# Patient Record
Sex: Male | Born: 1935 | Race: White | Hispanic: No | Smoking: Former smoker
Health system: Southern US, Community
[De-identification: ages and names within clinical notes are randomized; demographics above are authoritative.]

## PROBLEM LIST (undated history)

## (undated) DIAGNOSIS — I5042 Chronic combined systolic (congestive) and diastolic (congestive) heart failure: Secondary | ICD-10-CM

## (undated) DIAGNOSIS — T8859XA Other complications of anesthesia, initial encounter: Secondary | ICD-10-CM

## (undated) DIAGNOSIS — F419 Anxiety disorder, unspecified: Secondary | ICD-10-CM

## (undated) DIAGNOSIS — I779 Disorder of arteries and arterioles, unspecified: Secondary | ICD-10-CM

## (undated) DIAGNOSIS — J189 Pneumonia, unspecified organism: Secondary | ICD-10-CM

## (undated) DIAGNOSIS — G518 Other disorders of facial nerve: Secondary | ICD-10-CM

## (undated) DIAGNOSIS — M1990 Unspecified osteoarthritis, unspecified site: Secondary | ICD-10-CM

## (undated) DIAGNOSIS — C4490 Unspecified malignant neoplasm of skin, unspecified: Secondary | ICD-10-CM

## (undated) DIAGNOSIS — I739 Peripheral vascular disease, unspecified: Secondary | ICD-10-CM

## (undated) DIAGNOSIS — I255 Ischemic cardiomyopathy: Secondary | ICD-10-CM

## (undated) DIAGNOSIS — I219 Acute myocardial infarction, unspecified: Secondary | ICD-10-CM

## (undated) DIAGNOSIS — C4331 Malignant melanoma of nose: Secondary | ICD-10-CM

## (undated) DIAGNOSIS — T4145XA Adverse effect of unspecified anesthetic, initial encounter: Secondary | ICD-10-CM

## (undated) DIAGNOSIS — A770 Spotted fever due to Rickettsia rickettsii: Secondary | ICD-10-CM

## (undated) DIAGNOSIS — E119 Type 2 diabetes mellitus without complications: Secondary | ICD-10-CM

## (undated) DIAGNOSIS — Z9289 Personal history of other medical treatment: Secondary | ICD-10-CM

## (undated) DIAGNOSIS — I10 Essential (primary) hypertension: Secondary | ICD-10-CM

## (undated) DIAGNOSIS — I2510 Atherosclerotic heart disease of native coronary artery without angina pectoris: Secondary | ICD-10-CM

## (undated) DIAGNOSIS — E785 Hyperlipidemia, unspecified: Secondary | ICD-10-CM

## (undated) HISTORY — PX: BACK SURGERY: SHX140

## (undated) HISTORY — PX: APPENDECTOMY: SHX54

## (undated) HISTORY — PX: CATARACT EXTRACTION, BILATERAL: SHX1313

## (undated) HISTORY — PX: MELANOMA EXCISION: SHX5266

## (undated) HISTORY — DX: Hyperlipidemia, unspecified: E78.5

## (undated) HISTORY — PX: JOINT REPLACEMENT: SHX530

## (undated) HISTORY — PX: TONSILLECTOMY: SUR1361

## (undated) HISTORY — PX: CYSTOSCOPY W/ URETERAL STENT PLACEMENT: SHX1429

---
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
## 1990-12-29 HISTORY — PX: LUMBAR DISC SURGERY: SHX700

## 1998-12-29 HISTORY — PX: CORONARY ANGIOPLASTY WITH STENT PLACEMENT: SHX49

## 1999-05-12 ENCOUNTER — Inpatient Hospital Stay (HOSPITAL_COMMUNITY): Admission: EM | Admit: 1999-05-12 | Discharge: 1999-05-15 | Payer: Self-pay | Admitting: Cardiology

## 2002-01-15 ENCOUNTER — Emergency Department (HOSPITAL_COMMUNITY): Admission: EM | Admit: 2002-01-15 | Discharge: 2002-01-16 | Payer: Self-pay | Admitting: Internal Medicine

## 2002-01-15 ENCOUNTER — Encounter: Payer: Self-pay | Admitting: Internal Medicine

## 2002-01-20 ENCOUNTER — Encounter: Payer: Self-pay | Admitting: Internal Medicine

## 2002-01-20 ENCOUNTER — Ambulatory Visit (HOSPITAL_COMMUNITY): Admission: RE | Admit: 2002-01-20 | Discharge: 2002-01-20 | Payer: Self-pay | Admitting: Internal Medicine

## 2002-11-21 ENCOUNTER — Encounter (HOSPITAL_COMMUNITY): Admission: RE | Admit: 2002-11-21 | Discharge: 2002-12-21 | Payer: Self-pay | Admitting: Cardiology

## 2003-04-22 ENCOUNTER — Encounter: Payer: Self-pay | Admitting: Emergency Medicine

## 2003-04-22 ENCOUNTER — Emergency Department (HOSPITAL_COMMUNITY): Admission: EM | Admit: 2003-04-22 | Discharge: 2003-04-22 | Payer: Self-pay | Admitting: Emergency Medicine

## 2003-05-01 ENCOUNTER — Encounter: Payer: Self-pay | Admitting: Emergency Medicine

## 2003-05-01 ENCOUNTER — Emergency Department (HOSPITAL_COMMUNITY): Admission: EM | Admit: 2003-05-01 | Discharge: 2003-05-01 | Payer: Self-pay | Admitting: Emergency Medicine

## 2003-05-04 ENCOUNTER — Encounter: Payer: Self-pay | Admitting: Urology

## 2003-05-04 ENCOUNTER — Observation Stay (HOSPITAL_COMMUNITY): Admission: RE | Admit: 2003-05-04 | Discharge: 2003-05-05 | Payer: Self-pay | Admitting: Urology

## 2003-07-12 ENCOUNTER — Ambulatory Visit (HOSPITAL_COMMUNITY): Admission: RE | Admit: 2003-07-12 | Discharge: 2003-07-12 | Payer: Self-pay | Admitting: Urology

## 2003-07-12 ENCOUNTER — Encounter: Payer: Self-pay | Admitting: Urology

## 2004-10-01 ENCOUNTER — Ambulatory Visit (HOSPITAL_COMMUNITY): Admission: RE | Admit: 2004-10-01 | Discharge: 2004-10-01 | Payer: Self-pay | Admitting: Urology

## 2005-05-05 ENCOUNTER — Ambulatory Visit: Payer: Self-pay | Admitting: Internal Medicine

## 2005-05-05 ENCOUNTER — Ambulatory Visit (HOSPITAL_COMMUNITY): Admission: RE | Admit: 2005-05-05 | Discharge: 2005-05-05 | Payer: Self-pay | Admitting: Internal Medicine

## 2005-09-25 ENCOUNTER — Ambulatory Visit: Payer: Self-pay | Admitting: Cardiology

## 2005-10-03 ENCOUNTER — Ambulatory Visit: Payer: Self-pay | Admitting: Cardiology

## 2005-10-03 ENCOUNTER — Ambulatory Visit (HOSPITAL_COMMUNITY): Admission: RE | Admit: 2005-10-03 | Discharge: 2005-10-03 | Payer: Self-pay | Admitting: Cardiology

## 2005-10-16 ENCOUNTER — Ambulatory Visit: Payer: Self-pay | Admitting: Cardiology

## 2006-04-02 ENCOUNTER — Ambulatory Visit (HOSPITAL_COMMUNITY): Admission: RE | Admit: 2006-04-02 | Discharge: 2006-04-02 | Payer: Self-pay | Admitting: Internal Medicine

## 2007-03-30 ENCOUNTER — Ambulatory Visit (HOSPITAL_COMMUNITY): Admission: RE | Admit: 2007-03-30 | Discharge: 2007-03-30 | Payer: Self-pay | Admitting: Internal Medicine

## 2009-04-10 ENCOUNTER — Ambulatory Visit (HOSPITAL_COMMUNITY): Admission: RE | Admit: 2009-04-10 | Discharge: 2009-04-10 | Payer: Self-pay | Admitting: Internal Medicine

## 2009-10-11 ENCOUNTER — Emergency Department (HOSPITAL_COMMUNITY): Admission: EM | Admit: 2009-10-11 | Discharge: 2009-10-11 | Payer: Self-pay | Admitting: Emergency Medicine

## 2010-04-15 ENCOUNTER — Ambulatory Visit (HOSPITAL_COMMUNITY): Admission: RE | Admit: 2010-04-15 | Discharge: 2010-04-15 | Payer: Self-pay | Admitting: Internal Medicine

## 2011-05-16 NOTE — Op Note (Signed)
NAME:  Jimmy Knox, Jimmy Knox                       ACCOUNT NO.:  [id number]   MEDICAL RECORD NO.:  [id number]                   PATIENT TYPE:  OBV   LOCATION:  A338                                 FACILITY:  [facility]   PHYSICIAN:  Dennie Maizes, M.D.                DATE OF BIRTH:  04-13-1932   DATE OF PROCEDURE:  DATE OF DISCHARGE:                                 OPERATIVE REPORT   PREOPERATIVE DIAGNOSES:  Right distal ureteral calculus with obstruction,  right hydronephrosis, right renal colic.   POSTOPERATIVE DIAGNOSES:  Right distal ureteral calculus with obstruction,  right hydronephrosis, right renal colic.   PROCEDURE:  1. Cystoscopy.  2. Right retrograde pyelogram.  3. Right ureteroscopic stone extraction.  4. Right ureteral stent placement.   SURGEON:  Dennie Maizes, M.D.   ANESTHESIA:  Spinal   COMPLICATIONS:  None   DRAINS:  6 [place], 26-cm size right ureteral stent with a string; 16 French  Foley catheter in the bladder.   INDICATIONS FOR PROCEDURE:  This 77 year old male has a history of recurrent  urolithiasis.  He had severe right flank pain due to an obstructing 4-mm  size right ureteral calculus.  He was taken to the OR today for cystoscopy,  right retrograde pyelogram, ureteroscopic stone extraction, and ureteral  stent placement.   DESCRIPTION OF PROCEDURE:  Spinal anesthesia was induced and the patient was  placed on the OR table in the dorsal lithotomy position.  The lower abdomen  and genitalia were prepped and draped in a sterile fashion.  Cystoscopy was  done with the 25 [place] scope.  The patient had moderate hypertrophy  involving both lateral lobes of the prostate with partial obstruction of the  bladder neck area.  The bladder was normal.  The trigone, ureteral orifices,  and bladder mucosa were unremarkable.   A 5 French wedge catheter was then placed in the right ureteral orifice.  About 7 cc of Renografin 60 was injected into the collecting  system and a  retrograde pyelogram was done with fluoroscopy.  This revealed a filling  defect about 5 mm in size in the distal ureter.  The ureter as well as  pelvicalyceal system were found to be moderately dilated.   A 5 French open-ended catheter was then placed in the right ureteral  orifice.  A 0.038-inch Benson guidewire with the flexible tip was then  advanced into the renal pelvis.  The distal ureter was then dilated using an  40 French balloon dilating catheter.  The guidewire was left in place and  the catheter was removed.   A rigid 8 French ureteroscope was then inserted into the distal ureter.  The  stone was seen at about 2 cm above the ureteral orifice.  The stone was  extracted with a 4-mm size wire basket. During this process the guidewire  came out of the ureter.  I had difficulty in placing the guidewire  into the  renal pelvis.  The guidewire was placed up to the midureter.  A 4-to-5-cm  size ureteral access sheath was then inserted.  A flexible 7 French side  ureteroscope was then inserted into the midureter.  The guidewire was then  placed into the renal pelvis under direct vision.   The ureteroscope was then removed.  A 5 French open-ended catheter was then  passed over the guidewire into the renal pelvis.  Contrast was injected and  it was confirmed that the tip of the catheter was inside the renal pelvis.  The ureteral access sheath was then removed.  The guidewire was then  repositioned into the right renal pelvis.  A 6 French 26-cm size stent with  a string was then inserted into the right collecting system.  A 16 French  Foley catheter was inserted into the bladder and connected to a bag. The  patient tolerated the procedure well.  He was transferred to the PACU in a  satisfactory condition.                                               Dennie Maizes, M.D.    SK/MEDQ  D:  05/04/2003  T:  05/04/2003  Job:  [id number]   cc:   Kingsley Callander. Ouida Sills, M.D.  [address]  [place]  [place] [address]  Fax: [phone]

## 2011-05-16 NOTE — Procedures (Signed)
   NAME:  Jimmy Knox, Jimmy Knox NO.:  [id number]   MEDICAL RECORD NO.:  [id number]                   PATIENT TYPE:   LOCATION:                                       FACILITY:   PHYSICIAN:  Llano Bing, M.D. [facility]           DATE OF BIRTH:   DATE OF PROCEDURE:  11/21/2002  DATE OF DISCHARGE:                                  ECHOCARDIOGRAM   STRESS ECHOCARDIOGRAM:   CLINICAL DATA:  This 77 year old gentleman with --   Dictation ended at this point.                                               Forsyth Bing, M.D. [facility]    RR/MEDQ  D:  11/22/2002  T:  11/22/2002  Job:  [id number]

## 2011-05-16 NOTE — Procedures (Signed)
NAME:  Jimmy Knox, Jimmy Knox NO.:  [id number]   MEDICAL RECORD NO.:  [id number]          PATIENT TYPE:  OUT   LOCATION:  RAD                           FACILITY:  [facility]   PHYSICIAN:  Carlton Bing, M.D. [facility] OF BIRTH:  Jun 05, 1932   DATE OF PROCEDURE:  DATE OF DISCHARGE:                                  ECHOCARDIOGRAM   REFERRING:  Dr. Ouida Sills   PROCEDURE:  Stress echocardiogram.   CLINICAL DATA:  A 77 year old gentleman with coronary disease, having  undergone percutaneous intervention for a total obstruction of the LAD in  2000.  Now presents with chest discomfort.   1.  Treadmill exercise performed to a workload of seven mets and a heart      rate of 149, 100% of age-predicted maximum.  Exercise discontinued due      to fatigue; no chest pain reported.  2.  Blood pressure increased from a resting value of 150/82 to 180/70 at      peak exercise, a normal response.  3.  No important arrhythmias - few PVCs.  4.  EKG:  Normal sinus rhythm; nondiagnostic inferior Q-waves with slight J-      point elevation; prior anterior infarction.  5.  Stress EKG:  Slowly upsloping ST-segment depression of 0.5-1 mm in the      inferior leads as well as V5-V6.  6.  Baseline echocardiogram:  Normal chamber dimensions; normal right      ventricular function; no significant valvular abnormalities - mild      aortic valvular sclerosis and mitral annular calcification.  Mild left      ventricular dimensions with no substantial hypertrophy but some      thickening of the proximal septum; slight hypokinesis of the distal      septum and apex with a low-normal overall capital LV systolic function.  7.  Post-exercise echocardiogram:  Hyperdynamic function in all regions;      image quality was suboptimal, but utilizing all views allows every      myocardial segment to be assessed.   IMPRESSION:  Negative stress echocardiogram, revealing minor baseline wall  motion abnormalities with  borderline left ventricular systolic function as  described, somewhat impaired exercise capacity, borderline  electrocardiographic changes but no echocardiographic evidence for  myocardial ischemia.  No angina.  Other findings as noted.      Kamiah Bing, M.D. [facility]  Electronically Signed     RR/MEDQ  D:  10/06/2005  T:  10/06/2005  Job:  [id number]

## 2011-05-16 NOTE — Procedures (Signed)
NAME:  Jimmy Knox, DEARMAS NO.:  [id number]   MEDICAL RECORD NO.:  [id number]          PATIENT TYPE:  OUT   LOCATION:  RAD                           FACILITY:  [facility]   PHYSICIAN:  Vida Roller, M.D.   DATE OF BIRTH:  1932-09-22   DATE OF PROCEDURE:  10/03/2005  DATE OF DISCHARGE:                                    STRESS TEST   HISTORY:  Mr. Celmer is a 77 year old gentleman with coronary disease,  status post percutaneous intervention in 2000, for a totally occluded LAD.  Cardiolite in 2003, revealed a questionable area of ischemia that was very  small at the apex and an EF of 51%.   BASELINE DATA:  Electrocardiogram reveals a sinus rhythm at 66 beats per  minute with abnormal R-wave progression, old inferior myocardial infarction  and the blood pressure is 152/80.   The patient exercised for a total of 6 minutes of 22 seconds at Bruce  protocol stage III at 7.0 METS.  Maximum heart rate 149 which is 100%  predicted maximum.  Maximum blood pressure is 180/72 and resolved down to  148/72 in recovery.  Electrocardiogram revealed some ST depression and T-  wave inversion in leads I, II, aVF, V4-V6 and few PACs were noted.   Final images and results are pending M.D. review.  The patient did not have  any chest discomfort or shortness of breath with exercise.      Jae Dire, P.A. [facility]      Vida Roller, M.D.  Electronically Signed    AB/MEDQ  D:  10/03/2005  T:  10/03/2005  Job:  [id number]

## 2011-05-16 NOTE — H&P (Signed)
NAME:  Jimmy Knox, Jimmy Knox                       ACCOUNT NO.:  [id number]   MEDICAL RECORD NO.:  [id number]                   PATIENT TYPE:  AMB   LOCATION:  DAY                                  FACILITY:  [facility]   PHYSICIAN:  Dennie Maizes, M.D.                DATE OF BIRTH:  1932-10-21   DATE OF ADMISSION:  05/04/2003  DATE OF DISCHARGE:                                HISTORY & PHYSICAL   CHIEF COMPLAINT:  Severe right flank pain radiating to the front, right  ureteral calculus with obstruction.   HISTORY OF PRESENT ILLNESS:  This 77 year old male was referred to me by Kingsley Callander. Ouida Sills, M.D.  The patient has a past history of recurrent urolithiasis for  about 20 years.  He has passed about eight stones so far.  He experienced  sudden onset of severe right flank pain radiating to the front on 04/22/03.  This was associated with nausea.  He did not have any fever, chills, voiding  difficulty, or gross hematuria.  He went to the emergency room at [facility] for further evaluation.  A noncontrast spiral CT scan of the  abdomen and pelvis was done.  This revealed bilateral small renal calculi  which were not obstructing.  There was a 4 mm size stone at the right  ureteropelvic junction with obstruction.  The patient did not pass the  stone.  He had relief of pain at that time.   He had recurrent severe pain on 05/01/03.  He went back to the emergency room  and the CT scan was repeated.  This revealed the 4 mm size stone had moved  toward the right distal ureter.  The patient continues to have _______  intermittent pain.  He is brought to the day hospital today for cystoscopy,  right retrograde pyelogram, ureteroscopy, stone extraction, and stent  placement.   PAST MEDICAL HISTORY:  History of recurrent ureterolithiasis, hypertension,  and elevated cholesterol, coronary artery disease status post MI, status  post cardiac stent placement x3 in 2002, status post back surgery in  1990.   MEDICATIONS:  1. Toprol XL 25 mg one p.o. daily.  2. Lipitor 10 mg one p.o. daily.  3. Diovan 160 mg one p.o. daily.  4. Aspirin one p.o. daily, which has been stopped for the surgery.   ALLERGIES:  No known drug allergies.   FAMILY HISTORY:  Positive for Alzheimer's disease and bladder carcinoma.   PHYSICAL EXAMINATION:  HEENT:  Normal.  NECK:  No masses.  CHEST:  Lungs clear to auscultation.  CARDIAC:  Regular rate and rhythm, no murmurs.  ABDOMEN:  Soft, no palpable flank mass.  Mild right costovertebral angle  tenderness was noted.  No suprapubic tenderness or bladder distention.  GENITOURINARY:  Penis and testes are normal.  RECTAL:  Benign prostate.    IMPRESSION:  1. Right distal ureteral calculus with obstruction.  2. Mild  right hydronephrosis.  3. Right renal colic.  4. Bilateral renal calculi.   PLAN:  I have discussed with the patient and his wife regarding the  diagnosis and management options.  He would like to proceed with stone  removal.  He is scheduled to undergo cystoscopy, right retrograde pyelogram,  ureteroscopy, stone extraction, and stent placement under anesthesia on  05/04/03 at [facility].  I have discussed with him regarding the  diagnosis, operative details, outcome, alternative treatments, possible  risks and complications, and he has agreed for the procedure to be done.  Stone migration during attempted removal has been explained to the patient.                                               Dennie Maizes, M.D.    SK/MEDQ  D:  05/04/2003  T:  05/04/2003  Job:  [id number]   cc:   Kingsley Callander. Ouida Sills, M.D.  [address]  [place]  [place] [address]  Fax: [phone]

## 2011-05-16 NOTE — Op Note (Signed)
NAME:  BRETTON, TANDY NO.:  [id number]   MEDICAL RECORD NO.:  [id number]          PATIENT TYPE:  AMB   LOCATION:  DAY                           FACILITY:  [facility]   PHYSICIAN:  Lionel December, M.D.    DATE OF BIRTH:  September 15, 1932   DATE OF PROCEDURE:  05/05/2005  DATE OF DISCHARGE:                                 OPERATIVE REPORT   PROCEDURE:  Colonoscopy.   INDICATION:  Chrystian is a 77 year old Caucasian male who had three polyps  removed from his colon in July 30, 2000, and two of these were adenomatous.  He is therefore returning for surveillance exam.  He does not have any GI  symptoms.  The procedure risks were reviewed the patient, informed consent  was obtained.   PREMEDICATION:  Demerol 25 mg IV, Versed 4 mg IV.   FINDINGS:  Procedure performed in endoscopy suite.  The patient's vital  signs and O2 saturation were monitored during procedure and remained stable.  The patient was placed in the left lateral recumbent position and rectal  examination performed.  No abnormality noted on external or digital exam.  The Olympus video scope was placed in the rectum and advanced under vision  in sigmoid colon and beyond.  Few tiny scattered diverticula were noted  sigmoid and descending colon.  The scope was passed to the cecum, which was  identified by ileocecal valve.  Blunt cecum was also well-seen and was  normal.  Pictures taken for the record.  As the scope was withdrawn, colonic  mucosa was examined for the second time and there were no polyps or other  mucosal abnormalities.  Rectal mucosa similarly was normal.  Scope was  retroflexed to examine anorectal junction, which was unremarkable.  Endoscope was straightened and withdrawn.  The patient tolerated the  procedure well.   FINAL DIAGNOSIS:  A few tiny diverticula in sigmoid and descending colon,  otherwise normal exam.   RECOMMENDATIONS:  1.  He will resume his usual medications.  2.  He should continue  yearly Hemoccults and consider next exam in five      years from now.      NR/MEDQ  D:  05/05/2005  T:  05/05/2005  Job:  [id number]   cc:   Kingsley Callander. Ouida Sills, MD  [address]  [place]  [place] [address]  Fax: [phone]

## 2011-12-30 HISTORY — PX: EYE SURGERY: SHX253

## 2012-01-09 DIAGNOSIS — E119 Type 2 diabetes mellitus without complications: Secondary | ICD-10-CM | POA: Diagnosis not present

## 2012-01-16 DIAGNOSIS — I10 Essential (primary) hypertension: Secondary | ICD-10-CM | POA: Diagnosis not present

## 2012-01-16 DIAGNOSIS — E119 Type 2 diabetes mellitus without complications: Secondary | ICD-10-CM | POA: Diagnosis not present

## 2012-01-23 DIAGNOSIS — E119 Type 2 diabetes mellitus without complications: Secondary | ICD-10-CM | POA: Diagnosis not present

## 2012-01-23 DIAGNOSIS — Z961 Presence of intraocular lens: Secondary | ICD-10-CM | POA: Diagnosis not present

## 2012-01-23 DIAGNOSIS — H47239 Glaucomatous optic atrophy, unspecified eye: Secondary | ICD-10-CM | POA: Diagnosis not present

## 2012-02-24 DIAGNOSIS — L821 Other seborrheic keratosis: Secondary | ICD-10-CM | POA: Diagnosis not present

## 2012-02-24 DIAGNOSIS — D1801 Hemangioma of skin and subcutaneous tissue: Secondary | ICD-10-CM | POA: Diagnosis not present

## 2012-02-24 DIAGNOSIS — L570 Actinic keratosis: Secondary | ICD-10-CM | POA: Diagnosis not present

## 2012-02-24 DIAGNOSIS — L819 Disorder of pigmentation, unspecified: Secondary | ICD-10-CM | POA: Diagnosis not present

## 2012-02-24 DIAGNOSIS — C44621 Squamous cell carcinoma of skin of unspecified upper limb, including shoulder: Secondary | ICD-10-CM | POA: Diagnosis not present

## 2012-02-24 DIAGNOSIS — Z0189 Encounter for other specified special examinations: Secondary | ICD-10-CM | POA: Diagnosis not present

## 2012-03-23 DIAGNOSIS — L570 Actinic keratosis: Secondary | ICD-10-CM | POA: Diagnosis not present

## 2012-03-23 DIAGNOSIS — L905 Scar conditions and fibrosis of skin: Secondary | ICD-10-CM | POA: Diagnosis not present

## 2012-03-23 DIAGNOSIS — C44621 Squamous cell carcinoma of skin of unspecified upper limb, including shoulder: Secondary | ICD-10-CM | POA: Diagnosis not present

## 2012-03-23 DIAGNOSIS — L738 Other specified follicular disorders: Secondary | ICD-10-CM | POA: Diagnosis not present

## 2012-05-03 DIAGNOSIS — I10 Essential (primary) hypertension: Secondary | ICD-10-CM | POA: Diagnosis not present

## 2012-05-03 DIAGNOSIS — E119 Type 2 diabetes mellitus without complications: Secondary | ICD-10-CM | POA: Diagnosis not present

## 2012-05-03 DIAGNOSIS — Z79899 Other long term (current) drug therapy: Secondary | ICD-10-CM | POA: Diagnosis not present

## 2012-05-03 DIAGNOSIS — E785 Hyperlipidemia, unspecified: Secondary | ICD-10-CM | POA: Diagnosis not present

## 2012-05-10 DIAGNOSIS — E785 Hyperlipidemia, unspecified: Secondary | ICD-10-CM | POA: Diagnosis not present

## 2012-05-10 DIAGNOSIS — I10 Essential (primary) hypertension: Secondary | ICD-10-CM | POA: Diagnosis not present

## 2012-05-10 DIAGNOSIS — E119 Type 2 diabetes mellitus without complications: Secondary | ICD-10-CM | POA: Diagnosis not present

## 2012-05-10 DIAGNOSIS — Z1212 Encounter for screening for malignant neoplasm of rectum: Secondary | ICD-10-CM | POA: Diagnosis not present

## 2012-05-10 DIAGNOSIS — I2510 Atherosclerotic heart disease of native coronary artery without angina pectoris: Secondary | ICD-10-CM | POA: Diagnosis not present

## 2012-07-23 DIAGNOSIS — H47239 Glaucomatous optic atrophy, unspecified eye: Secondary | ICD-10-CM | POA: Diagnosis not present

## 2012-07-23 DIAGNOSIS — E119 Type 2 diabetes mellitus without complications: Secondary | ICD-10-CM | POA: Diagnosis not present

## 2012-07-23 DIAGNOSIS — Z961 Presence of intraocular lens: Secondary | ICD-10-CM | POA: Diagnosis not present

## 2012-09-06 DIAGNOSIS — E119 Type 2 diabetes mellitus without complications: Secondary | ICD-10-CM | POA: Diagnosis not present

## 2012-09-14 DIAGNOSIS — E119 Type 2 diabetes mellitus without complications: Secondary | ICD-10-CM | POA: Diagnosis not present

## 2012-09-14 DIAGNOSIS — I2510 Atherosclerotic heart disease of native coronary artery without angina pectoris: Secondary | ICD-10-CM | POA: Diagnosis not present

## 2012-10-11 DIAGNOSIS — Z23 Encounter for immunization: Secondary | ICD-10-CM | POA: Diagnosis not present

## 2013-01-17 DIAGNOSIS — E119 Type 2 diabetes mellitus without complications: Secondary | ICD-10-CM | POA: Diagnosis not present

## 2013-01-24 DIAGNOSIS — I10 Essential (primary) hypertension: Secondary | ICD-10-CM | POA: Diagnosis not present

## 2013-01-24 DIAGNOSIS — E119 Type 2 diabetes mellitus without complications: Secondary | ICD-10-CM | POA: Diagnosis not present

## 2013-02-10 ENCOUNTER — Encounter (HOSPITAL_COMMUNITY): Payer: Self-pay | Admitting: Emergency Medicine

## 2013-02-10 ENCOUNTER — Other Ambulatory Visit: Payer: Self-pay

## 2013-02-10 ENCOUNTER — Emergency Department (HOSPITAL_COMMUNITY): Payer: Medicare Other

## 2013-02-10 ENCOUNTER — Observation Stay (HOSPITAL_COMMUNITY)
Admission: EM | Admit: 2013-02-10 | Discharge: 2013-02-11 | Disposition: A | Discharge status: Home / Self Care | Payer: Medicare Other | Attending: Internal Medicine | Admitting: Internal Medicine

## 2013-02-10 DIAGNOSIS — I2510 Atherosclerotic heart disease of native coronary artery without angina pectoris: Secondary | ICD-10-CM | POA: Diagnosis not present

## 2013-02-10 DIAGNOSIS — E119 Type 2 diabetes mellitus without complications: Secondary | ICD-10-CM | POA: Diagnosis not present

## 2013-02-10 DIAGNOSIS — R079 Chest pain, unspecified: Principal | ICD-10-CM | POA: Insufficient documentation

## 2013-02-10 DIAGNOSIS — R6889 Other general symptoms and signs: Secondary | ICD-10-CM | POA: Diagnosis not present

## 2013-02-10 DIAGNOSIS — E785 Hyperlipidemia, unspecified: Secondary | ICD-10-CM | POA: Insufficient documentation

## 2013-02-10 DIAGNOSIS — R0789 Other chest pain: Secondary | ICD-10-CM | POA: Diagnosis not present

## 2013-02-10 DIAGNOSIS — I252 Old myocardial infarction: Secondary | ICD-10-CM | POA: Diagnosis not present

## 2013-02-10 HISTORY — DX: Essential (primary) hypertension: I10

## 2013-02-10 LAB — COMPREHENSIVE METABOLIC PANEL
AST: 26 U/L (ref 0–37)
BUN: 18 mg/dL (ref 6–23)
CO2: 25 mEq/L (ref 19–32)
Calcium: 9.5 mg/dL (ref 8.4–10.5)
Creatinine, Ser: 1.06 mg/dL (ref 0.50–1.35)
GFR calc Af Amer: 75 mL/min — ABNORMAL LOW (ref >90)
GFR calc non Af Amer: 65 mL/min — ABNORMAL LOW (ref >90)

## 2013-02-10 LAB — TROPONIN I
Troponin I: <0.3 ng/mL (ref <0.30)
Troponin I: <0.3 ng/mL (ref <0.30)

## 2013-02-10 LAB — CBC WITH DIFFERENTIAL/PLATELET
Basophils Absolute: 0 10*3/uL (ref 0.0–0.1)
Eosinophils Relative: 2 % (ref 0–5)
HCT: 41.5 % (ref 39.0–52.0)
Lymphocytes Relative: 42 % (ref 12–46)
MCHC: 34 g/dL (ref 30.0–36.0)
MCV: 91.4 fL (ref 78.0–100.0)
Monocytes Absolute: 0.5 10*3/uL (ref 0.1–1.0)
Monocytes Relative: 6 % (ref 3–12)
RDW: 13.5 % (ref 11.5–15.5)
WBC: 8.2 10*3/uL (ref 4.0–10.5)

## 2013-02-10 MED ORDER — AMITRIPTYLINE HCL 25 MG PO TABS
50.0000 mg | ORAL_TABLET | Freq: Every day | ORAL | Status: DC
  Administered 2013-02-10: 50 mg via ORAL
  Filled 2013-02-10: qty 2

## 2013-02-10 MED ORDER — ENOXAPARIN SODIUM 40 MG/0.4ML ~~LOC~~ SOLN
40.0000 mg | SUBCUTANEOUS | Status: DC
  Administered 2013-02-10: 40 mg via SUBCUTANEOUS
  Filled 2013-02-10: qty 0.4

## 2013-02-10 MED ORDER — INSULIN ASPART 100 UNIT/ML ~~LOC~~ SOLN
0.0000 [IU] | Freq: Three times a day (TID) | SUBCUTANEOUS | Status: DC
Start: 2013-02-10 — End: 2013-02-11

## 2013-02-10 MED ORDER — SIMVASTATIN 20 MG PO TABS
40.0000 mg | ORAL_TABLET | Freq: Every evening | ORAL | Status: DC
  Administered 2013-02-10: 40 mg via ORAL
  Filled 2013-02-10: qty 2

## 2013-02-10 MED ORDER — ASPIRIN 81 MG PO CHEW
243.0000 mg | CHEWABLE_TABLET | Freq: Once | ORAL | Status: AC
  Administered 2013-02-10: 243 mg via ORAL
  Filled 2013-02-10: qty 3

## 2013-02-10 MED ORDER — SODIUM CHLORIDE 0.9 % IJ SOLN
3.0000 mL | Freq: Two times a day (BID) | INTRAMUSCULAR | Status: DC
  Administered 2013-02-10 – 2013-02-11 (×2): 3 mL via INTRAVENOUS

## 2013-02-10 MED ORDER — NITROGLYCERIN 0.4 MG SL SUBL
0.4000 mg | SUBLINGUAL_TABLET | SUBLINGUAL | Status: DC | PRN
  Administered 2013-02-10: 0.4 mg via SUBLINGUAL
  Filled 2013-02-10 (×2): qty 25

## 2013-02-10 MED ORDER — ONDANSETRON HCL 4 MG/2ML IJ SOLN: 4.0000 mg | Freq: Four times a day (QID) | INTRAMUSCULAR | Status: DC | PRN

## 2013-02-10 MED ORDER — INSULIN ASPART 100 UNIT/ML ~~LOC~~ SOLN: 0.0000 [IU] | Freq: Every day | SUBCUTANEOUS | Status: DC

## 2013-02-10 MED ORDER — LOSARTAN POTASSIUM 50 MG PO TABS
50.0000 mg | ORAL_TABLET | Freq: Every day | ORAL | Status: DC
  Administered 2013-02-10 – 2013-02-11 (×2): 50 mg via ORAL
  Filled 2013-02-10 (×2): qty 1

## 2013-02-10 MED ORDER — SODIUM CHLORIDE 0.9 % IV SOLN: 250.0000 mL | INTRAVENOUS | Status: DC | PRN

## 2013-02-10 MED ORDER — ONDANSETRON HCL 4 MG PO TABS: 4.0000 mg | ORAL_TABLET | Freq: Four times a day (QID) | ORAL | Status: DC | PRN

## 2013-02-10 MED ORDER — ACETAMINOPHEN 650 MG RE SUPP: 650.0000 mg | Freq: Four times a day (QID) | RECTAL | Status: DC | PRN

## 2013-02-10 MED ORDER — ASPIRIN 81 MG PO CHEW
81.0000 mg | CHEWABLE_TABLET | Freq: Every day | ORAL | Status: DC
  Administered 2013-02-11: 81 mg via ORAL
  Filled 2013-02-10: qty 1

## 2013-02-10 MED ORDER — ALUM & MAG HYDROXIDE-SIMETH 200-200-20 MG/5ML PO SUSP: 30.0000 mL | Freq: Four times a day (QID) | ORAL | Status: DC | PRN

## 2013-02-10 MED ORDER — SODIUM CHLORIDE 0.9 % IJ SOLN: 3.0000 mL | INTRAMUSCULAR | Status: DC | PRN

## 2013-02-10 MED ORDER — ACETAMINOPHEN 325 MG PO TABS: 650.0000 mg | ORAL_TABLET | Freq: Four times a day (QID) | ORAL | Status: DC | PRN

## 2013-02-10 MED ORDER — METOPROLOL SUCCINATE ER 25 MG PO TB24
25.0000 mg | ORAL_TABLET | Freq: Every day | ORAL | Status: DC
  Administered 2013-02-11: 25 mg via ORAL
  Filled 2013-02-10: qty 1

## 2013-02-10 NOTE — ED Notes (Signed)
Patient given Ginger Ale per RN approval. 

## 2013-02-10 NOTE — ED Notes (Signed)
Report given to charge RN.

## 2013-02-10 NOTE — Progress Notes (Signed)
CRITICAL VALUE ALERT  Critical value received: troponin .32  Date of notification:  02-10-13  Time of notification: 2330  Critical value read back yes  Nurse who received alert: b Milana Obey   MD notified (1st page):  Mcinnis  Time of first page:  2335  MD notified (2nd page):  Time of second page:  Responding MD:  mcinnis  Time MD responded:  2340

## 2013-02-10 NOTE — ED Provider Notes (Signed)
History     CSN: [id number]  Arrival date & time 02/10/13  1101   First MD Initiated Contact with Patient 02/10/13 1101      Chief Complaint  Patient presents with  . Chest Pain    (Consider location/radiation/quality/duration/timing/severity/associated sxs/prior treatment) HPI Comments: Patient with history of CAD/MI in 2001 presents with tightness in the chest since waking this morning.  He reports he has been doing well since his MI in 2001 and has had no further tests or procedures on his heart.  He is quite active, states that he stacked wood yesterday without symptoms, but did have to take a muscle relaxer last night because his arms were sore.    Patient is a 79 y.o. male presenting with chest pain. The history is provided by the patient.  Chest Pain Pain location:  Substernal area Pain quality: tightness   Pain radiates to:  L arm and R arm Pain radiates to the back: no   Pain severity:  Moderate Onset quality:  Sudden Duration:  5 hours Timing:  Constant Progression:  Worsening Chronicity:  New Context comment:  Woke from sleep with this Relieved by:  Nothing Worsened by:  Nothing tried   Past Medical History  Diagnosis Date  . MI (myocardial infarction)   . Hypertension   . High cholesterol     Past Surgical History  Procedure Laterality Date  . Carotid stent    . Back surgery      History reviewed. No pertinent family history.  History  Substance Use Topics  . Smoking status: Former Smoker -- 0.50 packs/day for 20 years    Types: Cigarettes    Quit date: 12/29/1968  . Smokeless tobacco: Never Used  . Alcohol Use: No      Review of Systems  Cardiovascular: Positive for chest pain.  All other systems reviewed and are negative.    Allergies  Review of patient's allergies indicates no known allergies.  Home Medications  No current outpatient prescriptions on file.  BP 134/78  Resp 20  Ht 6' (1.829 m)  Wt 212 lb (96.163 kg)  BMI 28.75  kg/m2  SpO2 98%  Physical Exam  Nursing note and vitals reviewed. Constitutional: He is oriented to person, place, and time. He appears well-developed and well-nourished. No distress.  HENT:  Head: Normocephalic and atraumatic.  Mouth/Throat: Oropharynx is clear and moist.  Neck: Normal range of motion. Neck supple.  Cardiovascular: Normal rate and regular rhythm.   No murmur heard. Pulmonary/Chest: Effort normal and breath sounds normal. No respiratory distress. He has no wheezes.  Abdominal: Soft. Bowel sounds are normal. He exhibits no distension. There is no tenderness.  Musculoskeletal: Normal range of motion. He exhibits no edema.  Lymphadenopathy:    He has no cervical adenopathy.  Neurological: He is alert and oriented to person, place, and time.  Skin: Skin is warm and dry. He is not diaphoretic.    ED Course  Procedures (including critical care time)  Labs Reviewed  CBC WITH DIFFERENTIAL  COMPREHENSIVE METABOLIC PANEL  TROPONIN I   No results found.   No diagnosis found.   Date: 02/10/2013  Rate: 65  Rhythm: normal sinus rhythm  QRS Axis: normal  Intervals: normal  ST/T Wave abnormalities: normal  Conduction Disutrbances:none  Narrative Interpretation:   Old EKG Reviewed: unchanged    MDM  The patient presents with chest discomfort that improved in the ED with nitrogylcerin.  He has a history of CAD with stent in  2001.  Today's workup shows an unchanged ekg and negative troponin.  I have spoken with Dr. Ouida Sills who will come to the ED to admit the patient.        Geoffery Lyons, MD 02/10/13 1323

## 2013-02-10 NOTE — ED Notes (Signed)
Attempted to call report. Nedine, RN to call back.

## 2013-02-10 NOTE — ED Notes (Signed)
Patient brought in via [facility] for chest pain. Alert and oriented. Airway patent. Patient c/o intermittent mid-sternal chest pain that radiate down arms bilaterally to elbows. Denies any shortness of breath, dizziness, n/v, sweating, or weakness. Patient has hx of cardiac stents. Per patient pain woke him this morning. Reports taking one baby aspirin. Per EMS patient lifting wood yesterday in which he had to take a muscle relaxer for.

## 2013-02-10 NOTE — ED Notes (Signed)
Attempted to call report x 2. Put on hold, no answer.

## 2013-02-10 NOTE — ED Notes (Signed)
Dr Ouida Sills at bedside.

## 2013-02-11 DIAGNOSIS — I2510 Atherosclerotic heart disease of native coronary artery without angina pectoris: Secondary | ICD-10-CM | POA: Diagnosis not present

## 2013-02-11 DIAGNOSIS — I252 Old myocardial infarction: Secondary | ICD-10-CM | POA: Diagnosis not present

## 2013-02-11 DIAGNOSIS — R079 Chest pain, unspecified: Secondary | ICD-10-CM | POA: Diagnosis not present

## 2013-02-11 DIAGNOSIS — E119 Type 2 diabetes mellitus without complications: Secondary | ICD-10-CM | POA: Diagnosis not present

## 2013-02-11 LAB — GLUCOSE, CAPILLARY

## 2013-02-11 LAB — TROPONIN I: Troponin I: 0.32 ng/mL (ref <0.30)

## 2013-02-11 MED ORDER — OMEPRAZOLE 20 MG PO CPDR: 20.0000 mg | DELAYED_RELEASE_CAPSULE | Freq: Every day | ORAL | Status: DC

## 2013-02-11 MED ORDER — NITROGLYCERIN 0.4 MG SL SUBL: 0.4000 mg | SUBLINGUAL_TABLET | SUBLINGUAL | Status: DC | PRN

## 2013-02-11 NOTE — Progress Notes (Signed)
Subjective: He says he feels well. He has not had any chest pain. He is not short of breath  Objective: Vital signs in last 24 hours: Temp:  [97.3 F (36.3 C)-97.6 F (36.4 C)] 97.3 F (36.3 C) (02/14 0525) Pulse Rate:  [46-82] 71 (02/14 0525) Resp:  [18-23] 20 (02/14 0525) BP: (103-126)/(59-80) 120/71 mmHg (02/14 0525) SpO2:  [95 %-97 %] 97 % (02/14 0525) Weight:  [94.9 kg (209 lb 3.5 oz)-95.3 kg (210 lb 1.6 oz)] 94.9 kg (209 lb 3.5 oz) (02/14 0525) Weight change:  Last BM Date: 02/10/13  Intake/Output from previous day: 02/13 0701 - 02/14 0700 In: 480 [P.O.:480] Out: -   PHYSICAL EXAM General appearance: alert, cooperative and no distress Resp: clear to auscultation bilaterally Cardio: regular rate and rhythm, S1, S2 normal, no murmur, click, rub or gallop GI: soft, non-tender; bowel sounds normal; no masses,  no organomegaly Extremities: extremities normal, atraumatic, no cyanosis or edema  Lab Results:    Basic Metabolic Panel:  Recent Labs  54/09/81 1112  NA 137  K 4.1  CL 103  CO2 25  GLUCOSE 115*  BUN 18  CREATININE 1.06  CALCIUM 9.5   Liver Function Tests:  Recent Labs  02/10/13 1112  AST 26  ALT 21  ALKPHOS 83  BILITOT 0.5  PROT 7.3  ALBUMIN 3.8   No results found for this basename: LIPASE, AMYLASE,  in the last 72 hours No results found for this basename: AMMONIA,  in the last 72 hours CBC:  Recent Labs  02/10/13 1112  WBC 8.2  NEUTROABS 4.1  HGB 14.1  HCT 41.5  MCV 91.4  PLT 187   Cardiac Enzymes:  Recent Labs  02/10/13 1635 02/10/13 2231 02/11/13 0351  TROPONINI <0.30 0.32* <0.30   BNP: No results found for this basename: PROBNP,  in the last 72 hours D-Dimer: No results found for this basename: DDIMER,  in the last 72 hours CBG:  Recent Labs  02/10/13 1620 02/10/13 2058 02/11/13 0754  GLUCAP 113* 89 106*   Hemoglobin A1C: No results found for this basename: HGBA1C,  in the last 72 hours Fasting Lipid  Panel: No results found for this basename: CHOL, HDL, LDLCALC, TRIG, CHOLHDL, LDLDIRECT,  in the last 72 hours Thyroid Function Tests: No results found for this basename: TSH, T4TOTAL, FREET4, T3FREE, THYROIDAB,  in the last 72 hours Anemia Panel: No results found for this basename: VITAMINB12, FOLATE, FERRITIN, TIBC, IRON, RETICCTPCT,  in the last 72 hours Coagulation: No results found for this basename: LABPROT, INR,  in the last 72 hours Urine Drug Screen: Drugs of Abuse  No results found for this basename: labopia, cocainscrnur, labbenz, amphetmu, thcu, labbarb    Alcohol Level: No results found for this basename: ETH,  in the last 72 hours Urinalysis: No results found for this basename: COLORURINE, APPERANCEUR, LABSPEC, PHURINE, GLUCOSEU, HGBUR, BILIRUBINUR, KETONESUR, PROTEINUR, UROBILINOGEN, NITRITE, LEUKOCYTESUR,  in the last 72 hours Misc. Labs:  ABGS No results found for this basename: PHART, PCO2, PO2ART, TCO2, HCO3,  in the last 72 hours CULTURES No results found for this or any previous visit (from the past 240 hour(s)). Studies/Results: Dg Chest Port 1 View  02/10/2013  *RADIOLOGY REPORT*  Clinical Data: Chest pain.  Coronary artery disease.  Hypertension.  PORTABLE CHEST - 1 VIEW  Comparison: None.  Findings: Heart size is normal.  Mild peripheral interstitial prominence is seen, likely chronic in etiology.  No evidence of acute infiltrate or pulmonary edema.  No  evidence of pleural effusion.  No mass or lymphadenopathy identified.  IMPRESSION: No active cardiopulmonary disease.   Original Report Authenticated By: Myles Rosenthal, M.D.     Medications:  Prior to Admission:  Prescriptions prior to admission  Medication Sig Dispense Refill  . amitriptyline (ELAVIL) 50 MG tablet Take 50 mg by mouth at bedtime.      Marland Kitchen aspirin 81 MG chewable tablet Chew 81 mg by mouth daily.      Marland Kitchen losartan (COZAAR) 50 MG tablet Take 50 mg by mouth daily.      . metoprolol succinate (TOPROL-XL)  25 MG 24 hr tablet Take 25 mg by mouth daily.      . simvastatin (ZOCOR) 40 MG tablet Take 40 mg by mouth every evening.       Scheduled: . amitriptyline  50 mg Oral QHS  . aspirin  81 mg Oral Daily  . enoxaparin (LOVENOX) injection  40 mg Subcutaneous Q24H  . insulin aspart  0-5 Units Subcutaneous QHS  . insulin aspart  0-9 Units Subcutaneous TID WC  . losartan  50 mg Oral Daily  . metoprolol succinate  25 mg Oral Daily  . simvastatin  40 mg Oral QPM  . sodium chloride  3 mL Intravenous Q12H   Continuous:  UJW:JXBJYN chloride, acetaminophen, acetaminophen, alum & mag hydroxide-simeth, nitroGLYCERIN, ondansetron (ZOFRAN) IV, ondansetron, sodium chloride  Assesment:he was admitted with chest pain. He has one troponin level that was 0.32. He is not having chest pain now. I discussed his situation with Dr. Dietrich Pates who is the on-call cardiologist. We do not have cardiology available here today. She feels he can be safely discharged with followup on 02/14/2013 with her cardiology group. I discussed that with Jimmy Knox and he understands and would like to go home. He is to return if he has more chest pain Active Problems:   * No active hospital problems. *    Plan:discharge today    LOS: 1 day   Jimmy Knox L 02/11/2013, 11:28 AM

## 2013-02-11 NOTE — H&P (Signed)
NAME:  KEMARION, ABBEY NO.:  [id number]  MEDICAL RECORD NO.:  [id number]  LOCATION:  A307                          FACILITY:  [facility]  PHYSICIAN:  Kingsley Callander. Ouida Sills, MD       DATE OF BIRTH:  09-May-1932  DATE OF ADMISSION:  02/10/2013 DATE OF DISCHARGE:  LH                             HISTORY & PHYSICAL   CHIEF COMPLAINT:  Chest pain.  HISTORY OF PRESENT ILLNESS:  This patient is a 79 year old white male with a history of coronary heart disease, who presented to the emergency room after awakening this morning with anterior chest pain radiating to both upper arms.  The pain was as if an elephant was sitting on his chest.  He denied diaphoresis, shortness of breath, nausea, or vomiting. He was given nitroglycerin in the emergency room, which relieved his pain.  His pain had been intermittent over approximately 3.5 hours.  He had been very active yesterday getting in 3 loads of wood according to his wife, who had been rather upset with him about doing this.  He has a history of stent placement, approximately 14 years ago.  He has risk factors of type 2 diabetes and hyperlipidemia.  He is a nonsmoker.  PAST MEDICAL HISTORY: 1. Coronary artery disease. 2. Diabetes. 3. Hyperlipidemia.  MEDICATIONS:  Aspirin 81 mg daily, metoprolol XL 25 mg daily, simvastatin 40 mg daily, losartan 50 mg daily, and amitriptyline 50 mg daily.  ALLERGIES:  No known drug allergies.  SOCIAL HISTORY:  Chews tobacco, but does not smoke, drink alcohol, or use recreational drugs.  He is active.  He lives [place] with his wife.  FAMILY HISTORY:  Remarkable for diabetes in 2 brothers.  REVIEW OF SYSTEMS:  He has had no syncope, cough, fever, recent exertional, chest pain, vomiting, or difficulty eating.  PHYSICAL EXAMINATION:  VITAL SIGNS:  Temperature 97.4, pulse 72, respirations 20, blood pressure 126/80, and oxygen saturation 97%. GENERAL:  Alert, oriented, and in no  distress at the time of my exam in the emergency room. HEENT:  Eyes, nose, and oropharynx are unremarkable. NECK:  Supple with no JVD or carotid bruit. LUNGS:  Basilar rales. HEART:  Regular with no murmurs. CHEST:  No palpable tenderness. ABDOMEN:  Nontender with no organomegaly. EXTREMITIES:  Normal pulses with no clubbing, or edema. NEURO:  Diminished hearing, but otherwise grossly intact. LYMPH NODES:  No cervical or supraclavicular enlargement. SKIN:  Warm and dry.  LABORATORY DATA:  Troponin I less than 0.30.  White count 8.2, hemoglobin 14.1, platelets 187,000.  Sodium 137, potassium 4.1, creatinine 1.06, glucose 115.  Chest x-ray reveals normal heart size with mild interstitial prominence, which is felt to likely be chronic in nature.  No acute infiltrate.  EKG reveals normal sinus rhythm with no acute ischemic changes.  Chest pain with a history of coronary heart disease.  He has no pain at the time of my exam.  Troponin is normal.  An EKG shows no ischemic changes.  IMPRESSION AND PLAN: 1. He will be hospitalized for observation overnight and will have     cardiology consultation tomorrow.  We will continue aspirin, beta  blocker, ARB, and statin. 2. Diabetes will check before meal and at bedtime Accu-Cheks and treat     with sliding scale insulin if needed.  He has been well controlled     with lifestyle modifications. 3. Hyperlipidemia.     Kingsley Callander. Ouida Sills, MD     ROF/MEDQ  D:  02/10/2013  T:  02/11/2013  Job:  [id number]

## 2013-02-11 NOTE — Progress Notes (Signed)
UR Chart Review Completed  

## 2013-02-11 NOTE — Progress Notes (Signed)
Patient discharged home with wife.  [place] to call for appt on Monday since not open today.  Instructed on new prescriptions.  Verbalizes understanding of DC instructions.  IV removed - WNL.  Stable to discharge
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
## 2013-02-14 ENCOUNTER — Encounter: Payer: Self-pay | Admitting: Cardiology

## 2013-02-14 ENCOUNTER — Ambulatory Visit (INDEPENDENT_AMBULATORY_CARE_PROVIDER_SITE_OTHER): Payer: Medicare Other | Admitting: Cardiology

## 2013-02-14 ENCOUNTER — Encounter: Payer: Self-pay | Admitting: *Deleted

## 2013-02-14 VITALS — BP 118/74 | HR 74 | Ht 72.0 in | Wt 212.0 lb

## 2013-02-14 DIAGNOSIS — I10 Essential (primary) hypertension: Secondary | ICD-10-CM | POA: Insufficient documentation

## 2013-02-14 DIAGNOSIS — I7090 Unspecified atherosclerosis: Secondary | ICD-10-CM

## 2013-02-14 DIAGNOSIS — I2510 Atherosclerotic heart disease of native coronary artery without angina pectoris: Secondary | ICD-10-CM

## 2013-02-14 DIAGNOSIS — R7301 Impaired fasting glucose: Secondary | ICD-10-CM

## 2013-02-14 DIAGNOSIS — Z961 Presence of intraocular lens: Secondary | ICD-10-CM | POA: Diagnosis not present

## 2013-02-14 DIAGNOSIS — I679 Cerebrovascular disease, unspecified: Secondary | ICD-10-CM

## 2013-02-14 DIAGNOSIS — H47239 Glaucomatous optic atrophy, unspecified eye: Secondary | ICD-10-CM | POA: Diagnosis not present

## 2013-02-14 DIAGNOSIS — E785 Hyperlipidemia, unspecified: Secondary | ICD-10-CM

## 2013-02-14 DIAGNOSIS — R079 Chest pain, unspecified: Secondary | ICD-10-CM

## 2013-02-14 NOTE — Progress Notes (Deleted)
Name: Jimmy Knox    DOB: [birth year]  Age: 79 y.o.  MR#: [id number]       PCP:  Carylon Perches, MD      Insurance: Payor: MEDICARE  Plan: MEDICARE PART A AND B  Product Type: *No Product type*    CC:   No chief complaint on file.   VS Filed Vitals:   02/14/13 1428  BP: 118/74  Pulse: 74  Height: 6' (1.829 m)  Weight: 212 lb (96.163 kg)    Weights Current Weight  02/14/13 212 lb (96.163 kg)  02/11/13 209 lb 3.5 oz (94.9 kg)    Blood Pressure  BP Readings from Last 3 Encounters:  02/14/13 118/74  02/11/13 120/71     Admit date:  (Not on file) Last encounter with RMR:  Visit date not found   Allergy Review of patient's allergies indicates no known allergies.  Current Outpatient Prescriptions  Medication Sig Dispense Refill  . amitriptyline (ELAVIL) 50 MG tablet Take 50 mg by mouth at bedtime.      Marland Kitchen aspirin 81 MG chewable tablet Chew 81 mg by mouth daily.      Marland Kitchen losartan (COZAAR) 50 MG tablet Take 50 mg by mouth daily.      . metoprolol succinate (TOPROL-XL) 25 MG 24 hr tablet Take 25 mg by mouth daily.      . nitroGLYCERIN (NITROSTAT) 0.4 MG SL tablet Place 1 tablet (0.4 mg total) under the tongue every 5 (five) minutes as needed for chest pain.  25 tablet  12  . omeprazole (PRILOSEC) 20 MG capsule Take 1 capsule (20 mg total) by mouth daily.  30 capsule  12  . simvastatin (ZOCOR) 40 MG tablet Take 40 mg by mouth every evening.       No current facility-administered medications for this visit.    Discontinued Meds:   There are no discontinued medications.  Patient Active Problem List  Diagnosis  . Arteriosclerotic cardiovascular disease (ASCVD)  . Hypertension  . Hyperlipidemia  . Fasting hyperglycemia    LABS @BMET3 @  @CMPRESULT3 @ @CBC3 @  Lipid Panel  No results found for this basename: chol, trig, hdl, cholhdl, vldl, ldlcalc    ABG No results found for this basename: phart, pco2, pco2art, po2, po2art, hco3, tco2, acidbasedef, o2sat     BNP (last 3  results) No results found for this basename: PROBNP,  in the last 8760 hours Cardiac Panel (last 3 results) No results found for this basename: CKTOTAL, CKMB, TROPONINI, RELINDX,  in the last 72 hours  Iron/TIBC/Ferritin No results found for this basename: iron, tibc, ferritin     EKG Orders placed during the hospital encounter of 02/10/13  . ED EKG  . ED EKG  . ED EKG  . EKG 12-LEAD  . ED EKG  . EKG 12-LEAD  . EKG     Prior Assessment and Plan Problem List as of 02/14/2013     ICD-9-CM     Cardiology Problems   Arteriosclerotic cardiovascular disease (ASCVD)   Hypertension   Hyperlipidemia     Other   Fasting hyperglycemia       Imaging: Dg Chest Port 1 View  02/10/2013  *RADIOLOGY REPORT*  Clinical Data: Chest pain.  Coronary artery disease.  Hypertension.  PORTABLE CHEST - 1 VIEW  Comparison: None.  Findings: Heart size is normal.  Mild peripheral interstitial prominence is seen, likely chronic in etiology.  No evidence of acute infiltrate or pulmonary edema.  No evidence of pleural effusion.  No  mass or lymphadenopathy identified.  IMPRESSION: No active cardiopulmonary disease.   Original Report Authenticated By: Myles Rosenthal, M.D.

## 2013-02-14 NOTE — Assessment & Plan Note (Addendum)
Very worrisome symptoms in this gentleman with known coronary artery disease. Cardiac catheterization would certainly have been appropriate when he was hospitalized. Now that he has been free of significant symptoms for the past 4 days, proceeding with a stress test within the next 24 hours is reasonable. Patient favors noninvasive approach, but wife favors coronary angiography. He is advised to use sublingual nitroglycerin if symptoms recur and to summon assistance from EMS. Otherwise, we'll proceed with a stress nuclear study tomorrow.

## 2013-02-14 NOTE — Assessment & Plan Note (Signed)
Hyperglycemia has been modest in the past. Hemoglobin A1c level will be obtained with his next otherwise needed laboratory testing.

## 2013-02-14 NOTE — Assessment & Plan Note (Signed)
Patient has not had obstructive disease of the internal carotid arteries on multiple carotid ultrasound studies in the past, but the most recent of these was in 03/2010. A repeat study will be obtained in the near future.

## 2013-02-14 NOTE — Progress Notes (Signed)
Patient ID: Jimmy Knox, male   DOB: [birth year], 79 y.o.   MRN: [id number]  HPI: Schedule return visit for this very nice gentleman who is been lost to followup for the past 8 years but now returns following a recent hospital admission for chest discomfort. He has been virtually free of symptoms since requiring PCI in approximately 2000. The day of admission he developed mid substernal chest pressure and burning with radiation to both upper arms prompting a call to EMS. Myocardial infarction was essentially ruled out in hospital, but one troponin was minimally elevated. Since returning home, has had one episode of dyspnea and one episode of bilateral hand tingling at a time when he had not been out of the house. He has been free of symptoms for the past 36 hours. He has nitroglycerin at home, but has not used it.  Current Outpatient Prescriptions  Medication Sig Dispense Refill  . amitriptyline (ELAVIL) 50 MG tablet Take 50 mg by mouth at bedtime.      Marland Kitchen aspirin 81 MG chewable tablet Chew 81 mg by mouth daily.      Marland Kitchen losartan (COZAAR) 50 MG tablet Take 50 mg by mouth daily.      . metoprolol succinate (TOPROL-XL) 25 MG 24 hr tablet Take 25 mg by mouth daily.      . nitroGLYCERIN (NITROSTAT) 0.4 MG SL tablet Place 1 tablet (0.4 mg total) under the tongue every 5 (five) minutes as needed for chest pain.  25 tablet  12  . omeprazole (PRILOSEC) 20 MG capsule Take 1 capsule (20 mg total) by mouth daily.  30 capsule  12  . simvastatin (ZOCOR) 40 MG tablet Take 40 mg by mouth every evening.       No current facility-administered medications for this visit.   No Known Allergies   Past medical history, social history, and family history reviewed and updated.  ROS: denies exertional dyspnea, orthopnea, PND, pedal edema or neurologic symptoms. All other systems reviewed and are negative.  PHYSICAL EXAM: BP 118/74  Pulse 74  Ht 6' (1.829 m)  Wt 96.163 kg (212 lb)  BMI 28.75 kg/m2;  Body mass index  is 28.75 kg/(m^2). General-Well developed; no acute distress Body habitus-Mildly overweight Neck-No JVD; left carotid bruit Lungs-clear lung fields; resonant to percussion Cardiovascular-normal PMI; normal S1 and S2 Abdomen-normal bowel sounds; soft and non-tender without masses or organomegaly Musculoskeletal-No deformities, no cyanosis or clubbing Neurologic-Normal cranial nerves; symmetric strength and tone Skin-Warm, no significant lesions Extremities-distal pulses intact; no edema  EKG: normal sinus rhythm; prior inferior myocardial infarction; prior anterolateral myocardial infarction.  EKG obtained in hospital reviewed for comparison. Slight ST segment depression was present in lead 1 and aVL on the prior tracing.  Piqua Bing, MD 02/14/2013  2:53 PM  ASSESSMENT AND PLAN

## 2013-02-14 NOTE — Patient Instructions (Addendum)
Your physician recommends that you schedule a follow-up appointment in: 3 weeks  Stress Test tomorrow

## 2013-02-14 NOTE — Assessment & Plan Note (Signed)
Blood pressure control has been excellent on multiple recent assessments.

## 2013-02-14 NOTE — Assessment & Plan Note (Signed)
No lipid profile available; one will be obtained.

## 2013-02-15 ENCOUNTER — Encounter (HOSPITAL_COMMUNITY)
Admission: RE | Admit: 2013-02-15 | Discharge: 2013-02-15 | Disposition: A | Discharge status: Home / Self Care | Payer: Medicare Other | Source: Ambulatory Visit | Attending: Cardiology | Admitting: Cardiology

## 2013-02-15 ENCOUNTER — Ambulatory Visit (HOSPITAL_COMMUNITY)
Admission: RE | Admit: 2013-02-15 | Discharge: 2013-02-15 | Disposition: A | Discharge status: Home / Self Care | Payer: Medicare Other | Source: Ambulatory Visit | Attending: Cardiology | Admitting: Cardiology

## 2013-02-15 ENCOUNTER — Encounter (HOSPITAL_COMMUNITY): Payer: Self-pay

## 2013-02-15 DIAGNOSIS — I2510 Atherosclerotic heart disease of native coronary artery without angina pectoris: Secondary | ICD-10-CM | POA: Insufficient documentation

## 2013-02-15 DIAGNOSIS — R079 Chest pain, unspecified: Secondary | ICD-10-CM | POA: Insufficient documentation

## 2013-02-15 MED ORDER — REGADENOSON 0.4 MG/5ML IV SOLN
INTRAVENOUS | Status: AC
  Administered 2013-02-15: 0.4 mg via INTRAVENOUS
  Filled 2013-02-15: qty 5

## 2013-02-15 MED ORDER — TECHNETIUM TC 99M SESTAMIBI - CARDIOLITE
30.0000 | Freq: Once | INTRAVENOUS | Status: AC | PRN
  Administered 2013-02-15: 30 via INTRAVENOUS

## 2013-02-15 MED ORDER — TECHNETIUM TC 99M SESTAMIBI - CARDIOLITE
10.0000 | Freq: Once | INTRAVENOUS | Status: AC | PRN
  Administered 2013-02-15: 9.5 via INTRAVENOUS

## 2013-02-15 MED ORDER — SODIUM CHLORIDE 0.9 % IJ SOLN
INTRAMUSCULAR | Status: AC
  Administered 2013-02-15: 10 mL via INTRAVENOUS
  Filled 2013-02-15: qty 10

## 2013-02-15 NOTE — Progress Notes (Signed)
Stress Lab Nurses Notes - Jimmy Knox  Jimmy Knox 02/15/2013 Reason for doing test: CAD and Chest Pain Type of test: Test Changed Lexiscan Cardiolite given unable to reach THR. Nurse performing test: Parke Poisson, RN Nuclear Medicine Tech: Lyndel Pleasure Echo Tech: Not Applicable MD performing test: R. Rothbart Family MD: Ouida Sills Test explained and consent signed: yes IV started: 22g jelco, Saline lock flushed, No redness or edema and Saline lock started in radiology Symptoms: SOB & fatigue Treatment/Intervention: None Reason test stopped: protocol completed After recovery IV was: Discontinued via X-ray tech and No redness or edema Patient to return to Nuc. Med at : 10:30 Patient discharged: Home Patient's Condition upon discharge was: stable Comments: During test BP 109/77 & HR 102.  Recovery BP 100/78 & HR 81.   Symptoms resolved in recovery. Erskine Speed T

## 2013-02-18 ENCOUNTER — Telehealth: Payer: Self-pay | Admitting: Cardiology

## 2013-02-18 NOTE — Telephone Encounter (Signed)
Done.  See testing

## 2013-02-18 NOTE — Telephone Encounter (Signed)
Patient wife calling regarding stress test results.  Please call.

## 2013-03-08 ENCOUNTER — Ambulatory Visit (INDEPENDENT_AMBULATORY_CARE_PROVIDER_SITE_OTHER): Payer: Medicare Other | Admitting: Adult Health

## 2013-03-08 ENCOUNTER — Encounter: Payer: Self-pay | Admitting: Adult Health

## 2013-03-08 VITALS — BP 110/70 | HR 82 | Ht 73.0 in | Wt 211.8 lb

## 2013-03-08 NOTE — Patient Instructions (Addendum)
Your physician recommends that you schedule a follow-up appointment in: with Dr.Rothbart in 3 months   Continue same medication

## 2013-03-08 NOTE — Assessment & Plan Note (Signed)
Discussed test results with the patient and his wife. They verbalize understanding. I have warned them that if he has recurrent chest pain we will plan for cardiac cath. He will start a walking program to increase his stamina. If breathing status worsens we recommend see pulmonologist or have PFTs completed.

## 2013-03-08 NOTE — Progress Notes (Signed)
   HPI: Jimmy Knox is a 79 year old patient of Dr. Dietrich Pates we are seeing on followup for ongoing assessment of treatment with history of CAD. The patient was last seen by Dr. Dietrich Pates on 02/17/2013 after being discharged from the hospital with chest discomfort. The patient was scheduled for a stress test. He is here to discuss results and ongoing symptoms.  Stress test was found to be abnormal revealing impaired exercise capacity, with mild left ventricular dilatation and overall normal left ventricular systolic function. Scintigraphic imaging there is a subtle area of inferior infarction extending inferoseptaly. However there was no significant myocardial ischemia identified. His LVEF was 51%.  He is asymptomatic for chest pain. Continue have mild with exertion. Goes away with rest. Not needed NTG. Wants to increase his stamina.  No Known Allergies  Current Outpatient Prescriptions  Medication Sig Dispense Refill  . amitriptyline (ELAVIL) 50 MG tablet Take 50 mg by mouth at bedtime.      Marland Kitchen aspirin 81 MG chewable tablet Chew 81 mg by mouth daily.      Marland Kitchen losartan (COZAAR) 50 MG tablet Take 50 mg by mouth daily.      . metoprolol succinate (TOPROL-XL) 25 MG 24 hr tablet Take 25 mg by mouth daily.      . nitroGLYCERIN (NITROSTAT) 0.4 MG SL tablet Place 1 tablet (0.4 mg total) under the tongue every 5 (five) minutes as needed for chest pain.  25 tablet  12  . omeprazole (PRILOSEC) 20 MG capsule Take 1 capsule (20 mg total) by mouth daily.  30 capsule  12  . simvastatin (ZOCOR) 40 MG tablet Take 40 mg by mouth every evening.       No current facility-administered medications for this visit.    Past Medical History  Diagnosis Date  . Arteriosclerotic cardiovascular disease (ASCVD)     Stent in approximately 2000  . Hypertension   . Hyperlipidemia   . Fasting hyperglycemia     diet-controlled  . Diabetes mellitus without complication     Past Surgical History  Procedure Laterality  Date  . Carotid stent    . Back surgery      RUE:AVWUJW of systems complete and found to be negative unless listed above  PHYSICAL EXAM General: Well developed, well nourished, in no acute distress Head: Eyes PERRLA, No xanthomas.   Normal cephalic and atramatic  Lungs: Clear bilaterally to auscultation and percussion. Heart: HRRR S1 S2, without MRG.  Pulses are 2+ & equal.            No carotid bruit. No JVD.  No abdominal bruits. No femoral bruits. Abdomen: Bowel sounds are positive, abdomen soft and non-tender without masses or                  Hernia's noted. Msk:  Back normal, normal gait. Normal strength and tone for age. Extremities: No clubbing, cyanosis or edema.  DP +1 Neuro: Alert and oriented X 3. Psych:  Good affect, responds appropriately  EKG:  ASSESSMENT AND PLAN

## 2013-03-08 NOTE — Progress Notes (Deleted)
Name: Jimmy Knox    DOB: [birth year]  Age: 79 y.o.  MR#: [id number]       PCP:  Carylon Perches, MD      Insurance: Payor: MEDICARE  Plan: MEDICARE PART A AND B  Product Type: *No Product type*    CC:    Chief Complaint  Patient presents with  . Coronary Artery Disease  . Hypertension  . Hyperlipidemia    VS Filed Vitals:   03/08/13 1251  BP: 110/70  Pulse: 82  Height: 6\' 1"  (1.854 m)  Weight: 211 lb 12.8 oz (96.072 kg)  SpO2: 95%    Weights Current Weight  03/08/13 211 lb 12.8 oz (96.072 kg)  02/14/13 212 lb (96.163 kg)  02/11/13 209 lb 3.5 oz (94.9 kg)    Blood Pressure  BP Readings from Last 3 Encounters:  03/08/13 110/70  02/14/13 118/74  02/11/13 120/71     Admit date:  (Not on file) Last encounter with RMR:  Visit date not found   Allergy Review of patient's allergies indicates no known allergies.  Current Outpatient Prescriptions  Medication Sig Dispense Refill  . amitriptyline (ELAVIL) 50 MG tablet Take 50 mg by mouth at bedtime.      Marland Kitchen aspirin 81 MG chewable tablet Chew 81 mg by mouth daily.      Marland Kitchen losartan (COZAAR) 50 MG tablet Take 50 mg by mouth daily.      . metoprolol succinate (TOPROL-XL) 25 MG 24 hr tablet Take 25 mg by mouth daily.      . nitroGLYCERIN (NITROSTAT) 0.4 MG SL tablet Place 1 tablet (0.4 mg total) under the tongue every 5 (five) minutes as needed for chest pain.  25 tablet  12  . omeprazole (PRILOSEC) 20 MG capsule Take 1 capsule (20 mg total) by mouth daily.  30 capsule  12  . simvastatin (ZOCOR) 40 MG tablet Take 40 mg by mouth every evening.       No current facility-administered medications for this visit.    Discontinued Meds:   There are no discontinued medications.  Patient Active Problem List  Diagnosis  . Arteriosclerotic cardiovascular disease (ASCVD)  . Hypertension  . Hyperlipidemia  . Fasting hyperglycemia  . Cerebrovascular disease    LABS    Component Value Date/Time   NA 137 02/10/2013 1112   K 4.1  02/10/2013 1112   CL 103 02/10/2013 1112   CO2 25 02/10/2013 1112   GLUCOSE 115* 02/10/2013 1112   BUN 18 02/10/2013 1112   CREATININE 1.06 02/10/2013 1112   CALCIUM 9.5 02/10/2013 1112   GFRNONAA 65* 02/10/2013 1112   GFRAA 75* 02/10/2013 1112   CMP     Component Value Date/Time   NA 137 02/10/2013 1112   K 4.1 02/10/2013 1112   CL 103 02/10/2013 1112   CO2 25 02/10/2013 1112   GLUCOSE 115* 02/10/2013 1112   BUN 18 02/10/2013 1112   CREATININE 1.06 02/10/2013 1112   CALCIUM 9.5 02/10/2013 1112   PROT 7.3 02/10/2013 1112   ALBUMIN 3.8 02/10/2013 1112   AST 26 02/10/2013 1112   ALT 21 02/10/2013 1112   ALKPHOS 83 02/10/2013 1112   BILITOT 0.5 02/10/2013 1112   GFRNONAA 65* 02/10/2013 1112   GFRAA 75* 02/10/2013 1112       Component Value Date/Time   WBC 8.2 02/10/2013 1112   HGB 14.1 02/10/2013 1112   HCT 41.5 02/10/2013 1112   MCV 91.4 02/10/2013 1112    Lipid Panel  No results  found for this basename: chol, trig, hdl, cholhdl, vldl, ldlcalc    ABG No results found for this basename: phart, pco2, pco2art, po2, po2art, hco3, tco2, acidbasedef, o2sat     No results found for this basename: TSH   BNP (last 3 results) No results found for this basename: PROBNP,  in the last 8760 hours Cardiac Panel (last 3 results) No results found for this basename: CKTOTAL, CKMB, TROPONINI, RELINDX,  in the last 72 hours  Iron/TIBC/Ferritin No results found for this basename: iron, tibc, ferritin     EKG Orders placed in visit on 02/14/13  . EKG 12-LEAD     Prior Assessment and Plan Problem List as of 03/08/2013     ICD-9-CM   Arteriosclerotic cardiovascular disease (ASCVD)   Last Assessment & Plan   02/14/2013 Office Visit Edited 02/17/2013  6:06 PM by Kathlen Brunswick, MD     Very worrisome symptoms in this gentleman with known coronary artery disease. Cardiac catheterization would certainly have been appropriate when he was hospitalized. Now that he has been free of significant symptoms for the  past 4 days, proceeding with a stress test within the next 24 hours is reasonable. Patient favors noninvasive approach, but wife favors coronary angiography. He is advised to use sublingual nitroglycerin if symptoms recur and to summon assistance from EMS. Otherwise, we'll proceed with a stress nuclear study tomorrow.    Hypertension   Last Assessment & Plan   02/14/2013 Office Visit Written 02/14/2013  5:37 PM by Kathlen Brunswick, MD     Blood pressure control has been excellent on multiple recent assessments.    Hyperlipidemia   Last Assessment & Plan   02/14/2013 Office Visit Written 02/14/2013  5:36 PM by Kathlen Brunswick, MD     No lipid profile available; one will be obtained.    Fasting hyperglycemia   Last Assessment & Plan   02/14/2013 Office Visit Written 02/14/2013  5:35 PM by Kathlen Brunswick, MD     Hyperglycemia has been modest in the past. Hemoglobin A1c level will be obtained with his next otherwise needed laboratory testing.    Cerebrovascular disease   Last Assessment & Plan   02/14/2013 Office Visit Written 02/14/2013  5:35 PM by Kathlen Brunswick, MD     Patient has not had obstructive disease of the internal carotid arteries on multiple carotid ultrasound studies in the past, but the most recent of these was in 03/2010. A repeat study will be obtained in the near future.        Imaging: Nm Myocar Single W/spect W/wall Motion And Ef  02/16/2013  nm myoview pharmacologic stress  Ordering Physician: [place] Bing  Reading Physician: [place] Bing  Clinical Data: 79 year old gentleman with known coronary artery disease and recent evaluation in the emergency department for chest pain consistent with myocardial ischemia.  NUCLEAR MEDICINE ADENOSINE STRESS MYOVIEW STUDY WITH SPECT AND LEFT VENTRIUCLAR EJECTION FRACTION  Radionuclide Data: One-day rest/stress protocol performed with 10/30 mCi of Tc-1m Myoview.  Stress Data: Initially treadmill exercise was attempted.  The  patient achieved a workload of 4.6 mets and a heart rate of 104 bpm, 73% of age predicted maximum, but was unable to continue due to leg and generalized fatigue and moderate dyspnea. Regadenoson was then administered without much in the way of symptoms. PVCs were noted during exercise as well as one asymptomatic three beat run of ventricular tachycardia.  EKG: Normal sinus rhythm; delayed R-wave progression; cannot exclude previous anterolateral  MI; prior inferior myocardial infarction. Stress EKG: 1-2 mm of ST-segment elevation noted in the inferior leads during exercise as well as 1-2 mm of upsloping ST-segment depression in leads one and aVL. No apparent ECG changes associated with Regadenoson administration.  Scintigraphic Data: Acquisition notable for mild diaphragmatic attenuation.  The left ventricle was mildly dilated.  On tomographic images reconstructed in standard planes, there was a moderate sized defect with moderately to severely depressed tracer uptake involving the inferior, inferoseptal and inferolateral segments.  By comparison to the resting portion of the study, only a trace of inferoapical reversibility was appreciated.  A second small defect involved the distal anterior wall and apex and showed no reversibility.  The gated reconstruction demonstrated moderate to severe hypokinesis of the basilar inferolateral segment as well as mild distal septal hypokinesis.  Overall left ventricular systolic function was preserved with an estimated ejection fraction of 51%.  Systolic accentuation of activity was only minimally decreased in the anteroapical defect but was absent at the base of the inferior wall.  IMPRESSION: Abnormal combined exercise and pharmacologic stress nuclear myocardial study revealing impaired exercise capacity, abnormalities of the stress EKG as described, which are not diagnostic for myocardial ischemia, mild left ventricular dilatation and low normal overall left ventricular  systolic function with segmental wall motion abnormalities as described.  By scintigraphic imaging, there is a sizable area of inferior infarction extending inferoseptally, inferolaterally and inferoapically as well as a small anterior wall scar.  No significant myocardial ischemia identified.  Other findings as noted.   Original Report Authenticated By: [place] Bing    Dg Chest Port 1 View  02/10/2013  *RADIOLOGY REPORT*  Clinical Data: Chest pain.  Coronary artery disease.  Hypertension.  PORTABLE CHEST - 1 VIEW  Comparison: None.  Findings: Heart size is normal.  Mild peripheral interstitial prominence is seen, likely chronic in etiology.  No evidence of acute infiltrate or pulmonary edema.  No evidence of pleural effusion.  No mass or lymphadenopathy identified.  IMPRESSION: No active cardiopulmonary disease.   Original Report Authenticated By: Myles Rosenthal, M.D.

## 2013-03-08 NOTE — Assessment & Plan Note (Signed)
Good control of BP no changes. Rx authoroized for refill.

## 2013-05-13 DIAGNOSIS — Z79899 Other long term (current) drug therapy: Secondary | ICD-10-CM | POA: Diagnosis not present

## 2013-05-13 DIAGNOSIS — E119 Type 2 diabetes mellitus without complications: Secondary | ICD-10-CM | POA: Diagnosis not present

## 2013-05-13 DIAGNOSIS — I10 Essential (primary) hypertension: Secondary | ICD-10-CM | POA: Diagnosis not present

## 2013-05-13 DIAGNOSIS — I2510 Atherosclerotic heart disease of native coronary artery without angina pectoris: Secondary | ICD-10-CM | POA: Diagnosis not present

## 2013-05-18 DIAGNOSIS — Z79899 Other long term (current) drug therapy: Secondary | ICD-10-CM | POA: Diagnosis not present

## 2013-05-18 DIAGNOSIS — E119 Type 2 diabetes mellitus without complications: Secondary | ICD-10-CM | POA: Diagnosis not present

## 2013-05-18 DIAGNOSIS — I2510 Atherosclerotic heart disease of native coronary artery without angina pectoris: Secondary | ICD-10-CM | POA: Diagnosis not present

## 2013-05-18 DIAGNOSIS — I10 Essential (primary) hypertension: Secondary | ICD-10-CM | POA: Diagnosis not present

## 2013-05-26 DIAGNOSIS — E119 Type 2 diabetes mellitus without complications: Secondary | ICD-10-CM | POA: Diagnosis not present

## 2013-05-26 DIAGNOSIS — I2510 Atherosclerotic heart disease of native coronary artery without angina pectoris: Secondary | ICD-10-CM | POA: Diagnosis not present

## 2013-05-26 DIAGNOSIS — I10 Essential (primary) hypertension: Secondary | ICD-10-CM | POA: Diagnosis not present

## 2013-05-26 DIAGNOSIS — Z1212 Encounter for screening for malignant neoplasm of rectum: Secondary | ICD-10-CM | POA: Diagnosis not present

## 2013-06-08 ENCOUNTER — Ambulatory Visit (INDEPENDENT_AMBULATORY_CARE_PROVIDER_SITE_OTHER): Payer: Medicare Other | Admitting: Cardiology

## 2013-06-08 ENCOUNTER — Encounter: Payer: Self-pay | Admitting: Cardiology

## 2013-06-08 VITALS — BP 119/71 | HR 84 | Ht 72.0 in | Wt 211.5 lb

## 2013-06-08 DIAGNOSIS — R0609 Other forms of dyspnea: Secondary | ICD-10-CM

## 2013-06-08 DIAGNOSIS — I10 Essential (primary) hypertension: Secondary | ICD-10-CM | POA: Diagnosis not present

## 2013-06-08 DIAGNOSIS — I7090 Unspecified atherosclerosis: Secondary | ICD-10-CM

## 2013-06-08 DIAGNOSIS — I679 Cerebrovascular disease, unspecified: Secondary | ICD-10-CM | POA: Diagnosis not present

## 2013-06-08 DIAGNOSIS — R0989 Other specified symptoms and signs involving the circulatory and respiratory systems: Secondary | ICD-10-CM | POA: Diagnosis not present

## 2013-06-08 DIAGNOSIS — I2510 Atherosclerotic heart disease of native coronary artery without angina pectoris: Secondary | ICD-10-CM

## 2013-06-08 NOTE — Assessment & Plan Note (Signed)
Carotid ultrasound obtained 3 years ago. A repeat study has been requested. If disease remains mild without evidence for stenosis, future routine screening studies will probably not be necessary.

## 2013-06-08 NOTE — Assessment & Plan Note (Signed)
No elevated blood pressures have been recorded for at least the past 4 months. Current therapy appears adequate.

## 2013-06-08 NOTE — Progress Notes (Signed)
Patient ID: Jimmy Knox, male   DOB: 1932/12/08, 79 y.o.   MRN: [id number]  HPI: Schedule return visit for continued assessment and treatment of coronary artery disease. Jimmy Knox has done fairly well over the past few months. 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 Occasionally, he experiences dizziness, dyspnea and severe fatigue when he attempts to do too much.  Current Outpatient Prescriptions  Medication Sig Dispense Refill  . amitriptyline (ELAVIL) 50 MG tablet Take 50 mg by mouth at bedtime.      Marland Kitchen aspirin 81 MG chewable tablet Chew 81 mg by mouth daily.      Marland Kitchen losartan (COZAAR) 50 MG tablet Take 50 mg by mouth daily.      . metoprolol succinate (TOPROL-XL) 25 MG 24 hr tablet Take 25 mg by mouth daily.      . nitroGLYCERIN (NITROSTAT) 0.4 MG SL tablet Place 1 tablet (0.4 mg total) under the tongue every 5 (five) minutes as needed for chest pain.  25 tablet  12  . simvastatin (ZOCOR) 40 MG tablet Take 20 mg by mouth every evening.        No current facility-administered medications for this visit.   No Known Allergies   Past medical history, social history, and family history reviewed and updated.  ROS: Denies orthopnea, PND, pedal edema, weight gain. He has never used sublingual nitroglycerin. He has a modest remote history of cigarette smoking was discontinued 40 years ago and no history of lung problems.   All other systems reviewed and are negative.  PHYSICAL EXAM: BP 119/71  Pulse 84  Ht 6' (1.829 m)  Wt 95.936 kg (211 lb 8 oz)  BMI 28.68 kg/m2;  Body mass index is 28.68 kg/(m^2). General-Well developed; no acute distress Body habitus-mildly overweight Neck-No JVD; no carotid bruits Lungs-clear lung fields; resonant to percussion Cardiovascular-normal PMI; normal S1 and S2; modest systolic murmur Abdomen-normal bowel sounds; soft and non-tender without masses or organomegaly Musculoskeletal-No  deformities, no cyanosis or clubbing Neurologic-Normal cranial nerves; symmetric strength and tone Skin-Warm, no significant lesions Extremities-distal pulses intact; no edema  Jimmy Bing, MD 06/08/2013  1:10 PM  ASSESSMENT AND PLAN

## 2013-06-08 NOTE — Patient Instructions (Addendum)
Your physician recommends that you schedule a follow-up appointment in: 4 MONTHS  Your physician has requested that you have an echocardiogram. Echocardiography is a painless test that uses sound waves to create images of your heart. It provides your doctor with information about the size and shape of your heart and how well your heart's chambers and valves are working. This procedure takes approximately one hour. There are no restrictions for this procedure.  Your physician has recommended that you have a pulmonary function test. Pulmonary Function Tests are a group of tests that measure how well air moves in and out of your lungs.  Your physician has requested that you have a carotid duplex. This test is an ultrasound of the carotid arteries in your neck. It looks at blood flow through these arteries that supply the brain with blood. Allow one hour for this exam. There are no restrictions or special instructions.  Your physician recommends THAT YOU WALK THREE TIMES A WEEK, GRADUALLY INCREASE DISTANCE AND EFFORT

## 2013-06-08 NOTE — Progress Notes (Deleted)
Name: Jimmy Knox    DOB: [birth year]  Age: 79 y.o.  MR#: [id number]       PCP:  Carylon Perches, MD      Insurance: Payor: MEDICARE / Plan: MEDICARE PART A AND B / Product Type: *No Product type* /   CC:   No chief complaint on file. BOTTLES-NOTED PCP CUT HIS ZOCOR TO 20MG  LAST WEEK FROM 40MG   VS Filed Vitals:   06/08/13 1103  BP: 119/71  Pulse: 84  Height: 6' (1.829 m)  Weight: 211 lb 8 oz (95.936 kg)    Weights Current Weight  06/08/13 211 lb 8 oz (95.936 kg)  03/08/13 211 lb 12.8 oz (96.072 kg)  02/14/13 212 lb (96.163 kg)    Blood Pressure  BP Readings from Last 3 Encounters:  06/08/13 119/71  03/08/13 110/70  02/14/13 118/74     Admit date:  (Not on file) Last encounter with RMR:  02/18/2013   Allergy Review of patient's allergies indicates no known allergies.  Current Outpatient Prescriptions  Medication Sig Dispense Refill  . amitriptyline (ELAVIL) 50 MG tablet Take 50 mg by mouth at bedtime.      Marland Kitchen aspirin 81 MG chewable tablet Chew 81 mg by mouth daily.      Marland Kitchen losartan (COZAAR) 50 MG tablet Take 50 mg by mouth daily.      . metoprolol succinate (TOPROL-XL) 25 MG 24 hr tablet Take 25 mg by mouth daily.      . nitroGLYCERIN (NITROSTAT) 0.4 MG SL tablet Place 1 tablet (0.4 mg total) under the tongue every 5 (five) minutes as needed for chest pain.  25 tablet  12  . simvastatin (ZOCOR) 40 MG tablet Take 20 mg by mouth every evening.        No current facility-administered medications for this visit.    Discontinued Meds:    Medications Discontinued During This Encounter  Medication Reason  . omeprazole (PRILOSEC) 20 MG capsule Error    Patient Active Problem List   Diagnosis Date Noted  . Cerebrovascular disease 02/14/2013  . Arteriosclerotic cardiovascular disease (ASCVD)   . Hypertension   . Hyperlipidemia   . Fasting hyperglycemia     LABS    Component Value Date/Time   NA 137 02/10/2013 1112   K 4.1 02/10/2013 1112   CL 103 02/10/2013 1112   CO2  25 02/10/2013 1112   GLUCOSE 115* 02/10/2013 1112   BUN 18 02/10/2013 1112   CREATININE 1.06 02/10/2013 1112   CALCIUM 9.5 02/10/2013 1112   GFRNONAA 65* 02/10/2013 1112   GFRAA 75* 02/10/2013 1112   CMP     Component Value Date/Time   NA 137 02/10/2013 1112   K 4.1 02/10/2013 1112   CL 103 02/10/2013 1112   CO2 25 02/10/2013 1112   GLUCOSE 115* 02/10/2013 1112   BUN 18 02/10/2013 1112   CREATININE 1.06 02/10/2013 1112   CALCIUM 9.5 02/10/2013 1112   PROT 7.3 02/10/2013 1112   ALBUMIN 3.8 02/10/2013 1112   AST 26 02/10/2013 1112   ALT 21 02/10/2013 1112   ALKPHOS 83 02/10/2013 1112   BILITOT 0.5 02/10/2013 1112   GFRNONAA 65* 02/10/2013 1112   GFRAA 75* 02/10/2013 1112       Component Value Date/Time   WBC 8.2 02/10/2013 1112   HGB 14.1 02/10/2013 1112   HCT 41.5 02/10/2013 1112   MCV 91.4 02/10/2013 1112    Lipid Panel  No results found for this basename: chol, trig, hdl, cholhdl, vldl,  ldlcalc    ABG No results found for this basename: phart, pco2, pco2art, po2, po2art, hco3, tco2, acidbasedef, o2sat     No results found for this basename: TSH   BNP (last 3 results) No results found for this basename: PROBNP,  in the last 8760 hours Cardiac Panel (last 3 results) No results found for this basename: CKTOTAL, CKMB, TROPONINI, RELINDX,  in the last 72 hours  Iron/TIBC/Ferritin No results found for this basename: iron, tibc, ferritin     EKG Orders placed in visit on 02/14/13  . EKG 12-LEAD     Prior Assessment and Plan Problem List as of 06/08/2013   Arteriosclerotic cardiovascular disease (ASCVD)   Last Assessment & Plan   03/08/2013 Office Visit Written 03/08/2013  1:19 PM by Jodelle Gross, NP     Discussed test results with the patient and his wife. They verbalize understanding. I have warned them that if he has recurrent chest pain we will plan for cardiac cath. He will start a walking program to increase his stamina. If breathing status worsens we recommend see  pulmonologist or have PFTs completed.    Hypertension   Last Assessment & Plan   03/08/2013 Office Visit Written 03/08/2013  1:20 PM by Jodelle Gross, NP     Good control of BP no changes. Rx authoroized for refill.    Hyperlipidemia   Last Assessment & Plan   02/14/2013 Office Visit Written 02/14/2013  5:36 PM by Kathlen Brunswick, MD     No lipid profile available; one will be obtained.    Fasting hyperglycemia   Last Assessment & Plan   02/14/2013 Office Visit Written 02/14/2013  5:35 PM by Kathlen Brunswick, MD     Hyperglycemia has been modest in the past. Hemoglobin A1c level will be obtained with his next otherwise needed laboratory testing.    Cerebrovascular disease   Last Assessment & Plan   02/14/2013 Office Visit Written 02/14/2013  5:35 PM by Kathlen Brunswick, MD     Patient has not had obstructive disease of the internal carotid arteries on multiple carotid ultrasound studies in the past, but the most recent of these was in 03/2010. A repeat study will be obtained in the near future.        Imaging: No results found.

## 2013-06-08 NOTE — Assessment & Plan Note (Addendum)
Recent stress test revealed extremely limited exercise tolerance, myocardial scarring in 2 different vascular territories, but preserved LV systolic function and minimal to no ischemia. I explained to patient and wife that this suggest a good prognosis and that cardiac catheterization would not necessarily prove beneficial for him. He appears to be relatively satisfied with his current level of function. I did suggest daily walks with a gradual increase in the level of exercise. CBC was normal 3 months ago. A chest x-ray, BNP level, and echocardiogram will also be obtained to exclude any evidence for congestive heart failure.

## 2013-06-09 ENCOUNTER — Encounter (HOSPITAL_COMMUNITY): Payer: Self-pay | Admitting: Cardiology

## 2013-06-10 ENCOUNTER — Telehealth: Payer: Self-pay | Admitting: *Deleted

## 2013-06-10 DIAGNOSIS — I679 Cerebrovascular disease, unspecified: Secondary | ICD-10-CM

## 2013-06-10 DIAGNOSIS — R0602 Shortness of breath: Secondary | ICD-10-CM

## 2013-06-10 DIAGNOSIS — E785 Hyperlipidemia, unspecified: Secondary | ICD-10-CM

## 2013-06-10 DIAGNOSIS — I10 Essential (primary) hypertension: Secondary | ICD-10-CM

## 2013-06-10 NOTE — Telephone Encounter (Signed)
Left message for patient to return my call.

## 2013-06-13 ENCOUNTER — Ambulatory Visit (HOSPITAL_COMMUNITY)
Admission: RE | Admit: 2013-06-13 | Discharge: 2013-06-13 | Disposition: A | Discharge status: Home / Self Care | Payer: Medicare Other | Source: Ambulatory Visit | Attending: Cardiology | Admitting: Cardiology

## 2013-06-13 DIAGNOSIS — R0609 Other forms of dyspnea: Secondary | ICD-10-CM | POA: Diagnosis not present

## 2013-06-13 DIAGNOSIS — I658 Occlusion and stenosis of other precerebral arteries: Secondary | ICD-10-CM | POA: Diagnosis not present

## 2013-06-13 DIAGNOSIS — I2510 Atherosclerotic heart disease of native coronary artery without angina pectoris: Secondary | ICD-10-CM | POA: Insufficient documentation

## 2013-06-13 DIAGNOSIS — I517 Cardiomegaly: Secondary | ICD-10-CM | POA: Diagnosis not present

## 2013-06-13 DIAGNOSIS — I10 Essential (primary) hypertension: Secondary | ICD-10-CM | POA: Insufficient documentation

## 2013-06-13 DIAGNOSIS — I6529 Occlusion and stenosis of unspecified carotid artery: Secondary | ICD-10-CM | POA: Insufficient documentation

## 2013-06-13 DIAGNOSIS — R0989 Other specified symptoms and signs involving the circulatory and respiratory systems: Secondary | ICD-10-CM | POA: Insufficient documentation

## 2013-06-13 NOTE — Progress Notes (Signed)
*  PRELIMINARY RESULTS* Echocardiogram 2D Echocardiogram has been performed.  Conrad  06/13/2013, 1:15 PM

## 2013-06-14 ENCOUNTER — Encounter: Payer: Self-pay | Admitting: *Deleted

## 2013-06-14 ENCOUNTER — Encounter: Payer: Self-pay | Admitting: Cardiology

## 2013-06-14 NOTE — Telephone Encounter (Signed)
Open in error

## 2013-06-15 ENCOUNTER — Telehealth: Payer: Self-pay | Admitting: *Deleted

## 2013-06-15 NOTE — Telephone Encounter (Signed)
Message copied by Ovidio Kin on Wed Jun 15, 2013  5:19 PM ------      Message from: Kathlen Brunswick      Created: Tue Jun 14, 2013 11:00 AM      Regarding: Cancel test       Please cancel stress nuclear on this gentleman if it has not already been done. Test performed 4 months ago. ------

## 2013-06-15 NOTE — Telephone Encounter (Signed)
Noted pt order placed on 02-14-13 for myoview, pt had performed on 02-15-13, no instructions/apt advised for pt to have repeat test noted, cancelled order in chart for myoview from the 02-14-13 test to ensure test not rescheduled as advised

## 2013-06-23 ENCOUNTER — Ambulatory Visit (HOSPITAL_COMMUNITY)
Admission: RE | Admit: 2013-06-23 | Discharge: 2013-06-23 | Disposition: A | Discharge status: Home / Self Care | Payer: Medicare Other | Source: Ambulatory Visit | Attending: Cardiology | Admitting: Cardiology

## 2013-06-23 DIAGNOSIS — R0602 Shortness of breath: Secondary | ICD-10-CM | POA: Insufficient documentation

## 2013-06-23 DIAGNOSIS — I2510 Atherosclerotic heart disease of native coronary artery without angina pectoris: Secondary | ICD-10-CM

## 2013-06-23 LAB — BLOOD GAS, ARTERIAL
O2 Saturation: 97 %
Patient temperature: 37
pO2, Arterial: 97.6 mmHg (ref 80.0–100.0)

## 2013-06-24 NOTE — Procedures (Signed)
NAME:  Jimmy Knox, SAWYERS NO.:  [id number]  MEDICAL RECORD NO.:  [id number]  LOCATION:  RESP                          FACILITY:  [facility]  PHYSICIAN:  Adrain Nesbit L. Juanetta Gosling, M.D.DATE OF BIRTH:  27-Oct-1932  DATE OF PROCEDURE: DATE OF DISCHARGE:  06/23/2013                           PULMONARY FUNCTION TEST   REASON FOR PULMONARY FUNCTION TESTING:  Shortness of breath.  RESULTS: 1. Spirometry shows a mild ventilatory defect without evidence of     airflow obstruction. 2. Lung volumes are normal. 3. DLCO is severely reduced but corrects somewhat when volume is taken     into account. 4. Airway resistance is slightly high. 5. Arterial blood gas is normal. 6. DLCO is abnormal, but please note the technician's comment.     Otherwise, this pulmonary function test does not show a cause of     shortness of breath.  Clinical correlation is suggested.     Renessa Wellnitz L. Juanetta Gosling, M.D.     [facility]/MEDQ  D:  06/23/2013  T:  06/24/2013  Job:  [id number]

## 2013-07-04 LAB — PULMONARY FUNCTION TEST

## 2013-07-05 ENCOUNTER — Encounter: Payer: Self-pay | Admitting: Cardiology

## 2013-08-15 DIAGNOSIS — H47239 Glaucomatous optic atrophy, unspecified eye: Secondary | ICD-10-CM | POA: Diagnosis not present

## 2013-08-15 DIAGNOSIS — Z961 Presence of intraocular lens: Secondary | ICD-10-CM | POA: Diagnosis not present

## 2013-09-19 DIAGNOSIS — E119 Type 2 diabetes mellitus without complications: Secondary | ICD-10-CM | POA: Diagnosis not present

## 2013-09-20 ENCOUNTER — Encounter: Payer: Self-pay | Admitting: Cardiovascular Disease

## 2013-09-27 DIAGNOSIS — E119 Type 2 diabetes mellitus without complications: Secondary | ICD-10-CM | POA: Diagnosis not present

## 2013-09-27 DIAGNOSIS — I10 Essential (primary) hypertension: Secondary | ICD-10-CM | POA: Diagnosis not present

## 2013-09-27 DIAGNOSIS — Z23 Encounter for immunization: Secondary | ICD-10-CM | POA: Diagnosis not present

## 2013-10-11 ENCOUNTER — Ambulatory Visit: Payer: Medicare Other | Admitting: Cardiovascular Disease

## 2013-10-12 ENCOUNTER — Encounter: Payer: Self-pay | Admitting: Cardiovascular Disease

## 2013-10-12 ENCOUNTER — Ambulatory Visit (INDEPENDENT_AMBULATORY_CARE_PROVIDER_SITE_OTHER): Payer: Medicare Other | Admitting: Cardiovascular Disease

## 2013-10-12 VITALS — BP 124/79 | HR 85 | Ht 73.0 in | Wt 218.0 lb

## 2013-10-12 DIAGNOSIS — I2510 Atherosclerotic heart disease of native coronary artery without angina pectoris: Secondary | ICD-10-CM

## 2013-10-12 DIAGNOSIS — E785 Hyperlipidemia, unspecified: Secondary | ICD-10-CM

## 2013-10-12 DIAGNOSIS — I255 Ischemic cardiomyopathy: Secondary | ICD-10-CM | POA: Insufficient documentation

## 2013-10-12 DIAGNOSIS — I6529 Occlusion and stenosis of unspecified carotid artery: Secondary | ICD-10-CM | POA: Diagnosis not present

## 2013-10-12 DIAGNOSIS — I658 Occlusion and stenosis of other precerebral arteries: Secondary | ICD-10-CM

## 2013-10-12 DIAGNOSIS — I6523 Occlusion and stenosis of bilateral carotid arteries: Secondary | ICD-10-CM

## 2013-10-12 DIAGNOSIS — I2589 Other forms of chronic ischemic heart disease: Secondary | ICD-10-CM

## 2013-10-12 DIAGNOSIS — I10 Essential (primary) hypertension: Secondary | ICD-10-CM

## 2013-10-12 NOTE — Progress Notes (Signed)
Patient ID: Jimmy Knox, male   DOB: Jan 01, 1932, 79 y.o.   MRN: [id number]      SUBJECTIVE: Jimmy Knox has a h/o CAD with a prior MI and stent around 2000, carotid artery disease (less than 50% stenosis bilaterally), ischemic cardiomyopathy, HTN, and hyperlipidemia. His most recent echocardiogram revealed an EF of 35%. His most recent stress test revealed myocardial scar without ischemia.  He remains active doing farming and yardwork. The patient denies any symptoms of chest pain, palpitations, shortness of breath, lightheadedness, dizziness, leg swelling, orthopnea, PND, and syncope.  SocHx: Married for 54 years. Has 2 children and 3 granddaughters.    No Known Allergies  Current Outpatient Prescriptions  Medication Sig Dispense Refill  . amitriptyline (ELAVIL) 50 MG tablet Take 50 mg by mouth at bedtime.      Marland Kitchen aspirin 81 MG chewable tablet Chew 81 mg by mouth daily.      Marland Kitchen losartan (COZAAR) 50 MG tablet Take 50 mg by mouth daily.      . metoprolol succinate (TOPROL-XL) 25 MG 24 hr tablet Take 25 mg by mouth daily.      . nitroGLYCERIN (NITROSTAT) 0.4 MG SL tablet Place 1 tablet (0.4 mg total) under the tongue every 5 (five) minutes as needed for chest pain.  25 tablet  12  . simvastatin (ZOCOR) 40 MG tablet Take 20 mg by mouth every evening.        No current facility-administered medications for this visit.    Past Medical History  Diagnosis Date  . Arteriosclerotic cardiovascular disease (ASCVD)     Stent in approximately 2000  . Hypertension   . Hyperlipidemia   . Fasting hyperglycemia     diet-controlled  . Diabetes mellitus without complication     Past Surgical History  Procedure Laterality Date  . Carotid stent    . Back surgery      History   Social History  . Marital Status: Married    Spouse Name: N/A    Number of Children: N/A  . Years of Education: N/A   Occupational History  . Retired    Social History Main Topics  . Smoking status:  Former Smoker -- 0.50 packs/day for 20 years    Types: Cigarettes    Quit date: 12/29/1968  . Smokeless tobacco: Current User    Types: Chew  . Alcohol Use: No  . Drug Use: Not on file  . Sexual Activity: Not on file   Other Topics Concern  . Not on file   Social History Narrative  . No narrative on file     Filed Vitals:   10/12/13 1118  BP: 124/79  Pulse: 85  Height: 6\' 1"  (1.854 m)  Weight: 218 lb (98.884 kg)    PHYSICAL EXAM General: NAD Neck: No JVD, no thyromegaly or thyroid nodule.  Lungs: Clear to auscultation bilaterally with normal respiratory effort. CV: Nondisplaced PMI.  Heart regular S1/S2, no S3/S4, no murmur.  No peripheral edema.  No carotid bruit.  Normal pedal pulses.  Abdomen: Soft, nontender, no hepatosplenomegaly, no distention.  Neurologic: Alert and oriented x 3.  Psych: Normal affect. Extremities: No clubbing or cyanosis.   ECG: reviewed and available in electronic records.  ECHO: - Left ventricle: The cavity size was normal. There was mild concentric hypertrophy. Systolic function was moderately reduced. The estimated ejection fraction was 35%. Contractility of the apex and distal inferior segment was preserved. Akinesis of the basalinferolateral and inferior myocardium. Moderate hypokinesis of the  basal-midanteroseptal and anterior myocardium. - Aortic valve: Mildly calcified annulus. Trileaflet.  Nuclear MPI IMPRESSION: Abnormal combined exercise and pharmacologic stress nuclear myocardial study revealing impaired exercise capacity, abnormalities of the stress EKG as described, which are not diagnostic for myocardial ischemia, mild left ventricular dilatation and low normal overall left ventricular systolic function with segmental wall motion abnormalities as described. By scintigraphic imaging, there is a sizable area of inferior infarction extending inferoseptally, inferolaterally and inferoapically as well as a small anterior  wall scar. No significant myocardial ischemia identified.     ASSESSMENT AND PLAN: 1. CAD with ischemic cardiomyopathy, EF 35%: symptomatically stable. Will continue current therapy. Encouraged continued exercise and medication compliance. No symptoms of heart failure. 2. HTN: controlled on current dose of losartan. 3. Hyperlipidemia: on simvastatin.   Prentice Docker, M.D., F.A.C.C.

## 2013-10-12 NOTE — Patient Instructions (Addendum)
Your physician recommends that you schedule a follow-up appointment in: 6 MONTHS 

## 2013-10-31 DIAGNOSIS — D0460 Carcinoma in situ of skin of unspecified upper limb, including shoulder: Secondary | ICD-10-CM | POA: Diagnosis not present

## 2013-10-31 DIAGNOSIS — D485 Neoplasm of uncertain behavior of skin: Secondary | ICD-10-CM | POA: Diagnosis not present

## 2013-10-31 DIAGNOSIS — Z85828 Personal history of other malignant neoplasm of skin: Secondary | ICD-10-CM | POA: Diagnosis not present

## 2013-10-31 DIAGNOSIS — L570 Actinic keratosis: Secondary | ICD-10-CM | POA: Diagnosis not present

## 2013-10-31 DIAGNOSIS — Z0189 Encounter for other specified special examinations: Secondary | ICD-10-CM | POA: Diagnosis not present

## 2013-12-07 DIAGNOSIS — D045 Carcinoma in situ of skin of trunk: Secondary | ICD-10-CM | POA: Diagnosis not present

## 2014-01-30 DIAGNOSIS — E119 Type 2 diabetes mellitus without complications: Secondary | ICD-10-CM | POA: Diagnosis not present

## 2014-01-30 DIAGNOSIS — S0180XA Unspecified open wound of other part of head, initial encounter: Secondary | ICD-10-CM | POA: Diagnosis not present

## 2014-02-02 DIAGNOSIS — I10 Essential (primary) hypertension: Secondary | ICD-10-CM | POA: Diagnosis not present

## 2014-02-02 DIAGNOSIS — E119 Type 2 diabetes mellitus without complications: Secondary | ICD-10-CM | POA: Diagnosis not present

## 2014-04-12 ENCOUNTER — Encounter: Payer: Self-pay | Admitting: Cardiovascular Disease

## 2014-04-12 ENCOUNTER — Ambulatory Visit (INDEPENDENT_AMBULATORY_CARE_PROVIDER_SITE_OTHER): Payer: Medicare Other | Admitting: Cardiovascular Disease

## 2014-04-12 VITALS — BP 107/67 | HR 70 | Ht 73.0 in | Wt 214.0 lb

## 2014-04-12 DIAGNOSIS — R0989 Other specified symptoms and signs involving the circulatory and respiratory systems: Secondary | ICD-10-CM

## 2014-04-12 DIAGNOSIS — I2510 Atherosclerotic heart disease of native coronary artery without angina pectoris: Secondary | ICD-10-CM

## 2014-04-12 DIAGNOSIS — I2589 Other forms of chronic ischemic heart disease: Secondary | ICD-10-CM | POA: Diagnosis not present

## 2014-04-12 DIAGNOSIS — R0609 Other forms of dyspnea: Secondary | ICD-10-CM

## 2014-04-12 DIAGNOSIS — I255 Ischemic cardiomyopathy: Secondary | ICD-10-CM

## 2014-04-12 DIAGNOSIS — E785 Hyperlipidemia, unspecified: Secondary | ICD-10-CM

## 2014-04-12 DIAGNOSIS — I10 Essential (primary) hypertension: Secondary | ICD-10-CM | POA: Diagnosis not present

## 2014-04-12 NOTE — Patient Instructions (Addendum)
Your physician wants you to follow-up in: 6 months You will receive a reminder letter in the mail two months in advance. If you don't receive a letter, please call our office to schedule the follow-up appointment.     Your physician recommends that you continue on your current medications as directed. Please refer to the Current Medication list given to you today.      Thank you for choosing [facility] !        

## 2014-04-12 NOTE — Progress Notes (Signed)
Patient ID: Jimmy Knox, male   DOB: December 07, 1932, 80 y.o.   MRN: [id number]      SUBJECTIVE: Mr. Jimmy Knox has a h/o CAD with a prior MI and stent around 2000, carotid artery disease (less than 50% stenosis bilaterally), ischemic cardiomyopathy, HTN, and hyperlipidemia. His most recent echocardiogram in 05/2013 revealed an EF of 35%. His most recent stress test in 01/2013 revealed myocardial scar without ischemia.   He remains active doing farming and yardwork, so long as he does it at a slower pace. The patient denies any symptoms of chest pain, palpitations, dizziness, leg swelling, PND, and syncope. He gets dyspneic if he overexerts himself. He sleeps in a recliner and has done so for the past 6 years.  He enjoys attending his granddaughters' events.   SocHx: Married for 54 years, 60 in June. Has 2 children and 3 granddaughters who live fairly close to them.        No Known Allergies  Current Outpatient Prescriptions  Medication Sig Dispense Refill  . amitriptyline (ELAVIL) 50 MG tablet Take 50 mg by mouth at bedtime.      Marland Kitchen aspirin 81 MG chewable tablet Chew 81 mg by mouth daily.      Marland Kitchen losartan (COZAAR) 50 MG tablet Take 50 mg by mouth daily.      . metoprolol succinate (TOPROL-XL) 25 MG 24 hr tablet Take 25 mg by mouth daily.      . nitroGLYCERIN (NITROSTAT) 0.4 MG SL tablet Place 1 tablet (0.4 mg total) under the tongue every 5 (five) minutes as needed for chest pain.  25 tablet  12  . simvastatin (ZOCOR) 40 MG tablet Take 20 mg by mouth every evening.        No current facility-administered medications for this visit.    Past Medical History  Diagnosis Date  . Arteriosclerotic cardiovascular disease (ASCVD)     Stent in approximately 2000  . Hypertension   . Hyperlipidemia   . Fasting hyperglycemia     diet-controlled  . Diabetes mellitus without complication     Past Surgical History  Procedure Laterality Date  . Carotid stent    . Back surgery       History   Social History  . Marital Status: Married    Spouse Name: N/A    Number of Children: N/A  . Years of Education: N/A   Occupational History  . Retired    Social History Main Topics  . Smoking status: Former Smoker -- 0.50 packs/day for 20 years    Types: Cigarettes    Quit date: 12/29/1968  . Smokeless tobacco: Current User    Types: Chew  . Alcohol Use: No  . Drug Use: Not on file  . Sexual Activity: Not on file   Other Topics Concern  . Not on file   Social History Narrative  . No narrative on file     Filed Vitals:   04/12/14 0856  BP: 107/67  Pulse: 70  Height: 6\' 1"  (1.854 m)  Weight: 214 lb (97.07 kg)    PHYSICAL EXAM General: NAD Neck: No JVD, no thyromegaly. Lungs: Clear to auscultation bilaterally with normal respiratory effort. CV: Nondisplaced PMI.  Regular rate and rhythm, normal S1/S2, no S3/S4, no murmur. No pretibial or periankle edema.  No carotid bruit.  Normal pedal pulses.  Abdomen: Soft, nontender, no hepatosplenomegaly, no distention.  Neurologic: Alert and oriented x 3.  Psych: Normal affect. Extremities: No clubbing or cyanosis.   ECG: reviewed  and available in electronic records.      ASSESSMENT AND PLAN: 1. CAD with ischemic cardiomyopathy, EF 35%: Symptomatically stable. Will continue current therapy. No symptoms of heart failure. We talked about the possibility of adding low-dose nitrates but given his soft blood pressure, I am not inclined to do so. 2. HTN: Controlled on current dose of losartan.  3. Hyperlipidemia: On simvastatin and lipids have been checked by PCP (Dr. Willey Blade).  Dispo: f/u 6 months.  Kate Sable, M.D., F.A.C.C.

## 2014-05-18 DIAGNOSIS — H47239 Glaucomatous optic atrophy, unspecified eye: Secondary | ICD-10-CM | POA: Diagnosis not present

## 2014-05-18 DIAGNOSIS — Z961 Presence of intraocular lens: Secondary | ICD-10-CM | POA: Diagnosis not present

## 2014-05-19 DIAGNOSIS — I2510 Atherosclerotic heart disease of native coronary artery without angina pectoris: Secondary | ICD-10-CM | POA: Diagnosis not present

## 2014-05-19 DIAGNOSIS — E119 Type 2 diabetes mellitus without complications: Secondary | ICD-10-CM | POA: Diagnosis not present

## 2014-05-19 DIAGNOSIS — I10 Essential (primary) hypertension: Secondary | ICD-10-CM | POA: Diagnosis not present

## 2014-05-19 DIAGNOSIS — Z79899 Other long term (current) drug therapy: Secondary | ICD-10-CM | POA: Diagnosis not present

## 2014-05-30 DIAGNOSIS — Z23 Encounter for immunization: Secondary | ICD-10-CM | POA: Diagnosis not present

## 2014-05-30 DIAGNOSIS — Z0000 Encounter for general adult medical examination without abnormal findings: Secondary | ICD-10-CM | POA: Diagnosis not present

## 2014-09-06 DIAGNOSIS — L905 Scar conditions and fibrosis of skin: Secondary | ICD-10-CM | POA: Diagnosis not present

## 2014-09-06 DIAGNOSIS — D1779 Benign lipomatous neoplasm of other sites: Secondary | ICD-10-CM | POA: Diagnosis not present

## 2014-09-06 DIAGNOSIS — D045 Carcinoma in situ of skin of trunk: Secondary | ICD-10-CM | POA: Diagnosis not present

## 2014-09-06 DIAGNOSIS — L723 Sebaceous cyst: Secondary | ICD-10-CM | POA: Diagnosis not present

## 2014-09-27 DIAGNOSIS — Z23 Encounter for immunization: Secondary | ICD-10-CM | POA: Diagnosis not present

## 2014-10-04 DIAGNOSIS — E119 Type 2 diabetes mellitus without complications: Secondary | ICD-10-CM | POA: Diagnosis not present

## 2014-10-11 ENCOUNTER — Encounter: Payer: Self-pay | Admitting: Cardiovascular Disease

## 2014-10-11 ENCOUNTER — Ambulatory Visit (INDEPENDENT_AMBULATORY_CARE_PROVIDER_SITE_OTHER): Payer: Medicare Other | Admitting: Cardiovascular Disease

## 2014-10-11 VITALS — BP 112/64 | HR 62 | Ht 72.0 in | Wt 220.0 lb

## 2014-10-11 DIAGNOSIS — I10 Essential (primary) hypertension: Secondary | ICD-10-CM | POA: Diagnosis not present

## 2014-10-11 DIAGNOSIS — E785 Hyperlipidemia, unspecified: Secondary | ICD-10-CM | POA: Diagnosis not present

## 2014-10-11 DIAGNOSIS — I6523 Occlusion and stenosis of bilateral carotid arteries: Secondary | ICD-10-CM | POA: Diagnosis not present

## 2014-10-11 DIAGNOSIS — I255 Ischemic cardiomyopathy: Secondary | ICD-10-CM

## 2014-10-11 DIAGNOSIS — I2510 Atherosclerotic heart disease of native coronary artery without angina pectoris: Secondary | ICD-10-CM | POA: Diagnosis not present

## 2014-10-11 NOTE — Progress Notes (Signed)
Patient ID: Jimmy Knox, male   DOB: May 04, 1932, 80 y.o.   MRN: [id number]      SUBJECTIVE: Jimmy Knox has a h/o CAD with a prior MI and stent around 2000, carotid artery disease (less than 50% stenosis bilaterally), ischemic cardiomyopathy, HTN, and hyperlipidemia. His most recent echocardiogram in 05/2013 revealed an EF of 35%. His most recent stress test in 01/2013 revealed myocardial scar without ischemia.   He enjoyed a trip to the beach this summer with college friends (teachers, principals). He denies any symptoms of chest pain, palpitations, dizziness, leg swelling, PND, and syncope. He gets dyspneic if he overexerts himself, including activities such as pulling the cord vigorously on a weed eater.  He sleeps with his head and legs propped up due to restless leg syndrome.   SocHx: Married for 55 years in June. Has 2 children and 3 granddaughters who live fairly close to them.     Review of Systems: As per "subjective", otherwise negative.  No Known Allergies  Current Outpatient Prescriptions  Medication Sig Dispense Refill  . amitriptyline (ELAVIL) 50 MG tablet Take 50 mg by mouth at bedtime.      Marland Kitchen aspirin 81 MG chewable tablet Chew 81 mg by mouth daily.      Marland Kitchen losartan (COZAAR) 50 MG tablet Take 50 mg by mouth daily.      . metoprolol succinate (TOPROL-XL) 25 MG 24 hr tablet Take 25 mg by mouth daily.      . nitroGLYCERIN (NITROSTAT) 0.4 MG SL tablet Place 1 tablet (0.4 mg total) under the tongue every 5 (five) minutes as needed for chest pain.  25 tablet  12  . simvastatin (ZOCOR) 40 MG tablet Take 20 mg by mouth every evening.        No current facility-administered medications for this visit.    Past Medical History  Diagnosis Date  . Arteriosclerotic cardiovascular disease (ASCVD)     Stent in approximately 2000  . Hypertension   . Hyperlipidemia   . Fasting hyperglycemia     diet-controlled  . Diabetes mellitus without complication     Past Surgical  History  Procedure Laterality Date  . Carotid stent    . Back surgery      History   Social History  . Marital Status: Married    Spouse Name: N/A    Number of Children: N/A  . Years of Education: N/A   Occupational History  . Retired    Social History Main Topics  . Smoking status: Former Smoker -- 0.50 packs/day for 20 years    Types: Cigarettes    Quit date: 12/29/1968  . Smokeless tobacco: Never Used  . Alcohol Use: No  . Drug Use: Not on file  . Sexual Activity: Not on file   Other Topics Concern  . Not on file   Social History Narrative  . No narrative on file     Filed Vitals:   10/11/14 0843  BP: 112/64  Pulse: 62  Height: 6' (1.829 m)  Weight: 220 lb (99.791 kg)    PHYSICAL EXAM General: NAD HEENT: Normal. Neck: No JVD, no thyromegaly. Lungs: Clear to auscultation bilaterally with normal respiratory effort. CV: Nondisplaced PMI.  Regular rate and rhythm, normal S1/S2, no S3/S4, no murmur. No pretibial or periankle edema.  No carotid bruit.  Normal pedal pulses.  Abdomen: Soft, nontender, no hepatosplenomegaly, no distention.  Neurologic: Alert and oriented x 3.  Psych: Normal affect. Skin: Normal. Musculoskeletal: Normal range of  motion, no gross deformities. Extremities: No clubbing or cyanosis.   ECG: Most recent ECG reviewed.      ASSESSMENT AND PLAN: 1. CAD with ischemic cardiomyopathy, EF 35%: Stable ischemic heart disease. Will continue current therapy. No symptoms of heart failure. Would consider referral for ICD at some point but would need repeat echocardiogram prior to this. No palpitations. 2. Essential HTN: Controlled on current dose of losartan.  3. Hyperlipidemia: On simvastatin. Lipids monitored by PCP (Dr. Willey Blade).   Dispo: f/u 6 months.   Kate Sable, M.D., F.A.C.C.

## 2014-10-11 NOTE — Patient Instructions (Signed)
Your physician wants you to follow-up in: 6 months with Dr. Koneswaran. You will receive a reminder letter in the mail two months in advance. If you don't receive a letter, please call our office to schedule the follow-up appointment.  Your physician recommends that you continue on your current medications as directed. Please refer to the Current Medication list given to you today.  Thank you for choosing [facility]!   

## 2014-10-12 DIAGNOSIS — I25110 Atherosclerotic heart disease of native coronary artery with unstable angina pectoris: Secondary | ICD-10-CM | POA: Diagnosis not present

## 2014-10-12 DIAGNOSIS — E119 Type 2 diabetes mellitus without complications: Secondary | ICD-10-CM | POA: Diagnosis not present

## 2015-02-19 DIAGNOSIS — Z961 Presence of intraocular lens: Secondary | ICD-10-CM | POA: Diagnosis not present

## 2015-02-19 DIAGNOSIS — H47231 Glaucomatous optic atrophy, right eye: Secondary | ICD-10-CM | POA: Diagnosis not present

## 2015-02-27 DIAGNOSIS — E119 Type 2 diabetes mellitus without complications: Secondary | ICD-10-CM | POA: Diagnosis not present

## 2015-03-08 DIAGNOSIS — I5022 Chronic systolic (congestive) heart failure: Secondary | ICD-10-CM | POA: Diagnosis not present

## 2015-03-08 DIAGNOSIS — E119 Type 2 diabetes mellitus without complications: Secondary | ICD-10-CM | POA: Diagnosis not present

## 2015-04-11 ENCOUNTER — Encounter: Payer: Self-pay | Admitting: Cardiovascular Disease

## 2015-04-11 ENCOUNTER — Ambulatory Visit (INDEPENDENT_AMBULATORY_CARE_PROVIDER_SITE_OTHER): Payer: Medicare Other | Admitting: Cardiovascular Disease

## 2015-04-11 VITALS — BP 114/74 | HR 70 | Ht 73.0 in | Wt 223.0 lb

## 2015-04-11 DIAGNOSIS — I10 Essential (primary) hypertension: Secondary | ICD-10-CM | POA: Diagnosis not present

## 2015-04-11 DIAGNOSIS — I25810 Atherosclerosis of coronary artery bypass graft(s) without angina pectoris: Secondary | ICD-10-CM

## 2015-04-11 DIAGNOSIS — E785 Hyperlipidemia, unspecified: Secondary | ICD-10-CM

## 2015-04-11 DIAGNOSIS — I255 Ischemic cardiomyopathy: Secondary | ICD-10-CM

## 2015-04-11 NOTE — Progress Notes (Signed)
Patient ID: Jimmy Knox, male   DOB: [birth year], 81 y.o.   MRN: [id number]      SUBJECTIVE: Jimmy Knox has a h/o CAD with a prior MI and stent around 2000, carotid artery disease (less than 50% stenosis bilaterally), ischemic cardiomyopathy, HTN, and hyperlipidemia. His most recent echocardiogram in 05/2013 revealed an EF of 35%. His most recent stress test in 01/2013 revealed myocardial scar without ischemia.  He denies any symptoms of chest pain, palpitations, dizziness, leg swelling, PND, and syncope. He sleeps with his head and legs propped up due to restless leg syndrome.  They are looking forward to one of their granddaughter's high school graduation this year.  ECG performed in the office today demonstrates normal sinus rhythm with old inferior and anterolateral infarct.   SocHx: Will be married for 63 years in June. Has 2 children and 3 granddaughters who live fairly close to them.    Review of Systems: As per "subjective", otherwise negative.  No Known Allergies  Current Outpatient Prescriptions  Medication Sig Dispense Refill  . amitriptyline (ELAVIL) 50 MG tablet Take 50 mg by mouth at bedtime.    Marland Kitchen aspirin 81 MG chewable tablet Chew 81 mg by mouth daily.    Marland Kitchen losartan (COZAAR) 50 MG tablet Take 50 mg by mouth daily.    . metoprolol succinate (TOPROL-XL) 25 MG 24 hr tablet Take 25 mg by mouth daily.    . simvastatin (ZOCOR) 40 MG tablet Take 20 mg by mouth every evening.      No current facility-administered medications for this visit.    Past Medical History  Diagnosis Date  . Arteriosclerotic cardiovascular disease (ASCVD)     Stent in approximately 2000  . Hypertension   . Hyperlipidemia   . Fasting hyperglycemia     diet-controlled  . Diabetes mellitus without complication     Past Surgical History  Procedure Laterality Date  . Carotid stent    . Back surgery      History   Social History  . Marital Status: Married    Spouse Name: N/A  .  Number of Children: N/A  . Years of Education: N/A   Occupational History  . Retired    Social History Main Topics  . Smoking status: Former Smoker -- 0.50 packs/day for 20 years    Types: Cigarettes    Start date: 12/30/1943    Quit date: 12/29/1968  . Smokeless tobacco: Never Used  . Alcohol Use: No  . Drug Use: Not on file  . Sexual Activity: Not on file   Other Topics Concern  . Not on file   Social History Narrative     Filed Vitals:   04/11/15 0851  BP: 114/74  Pulse: 70  Height: 6\' 1"  (1.854 m)  Weight: 223 lb (101.152 kg)  SpO2: 96%    PHYSICAL EXAM General: NAD HEENT: Normal. Neck: No JVD, no thyromegaly. Lungs: Clear to auscultation bilaterally with normal respiratory effort. CV: Nondisplaced PMI.  Regular rate and rhythm, normal S1/S2, no S3/S4, no murmur. No pretibial or periankle edema.  No carotid bruit.  Normal pedal pulses.  Abdomen: Soft, nontender, no hepatosplenomegaly, no distention.  Neurologic: Alert and oriented x 3.  Psych: Normal affect. Skin: Normal. Musculoskeletal: Normal range of motion, no gross deformities. Extremities: No clubbing or cyanosis.   ECG: Most recent ECG reviewed.      ASSESSMENT AND PLAN: 1. CAD with ischemic cardiomyopathy, EF 35%: Stable ischemic heart disease. Will continue current therapy. No  symptoms of heart failure. We had a lengthy discussion about secondary prevention of sudden cardiac death with an ICD. He and his wife are agreeable. I will reassess his EF with an echocardiogram. Denies palpitations and dizziness.  2. Essential HTN: Controlled on current dose of losartan. No changes to therapy required.  3. Hyperlipidemia: On simvastatin. Lipids monitored by PCP (Dr. Willey Blade).   Dispo: f/u 6 months.   Kate Sable, M.D., F.A.C.C.

## 2015-04-11 NOTE — Patient Instructions (Signed)
Your physician wants you to follow-up in: 6 months with Dr.Koneswaran You will receive a reminder letter in the mail two months in advance. If you don't receive a letter, please call our office to schedule the follow-up appointment.  Your physician recommends that you continue on your current medications as directed. Please refer to the Current Medication list given to you today.     Your physician has requested that you have an echocardiogram. Echocardiography is a painless test that uses sound waves to create images of your heart. It provides your doctor with information about the size and shape of your heart and how well your heart's chambers and valves are working. This procedure takes approximately one hour. There are no restrictions for this procedure.      Thank you for choosing [facility] !

## 2015-04-12 ENCOUNTER — Ambulatory Visit (HOSPITAL_COMMUNITY)
Admission: RE | Admit: 2015-04-12 | Discharge: 2015-04-12 | Disposition: A | Discharge status: Home / Self Care | Payer: Medicare Other | Source: Ambulatory Visit | Attending: Cardiovascular Disease | Admitting: Cardiovascular Disease

## 2015-04-12 DIAGNOSIS — I255 Ischemic cardiomyopathy: Secondary | ICD-10-CM

## 2015-04-12 DIAGNOSIS — I10 Essential (primary) hypertension: Secondary | ICD-10-CM | POA: Diagnosis not present

## 2015-04-12 DIAGNOSIS — I2589 Other forms of chronic ischemic heart disease: Secondary | ICD-10-CM | POA: Diagnosis not present

## 2015-04-12 DIAGNOSIS — E785 Hyperlipidemia, unspecified: Secondary | ICD-10-CM | POA: Diagnosis not present

## 2015-04-12 DIAGNOSIS — Z87891 Personal history of nicotine dependence: Secondary | ICD-10-CM | POA: Diagnosis not present

## 2015-04-12 DIAGNOSIS — I259 Chronic ischemic heart disease, unspecified: Secondary | ICD-10-CM | POA: Diagnosis not present

## 2015-04-12 NOTE — Progress Notes (Signed)
*  PRELIMINARY RESULTS* Echocardiogram 2D Echocardiogram has been performed.  Leavy Cella 04/12/2015, 10:23 AM

## 2015-05-25 DIAGNOSIS — Z79899 Other long term (current) drug therapy: Secondary | ICD-10-CM | POA: Diagnosis not present

## 2015-05-25 DIAGNOSIS — I5022 Chronic systolic (congestive) heart failure: Secondary | ICD-10-CM | POA: Diagnosis not present

## 2015-05-25 DIAGNOSIS — E119 Type 2 diabetes mellitus without complications: Secondary | ICD-10-CM | POA: Diagnosis not present

## 2015-05-25 DIAGNOSIS — I2510 Atherosclerotic heart disease of native coronary artery without angina pectoris: Secondary | ICD-10-CM | POA: Diagnosis not present

## 2015-05-25 DIAGNOSIS — I10 Essential (primary) hypertension: Secondary | ICD-10-CM | POA: Diagnosis not present

## 2015-06-01 ENCOUNTER — Other Ambulatory Visit (HOSPITAL_COMMUNITY): Payer: Self-pay | Admitting: Internal Medicine

## 2015-06-01 DIAGNOSIS — I2510 Atherosclerotic heart disease of native coronary artery without angina pectoris: Secondary | ICD-10-CM | POA: Diagnosis not present

## 2015-06-01 DIAGNOSIS — F3340 Major depressive disorder, recurrent, in remission, unspecified: Secondary | ICD-10-CM | POA: Diagnosis not present

## 2015-06-01 DIAGNOSIS — R0989 Other specified symptoms and signs involving the circulatory and respiratory systems: Secondary | ICD-10-CM

## 2015-06-01 DIAGNOSIS — I255 Ischemic cardiomyopathy: Secondary | ICD-10-CM | POA: Diagnosis not present

## 2015-06-07 ENCOUNTER — Ambulatory Visit (HOSPITAL_COMMUNITY): Payer: Medicare Other

## 2015-06-14 ENCOUNTER — Ambulatory Visit (HOSPITAL_COMMUNITY)
Admission: RE | Admit: 2015-06-14 | Discharge: 2015-06-14 | Disposition: A | Discharge status: Home / Self Care | Payer: Medicare Other | Source: Ambulatory Visit | Attending: Internal Medicine | Admitting: Internal Medicine

## 2015-06-14 DIAGNOSIS — R0989 Other specified symptoms and signs involving the circulatory and respiratory systems: Secondary | ICD-10-CM | POA: Diagnosis not present

## 2015-06-14 DIAGNOSIS — I6523 Occlusion and stenosis of bilateral carotid arteries: Secondary | ICD-10-CM | POA: Diagnosis not present

## 2015-08-15 ENCOUNTER — Encounter (INDEPENDENT_AMBULATORY_CARE_PROVIDER_SITE_OTHER): Payer: Self-pay | Admitting: *Deleted

## 2015-08-15 ENCOUNTER — Encounter (INDEPENDENT_AMBULATORY_CARE_PROVIDER_SITE_OTHER): Payer: Self-pay

## 2015-08-22 ENCOUNTER — Other Ambulatory Visit (INDEPENDENT_AMBULATORY_CARE_PROVIDER_SITE_OTHER): Payer: Self-pay | Admitting: *Deleted

## 2015-08-22 ENCOUNTER — Telehealth (INDEPENDENT_AMBULATORY_CARE_PROVIDER_SITE_OTHER): Payer: Self-pay | Admitting: *Deleted

## 2015-08-22 DIAGNOSIS — Z1211 Encounter for screening for malignant neoplasm of colon: Secondary | ICD-10-CM

## 2015-08-22 NOTE — Telephone Encounter (Signed)
Patient needs trilyte 

## 2015-08-28 ENCOUNTER — Telehealth (INDEPENDENT_AMBULATORY_CARE_PROVIDER_SITE_OTHER): Payer: Self-pay | Admitting: *Deleted

## 2015-08-28 NOTE — Telephone Encounter (Signed)
Referring MD/PCP: fagan   Procedure: tcs  Reason/Indication:  Hx polyps  Has patient had this procedure before?  Yes, 2011 -- scanned  If so, when, by whom and where?    Is there a family history of colon cancer?  no  Who?  What age when diagnosed?    Is patient diabetic?   no      Does patient have prosthetic heart valve?  no  Do you have a pacemaker?  no  Has patient ever had endocarditis? no  Has patient had joint replacement within last 12 months?  no  Does patient tend to be constipated or take laxatives? no  Does patient have a history of alcohol/drug use?   Is patient on Coumadin, Plavix and/or Aspirin? yes  Medications: asa 81 mg daily, metoprolol 25 mg daily, simvastatin 40 mg daily, losartan 50 mg daily, amitriptyline 50 mg daily  Allergies: nkda  Medication Adjustment: asa 2 days  Procedure date & time: 09/28/15 at 955

## 2015-08-30 MED ORDER — PEG 3350-KCL-NA BICARB-NACL 420 G PO SOLR: 4000.0000 mL | Freq: Once | ORAL | Status: DC

## 2015-08-30 NOTE — Telephone Encounter (Signed)
agree

## 2015-09-26 DIAGNOSIS — E119 Type 2 diabetes mellitus without complications: Secondary | ICD-10-CM | POA: Diagnosis not present

## 2015-09-28 ENCOUNTER — Ambulatory Visit (HOSPITAL_COMMUNITY)
Admission: RE | Admit: 2015-09-28 | Discharge: 2015-09-28 | Disposition: A | Discharge status: Home / Self Care | Payer: Medicare Other | Source: Ambulatory Visit | Attending: Internal Medicine | Admitting: Internal Medicine

## 2015-09-28 ENCOUNTER — Encounter (HOSPITAL_COMMUNITY)
Admission: RE | Disposition: A | Discharge status: Home / Self Care | Payer: Self-pay | Source: Ambulatory Visit | Attending: Internal Medicine

## 2015-09-28 ENCOUNTER — Encounter (HOSPITAL_COMMUNITY): Payer: Self-pay | Admitting: *Deleted

## 2015-09-28 DIAGNOSIS — Z87891 Personal history of nicotine dependence: Secondary | ICD-10-CM | POA: Diagnosis not present

## 2015-09-28 DIAGNOSIS — E785 Hyperlipidemia, unspecified: Secondary | ICD-10-CM | POA: Diagnosis not present

## 2015-09-28 DIAGNOSIS — K5730 Diverticulosis of large intestine without perforation or abscess without bleeding: Secondary | ICD-10-CM | POA: Insufficient documentation

## 2015-09-28 DIAGNOSIS — E119 Type 2 diabetes mellitus without complications: Secondary | ICD-10-CM | POA: Insufficient documentation

## 2015-09-28 DIAGNOSIS — Z1231 Encounter for screening mammogram for malignant neoplasm of breast: Secondary | ICD-10-CM | POA: Diagnosis not present

## 2015-09-28 DIAGNOSIS — Z79899 Other long term (current) drug therapy: Secondary | ICD-10-CM | POA: Diagnosis not present

## 2015-09-28 DIAGNOSIS — Z1211 Encounter for screening for malignant neoplasm of colon: Secondary | ICD-10-CM

## 2015-09-28 DIAGNOSIS — Z7982 Long term (current) use of aspirin: Secondary | ICD-10-CM | POA: Insufficient documentation

## 2015-09-28 DIAGNOSIS — I10 Essential (primary) hypertension: Secondary | ICD-10-CM | POA: Diagnosis not present

## 2015-09-28 DIAGNOSIS — D122 Benign neoplasm of ascending colon: Secondary | ICD-10-CM | POA: Diagnosis not present

## 2015-09-28 DIAGNOSIS — K644 Residual hemorrhoidal skin tags: Secondary | ICD-10-CM | POA: Diagnosis not present

## 2015-09-28 DIAGNOSIS — D123 Benign neoplasm of transverse colon: Secondary | ICD-10-CM | POA: Diagnosis not present

## 2015-09-28 DIAGNOSIS — I2510 Atherosclerotic heart disease of native coronary artery without angina pectoris: Secondary | ICD-10-CM | POA: Diagnosis not present

## 2015-09-28 HISTORY — PX: COLONOSCOPY: SHX5424

## 2015-09-28 HISTORY — DX: Atherosclerotic heart disease of native coronary artery without angina pectoris: I25.10

## 2015-09-28 HISTORY — DX: Acute myocardial infarction, unspecified: I21.9

## 2015-09-28 LAB — GLUCOSE, CAPILLARY: GLUCOSE-CAPILLARY: 108 mg/dL — AB (ref 65–99)

## 2015-09-28 SURGERY — COLONOSCOPY
Anesthesia: Moderate Sedation

## 2015-09-28 MED ORDER — SODIUM CHLORIDE 0.9 % IV SOLN
INTRAVENOUS | Status: DC
  Administered 2015-09-28: 1000 mL via INTRAVENOUS

## 2015-09-28 MED ORDER — STERILE WATER FOR IRRIGATION IR SOLN
Status: DC | PRN
  Administered 2015-09-28: 10:00:00

## 2015-09-28 MED ORDER — MIDAZOLAM HCL 5 MG/5ML IJ SOLN
INTRAMUSCULAR | Status: AC
  Filled 2015-09-28: qty 10

## 2015-09-28 MED ORDER — MEPERIDINE HCL 50 MG/ML IJ SOLN
INTRAMUSCULAR | Status: DC
Start: 2015-09-28 — End: 2015-09-28
  Filled 2015-09-28: qty 1

## 2015-09-28 MED ORDER — MIDAZOLAM HCL 5 MG/5ML IJ SOLN
INTRAMUSCULAR | Status: DC | PRN
  Administered 2015-09-28: 2 mg via INTRAVENOUS
  Administered 2015-09-28: 1 mg via INTRAVENOUS

## 2015-09-28 MED ORDER — MEPERIDINE HCL 50 MG/ML IJ SOLN
INTRAMUSCULAR | Status: DC | PRN
  Administered 2015-09-28: 25 mg

## 2015-09-28 NOTE — H&P (Signed)
Jimmy Knox is an 81 y.o. male.   Chief Complaint:  Patient is here for colonoscopy. HPI:  Patient is 81 year old Caucasian male who has history of colonic polyps and is here for colonoscopy. His last exam was over 5 years ago. He denies abdominal pain change in bowel habits or rectal bleeding.  Family history is negative for CRC.  Past Medical History  Diagnosis Date  . Arteriosclerotic cardiovascular disease (ASCVD)     Stent in approximately 2000  . Hypertension   . Hyperlipidemia   . Fasting hyperglycemia     diet-controlled  . Diabetes mellitus without complication   . Coronary artery disease   . Myocardial infarction     Past Surgical History  Procedure Laterality Date  . Carotid stent    . Back surgery      Family History  Problem Relation Age of Onset  . Diabetes Mellitus II Brother     x2 brothers  . Alzheimer's disease Mother    Social History:  reports that he quit smoking about 46 years ago. His smoking use included Cigarettes. He started smoking about 71 years ago. He has a 10 pack-year smoking history. He has never used smokeless tobacco. He reports that he does not drink alcohol or use illicit drugs.  Allergies: No Known Allergies  Medications Prior to Admission  Medication Sig Dispense Refill  . amitriptyline (ELAVIL) 50 MG tablet Take 50 mg by mouth at bedtime.    Marland Kitchen aspirin 81 MG chewable tablet Chew 81 mg by mouth daily.    Marland Kitchen losartan (COZAAR) 50 MG tablet Take 50 mg by mouth daily.    . metoprolol succinate (TOPROL-XL) 25 MG 24 hr tablet Take 25 mg by mouth daily.    . polyethylene glycol-electrolytes (NULYTELY/GOLYTELY) 420 G solution Take 4,000 mLs by mouth once. 4000 mL 0  . simvastatin (ZOCOR) 40 MG tablet Take 20 mg by mouth every evening.       Results for orders placed or performed during the hospital encounter of 09/28/15 (from the past 48 hour(s))  Glucose, capillary     Status: Abnormal   Collection Time: 09/28/15  8:44 AM  Result  Value Ref Range   Glucose-Capillary 108 (H) 65 - 99 mg/dL   No results found.  ROS  Blood pressure 120/74, pulse 75, temperature 97.7 F (36.5 C), temperature source Oral, resp. rate 20, height 6\' 1"  (1.854 m), weight 210 lb (95.255 kg), SpO2 98 %. Physical Exam  Constitutional: He appears well-developed and well-nourished.  HENT:  Mouth/Throat: Oropharynx is clear and moist.  Eyes: Conjunctivae are normal. No scleral icterus.  Neck: No thyromegaly present.  Cardiovascular: Normal rate, regular rhythm and normal heart sounds.   No murmur heard. Respiratory: Effort normal and breath sounds normal.  GI: Soft. He exhibits no distension and no mass. There is no tenderness.  Musculoskeletal: He exhibits no edema.  Lymphadenopathy:    He has no cervical adenopathy.  Neurological: He is alert.  Skin: Skin is warm and dry.     Assessment/Plan  History of colonic adenomas.   Surveillance colonoscopy.  REHMAN,NAJEEB U 09/28/2015, 9:40 AM

## 2015-09-28 NOTE — Discharge Instructions (Signed)
Resume usual medications and high fiber diet. °No driving for 24 hours. °Physician will call with biopsy results. ° °Colonoscopy, Care After °Refer to this sheet in the next few weeks. These instructions provide you with information on caring for yourself after your procedure. Your health care provider may also give you more specific instructions. Your treatment has been planned according to current medical practices, but problems sometimes occur. Call your health care provider if you have any problems or questions after your procedure. °WHAT TO EXPECT AFTER THE PROCEDURE  °After your procedure, it is typical to have the following: °· A small amount of blood in your stool. °· Moderate amounts of gas and mild abdominal cramping or bloating. °HOME CARE INSTRUCTIONS °· Do not drive, operate machinery, or sign important documents for 24 hours. °· You may shower and resume your regular physical activities, but move at a slower pace for the first 24 hours. °· Take frequent rest periods for the first 24 hours. °· Walk around or put a warm pack on your abdomen to help reduce abdominal cramping and bloating. °· Drink enough fluids to keep your urine clear or pale yellow. °· You may resume your normal diet as instructed by your health care provider. Avoid heavy or fried foods that are hard to digest. °· Avoid drinking alcohol for 24 hours or as instructed by your health care provider. °· Only take over-the-counter or prescription medicines as directed by your health care provider. °· If a tissue sample (biopsy) was taken during your procedure: °· Do not take aspirin or blood thinners for 7 days, or as instructed by your health care provider. °· Do not drink alcohol for 7 days, or as instructed by your health care provider. °· Eat soft foods for the first 24 hours. °SEEK MEDICAL CARE IF: °You have persistent spotting of blood in your stool 2-3 days after the procedure. °SEEK IMMEDIATE MEDICAL CARE IF: °· You have more than a  small spotting of blood in your stool. °· You pass large blood clots in your stool. °· Your abdomen is swollen (distended). °· You have nausea or vomiting. °· You have a fever. °· You have increasing abdominal pain that is not relieved with medicine. °Document Released: 07/29/2004 Document Revised: 10/05/2013 Document Reviewed: 08/22/2013 °ExitCare® Patient Information ©2015 [employer]. This information is not intended to replace advice given to you by your health care provider. Make sure you discuss any questions you have with your health care provider. ° °High-Fiber Diet °Fiber is found in fruits, vegetables, and grains. A high-fiber diet encourages the addition of more whole grains, legumes, fruits, and vegetables in your diet. The recommended amount of fiber for adult males is 38 g per day. For adult females, it is 25 g per day. Pregnant and lactating women should get 28 g of fiber per day. If you have a digestive or bowel problem, ask your caregiver for advice before adding high-fiber foods to your diet. Eat a variety of high-fiber foods instead of only a select few type of foods.  °PURPOSE °· To increase stool bulk. °· To make bowel movements more regular to prevent constipation. °· To lower cholesterol. °· To prevent overeating. °WHEN IS THIS DIET USED? °· It may be used if you have constipation and hemorrhoids. °· It may be used if you have uncomplicated diverticulosis (intestine condition) and irritable bowel syndrome. °· It may be used if you need help with weight management. °· It may be used if you want to   add it to your diet as a protective measure against atherosclerosis, diabetes, and cancer. °SOURCES OF FIBER °· Whole-grain breads and cereals. °· Fruits, such as apples, oranges, bananas, berries, prunes, and pears. °· Vegetables, such as green peas, carrots, sweet potatoes, beets, broccoli, cabbage, spinach, and artichokes. °· Legumes, such split peas, soy, lentils. °· Almonds. °FIBER CONTENT IN  FOODS °Starches and Grains / Dietary Fiber (g) °· Cheerios, 1 cup / 3 g °· Corn Flakes cereal, 1 cup / 0.7 g °· Rice crispy treat cereal, 1¼ cup / 0.3 g °· Instant oatmeal (cooked), ½ cup / 2 g °· Frosted wheat cereal, 1 cup / 5.1 g °· Brown, long-grain rice (cooked), 1 cup / 3.5 g °· White, long-grain rice (cooked), 1 cup / 0.6 g °· Enriched macaroni (cooked), 1 cup / 2.5 g °Legumes / Dietary Fiber (g) °· Baked beans (canned, plain, or vegetarian), ½ cup / 5.2 g °· Kidney beans (canned), ½ cup / 6.8 g °· Pinto beans (cooked), ½ cup / 5.5 g °Breads and Crackers / Dietary Fiber (g) °· Plain or honey graham crackers, 2 squares / 0.7 g °· Saltine crackers, 3 squares / 0.3 g °· Plain, salted pretzels, 10 pieces / 1.8 g °· Whole-wheat bread, 1 slice / 1.9 g °· White bread, 1 slice / 0.7 g °· Raisin bread, 1 slice / 1.2 g °· Plain bagel, 3 oz / 2 g °· Flour tortilla, 1 oz / 0.9 g °· Corn tortilla, 1 small / 1.5 g °· Hamburger or hotdog bun, 1 small / 0.9 g °Fruits / Dietary Fiber (g) °· Apple with skin, 1 medium / 4.4 g °· Sweetened applesauce, ½ cup / 1.5 g °· Banana, ½ medium / 1.5 g °· Grapes, 10 grapes / 0.4 g °· Orange, 1 small / 2.3 g °· Raisin, 1.5 oz / 1.6 g °· Melon, 1 cup / 1.4 g °Vegetables / Dietary Fiber (g) °· Green beans (canned), ½ cup / 1.3 g °· Carrots (cooked), ½ cup / 2.3 g °· Broccoli (cooked), ½ cup / 2.8 g °· Peas (cooked), ½ cup / 4.4 g °· Mashed potatoes, ½ cup / 1.6 g °· Lettuce, 1 cup / 0.5 g °· Corn (canned), ½ cup / 1.6 g °· Tomato, ½ cup / 1.1 g °Document Released: 12/15/2005 Document Revised: 06/15/2012 Document Reviewed: 03/18/2012 °ExitCare® Patient Information ©2015 [employer]. This information is not intended to replace advice given to you by your health care provider. Make sure you discuss any questions you have with your health care provider. ° °

## 2015-09-28 NOTE — Op Note (Signed)
COLONOSCOPY PROCEDURE REPORT  PATIENT:  Jimmy Knox  MR#:  [id number] Birthdate:  Nov 06, 1932, 81 y.o., male Endoscopist:  Dr. Rogene Houston, MD Referred By:  Dr.  Asencion Noble, MD Procedure Date: 09/28/2015  Procedure:   Colonoscopy  Indications:   Patient is 81 year old Caucasian male was at colonic adenomas on prior colonoscopies. Last exam was in July 2011 with removal of 2 tubular adenomas.  Informed Consent:  The procedure and risks were reviewed with the patient and informed consent was obtained.  Medications:  Demerol 25 mg IV Versed 3 mg IV  Description of procedure:  After a digital rectal exam was performed, that colonoscope was advanced from the anus through the rectum and colon to the area of the cecum, ileocecal valve and appendiceal orifice. The cecum was deeply intubated. These structures were well-seen and photographed for the record. From the level of the cecum and ileocecal valve, the scope was slowly and cautiously withdrawn. The mucosal surfaces were carefully surveyed utilizing scope tip to flexion to facilitate fold flattening as needed. The scope was pulled down into the rectum where a thorough exam including retroflexion was performed.  Findings:   Prep fair to satisfactory.  5 mm polyp cold snare from ascending colon.  This polyp was lost.  Small polyp at transverse colon was ablated via cold biopsy.   Therapeutic/Diagnostic Maneuvers Performed:  See above  Complications:   none  EBL: None  Cecal Withdrawal Time:  11 minutes  Impression:   Examination performed to cecum.  Small polyp cold snare from ascending colon. This polyp was lost.  Small polyp removed with cold biopsy forceps from distal transverse colon.  mild sigmoid colon diverticulosis.  Small external hemorrhoids.  Recommendations:  Standard instructions given. I will contact patient with biopsy results and further recommendations.  REHMAN,NAJEEB U  09/28/2015 10:20 AM  CC: Dr.  Asencion Noble, MD & Dr. Rayne Du ref. provider found

## 2015-10-03 ENCOUNTER — Encounter (HOSPITAL_COMMUNITY): Payer: Self-pay | Admitting: Internal Medicine

## 2015-10-04 DIAGNOSIS — I2510 Atherosclerotic heart disease of native coronary artery without angina pectoris: Secondary | ICD-10-CM | POA: Diagnosis not present

## 2015-10-04 DIAGNOSIS — Z6830 Body mass index (BMI) 30.0-30.9, adult: Secondary | ICD-10-CM | POA: Diagnosis not present

## 2015-10-04 DIAGNOSIS — Z23 Encounter for immunization: Secondary | ICD-10-CM | POA: Diagnosis not present

## 2015-10-04 DIAGNOSIS — I10 Essential (primary) hypertension: Secondary | ICD-10-CM | POA: Diagnosis not present

## 2015-10-04 DIAGNOSIS — E119 Type 2 diabetes mellitus without complications: Secondary | ICD-10-CM | POA: Diagnosis not present

## 2015-10-15 ENCOUNTER — Encounter: Payer: Self-pay | Admitting: Cardiovascular Disease

## 2015-10-15 ENCOUNTER — Ambulatory Visit (INDEPENDENT_AMBULATORY_CARE_PROVIDER_SITE_OTHER): Payer: Medicare Other | Admitting: Cardiovascular Disease

## 2015-10-15 VITALS — BP 104/58 | HR 82 | Ht 73.0 in | Wt 215.6 lb

## 2015-10-15 DIAGNOSIS — I255 Ischemic cardiomyopathy: Secondary | ICD-10-CM | POA: Diagnosis not present

## 2015-10-15 DIAGNOSIS — R5383 Other fatigue: Secondary | ICD-10-CM | POA: Diagnosis not present

## 2015-10-15 DIAGNOSIS — I10 Essential (primary) hypertension: Secondary | ICD-10-CM | POA: Diagnosis not present

## 2015-10-15 DIAGNOSIS — E785 Hyperlipidemia, unspecified: Secondary | ICD-10-CM

## 2015-10-15 DIAGNOSIS — I252 Old myocardial infarction: Secondary | ICD-10-CM

## 2015-10-15 DIAGNOSIS — I25118 Atherosclerotic heart disease of native coronary artery with other forms of angina pectoris: Secondary | ICD-10-CM

## 2015-10-15 DIAGNOSIS — R0609 Other forms of dyspnea: Secondary | ICD-10-CM

## 2015-10-15 NOTE — Progress Notes (Signed)
Patient ID: Jimmy Knox, male   DOB: Oct 22, 1932, 81 y.o.   MRN: [id number]      SUBJECTIVE: Mr. Throgmorton has a h/o CAD with a prior MI and stent around 2000, carotid artery disease (less than 50% stenosis bilaterally on 06/14/15), ischemic cardiomyopathy, essential HTN, and hyperlipidemia.  His most recent echocardiogram on 04/12/15 revealed an EF of 40% with grade 1 diastolic dysfunction.Marland Kitchen   His most recent stress test on 02/16/13 revealed myocardial scar without ischemia.   He denies any symptoms of chest pain, palpitations, dizziness, leg swelling, PND, and syncope.  However, his wife mentions that she has noticed a subtle decline over the past 2 years in his exertional ability. He became short of breath when walking from the parking lot to our office. Symptoms are somewhat similar prior to MI.  They will be attending their granddaughters' tennis tournament this weekend.   SocHx: Married for 56 years this past June 2016. Has 2 children and 3 granddaughters who live fairly close to them.    Review of Systems: As per "subjective", otherwise negative.  No Known Allergies  Current Outpatient Prescriptions  Medication Sig Dispense Refill  . amitriptyline (ELAVIL) 50 MG tablet Take 50 mg by mouth at bedtime.    Marland Kitchen aspirin 81 MG chewable tablet Chew 81 mg by mouth daily.    Marland Kitchen losartan (COZAAR) 50 MG tablet Take 50 mg by mouth daily.    . metoprolol succinate (TOPROL-XL) 25 MG 24 hr tablet Take 25 mg by mouth daily.    . simvastatin (ZOCOR) 40 MG tablet Take 20 mg by mouth every evening.      No current facility-administered medications for this visit.    Past Medical History  Diagnosis Date  . Arteriosclerotic cardiovascular disease (ASCVD)     Stent in approximately 2000  . Hypertension   . Hyperlipidemia   . Fasting hyperglycemia     diet-controlled  . Diabetes mellitus without complication ([place])   . Coronary artery disease   . Myocardial infarction [facility])     Past  Surgical History  Procedure Laterality Date  . Carotid stent    . Back surgery    . Colonoscopy N/A 09/28/2015    Procedure: COLONOSCOPY;  Surgeon: Rogene Houston, MD;  Location[facility];  Service: Endoscopy;  Laterality: N/A;  955    Social History   Social History  . Marital Status: Married    Spouse Name: N/A  . Number of Children: N/A  . Years of Education: N/A   Occupational History  . Retired    Social History Main Topics  . Smoking status: Former Smoker -- 0.50 packs/day for 20 years    Types: Cigarettes    Start date: 12/30/1943    Quit date: 12/29/1968  . Smokeless tobacco: Never Used  . Alcohol Use: No  . Drug Use: No  . Sexual Activity: Not on file   Other Topics Concern  . Not on file   Social History Narrative     Filed Vitals:   10/15/15 0847  BP: 104/58  Pulse: 82  Height: 6\' 1"  (1.854 m)  Weight: 215 lb 9.6 oz (97.796 kg)  SpO2: 93%    PHYSICAL EXAM General: NAD HEENT: Normal. Neck: No JVD, no thyromegaly. Lungs: Clear to auscultation bilaterally with normal respiratory effort. CV: Nondisplaced PMI.  Regular rate and rhythm, normal S1/S2, no S3/S4, no murmur. No pretibial or periankle edema.  No carotid bruit.    Abdomen: Soft, nontender, no  distention.  Neurologic: Alert and oriented x 3.  Psych: Normal affect. Skin: Normal. Musculoskeletal: Normal range of motion, no gross deformities. Extremities: No clubbing or cyanosis.   ECG: Most recent ECG reviewed.      ASSESSMENT AND PLAN: 1. Progressive exertional dyspnea and fatigue in context of CAD, prior MI with ischemic cardiomyopathy, EF 40%: Given progressive exertional dyspnea and fatigue, will obtain a Lexiscan Cardiolite stress test to evaluate for an ischemic etiology. Will continue current therapy. No symptoms of heart failure.   2. Essential HTN: Controlled on current dose of losartan. No changes to therapy required.  3. Hyperlipidemia: On simvastatin. Will obtain copy  of lipids from PCP.  Dispo: f/u 3-4 weeks.  Kate Sable, M.D., F.A.C.C.

## 2015-10-15 NOTE — Patient Instructions (Signed)
Your physician recommends that you schedule a follow-up appointment in: 3-4 weeks with Dr [name]      Your physician recommends that you continue on your current medications as directed. Please refer to the Current Medication list given to you today.      Your physician has requested that you have a lexiscan myoview. For further information please visit [email or web address]. Please follow instruction sheet, as given.      Thank you for choosing [facility] !

## 2015-10-17 ENCOUNTER — Encounter (HOSPITAL_COMMUNITY)
Admission: RE | Admit: 2015-10-17 | Discharge: 2015-10-17 | Disposition: A | Discharge status: Home / Self Care | Payer: Medicare Other | Source: Ambulatory Visit | Attending: Cardiovascular Disease | Admitting: Cardiovascular Disease

## 2015-10-17 ENCOUNTER — Encounter (HOSPITAL_COMMUNITY): Payer: Self-pay

## 2015-10-17 ENCOUNTER — Inpatient Hospital Stay (HOSPITAL_COMMUNITY): Admission: RE | Admit: 2015-10-17 | Payer: Medicare Other | Source: Ambulatory Visit

## 2015-10-17 DIAGNOSIS — R0609 Other forms of dyspnea: Secondary | ICD-10-CM | POA: Insufficient documentation

## 2015-10-17 DIAGNOSIS — I252 Old myocardial infarction: Secondary | ICD-10-CM | POA: Diagnosis not present

## 2015-10-17 DIAGNOSIS — R9439 Abnormal result of other cardiovascular function study: Secondary | ICD-10-CM | POA: Insufficient documentation

## 2015-10-17 DIAGNOSIS — R5383 Other fatigue: Secondary | ICD-10-CM | POA: Insufficient documentation

## 2015-10-17 LAB — NM MYOCAR MULTI W/SPECT W/WALL MOTION / EF
CHL CUP NUCLEAR SRS: 5
CHL CUP NUCLEAR SSS: 9
LHR: 0.4
LV dias vol: 117 mL
LV sys vol: 68 mL
Peak HR: 86 {beats}/min
Rest HR: 68 {beats}/min
SDS: 4
TID: 1.07

## 2015-10-17 MED ORDER — REGADENOSON 0.4 MG/5ML IV SOLN
INTRAVENOUS | Status: AC
  Administered 2015-10-17: 0.4 mg via INTRAVENOUS
  Filled 2015-10-17: qty 5

## 2015-10-17 MED ORDER — TECHNETIUM TC 99M SESTAMIBI GENERIC - CARDIOLITE
30.0000 | Freq: Once | INTRAVENOUS | Status: AC | PRN
  Administered 2015-10-17: 33 via INTRAVENOUS

## 2015-10-17 MED ORDER — SODIUM CHLORIDE 0.9 % IJ SOLN
INTRAMUSCULAR | Status: AC
  Administered 2015-10-17: 10 mL via INTRAVENOUS
  Filled 2015-10-17: qty 3

## 2015-10-17 MED ORDER — TECHNETIUM TC 99M SESTAMIBI - CARDIOLITE
10.0000 | Freq: Once | INTRAVENOUS | Status: AC | PRN
  Administered 2015-10-17: 08:00:00 9 via INTRAVENOUS

## 2015-10-25 DIAGNOSIS — Z85828 Personal history of other malignant neoplasm of skin: Secondary | ICD-10-CM | POA: Diagnosis not present

## 2015-10-25 DIAGNOSIS — C44310 Basal cell carcinoma of skin of unspecified parts of face: Secondary | ICD-10-CM | POA: Diagnosis not present

## 2015-10-25 DIAGNOSIS — Z08 Encounter for follow-up examination after completed treatment for malignant neoplasm: Secondary | ICD-10-CM | POA: Diagnosis not present

## 2015-10-25 DIAGNOSIS — L570 Actinic keratosis: Secondary | ICD-10-CM | POA: Diagnosis not present

## 2015-10-25 DIAGNOSIS — L304 Erythema intertrigo: Secondary | ICD-10-CM | POA: Diagnosis not present

## 2015-10-25 DIAGNOSIS — X32XXXA Exposure to sunlight, initial encounter: Secondary | ICD-10-CM | POA: Diagnosis not present

## 2015-10-25 DIAGNOSIS — C44519 Basal cell carcinoma of skin of other part of trunk: Secondary | ICD-10-CM | POA: Diagnosis not present

## 2015-10-25 DIAGNOSIS — C44311 Basal cell carcinoma of skin of nose: Secondary | ICD-10-CM | POA: Diagnosis not present

## 2015-11-26 DIAGNOSIS — H47231 Glaucomatous optic atrophy, right eye: Secondary | ICD-10-CM | POA: Diagnosis not present

## 2015-11-26 DIAGNOSIS — E119 Type 2 diabetes mellitus without complications: Secondary | ICD-10-CM | POA: Diagnosis not present

## 2015-11-27 ENCOUNTER — Ambulatory Visit: Payer: Medicare Other | Admitting: Cardiovascular Disease

## 2015-11-27 ENCOUNTER — Ambulatory Visit (INDEPENDENT_AMBULATORY_CARE_PROVIDER_SITE_OTHER): Payer: Medicare Other | Admitting: Cardiovascular Disease

## 2015-11-27 VITALS — BP 98/62 | HR 78 | Ht 72.0 in | Wt 211.0 lb

## 2015-11-27 DIAGNOSIS — I10 Essential (primary) hypertension: Secondary | ICD-10-CM | POA: Diagnosis not present

## 2015-11-27 DIAGNOSIS — I25118 Atherosclerotic heart disease of native coronary artery with other forms of angina pectoris: Secondary | ICD-10-CM

## 2015-11-27 DIAGNOSIS — E785 Hyperlipidemia, unspecified: Secondary | ICD-10-CM

## 2015-11-27 DIAGNOSIS — I6523 Occlusion and stenosis of bilateral carotid arteries: Secondary | ICD-10-CM

## 2015-11-27 DIAGNOSIS — R42 Dizziness and giddiness: Secondary | ICD-10-CM

## 2015-11-27 DIAGNOSIS — I255 Ischemic cardiomyopathy: Secondary | ICD-10-CM

## 2015-11-27 DIAGNOSIS — I252 Old myocardial infarction: Secondary | ICD-10-CM | POA: Diagnosis not present

## 2015-11-27 DIAGNOSIS — R0609 Other forms of dyspnea: Secondary | ICD-10-CM

## 2015-11-27 MED ORDER — LOSARTAN POTASSIUM 25 MG PO TABS: 25.0000 mg | ORAL_TABLET | Freq: Every day | ORAL | Status: DC

## 2015-11-27 NOTE — Progress Notes (Signed)
Patient ID: Jimmy Knox, male   DOB: 05-24-1932, 81 y.o.   MRN: [id number]      SUBJECTIVE: The patient returns for follow-up after undergoing cardiovascular testing performed for the evaluation of progressive exertional dyspnea and fatigue in the context of CAD and prior MI.  Nuclear stress test on 10/17/15 showed a large area of myocardial scar primarily in the inferior, inferoseptal, and inferolateral walls extending from the apex to the base. There was a mild degree of peri-infarct ischemia, calculated LVEF 42%. It was an intermediate risk study.  In summary, he has a h/o CAD with a prior MI and stent around 2000, carotid artery disease (less than 50% stenosis bilaterally on 06/14/15), ischemic cardiomyopathy, essential HTN, and hyperlipidemia.  His most recent echocardiogram on 04/12/15 revealed an EF of 40% with grade 1 diastolic dysfunction.  Denies chest pain. Has stable NYHA class II symptoms and can walk 50 yards before having to stop. No leg swelling. Mild dizziness at times, no syncope.   SocHx: Married for 56 years this past June 2016. Has 2 children and 3 granddaughters who live fairly close to them.    Review of Systems: As per "subjective", otherwise negative.  No Known Allergies  Current Outpatient Prescriptions  Medication Sig Dispense Refill  . amitriptyline (ELAVIL) 50 MG tablet Take 50 mg by mouth at bedtime.    Marland Kitchen aspirin 81 MG chewable tablet Chew 81 mg by mouth daily.    Marland Kitchen losartan (COZAAR) 50 MG tablet Take 50 mg by mouth daily.    . metoprolol succinate (TOPROL-XL) 25 MG 24 hr tablet Take 25 mg by mouth daily.    . simvastatin (ZOCOR) 40 MG tablet Take 20 mg by mouth every evening.      No current facility-administered medications for this visit.    Past Medical History  Diagnosis Date  . Arteriosclerotic cardiovascular disease (ASCVD)     Stent in approximately 2000  . Hypertension   . Hyperlipidemia   . Fasting hyperglycemia     diet-controlled    . Diabetes mellitus without complication ([place])   . Coronary artery disease   . Myocardial infarction [facility])     Past Surgical History  Procedure Laterality Date  . Carotid stent    . Back surgery    . Colonoscopy N/A 09/28/2015    Procedure: COLONOSCOPY;  Surgeon: Rogene Houston, MD;  Location: [facility];  Service: Endoscopy;  Laterality: N/A;  955    Social History   Social History  . Marital Status: Married    Spouse Name: N/A  . Number of Children: N/A  . Years of Education: N/A   Occupational History  . Retired    Social History Main Topics  . Smoking status: Former Smoker -- 0.50 packs/day for 20 years    Types: Cigarettes    Start date: 12/30/1943    Quit date: 12/29/1968  . Smokeless tobacco: Never Used  . Alcohol Use: No  . Drug Use: No  . Sexual Activity: Not on file   Other Topics Concern  . Not on file   Social History Narrative     Filed Vitals:   11/27/15 1104  BP: 98/62  Pulse: 78  Height: 6' (1.829 m)  Weight: 211 lb (95.709 kg)  SpO2: 95%    PHYSICAL EXAM General: NAD HEENT: Normal. Neck: No JVD, no thyromegaly. Lungs: Clear to auscultation bilaterally with normal respiratory effort. CV: Nondisplaced PMI.  Regular rate and rhythm, normal S1/S2, no S3/S4, no  murmur. No pretibial or periankle edema.   Abdomen: Soft, nontender, no distention.  Neurologic: Alert and oriented x 3.  Psych: Normal affect. Skin: Normal. Musculoskeletal: No gross deformities. Extremities: No clubbing or cyanosis.   ECG: Most recent ECG reviewed.      ASSESSMENT AND PLAN: 1. Exertional dyspnea and fatigue in context of CAD, prior MI with ischemic cardiomyopathy, EF 40%: Stable NYHA class II symptoms. Lexiscan Cardiolite stress test results noted above with primarily myocardial scar seen, mild peri-infarct ischemia. Will continue current therapy. No symptoms of heart failure.   2. Essential HTN: Low normal on current dose of losartan with mild  dizziness. Will cut back to 25 mg daily.  3. Hyperlipidemia: On simvastatin. LDL 67 on 05/25/15. No changes.  Dispo: f/u 6 months.   Kate Sable, M.D., F.A.C.C.

## 2015-11-27 NOTE — Patient Instructions (Signed)
Your physician wants you to follow-up in: 6 Months with Dr. [name]. You will receive a reminder letter in the mail two months in advance. If you don't receive a letter, please call our office to schedule the follow-up appointment.  Your physician has recommended you make the following change in your medication:   Decrease Losartan to 25 mg Daily.   If you need a refill on your cardiac medications before your next appointment, please call your pharmacy.  Thank you for choosing Home!

## 2015-11-29 DIAGNOSIS — X32XXXD Exposure to sunlight, subsequent encounter: Secondary | ICD-10-CM | POA: Diagnosis not present

## 2015-11-29 DIAGNOSIS — L820 Inflamed seborrheic keratosis: Secondary | ICD-10-CM | POA: Diagnosis not present

## 2015-11-29 DIAGNOSIS — L570 Actinic keratosis: Secondary | ICD-10-CM | POA: Diagnosis not present

## 2015-11-29 DIAGNOSIS — Z85828 Personal history of other malignant neoplasm of skin: Secondary | ICD-10-CM | POA: Diagnosis not present

## 2015-11-29 DIAGNOSIS — Z08 Encounter for follow-up examination after completed treatment for malignant neoplasm: Secondary | ICD-10-CM | POA: Diagnosis not present

## 2016-01-31 DIAGNOSIS — E119 Type 2 diabetes mellitus without complications: Secondary | ICD-10-CM | POA: Diagnosis not present

## 2016-02-07 DIAGNOSIS — I10 Essential (primary) hypertension: Secondary | ICD-10-CM | POA: Diagnosis not present

## 2016-02-07 DIAGNOSIS — Z6829 Body mass index (BMI) 29.0-29.9, adult: Secondary | ICD-10-CM | POA: Diagnosis not present

## 2016-02-07 DIAGNOSIS — E119 Type 2 diabetes mellitus without complications: Secondary | ICD-10-CM | POA: Diagnosis not present

## 2016-03-26 ENCOUNTER — Other Ambulatory Visit (HOSPITAL_COMMUNITY): Payer: Self-pay | Admitting: Orthopaedic Surgery

## 2016-03-26 DIAGNOSIS — M545 Low back pain: Secondary | ICD-10-CM | POA: Diagnosis not present

## 2016-03-26 DIAGNOSIS — M5441 Lumbago with sciatica, right side: Secondary | ICD-10-CM

## 2016-03-26 DIAGNOSIS — M25551 Pain in right hip: Secondary | ICD-10-CM | POA: Diagnosis not present

## 2016-03-26 DIAGNOSIS — M47817 Spondylosis without myelopathy or radiculopathy, lumbosacral region: Secondary | ICD-10-CM | POA: Diagnosis not present

## 2016-04-03 ENCOUNTER — Ambulatory Visit (HOSPITAL_COMMUNITY)
Admission: RE | Admit: 2016-04-03 | Discharge: 2016-04-03 | Disposition: A | Discharge status: Home / Self Care | Payer: Medicare Other | Source: Ambulatory Visit | Attending: Orthopaedic Surgery | Admitting: Orthopaedic Surgery

## 2016-04-03 DIAGNOSIS — M545 Low back pain: Secondary | ICD-10-CM | POA: Diagnosis not present

## 2016-04-03 DIAGNOSIS — M1288 Other specific arthropathies, not elsewhere classified, other specified site: Secondary | ICD-10-CM | POA: Diagnosis not present

## 2016-04-03 DIAGNOSIS — M4806 Spinal stenosis, lumbar region: Secondary | ICD-10-CM | POA: Diagnosis not present

## 2016-04-03 DIAGNOSIS — M5441 Lumbago with sciatica, right side: Secondary | ICD-10-CM

## 2016-04-03 DIAGNOSIS — M5126 Other intervertebral disc displacement, lumbar region: Secondary | ICD-10-CM | POA: Insufficient documentation

## 2016-04-15 DIAGNOSIS — M5416 Radiculopathy, lumbar region: Secondary | ICD-10-CM | POA: Diagnosis not present

## 2016-04-15 DIAGNOSIS — M545 Low back pain: Secondary | ICD-10-CM | POA: Diagnosis not present

## 2016-04-15 DIAGNOSIS — R2689 Other abnormalities of gait and mobility: Secondary | ICD-10-CM | POA: Diagnosis not present

## 2016-04-17 DIAGNOSIS — R2689 Other abnormalities of gait and mobility: Secondary | ICD-10-CM | POA: Diagnosis not present

## 2016-04-17 DIAGNOSIS — M545 Low back pain: Secondary | ICD-10-CM | POA: Diagnosis not present

## 2016-04-17 DIAGNOSIS — M5416 Radiculopathy, lumbar region: Secondary | ICD-10-CM | POA: Diagnosis not present

## 2016-04-21 DIAGNOSIS — M5416 Radiculopathy, lumbar region: Secondary | ICD-10-CM | POA: Diagnosis not present

## 2016-04-21 DIAGNOSIS — R2689 Other abnormalities of gait and mobility: Secondary | ICD-10-CM | POA: Diagnosis not present

## 2016-04-21 DIAGNOSIS — M545 Low back pain: Secondary | ICD-10-CM | POA: Diagnosis not present

## 2016-04-23 DIAGNOSIS — M5416 Radiculopathy, lumbar region: Secondary | ICD-10-CM | POA: Diagnosis not present

## 2016-04-23 DIAGNOSIS — M4806 Spinal stenosis, lumbar region: Secondary | ICD-10-CM | POA: Diagnosis not present

## 2016-04-29 DIAGNOSIS — M545 Low back pain: Secondary | ICD-10-CM | POA: Diagnosis not present

## 2016-04-29 DIAGNOSIS — M5416 Radiculopathy, lumbar region: Secondary | ICD-10-CM | POA: Diagnosis not present

## 2016-04-29 DIAGNOSIS — R2689 Other abnormalities of gait and mobility: Secondary | ICD-10-CM | POA: Diagnosis not present

## 2016-05-01 DIAGNOSIS — M545 Low back pain: Secondary | ICD-10-CM | POA: Diagnosis not present

## 2016-05-01 DIAGNOSIS — M5416 Radiculopathy, lumbar region: Secondary | ICD-10-CM | POA: Diagnosis not present

## 2016-05-01 DIAGNOSIS — R2689 Other abnormalities of gait and mobility: Secondary | ICD-10-CM | POA: Diagnosis not present

## 2016-05-07 DIAGNOSIS — M545 Low back pain: Secondary | ICD-10-CM | POA: Diagnosis not present

## 2016-05-07 DIAGNOSIS — M47817 Spondylosis without myelopathy or radiculopathy, lumbosacral region: Secondary | ICD-10-CM | POA: Diagnosis not present

## 2016-05-20 DIAGNOSIS — M25551 Pain in right hip: Secondary | ICD-10-CM | POA: Diagnosis not present

## 2016-05-28 DIAGNOSIS — M545 Low back pain: Secondary | ICD-10-CM | POA: Diagnosis not present

## 2016-05-28 DIAGNOSIS — M1711 Unilateral primary osteoarthritis, right knee: Secondary | ICD-10-CM | POA: Diagnosis not present

## 2016-05-28 DIAGNOSIS — H47231 Glaucomatous optic atrophy, right eye: Secondary | ICD-10-CM | POA: Diagnosis not present

## 2016-05-28 DIAGNOSIS — M47817 Spondylosis without myelopathy or radiculopathy, lumbosacral region: Secondary | ICD-10-CM | POA: Diagnosis not present

## 2016-05-28 DIAGNOSIS — M25561 Pain in right knee: Secondary | ICD-10-CM | POA: Diagnosis not present

## 2016-06-05 DIAGNOSIS — X32XXXD Exposure to sunlight, subsequent encounter: Secondary | ICD-10-CM | POA: Diagnosis not present

## 2016-06-05 DIAGNOSIS — Z08 Encounter for follow-up examination after completed treatment for malignant neoplasm: Secondary | ICD-10-CM | POA: Diagnosis not present

## 2016-06-05 DIAGNOSIS — Z85828 Personal history of other malignant neoplasm of skin: Secondary | ICD-10-CM | POA: Diagnosis not present

## 2016-06-05 DIAGNOSIS — L570 Actinic keratosis: Secondary | ICD-10-CM | POA: Diagnosis not present

## 2016-06-06 DIAGNOSIS — I2510 Atherosclerotic heart disease of native coronary artery without angina pectoris: Secondary | ICD-10-CM | POA: Diagnosis not present

## 2016-06-06 DIAGNOSIS — I10 Essential (primary) hypertension: Secondary | ICD-10-CM | POA: Diagnosis not present

## 2016-06-06 DIAGNOSIS — E119 Type 2 diabetes mellitus without complications: Secondary | ICD-10-CM | POA: Diagnosis not present

## 2016-06-06 DIAGNOSIS — I779 Disorder of arteries and arterioles, unspecified: Secondary | ICD-10-CM | POA: Diagnosis not present

## 2016-06-06 DIAGNOSIS — Z79899 Other long term (current) drug therapy: Secondary | ICD-10-CM | POA: Diagnosis not present

## 2016-06-06 DIAGNOSIS — E785 Hyperlipidemia, unspecified: Secondary | ICD-10-CM | POA: Diagnosis not present

## 2016-06-13 ENCOUNTER — Other Ambulatory Visit (HOSPITAL_COMMUNITY): Payer: Self-pay | Admitting: Internal Medicine

## 2016-06-13 DIAGNOSIS — E119 Type 2 diabetes mellitus without complications: Secondary | ICD-10-CM | POA: Diagnosis not present

## 2016-06-13 DIAGNOSIS — Z6829 Body mass index (BMI) 29.0-29.9, adult: Secondary | ICD-10-CM | POA: Diagnosis not present

## 2016-06-13 DIAGNOSIS — I6523 Occlusion and stenosis of bilateral carotid arteries: Secondary | ICD-10-CM

## 2016-06-13 DIAGNOSIS — I2510 Atherosclerotic heart disease of native coronary artery without angina pectoris: Secondary | ICD-10-CM | POA: Diagnosis not present

## 2016-06-13 DIAGNOSIS — I10 Essential (primary) hypertension: Secondary | ICD-10-CM | POA: Diagnosis not present

## 2016-06-20 ENCOUNTER — Ambulatory Visit (HOSPITAL_COMMUNITY)
Admission: RE | Admit: 2016-06-20 | Discharge: 2016-06-20 | Disposition: A | Discharge status: Home / Self Care | Payer: Medicare Other | Source: Ambulatory Visit | Attending: Internal Medicine | Admitting: Internal Medicine

## 2016-06-20 DIAGNOSIS — I6523 Occlusion and stenosis of bilateral carotid arteries: Secondary | ICD-10-CM | POA: Insufficient documentation

## 2016-07-09 DIAGNOSIS — M25551 Pain in right hip: Secondary | ICD-10-CM | POA: Diagnosis not present

## 2016-07-14 ENCOUNTER — Ambulatory Visit (INDEPENDENT_AMBULATORY_CARE_PROVIDER_SITE_OTHER): Payer: Medicare Other | Admitting: Cardiovascular Disease

## 2016-07-14 ENCOUNTER — Encounter: Payer: Self-pay | Admitting: Cardiovascular Disease

## 2016-07-14 VITALS — BP 118/82 | HR 70 | Ht 72.0 in | Wt 214.0 lb

## 2016-07-14 DIAGNOSIS — R0609 Other forms of dyspnea: Secondary | ICD-10-CM

## 2016-07-14 DIAGNOSIS — I252 Old myocardial infarction: Secondary | ICD-10-CM | POA: Diagnosis not present

## 2016-07-14 DIAGNOSIS — I6523 Occlusion and stenosis of bilateral carotid arteries: Secondary | ICD-10-CM

## 2016-07-14 DIAGNOSIS — M25473 Effusion, unspecified ankle: Secondary | ICD-10-CM

## 2016-07-14 DIAGNOSIS — I255 Ischemic cardiomyopathy: Secondary | ICD-10-CM | POA: Diagnosis not present

## 2016-07-14 DIAGNOSIS — I10 Essential (primary) hypertension: Secondary | ICD-10-CM

## 2016-07-14 DIAGNOSIS — Z01818 Encounter for other preprocedural examination: Secondary | ICD-10-CM

## 2016-07-14 DIAGNOSIS — E785 Hyperlipidemia, unspecified: Secondary | ICD-10-CM

## 2016-07-14 MED ORDER — FUROSEMIDE 20 MG PO TABS: 20.0000 mg | ORAL_TABLET | Freq: Every day | ORAL | Status: DC

## 2016-07-14 NOTE — Patient Instructions (Signed)
Medication Instructions:   Begin Lasix 20mg  daily - new sent to [facility] today.  Continue all other medications.    Labwork: NONE  Testing/Procedures: NONE  Follow-Up: Your physician wants you to follow up in: 6 months.  You will receive a reminder letter in the mail one-two months in advance.  If you don't receive a letter, please call our office to schedule the follow up appointment   Any Other Special Instructions Will Be Listed Below (If Applicable).  If you need a refill on your cardiac medications before your next appointment, please call your pharmacy.

## 2016-07-14 NOTE — Progress Notes (Signed)
Patient ID: Jimmy Knox, male   DOB: [birth year], 82 y.o.   MRN: [id number]      SUBJECTIVE: The patient presents for follow-up of coronary artery disease. Nuclear stress test on 10/17/15 showed a large area of myocardial scar primarily in the inferior, inferoseptal, and inferolateral walls extending from the apex to the base. There was a mild degree of peri-infarct ischemia, calculated LVEF 42%. It was an intermediate risk study. he has a h/o CAD with a prior MI and stent around 2000, carotid artery disease (less than 50% stenosis bilaterally on 06/14/15), ischemic cardiomyopathy, essential HTN, and hyperlipidemia.  His most recent echocardiogram on 04/12/15 revealed an EF of 40% with grade 1 diastolic dysfunction.  He denies chest pain and shortness of breath. He has had left sided ankle swelling in the past month. He plans to undergo right hip surgery in the near future.  Carotid Dopplers 06/20/16 less than 50% internal carotid artery stenosis bilaterally.  SocHx: Married for 57 years this past June 2017. Has 2 children and 3 granddaughters who live fairly close to them.  Review of Systems: As per "subjective", otherwise negative.  No Known Allergies  Current Outpatient Prescriptions  Medication Sig Dispense Refill  . amitriptyline (ELAVIL) 50 MG tablet Take 50 mg by mouth at bedtime.    Marland Kitchen aspirin 81 MG chewable tablet Chew 81 mg by mouth daily.    Marland Kitchen losartan (COZAAR) 25 MG tablet Take 1 tablet (25 mg total) by mouth daily. 90 tablet 3  . metoprolol succinate (TOPROL-XL) 25 MG 24 hr tablet Take 25 mg by mouth daily.    . simvastatin (ZOCOR) 40 MG tablet Take 20 mg by mouth every evening.      No current facility-administered medications for this visit.    Past Medical History  Diagnosis Date  . Arteriosclerotic cardiovascular disease (ASCVD)     Stent in approximately 2000  . Hypertension   . Hyperlipidemia   . Fasting hyperglycemia     diet-controlled  . Diabetes mellitus  without complication ([place])   . Coronary artery disease   . Myocardial infarction [facility])     Past Surgical History  Procedure Laterality Date  . Carotid stent    . Back surgery    . Colonoscopy N/A 09/28/2015    Procedure: COLONOSCOPY;  Surgeon: Rogene Houston, MD;  Location[facility];  Service: Endoscopy;  Laterality: N/A;  955    Social History   Social History  . Marital Status: Married    Spouse Name: N/A  . Number of Children: N/A  . Years of Education: N/A   Occupational History  . Retired    Social History Main Topics  . Smoking status: Former Smoker -- 0.50 packs/day for 20 years    Types: Cigarettes    Start date: 12/30/1943    Quit date: 12/29/1968  . Smokeless tobacco: Never Used  . Alcohol Use: No  . Drug Use: No  . Sexual Activity: Not on file   Other Topics Concern  . Not on file   Social History Narrative     Filed Vitals:   07/14/16 1131  BP: 118/82  Pulse: 70  Height: 6' (1.829 m)  Weight: 214 lb (97.07 kg)  SpO2: 97%    PHYSICAL EXAM General: NAD HEENT: Normal. Neck: No JVD, no thyromegaly. Lungs: Clear to auscultation bilaterally with normal respiratory effort. CV: Nondisplaced PMI.  Regular rate and rhythm, normal S1/S2, no S3/S4, no murmur. No pretibial, trace to 1+ periankle  edema.     Abdomen: Soft, nontender, no distention.  Neurologic: Alert and oriented.  Psych: Normal affect. Skin: Normal. Musculoskeletal: No gross deformities.    ECG: Most recent ECG reviewed.      ASSESSMENT AND PLAN: 1. Exertional dyspnea and fatigue in context of CAD, prior MI with ischemic cardiomyopathy, EF 40%: Stable NYHA class II symptoms. Lexiscan Cardiolite stress test results noted above with primarily myocardial scar seen, mild peri-infarct ischemia. Will continue current therapy.   2. Essential HTN: Controlled. No changes.  3. Hyperlipidemia: On simvastatin. No changes.  4. Carotid artery disease: Carotid Dopplers 06/20/16 less  than 50% internal carotid artery stenosis bilaterally. Would repeat in 3 years.  5. Ankle swelling/edema: Will start Lasix 20 mg daily.  6. Preoperative risk stratification: He can proceed with right total hip replacement surgery with an acceptable level of risk. Continue present medical therapy.  Dispo: f/u 6 months.   Kate Sable, M.D., F.A.C.C.

## 2016-07-21 ENCOUNTER — Ambulatory Visit (HOSPITAL_COMMUNITY)
Admission: RE | Admit: 2016-07-21 | Discharge: 2016-07-21 | Disposition: A | Discharge status: Home / Self Care | Payer: Medicare Other | Source: Ambulatory Visit | Attending: Orthopedic Surgery | Admitting: Orthopedic Surgery

## 2016-07-21 ENCOUNTER — Encounter (HOSPITAL_COMMUNITY): Payer: Self-pay

## 2016-07-21 ENCOUNTER — Encounter (HOSPITAL_COMMUNITY)
Admission: RE | Admit: 2016-07-21 | Discharge: 2016-07-21 | Disposition: A | Discharge status: Home / Self Care | Payer: Medicare Other | Source: Ambulatory Visit | Attending: Orthopaedic Surgery | Admitting: Orthopaedic Surgery

## 2016-07-21 DIAGNOSIS — Z01818 Encounter for other preprocedural examination: Secondary | ICD-10-CM | POA: Insufficient documentation

## 2016-07-21 DIAGNOSIS — J984 Other disorders of lung: Secondary | ICD-10-CM | POA: Insufficient documentation

## 2016-07-21 HISTORY — DX: Unspecified osteoarthritis, unspecified site: M19.90

## 2016-07-21 HISTORY — DX: Pneumonia, unspecified organism: J18.9

## 2016-07-21 LAB — COMPREHENSIVE METABOLIC PANEL
ALT: 23 U/L (ref 17–63)
ANION GAP: 8 (ref 5–15)
AST: 27 U/L (ref 15–41)
Albumin: 4.2 g/dL (ref 3.5–5.0)
Alkaline Phosphatase: 104 U/L (ref 38–126)
BUN: 26 mg/dL — ABNORMAL HIGH (ref 6–20)
CHLORIDE: 104 mmol/L (ref 101–111)
CO2: 26 mmol/L (ref 22–32)
CREATININE: 1.23 mg/dL (ref 0.61–1.24)
Calcium: 9.4 mg/dL (ref 8.9–10.3)
GFR, EST NON AFRICAN AMERICAN: 53 mL/min — AB (ref >60)
Glucose, Bld: 117 mg/dL — ABNORMAL HIGH (ref 65–99)
POTASSIUM: 4.2 mmol/L (ref 3.5–5.1)
SODIUM: 138 mmol/L (ref 135–145)
Total Bilirubin: 1 mg/dL (ref 0.3–1.2)
Total Protein: 7.4 g/dL (ref 6.5–8.1)

## 2016-07-21 LAB — URINALYSIS, ROUTINE W REFLEX MICROSCOPIC
Bilirubin Urine: NEGATIVE
GLUCOSE, UA: NEGATIVE mg/dL
HGB URINE DIPSTICK: NEGATIVE
KETONES UR: NEGATIVE mg/dL
LEUKOCYTES UA: NEGATIVE
Nitrite: NEGATIVE
PH: 6 (ref 5.0–8.0)
PROTEIN: NEGATIVE mg/dL
Specific Gravity, Urine: 1.011 (ref 1.005–1.030)

## 2016-07-21 LAB — CBC WITH DIFFERENTIAL/PLATELET
BASOS ABS: 0.1 10*3/uL (ref 0.0–0.1)
Basophils Relative: 1 %
EOS PCT: 2 %
Eosinophils Absolute: 0.2 10*3/uL (ref 0.0–0.7)
HCT: 42.3 % (ref 39.0–52.0)
HEMOGLOBIN: 14.6 g/dL (ref 13.0–17.0)
Lymphocytes Relative: 39 %
Lymphs Abs: 4.8 10*3/uL — ABNORMAL HIGH (ref 0.7–4.0)
MCH: 31.7 pg (ref 26.0–34.0)
MCHC: 34.5 g/dL (ref 30.0–36.0)
MCV: 92 fL (ref 78.0–100.0)
MONO ABS: 1.1 10*3/uL — AB (ref 0.1–1.0)
Monocytes Relative: 9 %
NEUTROS PCT: 49 %
Neutro Abs: 6.1 10*3/uL (ref 1.7–7.7)
PLATELETS: 211 10*3/uL (ref 150–400)
RBC: 4.6 MIL/uL (ref 4.22–5.81)
RDW: 13.3 % (ref 11.5–15.5)
WBC: 12.3 10*3/uL — AB (ref 4.0–10.5)

## 2016-07-21 LAB — APTT: aPTT: 27 seconds (ref 24–37)

## 2016-07-21 LAB — PROTIME-INR
INR: 1.03 (ref 0.00–1.49)
PROTHROMBIN TIME: 13.7 s (ref 11.6–15.2)

## 2016-07-21 LAB — ABO/RH: ABO/RH(D): O POS

## 2016-07-21 LAB — SURGICAL PCR SCREEN
MRSA, PCR: NEGATIVE
Staphylococcus aureus: NEGATIVE

## 2016-07-21 LAB — GLUCOSE, CAPILLARY: Glucose-Capillary: 114 mg/dL — ABNORMAL HIGH (ref 65–99)

## 2016-07-21 NOTE — Progress Notes (Signed)
   07/21/16 1321  OBSTRUCTIVE SLEEP APNEA  Have you ever been diagnosed with sleep apnea through a sleep study? No  Do you snore loudly (loud enough to be heard through closed doors)?  1  Do you often feel tired, fatigued, or sleepy during the daytime (such as falling asleep during driving or talking to someone)? 0  Has anyone observed you stop breathing during your sleep? 1  Do you have, or are you being treated for high blood pressure? 1  BMI more than 35 kg/m2? 0  Age > 50 (1-yes) 1  Neck circumference greater than:Male 16 inches or larger, Male 17inches or larger? 1 (55.5)  Male Gender (Yes=1) 1  Obstructive Sleep Apnea Score 6  Score 5 or greater  Results sent to PCP

## 2016-07-21 NOTE — Pre-Procedure Instructions (Addendum)
[facility]  07/21/2016      [facility] [address], [place] - [id number] [place] [address] Phone: [phone] Fax: [phone]  [place] [address], [place] [address] Phone: [phone] Fax: [phone]  [facility] [place], [place] [address] [place] [address] Phone: [phone] Fax: [phone]  [facility] [place], [place] - [address] AT [place] [place] [address] Phone: [phone] Fax: [phone]    Your procedure is scheduled on 07/29/16  Report to [facility] Admitting at 1000 A.M.  Call this number if you have problems the morning of surgery:  [phone]   Remember:  Do not eat food or drink liquids after midnight.  Take these medicines the morning of surgery with A SIP OF WATER metoprolol  STOP all herbel meds, nsaids (aleve,naproxen,advil,ibuprofen) 5 days prior to surgery including all vitamins,aspirin   Do not wear jewelry, make-up or nail polish.  Do not wear lotions, powders, or perfumes.  You may wear deoderant.  Do not shave 48 hours prior to surgery.  Men may shave face and neck.  Do not bring valuables to the hospital.  [facility] is not responsible for any belongings or valuables.  Contacts, dentures or bridgework may not be worn into surgery.  Leave your suitcase in the car.  After surgery it may be brought to your room.  For patients admitted to the hospital, discharge time will be determined by your treatment team.  Patients discharged the day of surgery will not be allowed to drive home.   Name and phone number of your driver:   Special instructions:  Special Instructions: [name] - Preparing for Surgery  Before surgery, you can play an important role.  Because skin is not  sterile, your skin needs to be as free of germs as possible.  You can reduce the number of germs on you skin by washing with CHG (chlorahexidine gluconate) soap before surgery.  CHG is an antiseptic cleaner which kills germs and bonds with the skin to continue killing germs even after washing.  Please DO NOT use if you have an allergy to CHG or antibacterial soaps.  If your skin becomes reddened/irritated stop using the CHG and inform your nurse when you arrive at Short Stay.  Do not shave (including legs and underarms) for at least 48 hours prior to the first CHG shower.  You may shave your face.  Please follow these instructions carefully:   1.  Shower with CHG Soap the night before surgery and the morning of Surgery.  2.  If you choose to wash your hair, wash your hair first as usual with your normal shampoo.  3.  After you shampoo, rinse your hair and body thoroughly to remove the Shampoo.  4.  Use CHG as you would any other liquid soap.  You can apply chg directly  to the skin and wash gently with scrungie or a clean washcloth.  5.  Apply the CHG Soap to your body ONLY FROM THE NECK DOWN.  Do not use on open wounds or open sores.  Avoid contact with your eyes ears, mouth and genitals (private parts).  Wash genitals (private parts)  with your normal soap.  6.  Wash thoroughly, paying special attention to the area where your surgery will be performed.  7.  Thoroughly rinse your body with warm water from the neck down.  8.  DO NOT shower/wash with your normal soap after using and rinsing off the CHG Soap.  9.  Pat yourself dry with a clean towel.            10.  Wear clean pajamas.            11.  Place clean sheets on your bed the night of your first shower and do not sleep with pets.  Day of Surgery  Do not apply any lotions/deodorants the morning of surgery.  Please wear clean clothes to the hospital/surgery center.  Please read over the following fact sheets that you were  given. Pain Booklet and MRSA Information

## 2016-07-22 LAB — URINE CULTURE
Culture: <10000 — AB
SPECIAL REQUESTS: NORMAL

## 2016-07-22 LAB — HEMOGLOBIN A1C
HEMOGLOBIN A1C: 6.1 % — AB (ref 4.8–5.6)
Mean Plasma Glucose: 128 mg/dL

## 2016-07-23 DIAGNOSIS — M1611 Unilateral primary osteoarthritis, right hip: Secondary | ICD-10-CM | POA: Diagnosis not present

## 2016-07-23 NOTE — Progress Notes (Signed)
Anesthesia chart review: Patient is a 82 year old male scheduled for right THR on 07/29/2016 by Dr. Durward Fortes.  History includes former smoker, hypertension, hyperlipidemia, CAD/MI s/p stent '00, ischemic CM, DM2 (diet controlled), arthritis, skin cancer, carotid artery disease.   PCP is listed as Dr. Asencion Noble. Cardiologist is Dr. [name], last visit 07/14/16. He wrote, "Preoperative risk stratification: He can proceed with right total hip replacement surgery with an acceptable level of risk. Continue present medical therapy."   Meds include Elavil, aspirin 81 mg, Lasix, losartan, Toprol-XL, Zocor.  06/13/16 EKG: SR, inferior infarct (probably old).  10/17/15 Nuclear stress test:  There was no ST segment deviation noted during stress.  Defect 1: There is a large defect of severe severity present in the basal inferoseptal, basal inferior, basal inferolateral, mid inferoseptal, mid inferior, mid inferolateral, apical anterior and apical inferior location.  Findings consistent with prior myocardial infarction with mild peri-infarct ischemia in the basal inferior wall. Primarily, there is myocardial scar.  This is an intermediate risk study.  Nuclear stress EF: 42%. Dr. [name] reviewed and recommended continue current therapy.  04/12/15 Echo: Impressions: - Mild LVH with LVEF approximately 40% (biplane volume) and wall motion abnormalties consistent with ischemic cardiomyopathy. Grade 1 diastolic dysfunction. Mild biatrial enlargement. Sclerotic aortic valve with trivial aortic regurgitation. Unable to assess PASP.  06/20/16 Carotid U/S: IMPRESSION: 1. Mild right carotid bifurcation atherosclerotic vascular disease again noted. Similar findings noted on prior exam. No flow limiting stenosis. Degree of stenosis less 50%. 2. Left carotid bifurcation moderate atherosclerotic vascular disease again noted. Again noted is a component of plaque that extends across the lumen  suggesting possible chronic dissection and/or ulceration. This is unchanged. No flow limiting stenosis. Degree of stenosis less than 50%. 3. Vertebral arteries are patent antegrade flow. Dr. [name] recommended repeat in 3 years.  07/04/13 PFTs: FVC 3.56 (77%), FEV1 2.94 (89%), DLCOunc 16.27 (44%).   07/21/16 CXR: IMPRESSION: Chronic pulmonary scarring changes without definite acute overlying pulmonary process.  Preoperative labs noted. A1c 6.1. Urine culture showed insignificant growth.  Recent cardiology evaluation with clearance. If no acute changes then I anticipate that he can proceed as planned.  George [facility]/Anesthesiology Phone [phone] 07/23/2016 7:38 PM

## 2016-07-28 MED ORDER — TRANEXAMIC ACID 1000 MG/10ML IV SOLN
2000.0000 mg | INTRAVENOUS | Status: DC
  Filled 2016-07-28: qty 20

## 2016-07-28 NOTE — H&P (Signed)
CHIEF COMPLAINT:  Painful right hip.   HISTORY OF PRESENT ILLNESS:  Jimmy Knox is a very pleasant, 82 year old, white male who is seen today for evaluation of his right hip.  He has had problems in the past with his right hip, causing him pain and discomfort.  He has had some pain in the groin area, but has also noted decreased motion of the right hip secondary to pain.  He does not remember any history of injury or trauma. He does have some symptoms of pain along the groin, anterior thigh, and into the lateral aspect of the trochanteric region.  He has gotten to the point now where as he is walking, he has an antalgic limp.  He is now having pain with every step.  He has had a corticosteroid injection, which had some benefit, at least temporarily.  He has problems with activities of daily living secondary to pain in the groin.  He has had a myocardial infarction in the past and had stenting in 2000.  Continues to the point where he is seen today for evaluation.   PAST SURGICAL HISTORY:  In general, his health is fair.   HOSPITALIZATIONS:  In 1992 for lumbar surgery; 2000 for cardiac stenting; 1998 for cataracts and globe repair on the right eye; 1990s for kidney stones on 3 separate occasions; 1972 for pneumonia; 1943 when he was 82 years old he had [facility] spotted fever.   MEDICATIONS:  Metoprolol 25 mg daily, simvastatin 40 mg daily, losartan 25 mg daily, amitriptyline 50 mg at bedtime, aspirin 81 mg daily.   ALLERGIES:  NONE KNOWN.   REVIEW OF SYSTEMS:  A 14-point review of systems is unremarkable except for history of cataracts and decreased hearing.  He does have dentures, upper and lower.  He does occasionally get dyspnea on exertion, especially when he is walking a lot of stairs.  He did have a myocardial infarction in 2000 and had stenting.  He has had hypertension for 10 years now.  Diabetes for 10 years also, but it is diet controlled.  Does have a history of melanoma of the skin  with excision.  Kidney stones as previously noted.   FAMILY HISTORY:  Reveals a mother who died at age 56 from Alzheimer's.  She had hypertension and arthritis.  Father died at 40 from cancer of the bladder.  He has 1 brother who is age 9 and in good health, and 1 who died in the 31s from a diabetic coma.  No sisters.   SOCIAL HISTORY:  Jimmy Knox is an 82 year old, white, married male.  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  He quit smoking cigarettes in 1972.  Prior to that he had a 20-year history of a 1/2-pack per day.   PHYSICAL EXAMINATION:   General, an 82 year old, white male.  Well developed and well nourished.  Alert and cooperative, in moderate distress secondary to right hip pain.  Height 71.5 inches.  Weight 214 pounds.  BMI 29.4.  Vital signs reveal temperature of 98.6.  Pulse 72.  Respirations 12.  Blood pressure 124/70.  Head is normocephalic.  Eyes: Pupils equal, round and reactive to light and accommodation, except for the right eye, which appears to have an enlarged and not reactive pupil. Neck:  Supple.  No carotid bruits. Chest had good expansion.  Was essentially clear with occasional wheeze. Cardiac had a regular rhythm and rate with markedly distant heart sounds.  I could not discern a murmur at this  time.  Pulses were 1+ bilaterally and symmetric in the lower extremity. CNS:  Oriented x3 and cranial nerves II-XII grossly intact. Abdomen was scaphoid soft, nontender.  No masses palpable and normal bowel sounds present. Musculoskeletal: He has decreased internal and external rotation of the right hip.  This is also very painful for him when I do this.  He has referred anterior thigh pain with that motion.  Negative straight leg raising.    RADIOGRAPHS:  Radiographic studies show marked joint space narrowing of the right hip.  He does have some cysts at the subcapital area, more at the lateral aspect of the femoral head.  Joint space narrowing more in the  superior dome.   CLINICAL IMPRESSION:   1.  OA, right hip. 2.  History of myocardial infarction with stenting. 3.  History of hypertension. 4.  Diet-controlled diabetes. 5.  Skin melanoma. 6.  Renal calculi. 7.  Depression.   RECOMMENDATIONS:   1.  At this time I have reviewed a note from Dr. Willey Blade who felt that Jimmy Knox would be a candidate for a right total hip arthroplasty from a medical standpoint.  Cardiac from Dr. Jacinta Shoe revealed that he felt that from a cardiac standpoint that it was safe for him to proceed with a total hip replacement.  Therefore, the procedure, risks and benefits were fully explained to him in detail.  All questions were answered.  I showed him the prosthesis that would be used, the construct and the procedure itself.  All questions were answered.  Mike Craze [place], [place] [phone]  07/28/2016 4:50 PM

## 2016-07-29 ENCOUNTER — Inpatient Hospital Stay (HOSPITAL_COMMUNITY): Payer: Medicare Other | Admitting: Vascular Surgery

## 2016-07-29 ENCOUNTER — Inpatient Hospital Stay (HOSPITAL_COMMUNITY): Payer: Medicare Other

## 2016-07-29 ENCOUNTER — Encounter (HOSPITAL_COMMUNITY): Payer: Self-pay | Admitting: Anesthesiology

## 2016-07-29 ENCOUNTER — Encounter (HOSPITAL_COMMUNITY)
Admission: RE | Disposition: A | Discharge status: Home / Self Care | Payer: Self-pay | Source: Ambulatory Visit | Attending: Orthopaedic Surgery

## 2016-07-29 ENCOUNTER — Inpatient Hospital Stay (HOSPITAL_COMMUNITY): Payer: Medicare Other | Admitting: Certified Registered"

## 2016-07-29 ENCOUNTER — Inpatient Hospital Stay (HOSPITAL_COMMUNITY)
Admission: RE | Admit: 2016-07-29 | Discharge: 2016-08-01 | DRG: 470 | Disposition: A | Discharge status: Home / Self Care | Payer: Medicare Other | Source: Ambulatory Visit | Attending: Orthopaedic Surgery | Admitting: Orthopaedic Surgery

## 2016-07-29 DIAGNOSIS — Z96641 Presence of right artificial hip joint: Secondary | ICD-10-CM | POA: Diagnosis not present

## 2016-07-29 DIAGNOSIS — I2510 Atherosclerotic heart disease of native coronary artery without angina pectoris: Secondary | ICD-10-CM | POA: Diagnosis present

## 2016-07-29 DIAGNOSIS — I9589 Other hypotension: Secondary | ICD-10-CM | POA: Diagnosis not present

## 2016-07-29 DIAGNOSIS — Z9889 Other specified postprocedural states: Secondary | ICD-10-CM

## 2016-07-29 DIAGNOSIS — M1611 Unilateral primary osteoarthritis, right hip: Principal | ICD-10-CM

## 2016-07-29 DIAGNOSIS — D62 Acute posthemorrhagic anemia: Secondary | ICD-10-CM | POA: Diagnosis not present

## 2016-07-29 DIAGNOSIS — I9581 Postprocedural hypotension: Secondary | ICD-10-CM | POA: Diagnosis not present

## 2016-07-29 DIAGNOSIS — E871 Hypo-osmolality and hyponatremia: Secondary | ICD-10-CM | POA: Diagnosis present

## 2016-07-29 DIAGNOSIS — I5042 Chronic combined systolic (congestive) and diastolic (congestive) heart failure: Secondary | ICD-10-CM | POA: Diagnosis present

## 2016-07-29 DIAGNOSIS — I5043 Acute on chronic combined systolic (congestive) and diastolic (congestive) heart failure: Secondary | ICD-10-CM

## 2016-07-29 DIAGNOSIS — E785 Hyperlipidemia, unspecified: Secondary | ICD-10-CM | POA: Diagnosis present

## 2016-07-29 DIAGNOSIS — E1151 Type 2 diabetes mellitus with diabetic peripheral angiopathy without gangrene: Secondary | ICD-10-CM | POA: Diagnosis present

## 2016-07-29 DIAGNOSIS — C439 Malignant melanoma of skin, unspecified: Secondary | ICD-10-CM | POA: Diagnosis present

## 2016-07-29 DIAGNOSIS — Z8249 Family history of ischemic heart disease and other diseases of the circulatory system: Secondary | ICD-10-CM | POA: Diagnosis not present

## 2016-07-29 DIAGNOSIS — F329 Major depressive disorder, single episode, unspecified: Secondary | ICD-10-CM | POA: Diagnosis present

## 2016-07-29 DIAGNOSIS — Z96649 Presence of unspecified artificial hip joint: Secondary | ICD-10-CM

## 2016-07-29 DIAGNOSIS — M169 Osteoarthritis of hip, unspecified: Secondary | ICD-10-CM | POA: Diagnosis not present

## 2016-07-29 DIAGNOSIS — M25551 Pain in right hip: Secondary | ICD-10-CM | POA: Diagnosis not present

## 2016-07-29 DIAGNOSIS — Z7982 Long term (current) use of aspirin: Secondary | ICD-10-CM | POA: Diagnosis not present

## 2016-07-29 DIAGNOSIS — I110 Hypertensive heart disease with heart failure: Secondary | ICD-10-CM | POA: Diagnosis present

## 2016-07-29 DIAGNOSIS — Z87891 Personal history of nicotine dependence: Secondary | ICD-10-CM

## 2016-07-29 DIAGNOSIS — I959 Hypotension, unspecified: Secondary | ICD-10-CM

## 2016-07-29 DIAGNOSIS — I252 Old myocardial infarction: Secondary | ICD-10-CM | POA: Diagnosis not present

## 2016-07-29 DIAGNOSIS — I472 Ventricular tachycardia: Secondary | ICD-10-CM | POA: Diagnosis not present

## 2016-07-29 DIAGNOSIS — I10 Essential (primary) hypertension: Secondary | ICD-10-CM | POA: Diagnosis not present

## 2016-07-29 DIAGNOSIS — Z79899 Other long term (current) drug therapy: Secondary | ICD-10-CM

## 2016-07-29 DIAGNOSIS — Z955 Presence of coronary angioplasty implant and graft: Secondary | ICD-10-CM | POA: Diagnosis not present

## 2016-07-29 DIAGNOSIS — I255 Ischemic cardiomyopathy: Secondary | ICD-10-CM | POA: Diagnosis present

## 2016-07-29 DIAGNOSIS — Z471 Aftercare following joint replacement surgery: Secondary | ICD-10-CM | POA: Diagnosis not present

## 2016-07-29 HISTORY — DX: Spotted fever due to Rickettsia rickettsii: A77.0

## 2016-07-29 HISTORY — DX: Chronic combined systolic (congestive) and diastolic (congestive) heart failure: I50.42

## 2016-07-29 HISTORY — DX: Ischemic cardiomyopathy: I25.5

## 2016-07-29 HISTORY — DX: Type 2 diabetes mellitus without complications: E11.9

## 2016-07-29 HISTORY — DX: Adverse effect of unspecified anesthetic, initial encounter: T41.45XA

## 2016-07-29 HISTORY — DX: Personal history of other medical treatment: Z92.89

## 2016-07-29 HISTORY — DX: Unspecified malignant neoplasm of skin, unspecified: C44.90

## 2016-07-29 HISTORY — PX: TOTAL HIP ARTHROPLASTY: SHX124

## 2016-07-29 HISTORY — DX: Malignant melanoma of nose: C43.31

## 2016-07-29 HISTORY — DX: Disorder of arteries and arterioles, unspecified: I77.9

## 2016-07-29 HISTORY — DX: Peripheral vascular disease, unspecified: I73.9

## 2016-07-29 HISTORY — DX: Other complications of anesthesia, initial encounter: T88.59XA

## 2016-07-29 LAB — CBC
HCT: 34.3 % — ABNORMAL LOW (ref 39.0–52.0)
Hemoglobin: 11.3 g/dL — ABNORMAL LOW (ref 13.0–17.0)
MCH: 30.6 pg (ref 26.0–34.0)
MCHC: 32.9 g/dL (ref 30.0–36.0)
MCV: 93 fL (ref 78.0–100.0)
PLATELETS: 198 10*3/uL (ref 150–400)
RBC: 3.69 MIL/uL — AB (ref 4.22–5.81)
RDW: 12.9 % (ref 11.5–15.5)
WBC: 18.2 10*3/uL — ABNORMAL HIGH (ref 4.0–10.5)

## 2016-07-29 LAB — BASIC METABOLIC PANEL
ANION GAP: 6 (ref 5–15)
BUN: 19 mg/dL (ref 6–20)
CHLORIDE: 105 mmol/L (ref 101–111)
CO2: 25 mmol/L (ref 22–32)
Calcium: 8.6 mg/dL — ABNORMAL LOW (ref 8.9–10.3)
Creatinine, Ser: 1.13 mg/dL (ref 0.61–1.24)
GFR calc Af Amer: >60 mL/min (ref >60)
GFR, EST NON AFRICAN AMERICAN: 59 mL/min — AB (ref >60)
Glucose, Bld: 150 mg/dL — ABNORMAL HIGH (ref 65–99)
POTASSIUM: 4.5 mmol/L (ref 3.5–5.1)
SODIUM: 136 mmol/L (ref 135–145)

## 2016-07-29 LAB — TROPONIN I

## 2016-07-29 LAB — PREPARE RBC (CROSSMATCH)

## 2016-07-29 LAB — GLUCOSE, CAPILLARY: GLUCOSE-CAPILLARY: 129 mg/dL — AB (ref 65–99)

## 2016-07-29 SURGERY — ARTHROPLASTY, HIP, TOTAL,POSTERIOR APPROACH
Anesthesia: General | Site: Hip | Laterality: Right

## 2016-07-29 MED ORDER — ONDANSETRON HCL 4 MG/2ML IJ SOLN
INTRAMUSCULAR | Status: DC | PRN
  Administered 2016-07-29: 4 mg via INTRAVENOUS

## 2016-07-29 MED ORDER — PHENOL 1.4 % MT LIQD: 1.0000 | OROMUCOSAL | Status: DC | PRN

## 2016-07-29 MED ORDER — BISACODYL 10 MG RE SUPP: 10.0000 mg | Freq: Every day | RECTAL | Status: DC | PRN

## 2016-07-29 MED ORDER — PHENYLEPHRINE HCL 10 MG/ML IJ SOLN
INTRAMUSCULAR | Status: AC
  Filled 2016-07-29: qty 1

## 2016-07-29 MED ORDER — METOCLOPRAMIDE HCL 5 MG/ML IJ SOLN: 5.0000 mg | Freq: Three times a day (TID) | INTRAMUSCULAR | Status: DC | PRN

## 2016-07-29 MED ORDER — FENTANYL CITRATE (PF) 100 MCG/2ML IJ SOLN
25.0000 ug | INTRAMUSCULAR | Status: DC | PRN
  Administered 2016-07-29: 25 ug via INTRAVENOUS

## 2016-07-29 MED ORDER — PHENYLEPHRINE HCL 10 MG/ML IJ SOLN
INTRAMUSCULAR | Status: DC | PRN
  Administered 2016-07-29 (×2): 80 ug via INTRAVENOUS
  Administered 2016-07-29: 160 ug via INTRAVENOUS
  Administered 2016-07-29 (×3): 80 ug via INTRAVENOUS
  Administered 2016-07-29: 200 ug via INTRAVENOUS
  Administered 2016-07-29: 80 ug via INTRAVENOUS

## 2016-07-29 MED ORDER — BUPIVACAINE-EPINEPHRINE (PF) 0.25% -1:200000 IJ SOLN
INTRAMUSCULAR | Status: DC | PRN
  Administered 2016-07-29: 30 mL via PERINEURAL

## 2016-07-29 MED ORDER — DOCUSATE SODIUM 100 MG PO CAPS
100.0000 mg | ORAL_CAPSULE | Freq: Two times a day (BID) | ORAL | Status: DC
  Administered 2016-07-29 – 2016-08-01 (×6): 100 mg via ORAL
  Filled 2016-07-29 (×6): qty 1

## 2016-07-29 MED ORDER — METHOCARBAMOL 500 MG PO TABS
500.0000 mg | ORAL_TABLET | Freq: Four times a day (QID) | ORAL | Status: DC | PRN
  Administered 2016-07-29 – 2016-08-01 (×6): 500 mg via ORAL
  Filled 2016-07-29 (×7): qty 1

## 2016-07-29 MED ORDER — CEFAZOLIN SODIUM-DEXTROSE 2-4 GM/100ML-% IV SOLN
INTRAVENOUS | Status: AC
  Filled 2016-07-29: qty 100

## 2016-07-29 MED ORDER — METHOCARBAMOL 500 MG PO TABS
ORAL_TABLET | ORAL | Status: AC
  Filled 2016-07-29: qty 1

## 2016-07-29 MED ORDER — RIVAROXABAN 10 MG PO TABS
10.0000 mg | ORAL_TABLET | Freq: Every day | ORAL | Status: DC
Start: 2016-07-30 — End: 2016-08-01
  Administered 2016-07-30 – 2016-08-01 (×3): 10 mg via ORAL
  Filled 2016-07-29 (×3): qty 1

## 2016-07-29 MED ORDER — ROCURONIUM BROMIDE 100 MG/10ML IV SOLN
INTRAVENOUS | Status: DC | PRN
  Administered 2016-07-29: 50 mg via INTRAVENOUS
  Administered 2016-07-29: 20 mg via INTRAVENOUS
  Administered 2016-07-29: 10 mg via INTRAVENOUS

## 2016-07-29 MED ORDER — DEXTROSE 5 % IV SOLN
500.0000 mg | Freq: Four times a day (QID) | INTRAVENOUS | Status: DC | PRN
  Filled 2016-07-29: qty 5

## 2016-07-29 MED ORDER — SODIUM CHLORIDE 0.9 % IV SOLN: 10.0000 mL/h | Freq: Once | INTRAVENOUS | Status: DC

## 2016-07-29 MED ORDER — SIMVASTATIN 20 MG PO TABS
20.0000 mg | ORAL_TABLET | Freq: Every evening | ORAL | Status: DC
  Administered 2016-07-29 – 2016-07-31 (×3): 20 mg via ORAL
  Filled 2016-07-29 (×3): qty 1

## 2016-07-29 MED ORDER — ALUM & MAG HYDROXIDE-SIMETH 200-200-20 MG/5ML PO SUSP: 30.0000 mL | ORAL | Status: DC | PRN

## 2016-07-29 MED ORDER — PROPOFOL 10 MG/ML IV BOLUS
INTRAVENOUS | Status: AC
  Filled 2016-07-29: qty 20

## 2016-07-29 MED ORDER — ACETAMINOPHEN 10 MG/ML IV SOLN
1000.0000 mg | Freq: Four times a day (QID) | INTRAVENOUS | Status: DC
  Administered 2016-07-29 – 2016-07-30 (×2): 1000 mg via INTRAVENOUS
  Filled 2016-07-29 (×3): qty 100

## 2016-07-29 MED ORDER — ONDANSETRON HCL 4 MG PO TABS: 4.0000 mg | ORAL_TABLET | Freq: Four times a day (QID) | ORAL | Status: DC | PRN

## 2016-07-29 MED ORDER — FENTANYL CITRATE (PF) 100 MCG/2ML IJ SOLN
INTRAMUSCULAR | Status: AC
  Filled 2016-07-29: qty 2

## 2016-07-29 MED ORDER — LIDOCAINE HCL (CARDIAC) 20 MG/ML IV SOLN
INTRAVENOUS | Status: DC | PRN
  Administered 2016-07-29: 60 mg via INTRAVENOUS

## 2016-07-29 MED ORDER — CEFAZOLIN SODIUM-DEXTROSE 2-4 GM/100ML-% IV SOLN
2.0000 g | INTRAVENOUS | Status: AC
  Administered 2016-07-29: 2 g via INTRAVENOUS

## 2016-07-29 MED ORDER — PHENYLEPHRINE HCL 10 MG/ML IJ SOLN
INTRAVENOUS | Status: DC | PRN
  Administered 2016-07-29: 100 ug/min via INTRAVENOUS

## 2016-07-29 MED ORDER — METOCLOPRAMIDE HCL 5 MG PO TABS: 5.0000 mg | ORAL_TABLET | Freq: Three times a day (TID) | ORAL | Status: DC | PRN

## 2016-07-29 MED ORDER — FENTANYL CITRATE (PF) 250 MCG/5ML IJ SOLN
INTRAMUSCULAR | Status: AC
  Filled 2016-07-29: qty 5

## 2016-07-29 MED ORDER — SUGAMMADEX SODIUM 200 MG/2ML IV SOLN
INTRAVENOUS | Status: AC
  Filled 2016-07-29: qty 2

## 2016-07-29 MED ORDER — CHLORHEXIDINE GLUCONATE 4 % EX LIQD: 60.0000 mL | Freq: Once | CUTANEOUS | Status: DC

## 2016-07-29 MED ORDER — HYDROCODONE-ACETAMINOPHEN 5-325 MG PO TABS
1.0000 | ORAL_TABLET | ORAL | Status: DC | PRN
  Administered 2016-07-30: 2 via ORAL
  Administered 2016-07-30 (×2): 1 via ORAL
  Administered 2016-07-31 – 2016-08-01 (×4): 2 via ORAL
  Administered 2016-08-01: 1 via ORAL
  Filled 2016-07-29: qty 1
  Filled 2016-07-29: qty 2
  Filled 2016-07-29 (×2): qty 1
  Filled 2016-07-29 (×5): qty 2

## 2016-07-29 MED ORDER — AMITRIPTYLINE HCL 50 MG PO TABS
50.0000 mg | ORAL_TABLET | Freq: Every day | ORAL | Status: DC
  Administered 2016-07-29 – 2016-07-31 (×3): 50 mg via ORAL
  Filled 2016-07-29 (×3): qty 1

## 2016-07-29 MED ORDER — 0.9 % SODIUM CHLORIDE (POUR BTL) OPTIME
TOPICAL | Status: DC | PRN
  Administered 2016-07-29: 1000 mL

## 2016-07-29 MED ORDER — ONDANSETRON HCL 4 MG/2ML IJ SOLN: 4.0000 mg | Freq: Four times a day (QID) | INTRAMUSCULAR | Status: DC | PRN

## 2016-07-29 MED ORDER — LIDOCAINE 2% (20 MG/ML) 5 ML SYRINGE
INTRAMUSCULAR | Status: AC
  Filled 2016-07-29: qty 5

## 2016-07-29 MED ORDER — SUGAMMADEX SODIUM 200 MG/2ML IV SOLN
INTRAVENOUS | Status: DC | PRN
  Administered 2016-07-29: 200 mg via INTRAVENOUS

## 2016-07-29 MED ORDER — KETOROLAC TROMETHAMINE 15 MG/ML IJ SOLN
INTRAMUSCULAR | Status: AC
  Filled 2016-07-29: qty 1

## 2016-07-29 MED ORDER — KETOROLAC TROMETHAMINE 15 MG/ML IJ SOLN
7.5000 mg | Freq: Four times a day (QID) | INTRAMUSCULAR | Status: AC
  Administered 2016-07-29 – 2016-07-30 (×4): 7.5 mg via INTRAVENOUS
  Filled 2016-07-29 (×4): qty 1

## 2016-07-29 MED ORDER — CEFAZOLIN SODIUM-DEXTROSE 2-4 GM/100ML-% IV SOLN
2.0000 g | Freq: Four times a day (QID) | INTRAVENOUS | Status: AC
  Administered 2016-07-29 – 2016-07-30 (×2): 2 g via INTRAVENOUS
  Filled 2016-07-29 (×2): qty 100

## 2016-07-29 MED ORDER — DIPHENHYDRAMINE HCL 12.5 MG/5ML PO ELIX: 12.5000 mg | ORAL_SOLUTION | ORAL | Status: DC | PRN

## 2016-07-29 MED ORDER — LACTATED RINGERS IV SOLN
INTRAVENOUS | Status: DC
  Administered 2016-07-29 (×2): via INTRAVENOUS

## 2016-07-29 MED ORDER — ALBUMIN HUMAN 5 % IV SOLN
INTRAVENOUS | Status: DC | PRN
  Administered 2016-07-29: 13:00:00 via INTRAVENOUS

## 2016-07-29 MED ORDER — PROPOFOL 10 MG/ML IV BOLUS
INTRAVENOUS | Status: DC | PRN
  Administered 2016-07-29: 100 mg via INTRAVENOUS

## 2016-07-29 MED ORDER — HYDROMORPHONE HCL 1 MG/ML IJ SOLN: 0.5000 mg | INTRAMUSCULAR | Status: DC | PRN

## 2016-07-29 MED ORDER — POLYETHYLENE GLYCOL 3350 17 G PO PACK
17.0000 g | PACK | Freq: Every day | ORAL | Status: DC | PRN
  Administered 2016-07-31: 17 g via ORAL
  Filled 2016-07-29: qty 1

## 2016-07-29 MED ORDER — BUPIVACAINE-EPINEPHRINE (PF) 0.25% -1:200000 IJ SOLN
INTRAMUSCULAR | Status: AC
  Filled 2016-07-29: qty 30

## 2016-07-29 MED ORDER — DEXAMETHASONE SODIUM PHOSPHATE 10 MG/ML IJ SOLN
INTRAMUSCULAR | Status: DC | PRN
  Administered 2016-07-29: 5 mg via INTRAVENOUS

## 2016-07-29 MED ORDER — ACETAMINOPHEN 10 MG/ML IV SOLN: 1000.0000 mg | Freq: Once | INTRAVENOUS | Status: DC

## 2016-07-29 MED ORDER — SODIUM CHLORIDE 0.9 % IV SOLN
INTRAVENOUS | Status: DC | PRN
  Administered 2016-07-29: 2000 mg via TOPICAL

## 2016-07-29 MED ORDER — MAGNESIUM CITRATE PO SOLN: 1.0000 | Freq: Once | ORAL | Status: DC | PRN

## 2016-07-29 MED ORDER — FENTANYL CITRATE (PF) 100 MCG/2ML IJ SOLN
INTRAMUSCULAR | Status: DC | PRN
  Administered 2016-07-29: 100 ug via INTRAVENOUS
  Administered 2016-07-29: 25 ug via INTRAVENOUS

## 2016-07-29 MED ORDER — SODIUM CHLORIDE 0.9 % IV SOLN
75.0000 mL/h | INTRAVENOUS | Status: DC
  Administered 2016-07-29: 75 mL/h via INTRAVENOUS

## 2016-07-29 MED ORDER — SODIUM CHLORIDE 0.9 % IV SOLN: 75.0000 mL/h | INTRAVENOUS | Status: DC

## 2016-07-29 MED ORDER — MENTHOL 3 MG MT LOZG: 1.0000 | LOZENGE | OROMUCOSAL | Status: DC | PRN

## 2016-07-29 SURGICAL SUPPLY — 57 items
BLADE SAW SAG 73X25 THK (BLADE) ×2
BLADE SAW SGTL 73X25 THK (BLADE) ×1 IMPLANT
BRUSH FEMORAL CANAL (MISCELLANEOUS) IMPLANT
CAPT HIP TOTAL 2 ×2 IMPLANT
COVER SURGICAL LIGHT HANDLE (MISCELLANEOUS) ×3 IMPLANT
DRAPE INCISE IOBAN 66X45 STRL (DRAPES) IMPLANT
DRAPE ORTHO SPLIT 77X108 STRL (DRAPES) ×6
DRAPE SURG ORHT 6 SPLT 77X108 (DRAPES) ×2 IMPLANT
DRSG AQUACEL AG ADV 3.5X10 (GAUZE/BANDAGES/DRESSINGS) ×2 IMPLANT
DRSG MEPILEX BORDER 4X12 (GAUZE/BANDAGES/DRESSINGS) ×3 IMPLANT
DURAPREP 26ML APPLICATOR (WOUND CARE) ×6 IMPLANT
ELECT BLADE 6.5 EXT (BLADE) IMPLANT
ELECT REM PT RETURN 9FT ADLT (ELECTROSURGICAL) ×3
ELECTRODE REM PT RTRN 9FT ADLT (ELECTROSURGICAL) ×1 IMPLANT
EVACUATOR 1/8 PVC DRAIN (DRAIN) IMPLANT
FACESHIELD WRAPAROUND (MASK) ×9 IMPLANT
FACESHIELD WRAPAROUND OR TEAM (MASK) ×3 IMPLANT
GLOVE BIOGEL PI IND STRL 8 (GLOVE) ×2 IMPLANT
GLOVE BIOGEL PI IND STRL 8.5 (GLOVE) ×1 IMPLANT
GLOVE BIOGEL PI INDICATOR 8 (GLOVE) ×4
GLOVE BIOGEL PI INDICATOR 8.5 (GLOVE) ×2
GLOVE ECLIPSE 8.0 STRL XLNG CF (GLOVE) ×6 IMPLANT
GLOVE SURG ORTHO 8.5 STRL (GLOVE) ×6 IMPLANT
GOWN STRL REUS W/ TWL LRG LVL3 (GOWN DISPOSABLE) ×2 IMPLANT
GOWN STRL REUS W/TWL 2XL LVL3 (GOWN DISPOSABLE) ×6 IMPLANT
GOWN STRL REUS W/TWL LRG LVL3 (GOWN DISPOSABLE) ×6
HANDPIECE INTERPULSE COAX TIP (DISPOSABLE)
IMMOBILIZER KNEE 20 (SOFTGOODS) IMPLANT
IMMOBILIZER KNEE 22 UNIV (SOFTGOODS) ×3 IMPLANT
KIT BASIN OR (CUSTOM PROCEDURE TRAY) ×3 IMPLANT
KIT ROOM TURNOVER OR (KITS) ×3 IMPLANT
MANIFOLD NEPTUNE II (INSTRUMENTS) ×3 IMPLANT
NEEDLE 22X1 1/2 (OR ONLY) (NEEDLE) ×3 IMPLANT
NS IRRIG 1000ML POUR BTL (IV SOLUTION) ×3 IMPLANT
PACK TOTAL JOINT (CUSTOM PROCEDURE TRAY) ×3 IMPLANT
PACK UNIVERSAL I (CUSTOM PROCEDURE TRAY) ×3 IMPLANT
PAD ARMBOARD 7.5X6 YLW CONV (MISCELLANEOUS) ×3 IMPLANT
PRESSURIZER FEMORAL UNIV (MISCELLANEOUS) IMPLANT
SET HNDPC FAN SPRY TIP SCT (DISPOSABLE) IMPLANT
STAPLER VISISTAT 35W (STAPLE) IMPLANT
SUCTION FRAZIER HANDLE 10FR (MISCELLANEOUS) ×2
SUCTION TUBE FRAZIER 10FR DISP (MISCELLANEOUS) ×1 IMPLANT
SUT BONE WAX W31G (SUTURE) IMPLANT
SUT ETHIBOND NAB CT1 #1 30IN (SUTURE) ×13 IMPLANT
SUT MNCRL AB 3-0 PS2 18 (SUTURE) ×3 IMPLANT
SUT VIC AB 0 CT1 27 (SUTURE) ×6
SUT VIC AB 0 CT1 27XBRD ANBCTR (SUTURE) ×2 IMPLANT
SUT VIC AB 1 CT1 27 (SUTURE) ×6
SUT VIC AB 1 CT1 27XBRD ANBCTR (SUTURE) ×2 IMPLANT
SUT VIC AB 2-0 CT1 27 (SUTURE) ×3
SUT VIC AB 2-0 CT1 TAPERPNT 27 (SUTURE) ×1 IMPLANT
SYR CONTROL 10ML LL (SYRINGE) ×3 IMPLANT
TOWEL OR 17X24 6PK STRL BLUE (TOWEL DISPOSABLE) ×3 IMPLANT
TOWEL OR 17X26 10 PK STRL BLUE (TOWEL DISPOSABLE) ×3 IMPLANT
TOWER CARTRIDGE SMART MIX (DISPOSABLE) IMPLANT
WATER STERILE IRR 1000ML POUR (IV SOLUTION) ×12 IMPLANT
WRAP KNEE MAXI GEL POST OP (GAUZE/BANDAGES/DRESSINGS) ×2 IMPLANT

## 2016-07-29 NOTE — Anesthesia Procedure Notes (Signed)
Procedure Name: Intubation Date/Time: 07/29/2016 12:17 PM Performed by: Freddie Breech Pre-anesthesia Checklist: Patient identified, Emergency Drugs available, Suction available, Patient being monitored and Timeout performed Patient Re-evaluated:Patient Re-evaluated prior to inductionOxygen Delivery Method: Circle system utilized Preoxygenation: Pre-oxygenation with 100% oxygen Intubation Type: IV induction Ventilation: Mask ventilation without difficulty Laryngoscope Size: Mac and 4 Grade View: Grade I Tube type: Oral Tube size: 7.0 mm Number of attempts: 1 Airway Equipment and Method: Patient positioned with wedge pillow and Stylet Placement Confirmation: ETT inserted through vocal cords under direct vision,  positive ETCO2 and breath sounds checked- equal and bilateral Secured at: 23 cm Tube secured with: Tape Dental Injury: Teeth and Oropharynx as per pre-operative assessment

## 2016-07-29 NOTE — Progress Notes (Signed)
Dr Jefm Petty here to see pt-fully updated

## 2016-07-29 NOTE — Op Note (Signed)
NAME:  BONIFACE, GOFFE NO.:  [id number]  MEDICAL RECORD NO.:  [id number]  LOCATION:  5N09C                        FACILITY:  [facility]  PHYSICIAN:  Vonna Kotyk. Dakin Madani, M.D.DATE OF BIRTH:  04/13/1932  DATE OF PROCEDURE:  07/29/2016 DATE OF DISCHARGE:                              OPERATIVE REPORT   PREOPERATIVE DIAGNOSIS:  End-stage osteoarthritis, right hip.  POSTOPERATIVE DIAGNOSIS:  End-stage osteoarthritis, right hip.  PROCEDURE:  Right total hip replacement.  SURGEON:  Vonna Kotyk. Durward Fortes, M.D.  ASSISTANT:  Aaron Edelman D. Petrarca, P.A.-C., who was present throughout the operative procedure to ensure its timely completion.  ANESTHESIA:  General.  COMPLICATIONS:  None.  COMPONENTS:  DePuy AML 13.5 small stature femoral component, a 36 mm outer diameter hip ball with a +5 mm neck length, 52 mm outer diameter Gription 3 acetabular component with a single acetabular screw.  An apex- hole eliminator and a Marathon polyethylene liner +4 with a 10-degree posterior lip.  Components were press-fit.  DESCRIPTION OF PROCEDURE:  Mr. Malone was met with his family in the holding area, identified the right hip as the appropriate operative site, and marked it accordingly.  The patient was then transported to room #7 and placed under general anesthesia without difficulty.  The patient was then placed in the lateral decubitus position with the right side up and secured to the operating table with the Innomed hip system. Time-out was called.  Right lower extremity was then prepped from the iliac crest to the ankle with chlorhexidine scrub and DuraPrep x2.  Sterile draping was performed.  Time-out was called again.  A routine Southern incision was utilized and via sharp dissection, carried down to subcutaneous tissue.  Small bleeders were Bovie coagulated.  Adipose tissue was incised to the iliotibial band.  The fascia was incised.  Self-retaining retractors were  inserted.  With retraction, short external rotators were identified.  Tendinous structures were tagged with 0 Ethibond suture and then releasing the capsule.  Capsule was incised along the femoral neck and head, and the joint was entered.  There was a minimal clear yellow joint effusion. The head was then dislocated posteriorly.  A calcar guide was used to obtain the appropriate calcar angle using the oscillating saw.  The head was osteotomized.  Starter hole was then made in the piriformis fossa, and the canal finer was inserted.  Reaming was performed sequentially to 13 mm to accept a 13.5 component.  The rasp was inserted sequentially to 13.5 mm, we osteotomized the neck as it was too long and still contained portions of the head.  The final cut was about a fingerbreadth proximal to the lesser trochanter.  The calcar was intact in excellent angle, and the 13.5 mm small stature femoral component fit nicely on the calcar.  Retractors were then placed about the acetabulum.  The labrum was sharply excised.  Reaming was performed sequentially to 51 mm to accept a 52 component.  We trialed 50 and 52 components. Acetabular component of 50 would completely seat, the 52 would not.  Therefore, the final Gription 52 mm outer diameter acetabular component was impacted, followed by the trial Marathon polyethylene liner, and then the 13.5 mm rasp.  We applied the 36 mm outer diameter hip ball with a 5 mm neck length and then reduced it with flexion and abduction with subluxation of the hip.  Therefore, we removed all of the trial components and repositioned the acetabular component in more flexion.  We applied the trial components and then reduced the hip and then through a full range of motion, it was perfectly stable.  The wounds were irrigated with saline solution.  The trial components removed.  I inserted a single 6.5 mm acetabular screw, 25 mm in length, and then applied the  apex-hole eliminator, followed by the Marathon polyethylene liner +4 with a 10- degree lip.  Wound was again irrigated with saline solution.  The final femoral component was then impacted in place.  The [facility] taper neck was cleaned, and a 36 mm metallic hip ball with a +5 neck length was then impacted.  Acetabulum was clear of any material.  The hip was reduced, and again through full range of motion, it remained perfectly stable.  The wound was irrigated with saline solution.  We infiltrated the capsule with 0.25% Marcaine with epinephrine, applied tranexamic acid topically.  The capsule was closed anatomically with #1 Ethibond. The short external rotators closed with the similar material.  The iliotibial band was closed with running #1 Vicryl.  The wound was closed in several layers with Vicryl and Monocryl.  Skin closed with skin clips.  Sterile bulky dressing was applied.  The patient did have a slight decrease in blood pressure which felt possibly related to blood loss.  He did ooze throughout the case, he had been on aspirin.  His hemoglobin was 14 preoperatively, checked it was 12 intraoperatively.  Anesthesia felt it would be worthwhile to place him on the Neo-Synephrine drip and albumin, given a unit of blood in recovery.  His blood pressure was stable at 110/68, and he will be monitored in recovery room.  There were no otherwise untoward events.     Vonna Kotyk. Durward Fortes, M.D.     PWW/MEDQ  D:  07/29/2016  T:  07/29/2016  Job:  [id number]

## 2016-07-29 NOTE — Progress Notes (Signed)
SBP 60's/40's, NSR 82-85, pt calmer, answering questions approp., O2 sat 100% on 2L . Dr Jefm Petty fully updated. Order to restart NEO drip and begin transfusion 1 unit PRBC.  Biagio Borg PA at bedside.

## 2016-07-29 NOTE — Progress Notes (Signed)
Cardiology PA here. EKG done.

## 2016-07-29 NOTE — H&P (Signed)
  The recent History & Physical has been reviewed. I have personally examined the patient today. There is no interval change to the documented History & Physical. The patient would like to proceed with the procedure.  [name] W 07/29/2016,  11:40 AM

## 2016-07-29 NOTE — Progress Notes (Signed)
NEO drip off.

## 2016-07-29 NOTE — Progress Notes (Signed)
Pt very agitated, combative upon adm to PACU, pulled IV out, shouting, "Turn me loose". Drs Ola Spurr & Durward Fortes at bedside.  New left AC PIV started by C. Maneness CRNA.. CBC drawn & sent. Will cont to mointor closely.

## 2016-07-29 NOTE — Progress Notes (Signed)
Dr Ola Spurr & Durward Fortes here. Pt calm, oriented, comfortable. SBP > 100, SR 80's, weaning NEO drip. MD's aware of H&H results-pt to tx with just 1 unit PRBC. Will cont to monitor.

## 2016-07-29 NOTE — Anesthesia Postprocedure Evaluation (Signed)
Anesthesia Post Note  Patient: Jimmy Knox  Procedure(s) Performed: Procedure(s) (LRB): TOTAL HIP ARTHROPLASTY (Right)  Patient location during evaluation: PACU Anesthesia Type: General Level of consciousness: awake and alert Pain management: pain level controlled Vital Signs Assessment: post-procedure vital signs reviewed and stable Respiratory status: spontaneous breathing, nonlabored ventilation, respiratory function stable and patient connected to nasal cannula oxygen Cardiovascular status: blood pressure returned to baseline and stable Postop Assessment: no signs of nausea or vomiting Anesthetic complications: no    Last Vitals:  Vitals:   07/29/16 1804 07/29/16 1810  BP:  109/67  Pulse:    Resp:    Temp: 36.8 C 36.8 C    Last Pain:  Vitals:   07/29/16 1804  TempSrc:   PainSc: 1                  [name],W. EDMOND

## 2016-07-29 NOTE — Transfer of Care (Signed)
Immediate Anesthesia Transfer of Care Note  Patient: Jimmy Knox  Procedure(s) Performed: Procedure(s): TOTAL HIP ARTHROPLASTY (Right)  Patient Location: PACU  Anesthesia Type:General  Level of Consciousness: confused  Airway & Oxygen Therapy: Patient Spontanous Breathing  Post-op Assessment: Report given to RN and Post -op Vital signs reviewed and stable  Post vital signs: Reviewed and stable  Last Vitals:  Vitals:   07/29/16 1012  BP: 121/65  Pulse: 72  Resp: 20  Temp: 36.9 C    Last Pain:  Vitals:   07/29/16 1012  TempSrc: Oral         Complications: No apparent anesthesia complications

## 2016-07-29 NOTE — Anesthesia Preprocedure Evaluation (Addendum)
Anesthesia Evaluation   Patient awake    Reviewed: reviewed documented beta blocker date and time   Airway Mallampati: II  TM Distance: >3 FB Neck ROM: Full    Dental  (+) Upper Dentures, Lower Dentures, Dental Advisory Given   Pulmonary former smoker,    breath sounds clear to auscultation       Cardiovascular hypertension, Pt. on medications and Pt. on home beta blockers + CAD, + Past MI and + Peripheral Vascular Disease   Rhythm:Regular Rate:Normal     Neuro/Psych    GI/Hepatic   Endo/Other  diabetes  Renal/GU      Musculoskeletal  (+) Arthritis , Osteoarthritis,    Abdominal   Peds  Hematology   Anesthesia Other Findings   Reproductive/Obstetrics                            Anesthesia Physical Anesthesia Plan  ASA: III  Anesthesia Plan: General   Post-op Pain Management:    Induction: Intravenous  Airway Management Planned: Oral ETT  Additional Equipment:   Intra-op Plan:   Post-operative Plan: Extubation in OR  Informed Consent: I have reviewed the patients History and Physical, chart, labs and discussed the procedure including the risks, benefits and alternatives for the proposed anesthesia with the patient or authorized representative who has indicated his/her understanding and acceptance.   Dental advisory given  Plan Discussed with: CRNA  Anesthesia Plan Comments:        Anesthesia Quick Evaluation

## 2016-07-29 NOTE — Op Note (Signed)
PATIENT ID:      Jimmy Knox  MRN:     [id number] DOB/AGE:    1932/02/04 / 82 y.o.       OPERATIVE REPORT    DATE OF PROCEDURE:  07/29/2016       PREOPERATIVE DIAGNOSIS:END STAGE   RIGHT HIP OSTEOARTHRITIS                                                       Estimated body mass index is 28.64 kg/m as calculated from the following:   Height as of this encounter: 6' (1.829 m).   Weight as of this encounter: 95.8 kg (211 lb 3 oz).     POSTOPERATIVE DIAGNOSIS:   RIGHT HIP OSTEOARTHRITIS -SAME                                                                    Estimated body mass index is 28.64 kg/m as calculated from the following:   Height as of this encounter: 6' (1.829 m).   Weight as of this encounter: 95.8 kg (211 lb 3 oz).     PROCEDURE:  Procedure(s):RIGHT TOTAL HIP ARTHROPLASTY     SURGEON:  [name], MD    ASSISTANT:   Biagio Borg, PA-C   (Present and scrubbed throughout the case, critical for assistance with exposure, retraction, instrumentation, and closure.)          ANESTHESIA: general     DRAINS: none :      TOURNIQUET TIME: * No tourniquets in log *    COMPLICATIONS:  None   CONDITION:  stable  PROCEDURE IN DETAILFZ:[id number]   WHITFIELD, PETER W 07/29/2016, 2:30 PM

## 2016-07-29 NOTE — Progress Notes (Signed)
Dr Jefm Petty fully updated, aware Dr Claiborne Billings at bedside. Orders to tx pt to 5N telemetry bed.

## 2016-07-29 NOTE — Progress Notes (Signed)
Orthopedic Tech Progress Note Patient Details:  Jimmy Knox 1932-03-11 [id number] Patient already has knee immobilizer. Patient ID: Jimmy Knox, male   DOB: [birth year], 82 y.o.   MRN: [id number]   Jimmy Knox 07/29/2016, 9:36 PM

## 2016-07-29 NOTE — Consult Note (Signed)
Cardiology Consultation Note    Patient ID: Jimmy Knox, MRN: [id number], DOB/AGE: December 26, 1932 82 y.o. Admit date: 07/29/2016   Date of Consult: 07/29/2016 Primary Physician: Asencion Noble, MD Primary Cardiologist: Dr. [name]  Chief Complaint: hypotension Reason for Consultation: hypotension Requesting MD: Dr. Durward Fortes  HPI: Jimmy Knox is a 82 y.o. male with history of CAD (prior MI/stent around 2000), carotid disease (<50% bilaterally 05/2016), HTN, HLD, ischemic cardiomyopathy, LV dysfunction (technically chronic combined CHF but no prior hospitalizations for such), DM, former tobacco abuse whom we are asked to see for post-op hypotension. Prior nuclear stress test 09/2015 showed a large area of myocardial scar primarily in the inferior, inferoseptal, and inferolateral walls extending from the apex to the base. There was a mild degree of peri-infarct ischemia, calculated LVEF 42%. It was an intermediate risk study. 2D Echo 03/2015: mild LVH, EF 40%, akinesis of mid-apicalanteroseptaland inferior myocardium, hyopkinesis of the basal inferolateral myocardium, grade 1 DD, mild LAE/RAE. He has been managed medically since that time in the absence of any anginal symptoms. He was cleared for surgery by his cardiologist as he was felt to be doing well.  He underwent right total hip arthroplasty today for end-stage arthritis. Per d/w nursing staff, the standard intraop neosynephrine was stopped prior to patient coming to PACU and BP was 1-teens. As he began to wake up, he was confused, highly agitated, and hollering "turn me loose." He pulled out his IVs and kept pulling at all monitors. He calmed down without pharmacologic intervention and tolerated having his BP checked. He was then noted to have a BP of 60/40s. This was rechecked on multiple extremities per nurse, who reports the whole time he remained NSR with HR generally 80s, O2 sat 100% on 2L. He was subsequently restarted on neosynephrine  and had 1 U PRBC ordered. Estimated blood loss about a liter per prelim report. Labs show Hgb 11.3 (prev 14.6), WBC 18k, CBG 129. Preop labs notable for Cr of 1.23 which seemed generally stable from prior. His BP has since come up to 107/73 and neosynephrine is beginning to be weaned per primary team instructions. The patient is minimally groggy and A+Ox3 at present time. Denies any CP, SOB, palpitations. No syncope or LOC noted. No fever.  Past Medical History:  Diagnosis Date  . Arthritis   . Cancer ([place])    skin  . Carotid artery disease ([place])    a. <50% bilaterally 05/2016.  Marland Kitchen Chronic combined systolic and diastolic CHF (congestive heart failure) ([place])   . Coronary artery disease    a. prior MI/stent around 2000. b. Nuc 09/2015 -> abnl, mgmd medically.  . Diabetes mellitus without complication ([facility])    diet controled  . Hyperlipidemia   . Hypertension   . Ischemic cardiomyopathy   . Myocardial infarction ([place])   . Pneumonia    hx      Surgical History:  Past Surgical History:  Procedure Laterality Date  . BACK SURGERY  1992  . CAROTID STENT    . COLONOSCOPY N/A 09/28/2015   Procedure: COLONOSCOPY;  Surgeon: Rogene Houston, MD;  Location: [facility];  Service: Endoscopy;  Laterality: N/A;  955  . CORONARY ANGIOPLASTY WITH STENT PLACEMENT    . CYSTOSCOPY W/ URETERAL STENT PLACEMENT       Home Meds: Prior to Admission medications   Medication Sig Start Date End Date Taking? Authorizing Provider  amitriptyline (ELAVIL) 50 MG tablet Take 50 mg by mouth at bedtime.  Yes Historical Provider, MD  aspirin 81 MG chewable tablet Chew 81 mg by mouth at bedtime.    Yes Historical Provider, MD  Cholecalciferol (VITAMIN D3) 2000 units capsule Take 2,000 Units by mouth daily.   Yes Historical Provider, MD  furosemide (LASIX) 20 MG tablet Take 1 tablet (20 mg total) by mouth daily. 07/14/16  Yes Herminio Commons, MD  losartan (COZAAR) 25 MG tablet Take 1 tablet (25 mg total) by mouth  daily. 11/27/15  Yes Herminio Commons, MD  metoprolol succinate (TOPROL-XL) 25 MG 24 hr tablet Take 25 mg by mouth daily.   Yes Historical Provider, MD  simvastatin (ZOCOR) 40 MG tablet Take 20 mg by mouth every evening.    Yes Historical Provider, MD  vitamin E 400 UNIT capsule Take 400 Units by mouth daily.   Yes Historical Provider, MD    Inpatient Medications:  . sodium chloride  10 mL/hr Intravenous Once  . acetaminophen  1,000 mg Intravenous Once  . ceFAZolin      . chlorhexidine  60 mL Topical Once  . chlorhexidine  60 mL Topical Once  . fentaNYL      . ketorolac  7.5 mg Intravenous Q6H  . tranexamic acid (CYKLOKAPRON) topical -INTRAOP  2,000 mg Topical To OR   . sodium chloride    . lactated ringers 50 mL/hr at 07/29/16 1020    Allergies: No Known Allergies  Social History   Social History  . Marital status: Married    Spouse name: N/A  . Number of children: N/A  . Years of education: N/A   Occupational History  . Retired    Social History Main Topics  . Smoking status: Former Smoker    Packs/day: 0.50    Years: 20.00    Types: Cigarettes    Start date: 12/30/1943    Quit date: 12/29/1968  . Smokeless tobacco: Never Used  . Alcohol use No  . Drug use: No  . Sexual activity: Not on file   Other Topics Concern  . Not on file   Social History Narrative  . No narrative on file     Family History  Problem Relation Age of Onset  . Diabetes Mellitus II Brother     x2 brothers  . Alzheimer's disease Mother      Review of Systems: All other systems reviewed and are otherwise negative except as noted above.  Labs: No results for input(s): CKTOTAL, CKMB, TROPONINI in the last 72 hours. Lab Results  Component Value Date   WBC 18.2 (H) 07/29/2016   HGB 11.3 (L) 07/29/2016   HCT 34.3 (L) 07/29/2016   MCV 93.0 07/29/2016   PLT 198 07/29/2016   Radiology/Studies:  Dg Chest 2 View  Result Date: 07/21/2016 CLINICAL DATA:  Preop for total hip  arthroplasty. EXAM: CHEST  2 VIEW COMPARISON:  Chest x-ray 02/10/2013 FINDINGS: Aging cardiac silhouette, mediastinal and hilar contours are stable. Mild tortuosity of the thoracic aorta and slightly prominent mediastinal contours are stable. There are fairly extensive chronic scarring changes likely a combination of emphysema and chronic bronchitic change. No definite acute pulmonary findings or worrisome pulmonary lesions. The bony thorax is intact. IMPRESSION: Chronic pulmonary scarring changes without definite acute overlying pulmonary process. Electronically Signed   By: Marijo Sanes M.D.   On: 07/21/2016 14:27   Wt Readings from Last 3 Encounters:  07/29/16 211 lb 3 oz (95.8 kg)  07/21/16 211 lb 3.2 oz (95.8 kg)  07/14/16 214 lb (97.1 kg)  EKG: NSR 87bpm, nonspecific T wave flattening. Flatter than before diffusely but no specific ischemic changes.  Physical Exam: Blood pressure 107/73, pulse 84, temperature 97.8 F (36.6 C), temperature source Oral, resp. rate 20, height 6' (1.829 m), weight 211 lb 3 oz (95.8 kg), SpO2 98 %. Body mass index is 28.64 kg/m. General: Well developed, well nourished WM  in no acute distress. Laying almost near flat in bed without any orthopnea Head: Normocephalic, atraumatic, sclera non-icteric, no xanthomas, nares are without discharge.  Neck:. JVD not elevated. Lungs: Clear bilaterally to auscultation without wheezes, rales, or rhonchi. Breathing is unlabored. Heart: RRR with S1 S2. No murmurs, rubs, or gallops appreciated. Abdomen: Soft, non-tender, non-distended with normoactive bowel sounds. No hepatomegaly. No rebound/guarding. No obvious abdominal masses. Msk:  Strength and tone appear normal for age. Extremities: No clubbing or cyanosis. No edema.  Distal pedal pulses are 2+ and equal bilaterally. Neuro: Alert and oriented X 3. No facial asymmetry. No focal deficit. Moves all extremities spontaneously. Psych:  Responds to questions appropriately  with a normal affect.     Assessment and Plan   82 y.o. male with history of Jimmy Knox is an 82 y/o M with history of CAD (prior MI/stent around 2000), carotid disease (<50% bilaterally 05/2016), HTN, HLD, ischemic cardiomyopathy, LV dysfunction (technically chronic combined CHF but no prior hospitalizations for such), DM, former tobacco abuse whom we are asked to see for post-op hypotension.  1. Post-op hypotension - ? Anesthesia reaction, also possible component of contribution from ABL anemia. Will hold patient's home antihypertensives for now with an eye towards early resumption of at least beta blocker therapy upon stabilization of BP.  2. CAD - denies CP. EKG nonacute. Will review plan with MD. Resume ASA when clinically able.  3. Chronic combined CHF - holding meds as above. Follow volume status daily. Does not appear in acute HF. Will order f/u echo.  4. ABL anemia - may be contributed. Receiving 1 U PRBC per primary team. F/u CBC in AM.  Signed, Charlie Pitter PA-C 07/29/2016, 4:48 PM Pager: [phone]  Patient seen and examined. Agree with assessment and plan.  Jimmy Knox is a very pleasant 82 year old Caucasian male who suffered a myocardial infarction 17 years ago at which time he underwent stenting (question vessel), has known mild carotid disease, hypertension, hyperlipidemia, and is felt to have ischemic cardiomyopathy with an ejection fraction in the 40% range.  His last nuclear perfusion study in 2016 showed a large area of inferior, inferoseptal and inferolateral scar without associated ischemia with a calculated ejection fraction at 42%.  His last echo Doppler study in April 2016 showed an EF of 40% with akinesis of his mid apical, anteroseptal and inferior wall with hypokinesis of the basal inferolateral wall.  The patient has been without any anginal symptomatology.     he underwent right hip replacement surgery today and after getting out of anesthesia became markedly  agitated.  He became hypotensive.  He was estimated to have a blood loss during surgery of 1100-1300 cc  .  And his hypotension responded to 1 unit packed red blood cells and Neo-Synephrine.  He subsequent he has been successfully weaned off Neo-Synephrine and presently his heart rate is stable in the 80s with a blood pressure 118/75.  Presently, he is alert and oriented.  He is pain-free.  There is no JVD.  His lungs are clear.  Rhythm is regular without ectopy.  There is a faint 1/6 systolic murmur.  Abdomen was soft and nontender with positive bowel sounds.  There was no significant edema.  His ECG reveals normal sinus rhythm at 86 bpm.  There is evidence for his old prior inferior MI with inferior Q waves.  There are no significant ST T abnormalities.  Clinically, he appears hemodynamically stable following his recent postanesthesia episode with associated hypotension.  We will cycle enzymes to make certain he did not have any potential demand ischemia.  A 2-D echo Doppler study will be obtained.  The patient is to be moved to 5 N postoperatively, but would recommend postoperative telemetry.  At present we will hold his home dose of losartan and metoprolol in addition to furosemide.  We will follow-up with patient tomorrow.                                                                                                                                          Troy Sine, MD, [facility] 07/29/2016 5:47 PM

## 2016-07-30 ENCOUNTER — Inpatient Hospital Stay (HOSPITAL_COMMUNITY): Payer: Medicare Other

## 2016-07-30 ENCOUNTER — Other Ambulatory Visit (HOSPITAL_COMMUNITY): Payer: Medicare Other

## 2016-07-30 DIAGNOSIS — Z9289 Personal history of other medical treatment: Secondary | ICD-10-CM

## 2016-07-30 DIAGNOSIS — I9589 Other hypotension: Secondary | ICD-10-CM

## 2016-07-30 HISTORY — DX: Personal history of other medical treatment: Z92.89

## 2016-07-30 LAB — ECHOCARDIOGRAM COMPLETE
HEIGHTINCHES: 72 in
Weight: 4206.4 oz

## 2016-07-30 LAB — POCT I-STAT 4, (NA,K, GLUC, HGB,HCT)
Glucose, Bld: 146 mg/dL — ABNORMAL HIGH (ref 65–99)
HCT: 37 % — ABNORMAL LOW (ref 39.0–52.0)
Hemoglobin: 12.6 g/dL — ABNORMAL LOW (ref 13.0–17.0)
Potassium: 4.5 mmol/L (ref 3.5–5.1)
Sodium: 141 mmol/L (ref 135–145)

## 2016-07-30 LAB — CBC
HEMATOCRIT: 31.8 % — AB (ref 39.0–52.0)
Hemoglobin: 10.9 g/dL — ABNORMAL LOW (ref 13.0–17.0)
MCH: 31.1 pg (ref 26.0–34.0)
MCHC: 34.3 g/dL (ref 30.0–36.0)
MCV: 90.9 fL (ref 78.0–100.0)
Platelets: 155 10*3/uL (ref 150–400)
RBC: 3.5 MIL/uL — ABNORMAL LOW (ref 4.22–5.81)
RDW: 13 % (ref 11.5–15.5)
WBC: 15.5 10*3/uL — ABNORMAL HIGH (ref 4.0–10.5)

## 2016-07-30 LAB — BASIC METABOLIC PANEL
ANION GAP: 8 (ref 5–15)
BUN: 22 mg/dL — ABNORMAL HIGH (ref 6–20)
CALCIUM: 8.1 mg/dL — AB (ref 8.9–10.3)
CO2: 23 mmol/L (ref 22–32)
Chloride: 103 mmol/L (ref 101–111)
Creatinine, Ser: 1.18 mg/dL (ref 0.61–1.24)
GFR, EST NON AFRICAN AMERICAN: 56 mL/min — AB (ref >60)
Glucose, Bld: 145 mg/dL — ABNORMAL HIGH (ref 65–99)
Potassium: 4.1 mmol/L (ref 3.5–5.1)
Sodium: 134 mmol/L — ABNORMAL LOW (ref 135–145)

## 2016-07-30 LAB — TROPONIN I: TROPONIN I: 0.03 ng/mL — AB (ref <0.03)

## 2016-07-30 MED ORDER — PERFLUTREN LIPID MICROSPHERE
INTRAVENOUS | Status: AC
  Filled 2016-07-30: qty 10

## 2016-07-30 MED ORDER — PERFLUTREN LIPID MICROSPHERE
1.0000 mL | INTRAVENOUS | Status: AC | PRN
  Administered 2016-07-30: 2 mL via INTRAVENOUS
  Filled 2016-07-30: qty 10

## 2016-07-30 NOTE — Progress Notes (Signed)
Physical Therapy Treatment Patient Details Name: Jimmy Knox MRN: [id number] DOB: 1932-11-04 Today's Date: 07/30/2016    History of Present Illness Pt is an 82 y/o male s/p R THA, posterior precautions. PMH including but not limited to CHF, CAD, MI with stenting (2000), DM, and HTN.                                                                                                                                       PT Comments    Pt presented sitting OOB in recliner, awake and willing to participate in therapy session. Pt's daughter and granddaughter were present throughout session. Pt's granddaughter reported that she would be at home with him 24/7 upon d/c and is able to assist him physically. Pt making good progress towards achieving his goals. He was able to ambulate this session with min guard for safety. Pt and pt's family discussed their concerns regarding d/c disposition with therapist; however, PT continues to recommend that the pt d/c home with [facility] PT services as he has already shown good progress with his functional mobility. Pt would continue to benefit from skilled physical therapy services at this time while admitted and after d/c to address his limitations in order to improve his overall safety and independence with functional mobility.    Follow Up Recommendations  Home health PT;Supervision/Assistance - 24 hour     Equipment Recommendations  Rolling walker with 5" wheels;3in1 (PT)    Recommendations for Other Services       Precautions / Restrictions Precautions Precautions: Posterior Hip;Fall Precaution Booklet Issued: Yes (comment) Precaution Comments: Educated pt, pt's daughter and pt's granddaughter on precautions Required Braces or Orthoses: Knee Immobilizer - Right Knee Immobilizer - Right: Other (comment) (in bed per MD's order) Restrictions Weight Bearing Restrictions: Yes RLE Weight Bearing: Weight bearing as tolerated    Mobility  Bed Mobility Overal  bed mobility: Needs Assistance Bed Mobility: Supine to Sit     Supine to sit: Min guard     General bed mobility comments: Pt was sitting OOB in recliner when PT entered room  Transfers Overall transfer level: Needs assistance Equipment used: Rolling walker (2 wheeled) Transfers: Sit to/from Stand Sit to Stand: Min guard         General transfer comment: pt required increased time to complete task  Ambulation/Gait Ambulation/Gait assistance: Min guard Ambulation Distance (Feet): 50 Feet Assistive device: Rolling walker (2 wheeled) Gait Pattern/deviations: Step-to pattern;Decreased step length - left;Decreased stance time - right;Decreased weight shift to right Gait velocity: decreased       Stairs            Wheelchair Mobility    Modified Rankin (Stroke Patients Only)       Balance Overall balance assessment: Needs assistance Sitting-balance support: Feet supported;No upper extremity supported Sitting balance-Leahy Scale: Fair     Standing balance support: Bilateral upper extremity supported;During functional activity  Standing balance-Leahy Scale: Poor Standing balance comment: RW for support                    Cognition Arousal/Alertness: Awake/alert Behavior During Therapy: WFL for tasks assessed/performed Overall Cognitive Status: Within Functional Limits for tasks assessed       Memory: Decreased recall of precautions              Exercises Total Joint Exercises Ankle Circles/Pumps: AROM;Strengthening;Right;10 reps;Seated Hip ABduction/ADduction: AROM;Strengthening;Right;10 reps;Seated Straight Leg Raises: AAROM;Strengthening;Right;10 reps;Seated    General Comments        Pertinent Vitals/Pain Pain Assessment: No/denies pain Pain Score: 7  Pain Location: R hip Pain Descriptors / Indicators: Aching;Spasm;Operative site guarding Pain Intervention(s): Monitored during session    Home Living Family/patient expects to be  discharged to:: Private residence Living Arrangements: Spouse/significant other Available Help at Discharge: Family;Available 24 hours/day Type of Home: House Home Access: Level entry   Home Layout: One level Home Equipment: Shower seat - built in      Prior Function Level of Independence: Independent          PT Goals (current goals can now be found in the care plan section) Acute Rehab PT Goals Patient Stated Goal: return home PT Goal Formulation: With patient/family Time For Goal Achievement: 08/06/16 Potential to Achieve Goals: Good Progress towards PT goals: Progressing toward goals    Frequency  7X/week    PT Plan Current plan remains appropriate    Co-evaluation             End of Session Equipment Utilized During Treatment: Gait belt Activity Tolerance: Patient limited by fatigue Patient left: in chair;with call bell/phone within reach;with family/visitor present     Time: 1422-1446 PT Time Calculation (min) (ACUTE ONLY): 24 min  Charges:  $Gait Training: 8-22 mins $Therapeutic Exercise: 8-22 mins                    G CodesClearnce Sorrel Lamonda Noxon August 12, 2016, 3:28 PM Sherie Don, [place], DPT [phone]

## 2016-07-30 NOTE — Discharge Instructions (Signed)
Information on my medicine - XARELTO (Rivaroxaban)  This medication education was reviewed with me or my healthcare representative as part of my discharge preparation.  The pharmacist that spoke with me during my hospital stay was:  Georgina Peer, [facility]  Why was Xarelto prescribed for you? Xarelto was prescribed for you to reduce the risk of blood clots forming after orthopedic surgery. The medical term for these abnormal blood clots is venous thromboembolism (VTE).  What do you need to know about xarelto ? Take your Xarelto ONCE DAILY at the same time every day. You may take it either with or without food.  If you have difficulty swallowing the tablet whole, you may crush it and mix in applesauce just prior to taking your dose.  Take Xarelto exactly as prescribed by your doctor and DO NOT stop taking Xarelto without talking to the doctor who prescribed the medication.  Stopping without other VTE prevention medication to take the place of Xarelto may increase your risk of developing a clot.  After discharge, you should have regular check-up appointments with your healthcare provider that is prescribing your Xarelto.    What do you do if you miss a dose? If you miss a dose, take it as soon as you remember on the same day then continue your regularly scheduled once daily regimen the next day. Do not take two doses of Xarelto on the same day.   Important Safety Information A possible side effect of Xarelto is bleeding. You should call your healthcare provider right away if you experience any of the following: ? Bleeding from an injury or your nose that does not stop. ? Unusual colored urine (red or dark brown) or unusual colored stools (red or black). ? Unusual bruising for unknown reasons. ? A serious fall or if you hit your head (even if there is no bleeding).  Some medicines may interact with Xarelto and might increase your risk of bleeding while on Xarelto. To help avoid  this, consult your healthcare provider or pharmacist prior to using any new prescription or non-prescription medications, including herbals, vitamins, non-steroidal anti-inflammatory drugs (NSAIDs) and supplements.  This website has more information on Xarelto: [email or web address]

## 2016-07-30 NOTE — Progress Notes (Signed)
Paged on-call cardiologist. Awaiting call.

## 2016-07-30 NOTE — Progress Notes (Signed)
CRITICAL VALUE ALERT  Critical value received:  Troponin 0.03  Date of notification:  07/30/16  Time of notification:  0018  Critical value read back: yes  Nurse who received alert:  Kristopher Glee, RN  MD notified (1st page):  Cardiology on call  Time of first page:  0028  MD notified (2nd page): Cardiology on call  Time of second page: 0106  MD notified (3rd page): Dr. Sharol Given (on-call for Dr. Durward Fortes)  Responding MD:  Dr. Sharol Given  Time MD responded:  [phone]

## 2016-07-30 NOTE — Progress Notes (Signed)
Received critical lab result: troponin = 0.03

## 2016-07-30 NOTE — Progress Notes (Signed)
No return page. Re-paged on call cardiologist. Awaiting call.

## 2016-07-30 NOTE — Evaluation (Signed)
Occupational Therapy Evaluation Patient Details Name: Jimmy Knox MRN: [id number] DOB: 02-10-1932 Today's Date: 07/30/2016    History of Present Illness Pt is an 82 y/o male s/p R THA, posterior precautions. PMH including but not limited to CHF, CAD, MI with stenting (2000), DM, and HTN.                                                                                                                                      Clinical Impression   Pt reports he was independent with ADL PTA. Currently pt is overall min assist with ADL and functional mobility with the exception of max assist for LB ADL. Educated pt and wife on posterior hip precautions and maintaining during ADL. Pt and wife expressing concerns about returning home directly upon d/c; were asking about post acute rehab (SNF). At this time, feel pt can progress to return home with [facility] for follow up in order to maximize independence and safety with ADL and functional mobility. Pt would benefit from continued skilled OT to address established goals.    Follow Up Recommendations  Home health OT;Supervision/Assistance - 24 hour    Equipment Recommendations  3 in 1 bedside comode;Other (comment) (AE)    Recommendations for Other Services       Precautions / Restrictions Precautions Precautions: Posterior Hip;Fall Precaution Comments: Educated pt and wife on precautions Required Braces or Orthoses: Knee Immobilizer - Right Knee Immobilizer - Right: On at all times Restrictions Weight Bearing Restrictions: Yes RLE Weight Bearing: Weight bearing as tolerated      Mobility Bed Mobility Overal bed mobility: Needs Assistance Bed Mobility: Supine to Sit     Supine to sit: Min guard    General bed mobility comments: Min guard for RLE. Pt able to perform bed mobility with use of bed rails.  Transfers Overall transfer level: Needs assistance Equipment used: Rolling walker (2 wheeled) Transfers: Sit to/from Stand Sit to  Stand: Min assist         General transfer comment: Min assist to boost up from EOB. Min assist provided in standing and for mobility due to balance.    Balance Overall balance assessment: Needs assistance Sitting-balance support: Single extremity supported;Feet supported Sitting balance-Leahy Scale: Fair     Standing balance support: Bilateral upper extremity supported Standing balance-Leahy Scale: Poor Standing balance comment: RW for support                            ADL Overall ADL's : Needs assistance/impaired Eating/Feeding: Set up;Sitting   Grooming: Min guard;Sitting   Upper Body Bathing: Min guard;Sitting   Lower Body Bathing: Maximal assistance;Sit to/from stand   Upper Body Dressing : Min guard;Sitting Upper Body Dressing Details (indicate cue type and reason): to don hospital gown Lower Body Dressing: Maximal assistance;Sit to/from stand Lower Body Dressing Details (indicate cue type and reason): to  don [place] Toilet Transfer: Minimal assistance;Ambulation;BSC;RW Toilet Transfer Details (indicate cue type and reason): Simulated by sit to stand from EOB and short distance functional mobility in room.         Functional mobility during ADLs: Minimal assistance;Rolling walker General ADL Comments: Educated pt and wife on posterior hip precautions and maintaining during ADL. Pt and wife concerned about goign directly home from the hospital; think pt may need further rehab. Encouraged pt to consider home with [facility] therapies. Supplemental O2 removed prior to mobility, SpO2=94 on RA, 97 on RA following activity. Left supplemental O2 off; RN notified.     Vision Vision Assessment?: No apparent visual deficits   Perception     Praxis      Pertinent Vitals/Pain Pain Assessment: 0-10 Pain Score: 7  Pain Location: R hip Pain Descriptors / Indicators: Aching;Spasm;Operative site guarding Pain Intervention(s): Monitored during session;Repositioned;Ice applied      Hand Dominance     Extremity/Trunk Assessment Upper Extremity Assessment Upper Extremity Assessment: Overall WFL for tasks assessed   Lower Extremity Assessment Lower Extremity Assessment: Defer to PT evaluation RLE Deficits / Details: Pt with decreased strength and ROM limitations secondary to post-op.   Cervical / Trunk Assessment Cervical / Trunk Assessment: Normal   Communication Communication Communication: HOH   Cognition Arousal/Alertness: Awake/alert Behavior During Therapy: WFL for tasks assessed/performed Overall Cognitive Status: Within Functional Limits for tasks assessed       Memory: Decreased recall of precautions             General Comments       Exercises Exercises: Total Joint     Shoulder Instructions      Home Living Family/patient expects to be discharged to:: Private residence Living Arrangements: Spouse/significant other Available Help at Discharge: Family;Available 24 hours/day Type of Home: House Home Access: Level entry     Home Layout: One level     Bathroom Shower/Tub: Occupational psychologist: Handicapped height Bathroom Accessibility: Yes How Accessible: Accessible via walker Home Equipment: Shower seat - built in          Prior Functioning/Environment Level of Independence: Independent             OT Diagnosis: Generalized weakness;Acute pain   OT Problem List: Decreased strength;Decreased range of motion;Impaired balance (sitting and/or standing);Decreased knowledge of use of DME or AE;Decreased knowledge of precautions;Decreased safety awareness;Pain   OT Treatment/Interventions: Self-care/ADL training;Energy conservation;DME and/or AE instruction;Therapeutic activities;Patient/family education;Balance training    OT Goals(Current goals can be found in the care plan section) Acute Rehab OT Goals Patient Stated Goal: rehab before home OT Goal Formulation: With patient/family Time For Goal  Achievement: 08/13/16 Potential to Achieve Goals: Good ADL Goals Pt Will Perform Lower Body Bathing: with supervision;sit to/from stand;with adaptive equipment Pt Will Perform Lower Body Dressing: with supervision;with adaptive equipment;sit to/from stand Pt Will Transfer to Toilet: with supervision;ambulating;bedside commode Pt Will Perform Toileting - Clothing Manipulation and hygiene: with supervision;sit to/from stand Pt Will Perform Tub/Shower Transfer: Shower transfer;with supervision;ambulating;shower seat;rolling walker Additional ADL Goal #1: Pt will independently verbally recall 3/3 posterior hip precautions and maintain throughout ADL.  OT Frequency: Min 2X/week   Barriers to D/C:            Co-evaluation              End of Session Equipment Utilized During Treatment: Gait belt;Rolling walker;Right knee immobilizer Nurse Communication: Mobility status;Other (comment);Precautions (left supplemental O2 off)  Activity Tolerance: Patient tolerated treatment  well Patient left: in chair;with call bell/phone within reach;with family/visitor present   Time: [id number] OT Time Calculation (min): 30 min Charges:  OT General Charges $OT Visit: 1 Procedure OT Evaluation $OT Eval Moderate Complexity: 1 Procedure OT Treatments $Self Care/Home Management : 8-22 mins G-Codes:     Binnie Kand M.S., OTR/L Pager: [phone]  07/30/2016, 11:46 AM

## 2016-07-30 NOTE — Progress Notes (Signed)
Patient Profile: 82 year old Caucasian male who suffered a myocardial infarction 17 years ago at which time he underwent stenting (question vessel), has known mild carotid disease, hypertension, hyperlipidemia, and is felt to have ischemic cardiomyopathy with an ejection fraction in the 40% range. Admitted for right hip replacement. Post op course complicated by agitation and hypotension. Approx 1100-1300 cc blood loss during surgery. BP responded well to transfusion of PRBC x 1 unit and Neo-Synephrine.   Subjective: Feels much better. Just walked with physical therapy. Doing well. No dizziness, syncope/ near syncope, CP nor dyspnea.   Objective: Vital signs in last 24 hours: Temp:  [97 F (36.1 C)-98.5 F (36.9 C)] 97.9 F (36.6 C) (08/02 0400) Pulse Rate:  [69-91] 81 (08/02 0400) Resp:  [8-26] 16 (08/02 0400) BP: (63-133)/(43-81) 117/66 (08/02 0400) SpO2:  [81 %-100 %] 99 % (08/02 0400) Weight:  [262 lb 14.4 oz (119.3 kg)] 262 lb 14.4 oz (119.3 kg) (08/02 0400) Last BM Date: 07/28/16  Intake/Output from previous day: 08/01 0701 - 08/02 0700 In: 3922.5 [P.O.:520; I.V.:2587.5; Blood:365; IV Piggyback:450] Out: [id number] [Urine:250; Blood:1300] Intake/Output this shift: No intake/output data recorded.  Medications Current Facility-Administered Medications  Medication Dose Route Frequency Provider Last Rate Last Dose  . alum & mag hydroxide-simeth (MAALOX/MYLANTA) 200-200-20 MG/5ML suspension 30 mL  30 mL Oral Q4H PRN Cherylann Ratel, PA-C      . amitriptyline (ELAVIL) tablet 50 mg  50 mg Oral QHS Mike Craze Petrarca, PA-C   50 mg at 07/29/16 2213  . bisacodyl (DULCOLAX) suppository 10 mg  10 mg Rectal Daily PRN Cherylann Ratel, PA-C      . diphenhydrAMINE (BENADRYL) 12.5 MG/5ML elixir 12.5-25 mg  12.5-25 mg Oral Q4H PRN Mike Craze Petrarca, PA-C      . docusate sodium (COLACE) capsule 100 mg  100 mg Oral BID Mike Craze Petrarca, PA-C   100 mg at 07/30/16 0941  . HYDROcodone-acetaminophen  (NORCO/VICODIN) 5-325 MG per tablet 1-2 tablet  1-2 tablet Oral Q4H PRN Cherylann Ratel, PA-C   1 tablet at 07/30/16 1243  . HYDROmorphone (DILAUDID) injection 0.5 mg  0.5 mg Intravenous Q2H PRN Mike Craze Petrarca, PA-C      . magnesium citrate solution 1 Bottle  1 Bottle Oral Once PRN Cherylann Ratel, PA-C      . menthol-cetylpyridinium (CEPACOL) lozenge 3 mg  1 lozenge Oral PRN Cherylann Ratel, PA-C       Or  . phenol (CHLORASEPTIC) mouth spray 1 spray  1 spray Mouth/Throat PRN Cherylann Ratel, PA-C      . methocarbamol (ROBAXIN) tablet 500 mg  500 mg Oral Q6H PRN Cherylann Ratel, PA-C   500 mg at 07/30/16 0315   Or  . methocarbamol (ROBAXIN) 500 mg in dextrose 5 % 50 mL IVPB  500 mg Intravenous Q6H PRN Cherylann Ratel, PA-C      . metoCLOPramide (REGLAN) tablet 5-10 mg  5-10 mg Oral Q8H PRN Cherylann Ratel, PA-C       Or  . metoCLOPramide (REGLAN) injection 5-10 mg  5-10 mg Intravenous Q8H PRN Mike Craze Petrarca, PA-C      . ondansetron (ZOFRAN) tablet 4 mg  4 mg Oral Q6H PRN Cherylann Ratel, PA-C       Or  . ondansetron (ZOFRAN) injection 4 mg  4 mg Intravenous Q6H PRN Mike Craze Petrarca, PA-C      . polyethylene glycol (MIRALAX / GLYCOLAX) packet 17 g  17 g  Oral Daily PRN Cherylann Ratel, PA-C      . rivaroxaban (XARELTO) tablet 10 mg  10 mg Oral Q breakfast Cherylann Ratel, PA-C   10 mg at 07/30/16 0802  . simvastatin (ZOCOR) tablet 20 mg  20 mg Oral QPM Mike Craze Petrarca, PA-C   20 mg at 07/29/16 2055    PE: General appearance: alert, cooperative and no distress Neck: no carotid bruit and no JVD Lungs: clear to auscultation bilaterally Heart: regular rate and rhythm, S1, S2 normal, 1/6 SM Extremities: no LEE Pulses: 2+ and symmetric Skin: warm and drym and dry Neurologic: grossly normal.  Lab Results:   Recent Labs  07/29/16 1353 07/29/16 1600 07/30/16 0537  WBC  --  18.2* 15.5*  HGB 12.6* 11.3* 10.9*  HCT 37.0* 34.3* 31.8*  PLT  --  198 155   BMET  Recent Labs   07/29/16 1353 07/29/16 1848 07/30/16 0537  NA 141 136 134*  K 4.5 4.5 4.1  CL  --  105 103  CO2  --  25 23  GLUCOSE 146* 150* 145*  BUN  --  19 22*  CREATININE  --  1.13 1.18  CALCIUM  --  8.6* 8.1*   Cardiac Panel (last 3 results)  Recent Labs  07/29/16 1840 07/29/16 2343 07/30/16 0537  TROPONINI <0.03 0.03* <0.03    Studies/Results: 2D Echo 07/30/16  Study Conclusions  - Left ventricle: Endocardial definition poor even with definity. ?   septal and apical hypokinesis   overall EF probably low normal Consider MRI to further assess EF   and RWMA;s   if clinically indicated. Wall thickness was increased in a   pattern of mild LVH. Left ventricular diastolic function   parameters were normal. - Aortic valve: There was trivial regurgitation. - Atrial septum: No defect or patent foramen ovale was identified.  Assessment/Plan  Active Problems:   Arteriosclerotic cardiovascular disease (ASCVD)   Essential hypertension   Cardiomyopathy, ischemic   Primary osteoarthritis of right hip   Hypotension   Chronic combined systolic and diastolic CHF (congestive heart failure) (HCC)   S/P total hip arthroplasty   Post-operative state   Postprocedural hypotension   1. Post Operative Hypotension: BP improved with transfusion of blood + Neo- synephrine, which has since been continued. Now normotensive, but soft BP in the upper 0000000, low [id number] systolic. He is asymptomatic and ambulating w/o difficulty, other than leg pain. Home losartan, lasix and metoprolol on hold. Troponin's cycled x 3 and unremarkable (<0.03, 0.03, <0.03). 2D echo of poor quality. Endocardial definition poor even with definity. ? septal and apical hypokinesis overall EF probably low normal. MD to review. His Hgb is stable at 10.9. Continue to monitor BP. If still soft, would continue to hold home antihypertensives at time of discharge. We can gradually resume meds in the outpatient setting as BP allows.   2. CAD:  stable w/o CP.   3. Ischemic Cardiomyopathy: volume stable. No dyspnea. BB, ARB and diuretic on hold given hypotension/ soft BP.   4. Right Hip OA: Day 1s/p THR. Management per ortho. On Xarelto for DVT prophylaxis.    LOS: 1 day    Brittainy M. Rosita Fire, PA-C 07/30/2016 2:25 PM ------------------------------------------------------------------- ECHO Study Conclusions  - Left ventricle: Endocardial definition poor even with definity. ?   septal and apical hypokinesis   overall EF probably low normal Consider MRI to further assess EF   and RWMA;s   if clinically indicated. Wall thickness was increased in  a   pattern of mild LVH. Left ventricular diastolic function   parameters were normal. - Aortic valve: There was trivial regurgitation. - Atrial septum: No defect or patent foramen ovale was identified.   Patient seen and examined. Agree with assessment and plan. No chest pain or dyspnea. Sinus rhythm on telemetry without ectopy. BP stable. Will re-assess tomorrow but stable cardiac status with mild WMA from prior MI and low nl LV Fxn. Possibly resume cardiac meds tomorrow at lower dose.   Troy Sine, MD, [facility] 07/30/2016 3:43 PM

## 2016-07-30 NOTE — Progress Notes (Signed)
Patient ID: Jimmy Knox, male   DOB: April 02, 1932, 82 y.o.   MRN: [id number] PATIENT ID: Jimmy Knox        MRN:  [id number]          DOB/AGE: Nov 09, 1932 / 82 y.o.    Jimmy Fears, MD   Biagio Borg, PA-C [address] [place], [place]  [address]                             [phone]   PROGRESS NOTE  Subjective:  negative for Chest Pain  negative for Shortness of Breath  negative for Nausea/Vomiting   negative for Calf Pain    Tolerating Diet: yes         Patient reports pain as mild.     Some right hip cramping this am otherwise comfortable  Objective: Vital signs in last 24 hours:   Patient Vitals for the past 24 hrs:  BP Temp Temp src Pulse Resp SpO2 Height Weight  07/30/16 0400 117/66 97.9 F (36.6 C) Oral 81 16 99 % - 119.3 kg (262 lb 14.4 oz)  07/30/16 0000 116/62 98.3 F (36.8 C) Oral 80 16 97 % - -  07/29/16 1901 133/74 98.5 F (36.9 C) - 73 16 100 % - -  07/29/16 1819 116/67 - - 69 16 100 % - -  07/29/16 1810 109/67 98.3 F (36.8 C) - - - - - -  07/29/16 1804 - 98.3 F (36.8 C) - - - - - -  07/29/16 1742 - - - 86 (!) 8 100 % - -  07/29/16 1737 - - - 83 20 100 % - -  07/29/16 1730 - - - 90 17 99 % - -  07/29/16 1719 119/68 - - - - - - -  07/29/16 1715 - - - 82 18 100 % - -  07/29/16 1706 116/81 - - 86 15 100 % - -  07/29/16 1642 - - - 84 20 98 % - -  07/29/16 1620 107/73 97.8 F (36.6 C) Oral 82 14 99 % - -  07/29/16 1615 - - - 86 (!) 21 98 % - -  07/29/16 1600 - - - 87 (!) 26 98 % - -  07/29/16 1559 (!) 95/57 97.6 F (36.4 C) Oral 88 20 100 % - -  07/29/16 1545 (!) 77/57 - - 81 17 98 % - -  07/29/16 1530 (!) 63/43 - - 89 20 (!) 81 % - -  07/29/16 1515 - 97 F (36.1 C) - 91 20 98 % - -  07/29/16 1012 121/65 98.5 F (36.9 C) Oral 72 20 100 % 6' (1.829 m) 95.8 kg (211 lb 3 oz)      Intake/Output from previous day:   08/01 0701 - 08/02 0700 In: 3922.5 [P.O.:520; I.V.:2587.5] Out: 1550 [Urine:250]   Intake/Output this shift:   No  intake/output data recorded.   Intake/Output      08/01 0701 - 08/02 0700 08/02 0701 - 08/03 0700   P.O. 520    I.V. (mL/kg) 2587.5 (21.7)    Blood 365    IV Piggyback 450    Total Intake(mL/kg) 3922.5 (32.9)    Urine (mL/kg/hr) 250    Blood 1300    Total Output 1550     Net +2372.5          Urine Occurrence 3 x       LABORATORY DATA:  Recent Labs  07/29/16 1353 07/29/16 1600 07/30/16 0537  WBC  --  18.2* 15.5*  HGB 12.6* 11.3* 10.9*  HCT 37.0* 34.3* 31.8*  PLT  --  198 155    Recent Labs  07/29/16 1353 07/29/16 1848 07/30/16 0537  NA 141 136 134*  K 4.5 4.5 4.1  CL  --  105 103  CO2  --  25 23  BUN  --  19 22*  CREATININE  --  1.13 1.18  GLUCOSE 146* 150* 145*  CALCIUM  --  8.6* 8.1*   Lab Results  Component Value Date   INR 1.03 07/21/2016    Recent Radiographic Studies :  Dg Chest 2 View  Result Date: 07/21/2016 CLINICAL DATA:  Preop for total hip arthroplasty. EXAM: CHEST  2 VIEW COMPARISON:  Chest x-ray 02/10/2013 FINDINGS: Aging cardiac silhouette, mediastinal and hilar contours are stable. Mild tortuosity of the thoracic aorta and slightly prominent mediastinal contours are stable. There are fairly extensive chronic scarring changes likely a combination of emphysema and chronic bronchitic change. No definite acute pulmonary findings or worrisome pulmonary lesions. The bony thorax is intact. IMPRESSION: Chronic pulmonary scarring changes without definite acute overlying pulmonary process. Electronically Signed   By: Marijo Sanes M.D.   On: 07/21/2016 14:27  Dg Hip Port Unilat With Pelvis 1v Right  Result Date: 07/29/2016 CLINICAL DATA:  82 year old male status post right hip replacement. Initial encounter. EXAM: DG HIP (WITH OR WITHOUT PELVIS) 1V PORT RIGHT COMPARISON:  None. FINDINGS: Portable AP and cross-table lateral views of the right hip and pelvis at 1614 hours. Right total hip arthroplasty sequelae noted. Hardware appears intact and normally  aligned. Overlying postoperative changes to the soft tissues. No unexpected osseous changes identified. Right femoral artery calcified atherosclerosis. IMPRESSION: Right total hip arthroplasty with no adverse features. Electronically Signed   By: Genevie Ann M.D.   On: 07/29/2016 17:09     Examination:  General appearance: alert, cooperative and no distress  Wound Exam: clean, dry, intact   Drainage:  None: wound tissue dry  Motor Exam: EHL, FHL, Anterior Tibial and Posterior Tibial Intact  Sensory Exam: Superficial Peroneal, Deep Peroneal and Tibial normal  Vascular Exam: Normal  Assessment:    1 Day Post-Op  Procedure(s) (LRB): TOTAL HIP ARTHROPLASTY (Right)  ADDITIONAL DIAGNOSIS:  Active Problems:   Arteriosclerotic cardiovascular disease (ASCVD)   Essential hypertension   Cardiomyopathy, ischemic   Primary osteoarthritis of right hip   Hypotension   Chronic combined systolic and diastolic CHF (congestive heart failure) (HCC)   S/P total hip arthroplasty   Post-operative state   Postprocedural hypotension  Acute Blood Loss Anemia and Hyponatremia-asymptomatic   Plan: Physical Therapy as ordered Weight Bearing as Tolerated (WBAT)  DVT Prophylaxis:  Xarelto, Foot Pumps and TED hose  DISCHARGE PLAN: Home  DISCHARGE NEEDS: HHPT, Walker and 3-in-1 comode seat   hypotensive episodes in OR and PACU resolved-cardiology eval without acute event-stable this am without SOB or chest pain, tolerating diet and voiding without difficulty. OOB with PT, lab OK this am-stable     Garald Balding  07/30/2016 7:57 AM

## 2016-07-30 NOTE — Progress Notes (Signed)
  Echocardiogram 2D Echocardiogram with Definity has been performed.  Tresa Res 07/30/2016, 10:49 AM

## 2016-07-30 NOTE — Evaluation (Signed)
Physical Therapy Evaluation Patient Details Name: Jimmy Knox MRN: [id number] DOB: 1932-02-18 Today's Date: 07/30/2016   History of Present Illness  Pt is an 82 y/o male s/p R THA, posterior precautions. PMH including but not limited to CHF, CAD, MI with stenting (2000), DM, and HTN.                                                                                                                                     Clinical Impression  Pt presented supine in bed with HOB elevated, awake and willing to participate in therapy session. Pt's nurse stated that transport was on the way to take the pt for an echocardiogram. She requested that he return to his bed at the end of the session. Pt was able to perform STS transfer x1 with min A to assist with maintaining his balance upon standing. Pt then performed therapeutic exercises while sitting EOB. At that time, transport had arrived to take the pt for his echo. Pt would continue to benefit from skilled physical therapy services at this time while admitted and after d/c to address his below listed limitations in order to improve his overall safety and independence with functional mobility. PT plan to perform gait training at next visit.      Follow Up Recommendations Home health PT;Supervision/Assistance - 24 hour    Equipment Recommendations  Rolling walker with 5" wheels;3in1 (PT)    Recommendations for Other Services       Precautions / Restrictions Precautions Precautions: Posterior Hip;Fall Restrictions Weight Bearing Restrictions: Yes RLE Weight Bearing: Weight bearing as tolerated      Mobility  Bed Mobility Overal bed mobility: Needs Assistance Bed Mobility: Supine to Sit;Sit to Supine     Supine to sit: Min assist;HOB elevated Sit to supine: Min assist   General bed mobility comments: Pt required increased time and min A with R LE  Transfers Overall transfer level: Needs assistance Equipment used: Rolling walker (2  wheeled) Transfers: Sit to/from Stand Sit to Stand: Min assist;From elevated surface         General transfer comment: Pt required increased time and min A for mild unsteadiness upon standing  Ambulation/Gait             General Gait Details: did not occur as transport arrived and needed to take pt for an echocardiogram  Stairs            Wheelchair Mobility    Modified Rankin (Stroke Patients Only)       Balance Overall balance assessment: Needs assistance Sitting-balance support: Bilateral upper extremity supported;Feet supported Sitting balance-Leahy Scale: Poor     Standing balance support: Bilateral upper extremity supported Standing balance-Leahy Scale: Poor                               Pertinent Vitals/Pain Pain Assessment: 0-10  Pain Score: 7  Pain Location: R hip Pain Descriptors / Indicators: Discomfort;Sore;Operative site guarding Pain Intervention(s): Monitored during session;Repositioned;Ice applied    Home Living Family/patient expects to be discharged to:: Private residence Living Arrangements: Spouse/significant other Available Help at Discharge: Family;Available 24 hours/day Type of Home: House Home Access: Level entry     Home Layout: One level Home Equipment: None      Prior Function Level of Independence: Independent               Hand Dominance        Extremity/Trunk Assessment   Upper Extremity Assessment: Defer to OT evaluation           Lower Extremity Assessment: RLE deficits/detail RLE Deficits / Details: Pt with decreased strength and ROM limitations secondary to post-op.       Communication   Communication: HOH  Cognition Arousal/Alertness: Awake/alert Behavior During Therapy: WFL for tasks assessed/performed Overall Cognitive Status: Within Functional Limits for tasks assessed                      General Comments      Exercises Total Joint Exercises Ankle  Circles/Pumps: AROM;Strengthening;Right;10 reps;Seated Long Arc Quad: AROM;Strengthening;Right;10 reps;Seated Marching in Standing: AROM;Strengthening;10 reps;Seated;Both      Assessment/Plan    PT Assessment Patient needs continued PT services  PT Diagnosis Difficulty walking   PT Problem List Decreased strength;Decreased range of motion;Decreased activity tolerance;Decreased balance;Decreased mobility;Decreased coordination;Decreased knowledge of use of DME;Pain  PT Treatment Interventions DME instruction;Gait training;Stair training;Functional mobility training;Therapeutic activities;Therapeutic exercise;Balance training;Patient/family education   PT Goals (Current goals can be found in the Care Plan section) Acute Rehab PT Goals Patient Stated Goal: return home PT Goal Formulation: With patient/family Time For Goal Achievement: 08/06/16 Potential to Achieve Goals: Good    Frequency 7X/week   Barriers to discharge Other (comment) Pt's wife expressed anxiety regarding pt returning home (planned d/c tomorrow per pt's wife report). She stated that she would be at home with pt 24/7, however, she did not feel comfortable giving him any physical assistance.    Co-evaluation               End of Session Equipment Utilized During Treatment: Gait belt;Oxygen Activity Tolerance: Patient limited by fatigue;Patient limited by pain Patient left: in bed;with call bell/phone within reach;with family/visitor present;Other (comment) (transport arrived at end of session) Nurse Communication: Mobility status         Time: [phone] PT Time Calculation (min) (ACUTE ONLY): 20 min   Charges:   PT Evaluation $PT Eval Moderate Complexity: 1 Procedure     PT G CodesClearnce Sorrel Yao [place] 07/30/2016, [place], PT, DPT [phone]

## 2016-07-30 NOTE — Progress Notes (Signed)
No return call back from cardiology. Paged covering physician for Dr. Durward Fortes.

## 2016-07-31 ENCOUNTER — Encounter (HOSPITAL_COMMUNITY): Payer: Self-pay | Admitting: General Practice

## 2016-07-31 DIAGNOSIS — I472 Ventricular tachycardia: Secondary | ICD-10-CM

## 2016-07-31 LAB — BASIC METABOLIC PANEL
ANION GAP: 6 (ref 5–15)
BUN: 20 mg/dL (ref 6–20)
CHLORIDE: 105 mmol/L (ref 101–111)
CO2: 25 mmol/L (ref 22–32)
Calcium: 8.3 mg/dL — ABNORMAL LOW (ref 8.9–10.3)
Creatinine, Ser: 1 mg/dL (ref 0.61–1.24)
GFR calc non Af Amer: >60 mL/min (ref >60)
Glucose, Bld: 125 mg/dL — ABNORMAL HIGH (ref 65–99)
Potassium: 4 mmol/L (ref 3.5–5.1)
Sodium: 136 mmol/L (ref 135–145)

## 2016-07-31 LAB — CBC
HEMATOCRIT: 30.5 % — AB (ref 39.0–52.0)
HEMOGLOBIN: 10.2 g/dL — AB (ref 13.0–17.0)
MCH: 30.7 pg (ref 26.0–34.0)
MCHC: 33.4 g/dL (ref 30.0–36.0)
MCV: 91.9 fL (ref 78.0–100.0)
Platelets: 143 10*3/uL — ABNORMAL LOW (ref 150–400)
RBC: 3.32 MIL/uL — AB (ref 4.22–5.81)
RDW: 13.3 % (ref 11.5–15.5)
WBC: 11.5 10*3/uL — AB (ref 4.0–10.5)

## 2016-07-31 MED ORDER — HYDROCODONE-ACETAMINOPHEN 5-325 MG PO TABS
1.0000 | ORAL_TABLET | ORAL | 0 refills | Status: DC | PRN
Start: 2016-07-31 — End: 2017-02-25

## 2016-07-31 MED ORDER — METOPROLOL TARTRATE 12.5 MG HALF TABLET
12.5000 mg | ORAL_TABLET | Freq: Two times a day (BID) | ORAL | Status: DC
  Administered 2016-07-31 – 2016-08-01 (×3): 12.5 mg via ORAL
  Filled 2016-07-31 (×3): qty 1

## 2016-07-31 MED ORDER — RIVAROXABAN 10 MG PO TABS: 10.0000 mg | ORAL_TABLET | Freq: Every day | ORAL | 0 refills | Status: DC

## 2016-07-31 MED ORDER — METHOCARBAMOL 500 MG PO TABS: 500.0000 mg | ORAL_TABLET | Freq: Three times a day (TID) | ORAL | 0 refills | Status: DC | PRN

## 2016-07-31 NOTE — Progress Notes (Signed)
Pt had 9-beat run of v-tach at 2300. Denies palpitations, chest pain, dizzinessor shortness of breath. Cardiology fellow paged and awaiting response.

## 2016-07-31 NOTE — Progress Notes (Signed)
Physical Therapy Treatment Patient Details Name: Jimmy Knox MRN: [id number] DOB: 1932-07-27 Today's Date: 07/31/2016    History of Present Illness Pt is an 82 y/o male s/p R THA, posterior precautions. PMH including but not limited to CHF, CAD, MI with stenting (2000), DM, and HTN.                                                                                                                                       PT Comments    Pt presented supine in bed with HOB elevated, awake and willing to participate in therapy session. Pt's granddaughter was present throughout session. Pt's beginning HR was 114 bpm, and HR at end of session was 107 bpm sitting upright in recliner. Pt reported no dizziness or SOB during session. Following ambulation, pt stated that he did not want to "overdo it" so therapeutic exercises were deferred until next session. Pt is progressing well towards achieving his goals. Pt would continue to benefit from skilled physical therapy services at this time while admitted and after d/c to address his limitations in order to improve his overall safety and independence with functional mobility.    Follow Up Recommendations  Home health PT;Supervision/Assistance - 24 hour     Equipment Recommendations  Rolling walker with 5" wheels;3in1 (PT)    Recommendations for Other Services       Precautions / Restrictions Precautions Precautions: Posterior Hip;Fall Precaution Booklet Issued: Yes (comment) Precaution Comments: Reviewed precautions throughout session with pt Required Braces or Orthoses: Knee Immobilizer - Right Knee Immobilizer - Right: Other (comment) (when in bed per MD's order) Restrictions Weight Bearing Restrictions: Yes RLE Weight Bearing: Weight bearing as tolerated    Mobility  Bed Mobility Overal bed mobility: Needs Assistance Bed Mobility: Supine to Sit     Supine to sit: Min assist;HOB elevated (with R LE)     General bed mobility comments:  pt required increased time and min A with R LE movement  Transfers Overall transfer level: Needs assistance Equipment used: Rolling walker (2 wheeled) Transfers: Sit to/from Stand Sit to Stand: Min guard         General transfer comment: pt required increased time to complete task and VC'ing for bilateral hand positioning  Ambulation/Gait Ambulation/Gait assistance: Min guard Ambulation Distance (Feet): 200 Feet Assistive device: Rolling walker (2 wheeled) Gait Pattern/deviations: Step-to pattern;Decreased step length - left;Decreased stance time - right;Decreased weight shift to right Gait velocity: decreased       Stairs            Wheelchair Mobility    Modified Rankin (Stroke Patients Only)       Balance Overall balance assessment: Needs assistance Sitting-balance support: Feet supported;No upper extremity supported Sitting balance-Leahy Scale: Fair     Standing balance support: Single extremity supported;During functional activity Standing balance-Leahy Scale: Poor  Cognition Arousal/Alertness: Awake/alert Behavior During Therapy: WFL for tasks assessed/performed Overall Cognitive Status: Within Functional Limits for tasks assessed                      Exercises      General Comments        Pertinent Vitals/Pain Pain Assessment: Faces Faces Pain Scale: No hurt Pain Intervention(s): Monitored during session    Home Living                      Prior Function            PT Goals (current goals can now be found in the care plan section) Acute Rehab PT Goals Patient Stated Goal: return home PT Goal Formulation: With patient/family Time For Goal Achievement: 08/06/16 Potential to Achieve Goals: Good Progress towards PT goals: Progressing toward goals    Frequency  7X/week    PT Plan Current plan remains appropriate    Co-evaluation             End of Session Equipment Utilized  During Treatment: Gait belt Activity Tolerance: Patient limited by fatigue Patient left: in chair;with call bell/phone within reach;with family/visitor present     Time: [id number] PT Time Calculation (min) (ACUTE ONLY): 24 min  Charges:  $Gait Training: 23-37 mins                    G CodesClearnce Sorrel Kelyn Koskela 08-26-2016, 10:50 AM Sherie Don, [place], DPT [phone]

## 2016-07-31 NOTE — Progress Notes (Signed)
Physical Therapy Treatment Patient Details Name: Jimmy Knox MRN: [id number] DOB: 06-07-1932 Today's Date: 07/31/2016    History of Present Illness Pt is an 82 y/o male s/p R THA, posterior precautions. PMH including but not limited to CHF, CAD, MI with stenting (2000), DM, and HTN.                                                                                                                                       PT Comments    Pt presented sitting EOB with OT finishing their session together. Pt willing to participate in PT session at this time. Pt successfully completed stair training during this session. After stair training and ambulation, pt stated that his R LE was becoming fatigued. Pt was positioned comfortably in recliner with call bell in reach and family present in room. Pt would continue to benefit from skilled physical therapy services at this time while admitted and after d/c to address his limitations in order to improve his overall safety and independence with functional mobility.    Follow Up Recommendations  Home health PT;Supervision/Assistance - 24 hour     Equipment Recommendations  Rolling walker with 5" wheels;3in1 (PT)    Recommendations for Other Services       Precautions / Restrictions Precautions Precautions: Posterior Hip;Fall Precaution Comments: Pt able to recall 3/3 precautions at beginning of session, and maintain during AE education and practice Required Braces or Orthoses: Knee Immobilizer - Right Knee Immobilizer - Right: Other (comment) (when in bed per MD's order) Restrictions Weight Bearing Restrictions: Yes RLE Weight Bearing: Weight bearing as tolerated    Mobility  Bed Mobility               General bed mobility comments: Pt sitting EOB when PT entered room  Transfers Overall transfer level: Needs assistance Equipment used: Rolling walker (2 wheeled) Transfers: Sit to/from Stand Sit to Stand: Min guard          General transfer comment: pt required increased time to complete task and VC'ing for bilateral hand positioning  Ambulation/Gait Ambulation/Gait assistance: Min guard Ambulation Distance (Feet): 50 Feet Assistive device: Rolling walker (2 wheeled) Gait Pattern/deviations: Step-through pattern;Decreased step length - left;Decreased stance time - right;Decreased weight shift to right Gait velocity: decreased Gait velocity interpretation: Below normal speed for age/gender     Stairs Stairs: Yes Stairs assistance: Min guard Stair Management: No rails;Backwards;With walker Number of Stairs: 1 General stair comments: PT demonstrated ascending and descending stairs with use of RW and pt able to successfully perform.  Wheelchair Mobility    Modified Rankin (Stroke Patients Only)       Balance Overall balance assessment: Needs assistance Sitting-balance support: Feet supported;No upper extremity supported Sitting balance-Leahy Scale: Fair     Standing balance support: Bilateral upper extremity supported;During functional activity Standing balance-Leahy Scale: Poor  Cognition Arousal/Alertness: Awake/alert Behavior During Therapy: WFL for tasks assessed/performed Overall Cognitive Status: Within Functional Limits for tasks assessed                      Exercises      General Comments        Pertinent Vitals/Pain Pain Assessment: No/denies pain Faces Pain Scale: No hurt Pain Intervention(s): Monitored during session    Home Living                      Prior Function            PT Goals (current goals can now be found in the care plan section) Acute Rehab PT Goals Patient Stated Goal: return home PT Goal Formulation: With patient/family Time For Goal Achievement: 08/06/16 Potential to Achieve Goals: Good Progress towards PT goals: Progressing toward goals    Frequency  7X/week    PT Plan Current plan remains  appropriate    Co-evaluation             End of Session Equipment Utilized During Treatment: Gait belt Activity Tolerance: Patient limited by fatigue Patient left: in chair;with call bell/phone within reach;with family/visitor present     Time: [id number] PT Time Calculation (min) (ACUTE ONLY): 28 min  Charges:  $Gait Training: 23-37 mins                    G CodesClearnce Sorrel [name] 08-14-16, 3:52 PM Jimmy Knox, [place], DPT [phone]

## 2016-07-31 NOTE — Progress Notes (Signed)
Occupational Therapy Treatment Patient Details Name: Jimmy Knox MRN: [id number] DOB: 07-07-1932 Today's Date: 07/31/2016    History of present illness Pt is an 82 y/o male s/p R THA, posterior precautions. PMH including but not limited to CHF, CAD, MI with stenting (2000), DM, and HTN.                                                                                                                                      OT comments  Pt, wife, and granddaughter educated and practiced with AE for ADL. Pt and family open and receptive to education of AE and sequence for safe toilet and shower transfer. Pt able to recall 3/3 precautions and maintain during ADL with AE. OT to continue to follow in the acute care setting.    Follow Up Recommendations  Home health OT;Supervision/Assistance - 24 hour    Equipment Recommendations  3 in 1 bedside comode;Other (comment) (AE)    Recommendations for Other Services      Precautions / Restrictions Precautions Precautions: Posterior Hip;Fall Precaution Comments: Pt able to recall 3/3 precautions at beginning of session, and maintain during AE education and practice Required Braces or Orthoses: Knee Immobilizer - Right Knee Immobilizer - Right: Other (comment) (when in bed per MD) Restrictions Weight Bearing Restrictions: Yes RLE Weight Bearing: Weight bearing as tolerated       Mobility Bed Mobility               General bed mobility comments: Pt sitting EOB when OT entered room  Transfers                      Balance                                   ADL Overall ADL's : Needs assistance/impaired     Grooming: Min guard;Sitting       Lower Body Bathing: Minimal assistance;Sit to/from stand;With adaptive equipment;With caregiver independent assisting Lower Body Bathing Details (indicate cue type and reason): Pt educated and practiced with long handle sponge and maintained precautions     Lower Body  Dressing: Minimal assistance;With adaptive equipment;Adhering to hip precautions;Sit to/from stand Lower Body Dressing Details (indicate cue type and reason): used AE grabber and sock donner to practice LB dressing     Toileting- Clothing Manipulation and Hygiene: With adaptive equipment;Min guard;Cueing for sequencing;Cueing for compensatory techniques;Adhering to hip precautions;Sit to/from stand;With caregiver independent assisting Toileting - Clothing Manipulation Details (indicate cue type and reason): Pt practiced use of tongs, and Pt advised to purchase flushable wipes to assist with hygeine       General ADL Comments: Reviewed precautions with Pt, wife, and granddaughter prior to starting education with AE kit. Pt practiced using all of the AE demonstrating ability to maintain precautions during ADL and  avoid breaking precautions      Vision                     Perception     Praxis      Cognition   Behavior During Therapy: WFL for tasks assessed/performed Overall Cognitive Status: Within Functional Limits for tasks assessed                       Extremity/Trunk Assessment               Exercises     Shoulder Instructions       General Comments      Pertinent Vitals/ Pain       Pain Assessment: Faces Faces Pain Scale: No hurt Pain Intervention(s): Monitored during session  Home Living                                          Prior Functioning/Environment              Frequency Min 2X/week     Progress Toward Goals  OT Goals(current goals can now be found in the care plan section)  Progress towards OT goals: Progressing toward goals  Acute Rehab OT Goals Patient Stated Goal: return home and watch his girls play tennis OT Goal Formulation: With patient/family Time For Goal Achievement: 08/13/16 Potential to Achieve Goals: Good ADL Goals Pt Will Perform Lower Body Bathing: with supervision;sit to/from  stand;with adaptive equipment Pt Will Perform Lower Body Dressing: with supervision;with adaptive equipment;sit to/from stand Pt Will Perform Toileting - Clothing Manipulation and hygiene: with supervision;sit to/from stand Additional ADL Goal #1: Pt will independently verbally recall 3/3 posterior hip precautions and maintain throughout ADL.  Plan Discharge plan remains appropriate    Co-evaluation                 End of Session     Activity Tolerance Patient tolerated treatment well   Patient Left in bed;with family/visitor present;Other (comment) (starting PT session)   Nurse Communication Mobility status;Other (comment);Precautions        Time: [id number] OT Time Calculation (min): 45 min  Charges: OT General Charges $OT Visit: 1 Procedure OT Treatments $Self Care/Home Management : 23-37 mins $Therapeutic Activity: 8-22 mins  Jimmy Knox OTR/L 07/31/2016, 3:50 PM [phone]

## 2016-07-31 NOTE — Progress Notes (Addendum)
Patient ID: Jimmy Knox, male   DOB: 1932/03/12, 82 y.o.   MRN: [id number] PATIENT ID: Jimmy Knox        MRN:  [id number]          DOB/AGE: 05-Dec-1932 / 82 y.o.    [name], MD   Biagio Borg, PA-C [address] [place], [place]  [address]                             [phone]   PROGRESS NOTE  Subjective:  negative for Chest Pain  negative for Shortness of Breath  negative for Nausea/Vomiting   negative for Calf Pain    Tolerating Diet: yes         Patient reports pain as mild and moderate.       Objective: Vital signs in last 24 hours:   Patient Vitals for the past 24 hrs:  BP Temp Temp src Pulse Resp SpO2 Weight  07/31/16 1206 127/63 97.8 F (36.6 C) Oral 96 18 100 % -  07/31/16 0555 (!) 99/55 97.6 F (36.4 C) Oral 80 18 94 % -  07/31/16 0550 - - - - - - 125.5 kg (276 lb 11.2 oz)  07/30/16 1943 (!) 107/51 98.1 F (36.7 C) Oral 85 18 98 % -  07/30/16 1400 (!) 99/56 97.4 F (36.3 C) - 83 18 100 % -      Intake/Output from previous day:   08/02 0701 - 08/03 0700 In: 720 [P.O.:720] Out: 200 [Urine:200]   Intake/Output this shift:   08/03 0701 - 08/03 1900 In: 240 [P.O.:240] Out: -    Intake/Output      08/02 0701 - 08/03 0700 08/03 0701 - 08/04 0700   P.O. 720 240   I.V. (mL/kg)     Blood     IV Piggyback     Total Intake(mL/kg) 720 (5.7) 240 (1.9)   Urine (mL/kg/hr) 200 (0.1)    Blood     Total Output 200     Net +520 +240        Urine Occurrence 3 x       LABORATORY DATA:  Recent Labs  07/29/16 1353 07/29/16 1600 07/30/16 0537 07/31/16 0500  WBC  --  18.2* 15.5* 11.5*  HGB 12.6* 11.3* 10.9* 10.2*  HCT 37.0* 34.3* 31.8* 30.5*  PLT  --  198 155 143*    Recent Labs  07/29/16 1353 07/29/16 1848 07/30/16 0537 07/31/16 0500  NA 141 136 134* 136  K 4.5 4.5 4.1 4.0  CL  --  105 103 105  CO2  --  25 23 25   BUN  --  19 22* 20  CREATININE  --  1.13 1.18 1.00  GLUCOSE 146* 150* 145* 125*  CALCIUM  --  8.6* 8.1* 8.3*     Lab Results  Component Value Date   INR 1.03 07/21/2016    Recent Radiographic Studies :  Dg Chest 2 View  Result Date: 07/21/2016 CLINICAL DATA:  Preop for total hip arthroplasty. EXAM: CHEST  2 VIEW COMPARISON:  Chest x-ray 02/10/2013 FINDINGS: Aging cardiac silhouette, mediastinal and hilar contours are stable. Mild tortuosity of the thoracic aorta and slightly prominent mediastinal contours are stable. There are fairly extensive chronic scarring changes likely a combination of emphysema and chronic bronchitic change. No definite acute pulmonary findings or worrisome pulmonary lesions. The bony thorax is intact. IMPRESSION: Chronic pulmonary scarring changes without definite acute overlying  pulmonary process. Electronically Signed   By: Marijo Sanes M.D.   On: 07/21/2016 14:27  Dg Hip Port Unilat With Pelvis 1v Right  Result Date: 07/29/2016 CLINICAL DATA:  82 year old male status post right hip replacement. Initial encounter. EXAM: DG HIP (WITH OR WITHOUT PELVIS) 1V PORT RIGHT COMPARISON:  None. FINDINGS: Portable AP and cross-table lateral views of the right hip and pelvis at 1614 hours. Right total hip arthroplasty sequelae noted. Hardware appears intact and normally aligned. Overlying postoperative changes to the soft tissues. No unexpected osseous changes identified. Right femoral artery calcified atherosclerosis. IMPRESSION: Right total hip arthroplasty with no adverse features. Electronically Signed   By: Genevie Ann M.D.   On: 07/29/2016 17:09     Examination:  General appearance: alert, cooperative, mild distress and moderate distress  Wound Exam: clean, bloody drainage  Drainage:  Scant/small amount Serosanguinous exudate  Motor Exam: EHL, FHL, Anterior Tibial and Posterior Tibial Intact  Sensory Exam: Superficial Peroneal, Deep Peroneal and Tibial normal  Vascular Exam: Right posterior tibial artery has trace pulse  Assessment:    2 Days Post-Op  Procedure(s)  (LRB): TOTAL HIP ARTHROPLASTY (Right)  ADDITIONAL DIAGNOSIS:  Active Problems:   Arteriosclerotic cardiovascular disease (ASCVD)   Essential hypertension   Cardiomyopathy, ischemic   Primary osteoarthritis of right hip   Hypotension   Chronic combined systolic and diastolic CHF (congestive heart failure) (HCC)   S/P total hip arthroplasty   Post-operative state   Postprocedural hypotension  Acute Blood Loss Anemia   Plan: Physical Therapy as ordered Weight Bearing as Tolerated (WBAT)  DVT Prophylaxis:  Xarelto, Foot Pumps and TED hose  DISCHARGE PLAN: Home  DISCHARGE NEEDS: HHPT, Walker and 3-in-1 comode seat   Spoke with Dr Claiborne Billings.  He would like to keep him one more day and monitor him        [facility]  07/31/2016 1:27 PM

## 2016-07-31 NOTE — Progress Notes (Signed)
Patient Profile: 82 year old Caucasian male who suffered a myocardial infarction 17 years ago at which time he underwent stenting (question vessel), has known mild carotid disease, hypertension, hyperlipidemia, and is felt to have ischemic cardiomyopathy with an ejection fraction in the 40% range. Admitted for right hip replacement. Post op course complicated by agitation and hypotension. Approx 1100-1300 cc blood loss during surgery. BP responded well to transfusion of PRBC x 1 unit and Neo-Synephrine.   Subjective: Feels much better. Just walked with physical therapy. Doing well. No dizziness, syncope/ near syncope, CP nor dyspnea.   Objective: Vital signs in last 24 hours: Temp:  [97.4 F (36.3 C)-98.1 F (36.7 C)] 97.6 F (36.4 C) (08/03 0555) Pulse Rate:  [80-85] 80 (08/03 0555) Resp:  [18] 18 (08/03 0555) BP: (99-107)/(51-56) 99/55 (08/03 0555) SpO2:  [94 %-100 %] 94 % (08/03 0555) Weight:  [276 lb 11.2 oz (125.5 kg)] 276 lb 11.2 oz (125.5 kg) (08/03 0550) Last BM Date: 07/28/16  Intake/Output from previous day: 08/02 0701 - 08/03 0700 In: 720 [P.O.:720] Out: 200 [Urine:200] Intake/Output this shift: Total I/O In: 240 [P.O.:240] Out: -   Medications Current Facility-Administered Medications  Medication Dose Route Frequency Provider Last Rate Last Dose  . alum & mag hydroxide-simeth (MAALOX/MYLANTA) 200-200-20 MG/5ML suspension 30 mL  30 mL Oral Q4H PRN Cherylann Ratel, PA-C      . amitriptyline (ELAVIL) tablet 50 mg  50 mg Oral QHS Mike Craze Petrarca, PA-C   50 mg at 07/30/16 2150  . bisacodyl (DULCOLAX) suppository 10 mg  10 mg Rectal Daily PRN Cherylann Ratel, PA-C      . diphenhydrAMINE (BENADRYL) 12.5 MG/5ML elixir 12.5-25 mg  12.5-25 mg Oral Q4H PRN Mike Craze Petrarca, PA-C      . docusate sodium (COLACE) capsule 100 mg  100 mg Oral BID Mike Craze Petrarca, PA-C   100 mg at 07/31/16 1035  . HYDROcodone-acetaminophen (NORCO/VICODIN) 5-325 MG per tablet 1-2 tablet  1-2  tablet Oral Q4H PRN Cherylann Ratel, PA-C   2 tablet at 07/31/16 0330  . HYDROmorphone (DILAUDID) injection 0.5 mg  0.5 mg Intravenous Q2H PRN Mike Craze Petrarca, PA-C      . magnesium citrate solution 1 Bottle  1 Bottle Oral Once PRN Cherylann Ratel, PA-C      . menthol-cetylpyridinium (CEPACOL) lozenge 3 mg  1 lozenge Oral PRN Cherylann Ratel, PA-C       Or  . phenol (CHLORASEPTIC) mouth spray 1 spray  1 spray Mouth/Throat PRN Cherylann Ratel, PA-C      . methocarbamol (ROBAXIN) tablet 500 mg  500 mg Oral Q6H PRN Cherylann Ratel, PA-C   500 mg at 07/31/16 [id number]   Or  . methocarbamol (ROBAXIN) 500 mg in dextrose 5 % 50 mL IVPB  500 mg Intravenous Q6H PRN Cherylann Ratel, PA-C      . metoCLOPramide (REGLAN) tablet 5-10 mg  5-10 mg Oral Q8H PRN Cherylann Ratel, PA-C       Or  . metoCLOPramide (REGLAN) injection 5-10 mg  5-10 mg Intravenous Q8H PRN Mike Craze Petrarca, PA-C      . ondansetron (ZOFRAN) tablet 4 mg  4 mg Oral Q6H PRN Cherylann Ratel, PA-C       Or  . ondansetron (ZOFRAN) injection 4 mg  4 mg Intravenous Q6H PRN Mike Craze Petrarca, PA-C      . polyethylene glycol (MIRALAX / GLYCOLAX) packet 17 g  17 g Oral  Daily PRN Cherylann Ratel, PA-C      . rivaroxaban Alveda Reasons) tablet 10 mg  10 mg Oral Q breakfast Cherylann Ratel, PA-C   10 mg at 07/31/16 1035  . simvastatin (ZOCOR) tablet 20 mg  20 mg Oral QPM Mike Craze Petrarca, PA-C   20 mg at 07/30/16 1709    PE: General appearance: alert, cooperative and no distress Neck: no carotid bruit and no JVD Lungs: clear to auscultation bilaterally Heart: regular rate and rhythm, S1, S2 normal, 1/6 SM Extremities: no LEE Pulses: 2+ and symmetric Skin: warm and drym and dry Neurologic: grossly normal.  Lab Results:   Recent Labs  07/29/16 1600 07/30/16 0537 07/31/16 0500  WBC 18.2* 15.5* 11.5*  HGB 11.3* 10.9* 10.2*  HCT 34.3* 31.8* 30.5*  PLT 198 155 143*   BMET  Recent Labs  07/29/16 1848 07/30/16 0537 07/31/16 0500  NA  136 134* 136  K 4.5 4.1 4.0  CL 105 103 105  CO2 25 23 25   GLUCOSE 150* 145* 125*  BUN 19 22* 20  CREATININE 1.13 1.18 1.00  CALCIUM 8.6* 8.1* 8.3*   Cardiac Panel (last 3 results)  Recent Labs  07/29/16 1840 07/29/16 2343 07/30/16 0537  TROPONINI <0.03 0.03* <0.03    Studies/Results: 2D Echo 07/30/16  Study Conclusions  - Left ventricle: Endocardial definition poor even with definity. ?   septal and apical hypokinesis   overall EF probably low normal Consider MRI to further assess EF   and RWMA;s   if clinically indicated. Wall thickness was increased in a   pattern of mild LVH. Left ventricular diastolic function   parameters were normal. - Aortic valve: There was trivial regurgitation. - Atrial septum: No defect or patent foramen ovale was identified.  Assessment/Plan  Active Problems:   Arteriosclerotic cardiovascular disease (ASCVD)   Essential hypertension   Cardiomyopathy, ischemic   Primary osteoarthritis of right hip   Hypotension   Chronic combined systolic and diastolic CHF (congestive heart failure) (HCC)   S/P total hip arthroplasty   Post-operative state   Postprocedural hypotension   1. Post Operative Hypotension: BP improved with transfusion of blood + Neo- synephrine, which has since been continued. Now normotensive, but soft BP in the upper 0000000, low [id number] systolic. He is asymptomatic and ambulating w/o difficulty, other than leg pain. Home losartan, lasix and metoprolol on hold. Troponin's cycled x 3 and unremarkable (<0.03, 0.03, <0.03). 2D echo of poor quality. Endocardial definition poor even with definity. ? septal and apical hypokinesis overall EF probably low normal. MD to review. His Hgb is stable at 10.9. Continue to monitor BP  2. CAD: stable w/o CP.   3. Ischemic Cardiomyopathy: volume stable. No dyspnea.  4. Right Hip OA: Day 1s/p THR. Management per ortho. On Xarelto for DVT prophylaxis.    No chest pain or dyspnea. Telemetry strip  reviewed. Pt had a brief 9 beat episode of WCT at ~ 5 am today.  BP 99 -107 range earlier today.  With short burst of probable NSVT will resume low dose BB with metoprolol tartrate 12.5 mg bid today (hold if BP< 100) and if tolarates resume metoprolol succinate home dose in place of shorter acting version tomorrow. Pt was also on losartan but may need to resume as an outpatient.   Troy Sine, MD, [facility] 07/31/2016 10:51 AM

## 2016-07-31 NOTE — Progress Notes (Signed)
   07/31/16 1620  Clinical Encounter Type  Visited With Patient  Visit Type Initial  Spiritual Encounters  Spiritual Needs Prayer  On afternoon rounds Chaplain visited with patient.  Patient up and alert and telling a joke.  Chaplain and patient had a lovely conversation.  Chaplain prayed for patient.  Chaplain made further support available if needed.

## 2016-07-31 NOTE — Care Management Important Message (Signed)
Important Message  Patient Details  Name: Jimmy Knox MRN: [id number] Date of Birth: 02/18/1932   Medicare Important Message Given:  Yes    [name] 07/31/2016, 10:55 AM

## 2016-08-01 LAB — TYPE AND SCREEN
ABO/RH(D): O POS
Antibody Screen: NEGATIVE
UNIT DIVISION: 0
Unit division: 0

## 2016-08-01 LAB — CBC
HCT: 31.7 % — ABNORMAL LOW (ref 39.0–52.0)
Hemoglobin: 10.5 g/dL — ABNORMAL LOW (ref 13.0–17.0)
MCH: 31.2 pg (ref 26.0–34.0)
MCHC: 33.1 g/dL (ref 30.0–36.0)
MCV: 94.1 fL (ref 78.0–100.0)
PLATELETS: 179 10*3/uL (ref 150–400)
RBC: 3.37 MIL/uL — AB (ref 4.22–5.81)
RDW: 13.3 % (ref 11.5–15.5)
WBC: 12.6 10*3/uL — ABNORMAL HIGH (ref 4.0–10.5)

## 2016-08-01 LAB — BASIC METABOLIC PANEL
Anion gap: 8 (ref 5–15)
BUN: 16 mg/dL (ref 6–20)
CALCIUM: 8.6 mg/dL — AB (ref 8.9–10.3)
CO2: 25 mmol/L (ref 22–32)
CREATININE: 1.04 mg/dL (ref 0.61–1.24)
Chloride: 104 mmol/L (ref 101–111)
GFR calc Af Amer: >60 mL/min (ref >60)
GLUCOSE: 125 mg/dL — AB (ref 65–99)
Potassium: 3.7 mmol/L (ref 3.5–5.1)
SODIUM: 137 mmol/L (ref 135–145)

## 2016-08-01 NOTE — Progress Notes (Signed)
Patient ID: Jimmy Knox, male   DOB: 08-04-1932, 82 y.o.   MRN: [id number] PATIENT ID: Jimmy Knox        MRN:  [id number]          DOB/AGE: 11/06/1932 / 82 y.o.    [name], MD   Biagio Borg, PA-C [address] [place], [place]  [address]                             [phone]   PROGRESS NOTE  Subjective:  negative for Chest Pain  negative for Shortness of Breath  negative for Nausea/Vomiting   negative for Calf Pain    Tolerating Diet: yes         Patient reports pain as mild.     Sitting up in chair eating breakfast without complaints  Objective: Vital signs in last 24 hours:   Patient Vitals for the past 24 hrs:  BP Temp Temp src Pulse Resp SpO2 Weight  08/01/16 0625 114/72 98 F (36.7 C) Oral 85 16 98 % -  08/01/16 0500 - - - - - - 125.5 kg (276 lb 10.8 oz)  07/31/16 1943 103/63 98.4 F (36.9 C) Oral 88 17 99 % -  07/31/16 1206 127/63 97.8 F (36.6 C) Oral 96 18 100 % -      Intake/Output from previous day:   08/03 0701 - 08/04 0700 In: 240 [P.O.:240] Out: 240 [Urine:240]   Intake/Output this shift:   No intake/output data recorded.   Intake/Output      08/03 0701 - 08/04 0700 08/04 0701 - 08/05 0700   P.O. 240    Total Intake(mL/kg) 240 (1.9)    Urine (mL/kg/hr) 240 (0.1)    Total Output 240     Net 0             LABORATORY DATA:  Recent Labs  07/29/16 1353 07/29/16 1600 07/30/16 0537 07/31/16 0500 08/01/16 0329  WBC  --  18.2* 15.5* 11.5* 12.6*  HGB 12.6* 11.3* 10.9* 10.2* 10.5*  HCT 37.0* 34.3* 31.8* 30.5* 31.7*  PLT  --  198 155 143* 179    Recent Labs  07/29/16 1353 07/29/16 1848 07/30/16 0537 07/31/16 0500 08/01/16 0329  NA 141 136 134* 136 137  K 4.5 4.5 4.1 4.0 3.7  CL  --  105 103 105 104  CO2  --  25 23 25 25   BUN  --  19 22* 20 16  CREATININE  --  1.13 1.18 1.00 1.04  GLUCOSE 146* 150* 145* 125* 125*  CALCIUM  --  8.6* 8.1* 8.3* 8.6*   Lab Results  Component Value Date   INR 1.03 07/21/2016     Recent Radiographic Studies :  Dg Chest 2 View  Result Date: 07/21/2016 CLINICAL DATA:  Preop for total hip arthroplasty. EXAM: CHEST  2 VIEW COMPARISON:  Chest x-ray 02/10/2013 FINDINGS: Aging cardiac silhouette, mediastinal and hilar contours are stable. Mild tortuosity of the thoracic aorta and slightly prominent mediastinal contours are stable. There are fairly extensive chronic scarring changes likely a combination of emphysema and chronic bronchitic change. No definite acute pulmonary findings or worrisome pulmonary lesions. The bony thorax is intact. IMPRESSION: Chronic pulmonary scarring changes without definite acute overlying pulmonary process. Electronically Signed   By: Marijo Sanes M.D.   On: 07/21/2016 14:27  Dg Hip Port Unilat With Pelvis 1v Right  Result Date: 07/29/2016 CLINICAL DATA:  82 year old  male status post right hip replacement. Initial encounter. EXAM: DG HIP (WITH OR WITHOUT PELVIS) 1V PORT RIGHT COMPARISON:  None. FINDINGS: Portable AP and cross-table lateral views of the right hip and pelvis at 1614 hours. Right total hip arthroplasty sequelae noted. Hardware appears intact and normally aligned. Overlying postoperative changes to the soft tissues. No unexpected osseous changes identified. Right femoral artery calcified atherosclerosis. IMPRESSION: Right total hip arthroplasty with no adverse features. Electronically Signed   By: Genevie Ann M.D.   On: 07/29/2016 17:09     Examination:  General appearance: alert, cooperative and no distress  Wound Exam: clean, dry, intact   Drainage:  None: wound tissue dry  Motor Exam: EHL, FHL and Anterior Tibial Intact  Sensory Exam: Superficial Peroneal, Deep Peroneal and Tibial normal  Vascular Exam: Normal  Assessment:    3 Days Post-Op  Procedure(s) (LRB): TOTAL HIP ARTHROPLASTY (Right)  ADDITIONAL DIAGNOSIS:  Active Problems:   Arteriosclerotic cardiovascular disease (ASCVD)   Essential hypertension    Cardiomyopathy, ischemic   Primary osteoarthritis of right hip   Hypotension   Chronic combined systolic and diastolic CHF (congestive heart failure) (HCC)   S/P total hip arthroplasty   Post-operative state   Postprocedural hypotension  Acute Blood Loss Anemia-stable and asymptomatic   Plan: Physical Therapy as ordered Weight Bearing as Tolerated (WBAT)  DVT Prophylaxis:  Xarelto and TED hose  DISCHARGE PLAN: Home  DISCHARGE NEEDS: HHPT, Walker and 3-in-1 comode seat    Anxious for discharge today. Voiding without difficulty, good effort in PT-will discharge    Garald Balding  08/01/2016 7:43 AM

## 2016-08-01 NOTE — Progress Notes (Signed)
Physical Therapy Treatment Patient Details Name: Jimmy Knox MRN: [id number] DOB: 1932-12-17 Today's Date: 08/01/2016    History of Present Illness Pt is an 82 y/o male s/p R THA, posterior precautions. PMH including but not limited to CHF, CAD, MI with stenting (2000), DM, and HTN.                                                                                                                                       PT Comments    Pt performed increased gait and reviewed HEP in prep for d/c home. Pt able to recall 2/3 precautions and required re-education to recall 3/3.  Wil f/u in pm if patient remains hospitalized.   Follow Up Recommendations  Home health PT;Supervision/Assistance - 24 hour     Equipment Recommendations  Rolling walker with 5" wheels;3in1 (PT)    Recommendations for Other Services       Precautions / Restrictions Precautions Precautions: Posterior Hip;Fall Precaution Booklet Issued: Yes (comment) Precaution Comments: Pt able to recall 3/3 precautions at beginning of session, and maintain during AE education and practice Required Braces or Orthoses: Knee Immobilizer - Right Knee Immobilizer - Right: Other (comment) Restrictions Weight Bearing Restrictions: Yes RLE Weight Bearing: Weight bearing as tolerated    Mobility  Bed Mobility               General bed mobility comments: Pt sitting in recliner on arrival.    Transfers Overall transfer level: Needs assistance Equipment used: Rolling walker (2 wheeled) Transfers: Sit to/from Stand Sit to Stand: Supervision         General transfer comment: Pt required increased time and max cues for hand placement to push from seated surface.  Pt required cues to advance RLE to maintain posterior hip precautions.    Ambulation/Gait Ambulation/Gait assistance: Min guard Ambulation Distance (Feet): 250 Feet Assistive device: Rolling walker (2 wheeled) Gait Pattern/deviations: Step-through pattern;Trunk  flexed Gait velocity: decreased Gait velocity interpretation: Below normal speed for age/gender General Gait Details: Cues for upper trunk control and forward gaze.     Stairs Stairs:  (Pt reports he is comfortable in performing stair training at home based on previous session/ training.  )          Wheelchair Mobility    Modified Rankin (Stroke Patients Only)       Balance Overall balance assessment: Needs assistance   Sitting balance-Leahy Scale: Fair       Standing balance-Leahy Scale: Poor                      Cognition Arousal/Alertness: Awake/alert Behavior During Therapy: WFL for tasks assessed/performed Overall Cognitive Status: Within Functional Limits for tasks assessed       Memory: Decreased recall of precautions              Exercises Total Joint Exercises Ankle Circles/Pumps: AROM;Both;10 reps;Supine Quad  Sets: AROM;Right;10 reps;Supine Short Arc Quad: AROM;Right;10 reps;Supine Heel Slides: AAROM;Right;10 reps;Supine Hip ABduction/ADduction: AAROM;Right;Supine;AROM;20 reps (1x10 in supine with AAROM and 1x10 in standing AROM.  ) Long Arc Quad: AROM;Right;10 reps;Supine Knee Flexion: AROM;Right;10 reps;Standing Marching in Standing: AROM;Right;10 reps;Standing Standing Hip Extension: AROM;Right;10 reps;Standing (cues to slow pacing.  )    General Comments        Pertinent Vitals/Pain Pain Assessment: 0-10 Pain Score: 7  Faces Pain Scale: No hurt Pain Location: R hip Pain Descriptors / Indicators: Spasm;Operative site guarding Pain Intervention(s): Monitored during session    Home Living                      Prior Function            PT Goals (current goals can now be found in the care plan section) Acute Rehab PT Goals Patient Stated Goal: return home Potential to Achieve Goals: Good Progress towards PT goals: Progressing toward goals    Frequency  7X/week    PT Plan Current plan remains appropriate     Co-evaluation             End of Session Equipment Utilized During Treatment: Gait belt Activity Tolerance: Patient limited by fatigue Patient left: in chair;with call bell/phone within reach;with family/visitor present     Time: [id number] PT Time Calculation (min) (ACUTE ONLY): 21 min  Charges:  $Gait Training: 8-22 mins                    G Codes:      Cristela Blue August 03, 2016, 9:49 AM  Governor Rooks, PTA pager [phone]

## 2016-08-01 NOTE — Progress Notes (Signed)
Patient discharged to home, discharge instructions given, patient stated he understood

## 2016-08-02 DIAGNOSIS — Z87891 Personal history of nicotine dependence: Secondary | ICD-10-CM | POA: Diagnosis not present

## 2016-08-02 DIAGNOSIS — F329 Major depressive disorder, single episode, unspecified: Secondary | ICD-10-CM | POA: Diagnosis not present

## 2016-08-02 DIAGNOSIS — N200 Calculus of kidney: Secondary | ICD-10-CM | POA: Diagnosis not present

## 2016-08-02 DIAGNOSIS — I110 Hypertensive heart disease with heart failure: Secondary | ICD-10-CM | POA: Diagnosis not present

## 2016-08-02 DIAGNOSIS — I252 Old myocardial infarction: Secondary | ICD-10-CM | POA: Diagnosis not present

## 2016-08-02 DIAGNOSIS — I2510 Atherosclerotic heart disease of native coronary artery without angina pectoris: Secondary | ICD-10-CM | POA: Diagnosis not present

## 2016-08-02 DIAGNOSIS — E119 Type 2 diabetes mellitus without complications: Secondary | ICD-10-CM | POA: Diagnosis not present

## 2016-08-02 DIAGNOSIS — Z7901 Long term (current) use of anticoagulants: Secondary | ICD-10-CM | POA: Diagnosis not present

## 2016-08-02 DIAGNOSIS — I5042 Chronic combined systolic (congestive) and diastolic (congestive) heart failure: Secondary | ICD-10-CM | POA: Diagnosis not present

## 2016-08-02 DIAGNOSIS — Z85828 Personal history of other malignant neoplasm of skin: Secondary | ICD-10-CM | POA: Diagnosis not present

## 2016-08-02 DIAGNOSIS — I429 Cardiomyopathy, unspecified: Secondary | ICD-10-CM | POA: Diagnosis not present

## 2016-08-02 DIAGNOSIS — Z471 Aftercare following joint replacement surgery: Secondary | ICD-10-CM | POA: Diagnosis not present

## 2016-08-02 DIAGNOSIS — Z96641 Presence of right artificial hip joint: Secondary | ICD-10-CM | POA: Diagnosis not present

## 2016-08-04 ENCOUNTER — Encounter (HOSPITAL_COMMUNITY): Payer: Self-pay | Admitting: Orthopaedic Surgery

## 2016-08-04 DIAGNOSIS — F329 Major depressive disorder, single episode, unspecified: Secondary | ICD-10-CM | POA: Diagnosis not present

## 2016-08-04 DIAGNOSIS — I2510 Atherosclerotic heart disease of native coronary artery without angina pectoris: Secondary | ICD-10-CM | POA: Diagnosis not present

## 2016-08-04 DIAGNOSIS — I5042 Chronic combined systolic (congestive) and diastolic (congestive) heart failure: Secondary | ICD-10-CM | POA: Diagnosis not present

## 2016-08-04 DIAGNOSIS — Z471 Aftercare following joint replacement surgery: Secondary | ICD-10-CM | POA: Diagnosis not present

## 2016-08-04 DIAGNOSIS — I110 Hypertensive heart disease with heart failure: Secondary | ICD-10-CM | POA: Diagnosis not present

## 2016-08-04 DIAGNOSIS — E119 Type 2 diabetes mellitus without complications: Secondary | ICD-10-CM | POA: Diagnosis not present

## 2016-08-06 DIAGNOSIS — I2510 Atherosclerotic heart disease of native coronary artery without angina pectoris: Secondary | ICD-10-CM | POA: Diagnosis not present

## 2016-08-06 DIAGNOSIS — F329 Major depressive disorder, single episode, unspecified: Secondary | ICD-10-CM | POA: Diagnosis not present

## 2016-08-06 DIAGNOSIS — I5042 Chronic combined systolic (congestive) and diastolic (congestive) heart failure: Secondary | ICD-10-CM | POA: Diagnosis not present

## 2016-08-06 DIAGNOSIS — E119 Type 2 diabetes mellitus without complications: Secondary | ICD-10-CM | POA: Diagnosis not present

## 2016-08-06 DIAGNOSIS — I110 Hypertensive heart disease with heart failure: Secondary | ICD-10-CM | POA: Diagnosis not present

## 2016-08-06 DIAGNOSIS — Z471 Aftercare following joint replacement surgery: Secondary | ICD-10-CM | POA: Diagnosis not present

## 2016-08-07 NOTE — Discharge Summary (Signed)
Jimmy Fears, MD   Jimmy Borg, PA-C [address], [place], [place]  [address]                             [phone]  PATIENT ID: Jimmy Knox        MRN:  [id number]          DOB/AGE: 03/11/1932 / 82 y.o.    DISCHARGE SUMMARY  ADMISSION DATE:    07/29/2016 DISCHARGE DATE:   08/01/2016   ADMISSION DIAGNOSIS: RIGHT HIP OSTEOARTHRITIS    DISCHARGE DIAGNOSIS:  RIGHT HIP OSTEOARTHRITIS    ADDITIONAL DIAGNOSIS: Active Problems:   Arteriosclerotic cardiovascular disease (ASCVD)   Essential hypertension   Cardiomyopathy, ischemic   Primary osteoarthritis of right hip   Hypotension   Chronic combined systolic and diastolic CHF (congestive heart failure) (HCC)   S/P total hip arthroplasty   Post-operative state   Postprocedural hypotension  Past Medical History:  Diagnosis Date  . Arthritis    "right hip" (07/31/2016)  . Carotid artery disease ([place])    a. <50% bilaterally 05/2016.  Marland Kitchen Chronic combined systolic and diastolic CHF (congestive heart failure) ([place])   . Complication of anesthesia    "didn't wake up well after OR on 07/30/2016; took 2-3 to hold me down"  . Coronary artery disease    a. prior MI/stent around 2000. b. Nuc 09/2015 -> abnl, mgmd medically.  Marland Kitchen History of blood transfusion 07/30/2016   "after the operation"  . Hyperlipidemia   . Hypertension   . Ischemic cardiomyopathy   . Melanoma of nose ([place])    "inside on the right side"  . Myocardial infarction ([place]) ~ 2000  . Pneumonia 1935- 1970s ,"several times"  . Rocky Mountain spotted fever 1942  . Skin cancer    "on my back; arms; froze them off"  . Type 2 diabetes, diet controlled ([place])     PROCEDURE: Procedure(s): TOTAL HIP ARTHROPLASTY Right on 07/29/2016  CONSULTS: none Treatment Team:  [facility], MD   HISTORY: Jimmy Knox is a very pleasant, 82 year old, white male who is seen today for evaluation of his right hip. He has had problems in the past with his right hip,  causing him pain and discomfort. He has had some pain in the groin area, but has also noted decreased motion of the right hip secondary to pain. He does not remember any history of injury or trauma. He does have some symptoms of pain along the groin, anterior thigh, and into the lateral aspect of the trochanteric region. He has gotten to the point now where as he is walking, he has an antalgic limp. He is now having pain with every step. He has had a corticosteroid injection, which had some benefit, at least temporarily. He has problems with activities of daily living secondary to pain in the groin. He has had a myocardial infarction in the past and had stenting in 2000.  HOSPITAL COURSE:  Jimmy Knox is a 82 y.o. admitted on 07/29/2016 and found to have a diagnosis of RIGHT HIP OSTEOARTHRITIS.  After appropriate laboratory studies were obtained  they were taken to the operating room on 07/29/2016 and underwent  Procedure(s): TOTAL HIP ARTHROPLASTY  Right.   They were given perioperative antibiotics:  Anti-infectives    Start     Dose/Rate Route Frequency Ordered Stop   07/29/16 2100  ceFAZolin (ANCEF) IVPB 2g/100 mL premix     2 g 200  mL/hr over 30 Minutes Intravenous Every 6 hours 07/29/16 1951 07/30/16 0409   07/29/16 1000  ceFAZolin (ANCEF) IVPB 2g/100 mL premix     2 g 200 mL/hr over 30 Minutes Intravenous To Surgery 07/29/16 0950 07/29/16 1221   07/29/16 0947  ceFAZolin (ANCEF) 2-4 GM/100ML-% IVPB    Comments:  Jimmy Knox   : cabinet override      07/29/16 0947 07/29/16 2159    .  Tolerated the procedure well.  Toradol was given post op.  POD #1, allowed out of bed to a chair.  PT for ambulation and exercise program.  IV saline locked.  O2 discontionued.  POD #2, continued PT and ambulation.    POD #3, became ambulatory and able to be more independent. The remainder of the hospital course was dedicated to ambulation and strengthening.   The patient was discharged on 9  Days Post-Op in  Stable condition.  Blood products given:none  DIAGNOSTIC STUDIES: Recent vital signs: No data found.      Recent laboratory studies:  Recent Labs  08/01/16 0329  WBC 12.6*  HGB 10.5*  HCT 31.7*  PLT 179    Recent Labs  08/01/16 0329  NA 137  K 3.7  CL 104  CO2 25  BUN 16  CREATININE 1.04  GLUCOSE 125*  CALCIUM 8.6*   Lab Results  Component Value Date   INR 1.03 07/21/2016     Recent Radiographic Studies :  Dg Chest 2 View  Result Date: 07/21/2016 CLINICAL DATA:  Preop for total hip arthroplasty. EXAM: CHEST  2 VIEW COMPARISON:  Chest x-ray 02/10/2013 FINDINGS: Aging cardiac silhouette, mediastinal and hilar contours are stable. Mild tortuosity of the thoracic aorta and slightly prominent mediastinal contours are stable. There are fairly extensive chronic scarring changes likely a combination of emphysema and chronic bronchitic change. No definite acute pulmonary findings or worrisome pulmonary lesions. The bony thorax is intact. IMPRESSION: Chronic pulmonary scarring changes without definite acute overlying pulmonary process. Electronically Signed   By: Marijo Sanes M.D.   On: 07/21/2016 14:27  Dg Hip Port Unilat With Pelvis 1v Right  Result Date: 07/29/2016 CLINICAL DATA:  82 year old male status post right hip replacement. Initial encounter. EXAM: DG HIP (WITH OR WITHOUT PELVIS) 1V PORT RIGHT COMPARISON:  None. FINDINGS: Portable AP and cross-table lateral views of the right hip and pelvis at 1614 hours. Right total hip arthroplasty sequelae noted. Hardware appears intact and normally aligned. Overlying postoperative changes to the soft tissues. No unexpected osseous changes identified. Right femoral artery calcified atherosclerosis. IMPRESSION: Right total hip arthroplasty with no adverse features. Electronically Signed   By: Genevie Ann M.D.   On: 07/29/2016 17:09    DISCHARGE INSTRUCTIONS: Discharge Instructions    Call MD / Call 911    Complete by:   As directed   If you experience chest pain or shortness of breath, CALL 911 and be transported to the hospital emergency room.  If you develope a fever above 101 F, pus (white drainage) or increased drainage or redness at the wound, or calf pain, call your surgeon's office.   Change dressing    Complete by:  As directed   DO NOT CHANGE DRESSING. KEEP ON TILL SEEN IN THE OFFICE   Constipation Prevention    Complete by:  As directed   Drink plenty of fluids.  Prune juice may be helpful.  You may use a stool softener, such as Colace (over the counter) 100 mg twice a day.  Use MiraLax (over the counter) for constipation as needed.   Diet general    Complete by:  As directed   Discharge instructions    Complete by:  As directed   [place] items at home which could result in a fall. This includes throw rugs or furniture in walking pathways ICE to the affected joint every three hours while awake for 30 minutes at a time, for at least the first 3-5 days, and then as needed for pain and swelling.  Continue to use ice for pain and swelling. You may notice swelling that will progress down to the foot and ankle.  This is normal after surgery.  Elevate your leg when you are not up walking on it.   Continue to use the breathing machine you got in the hospital (incentive spirometer) which will help keep your temperature down.  It is common for your temperature to cycle up and down following surgery, especially at night when you are not up moving around and exerting yourself.  The breathing machine keeps your lungs expanded and your temperature down.   DIET:  As you were doing prior to hospitalization, we recommend a well-balanced diet.  DRESSING / WOUND CARE / SHOWERING  Keep the surgical dressing until follow up.  The dressing is water proof, so you can shower without any extra covering.  IF THE DRESSING FALLS OFF or the wound gets wet inside, change the dressing with  sterile gauze.  Please use good hand washing techniques before changing the dressing.  Do not use any lotions or creams on the incision until instructed by your surgeon.    ACTIVITY  Increase activity slowly as tolerated, but follow the weight bearing instructions below.   No driving for 6 weeks or until further direction given by your physician.  You cannot drive while taking narcotics.  No lifting or carrying greater than 10 lbs. until further directed by your surgeon. Avoid periods of inactivity such as sitting longer than an hour when not asleep. This helps prevent blood clots.  You may return to work once you are authorized by your doctor.     WEIGHT BEARING   Weight bearing as tolerated with assist device (walker, cane, etc) as directed, use it as long as suggested by your surgeon or therapist, typically at least 4-6 weeks.   EXERCISES  Results after joint replacement surgery are often greatly improved when you follow the exercise, range of motion and muscle strengthening exercises prescribed by your doctor. Safety measures are also important to protect the joint from further injury. Any time any of these exercises cause you to have increased pain or swelling, decrease what you are doing until you are comfortable again and then slowly increase them. If you have problems or questions, call your caregiver or physical therapist for advice.   Rehabilitation is important following a joint replacement. After just a few days of immobilization, the muscles of the leg can become weakened and shrink (atrophy).  These exercises are designed to build up the tone and strength of the thigh and leg muscles and to improve motion. Often times heat used for twenty to thirty minutes before working out will loosen up your tissues and help with improving the range of motion but do not use heat for the first two weeks following surgery (sometimes heat can increase post-operative swelling).   These exercises  can be done on a training (exercise) mat, on a table or on a  bed. Use whatever works the best and is most comfortable for you.    Use music or television while you are exercising so that the exercises are a pleasant break in your day. This will make your life better with the exercises acting as a break in your routine that you can look forward to.   Perform all exercises about fifteen times, three times per day or as directed.  You should exercise both the operative leg and the other leg as well.   Exercises include:  Quad Sets - Tighten up the muscle on the front of the thigh (Quad) and hold for 5-10 seconds.   Straight Leg Raises - With your knee straight (if you were given a brace, keep it on), lift the leg to 60 degrees, hold for 3 seconds, and slowly lower the leg.  Perform this exercise against resistance later as your leg gets stronger.  Leg Slides: Lying on your back, slowly slide your foot toward your buttocks, bending your knee up off the floor (only go as far as is comfortable). Then slowly slide your foot back down until your leg is flat on the floor again.  Angel Wings: Lying on your back spread your legs to the side as far apart as you can without causing discomfort.  Hamstring Strength:  Lying on your back, push your heel against the floor with your leg straight by tightening up the muscles of your buttocks.  Repeat, but this time bend your knee to a comfortable angle, and push your heel against the floor.  You may put a pillow under the heel to make it more comfortable if necessary.   A rehabilitation program following joint replacement surgery can speed recovery and prevent re-injury in the future due to weakened muscles. Contact your doctor or a physical therapist for more information on knee rehabilitation.    CONSTIPATION  Constipation is defined medically as fewer than three stools per week and severe constipation as less than one stool per week.  Even if you have a regular bowel  pattern at home, your normal regimen is likely to be disrupted due to multiple reasons following surgery.  Combination of anesthesia, postoperative narcotics, change in appetite and fluid intake all can affect your bowels.   YOU MUST use at least one of the following options; they are listed in order of increasing strength to get the job done.  They are all available over the counter, and you may need to use some, POSSIBLY even all of these options:    Drink plenty of fluids (prune juice may be helpful) and high fiber foods Colace 100 mg by mouth twice a day  Senokot for constipation as directed and as needed Dulcolax (bisacodyl), take with full glass of water  Miralax (polyethylene glycol) once or twice a day as needed.  If you have tried all these things and are unable to have a bowel movement in the first 3-4 days after surgery call either your surgeon or your primary doctor.    If you experience loose stools or diarrhea, hold the medications until you stool forms back up.  If your symptoms do not get better within 1 week or if they get worse, check with your doctor.  If you experience "the worst abdominal pain ever" or develop nausea or vomiting, please contact the office immediately for further recommendations for treatment.   ITCHING:  If you experience itching with your medications, try taking only a single pain pill, or even half a  pain pill at a time.  You can also use Benadryl over the counter for itching or also to help with sleep.   TED HOSE STOCKINGS:  Use stockings on both legs until for at least 2 weeks or as directed by physician office. They may be removed at night for sleeping.  MEDICATIONS:  See your medication summary on the "After Visit Summary" that nursing will review with you.  You may have some home medications which will be placed on hold until you complete the course of blood thinner medication.  It is important for you to complete the blood thinner medication as  prescribed.  PRECAUTIONS:  If you experience chest pain or shortness of breath - call 911 immediately for transfer to the hospital emergency department.   If you develop a fever greater that 101 F, purulent drainage from wound, increased redness or drainage from wound, foul odor from the wound/dressing, or calf pain - CONTACT YOUR SURGEON.                                                   FOLLOW-UP APPOINTMENTS:  If you do not already have a post-op appointment, please call the office for an appointment to be seen by your surgeon.  Guidelines for how soon to be seen are listed in your "After Visit Summary", but are typically between 1-4 weeks after surgery.  OTHER INSTRUCTIONS:   Knee Replacement:  Do not place pillow under knee, focus on keeping the knee straight while resting. CPM instructions: 0-90 degrees, 2 hours in the morning, 2 hours in the afternoon, and 2 hours in the evening. Place foam block, curve side up under heel at all times except when in CPM or when walking.  DO NOT modify, tear, cut, or change the foam block in any way.  MAKE SURE YOU:  Understand these instructions.  Get help right away if you are not doing well or get worse.    Thank you for letting us be a part of your medical care team.  It is a privilege we respect greatly.  We hope these instructions will help you stay on track for a fast and full recovery!   Driving restrictions    Complete by:  As directed   No driving for 6 weeks   Follow the hip precautions as taught in Physical Therapy    Complete by:  As directed   Increase activity slowly as tolerated    Complete by:  As directed   Lifting restrictions    Complete by:  As directed   No lifting for 6 weeks   Patient may shower    Complete by:  As directed   [place].  DO NOT REMOVE DRESSING   TED hose    Complete by:  As directed   Use stockings (TED hose) for 2 weeks on RIGHT leg(s).  You may remove them at night for sleeping.    Weight bearing as tolerated    Complete by:  As directed   Laterality:  right   Extremity:  Lower      DISCHARGE MEDICATIONS:     Medication List    STOP taking these medications   aspirin 81 MG chewable tablet   vitamin E 400 UNIT capsule     TAKE these medications   amitriptyline 50  MG tablet Commonly known as:  ELAVIL Take 50 mg by mouth at bedtime.   furosemide 20 MG tablet Commonly known as:  LASIX Take 1 tablet (20 mg total) by mouth daily.   HYDROcodone-acetaminophen 5-325 MG tablet Commonly known as:  NORCO/VICODIN Take 1-2 tablets by mouth every 4 (four) hours as needed (breakthrough pain).   losartan 25 MG tablet Commonly known as:  COZAAR Take 1 tablet (25 mg total) by mouth daily.   methocarbamol 500 MG tablet Commonly known as:  ROBAXIN Take 1 tablet (500 mg total) by mouth every 8 (eight) hours as needed for muscle spasms.   metoprolol succinate 25 MG 24 hr tablet Commonly known as:  TOPROL-XL Take 25 mg by mouth daily.   rivaroxaban 10 MG Tabs tablet Commonly known as:  XARELTO Take 1 tablet (10 mg total) by mouth daily with breakfast.   simvastatin 40 MG tablet Commonly known as:  ZOCOR Take 20 mg by mouth every evening.   Vitamin D3 2000 units capsule Take 2,000 Units by mouth daily.       FOLLOW UP VISIT:    DISPOSITION:   Home  CONDITION:  Stable   Aaron Edelman D. [place], [place] [phone]  08/07/2016 3:29 PM

## 2016-08-08 DIAGNOSIS — F329 Major depressive disorder, single episode, unspecified: Secondary | ICD-10-CM | POA: Diagnosis not present

## 2016-08-08 DIAGNOSIS — E119 Type 2 diabetes mellitus without complications: Secondary | ICD-10-CM | POA: Diagnosis not present

## 2016-08-08 DIAGNOSIS — I110 Hypertensive heart disease with heart failure: Secondary | ICD-10-CM | POA: Diagnosis not present

## 2016-08-08 DIAGNOSIS — I5042 Chronic combined systolic (congestive) and diastolic (congestive) heart failure: Secondary | ICD-10-CM | POA: Diagnosis not present

## 2016-08-08 DIAGNOSIS — I2510 Atherosclerotic heart disease of native coronary artery without angina pectoris: Secondary | ICD-10-CM | POA: Diagnosis not present

## 2016-08-08 DIAGNOSIS — Z471 Aftercare following joint replacement surgery: Secondary | ICD-10-CM | POA: Diagnosis not present

## 2016-08-12 DIAGNOSIS — Z471 Aftercare following joint replacement surgery: Secondary | ICD-10-CM | POA: Diagnosis not present

## 2016-08-12 DIAGNOSIS — I110 Hypertensive heart disease with heart failure: Secondary | ICD-10-CM | POA: Diagnosis not present

## 2016-08-12 DIAGNOSIS — I2510 Atherosclerotic heart disease of native coronary artery without angina pectoris: Secondary | ICD-10-CM | POA: Diagnosis not present

## 2016-08-12 DIAGNOSIS — E119 Type 2 diabetes mellitus without complications: Secondary | ICD-10-CM | POA: Diagnosis not present

## 2016-08-12 DIAGNOSIS — I5042 Chronic combined systolic (congestive) and diastolic (congestive) heart failure: Secondary | ICD-10-CM | POA: Diagnosis not present

## 2016-08-12 DIAGNOSIS — F329 Major depressive disorder, single episode, unspecified: Secondary | ICD-10-CM | POA: Diagnosis not present

## 2016-08-13 DIAGNOSIS — M1611 Unilateral primary osteoarthritis, right hip: Secondary | ICD-10-CM | POA: Diagnosis not present

## 2016-08-14 DIAGNOSIS — E119 Type 2 diabetes mellitus without complications: Secondary | ICD-10-CM | POA: Diagnosis not present

## 2016-08-14 DIAGNOSIS — I110 Hypertensive heart disease with heart failure: Secondary | ICD-10-CM | POA: Diagnosis not present

## 2016-08-14 DIAGNOSIS — I2510 Atherosclerotic heart disease of native coronary artery without angina pectoris: Secondary | ICD-10-CM | POA: Diagnosis not present

## 2016-08-14 DIAGNOSIS — F329 Major depressive disorder, single episode, unspecified: Secondary | ICD-10-CM | POA: Diagnosis not present

## 2016-08-14 DIAGNOSIS — Z471 Aftercare following joint replacement surgery: Secondary | ICD-10-CM | POA: Diagnosis not present

## 2016-08-14 DIAGNOSIS — I5042 Chronic combined systolic (congestive) and diastolic (congestive) heart failure: Secondary | ICD-10-CM | POA: Diagnosis not present

## 2016-11-24 ENCOUNTER — Other Ambulatory Visit: Payer: Self-pay | Admitting: Cardiovascular Disease

## 2016-11-26 ENCOUNTER — Ambulatory Visit (INDEPENDENT_AMBULATORY_CARE_PROVIDER_SITE_OTHER): Payer: Self-pay | Admitting: Orthopaedic Surgery

## 2016-11-27 DIAGNOSIS — D481 Neoplasm of uncertain behavior of connective and other soft tissue: Secondary | ICD-10-CM | POA: Diagnosis not present

## 2016-11-27 DIAGNOSIS — E119 Type 2 diabetes mellitus without complications: Secondary | ICD-10-CM | POA: Diagnosis not present

## 2016-11-27 DIAGNOSIS — H47231 Glaucomatous optic atrophy, right eye: Secondary | ICD-10-CM | POA: Diagnosis not present

## 2016-12-12 DIAGNOSIS — C4492 Squamous cell carcinoma of skin, unspecified: Secondary | ICD-10-CM | POA: Diagnosis not present

## 2016-12-12 DIAGNOSIS — D481 Neoplasm of uncertain behavior of connective and other soft tissue: Secondary | ICD-10-CM | POA: Diagnosis not present

## 2016-12-12 DIAGNOSIS — D485 Neoplasm of uncertain behavior of skin: Secondary | ICD-10-CM | POA: Diagnosis not present

## 2016-12-17 ENCOUNTER — Ambulatory Visit (INDEPENDENT_AMBULATORY_CARE_PROVIDER_SITE_OTHER): Payer: Self-pay | Admitting: Orthopaedic Surgery

## 2016-12-17 VITALS — Resp 14 | Ht 72.0 in | Wt 215.0 lb

## 2016-12-17 DIAGNOSIS — Z96641 Presence of right artificial hip joint: Secondary | ICD-10-CM

## 2016-12-17 NOTE — Progress Notes (Signed)
Office Visit Note   Patient: Jimmy Knox           Date of Birth: November 27, 1932           MRN: [id number] Visit Date: 12/17/2016              Requested by: Asencion Noble, MD [address] [place], [place] [address] PCP: Asencion Noble, MD   Assessment & Plan: Visit Diagnoses: 4 months status post right total hip replacement with no pain. Mr. Argomaniz is doing exceptionally well and very happy   Plan: Follow-up in 8 months.  Follow-Up Instructions: No Follow-up on file.   Orders:  No orders of the defined types were placed in this encounter.  No orders of the defined types were placed in this encounter.     Procedures: No procedures performed   Clinical Data: No additional findings.   Subjective: Chief Complaint  Patient presents with  . Right Hip - Routine Post Op    Pt has been doing well, no pain.   No related pain. No difficulty sleeping. No neurovascular issues. Patient feels that both legs are the same length Review of Systems   Objective: Vital Signs: There were no vitals taken for this visit.  Physical Exam  Ortho Exam painless range of motion right hip. No swelling distally. Neurovascular exam is intact. Leg lengths appeared to be equal.  Specialty Comments:  No specialty comments available.  Imaging: No results found.   PMFS History: Patient Active Problem List   Diagnosis Date Noted  . Primary osteoarthritis of right hip 07/29/2016  . Hypotension 07/29/2016  . Chronic combined systolic and diastolic CHF (congestive heart failure) ([facility]) 07/29/2016  . S/P total hip arthroplasty 07/29/2016  . Post-operative state   . Postprocedural hypotension   . Cardiomyopathy, ischemic 10/12/2013  . Carotid artery stenosis 10/12/2013  . Cerebrovascular disease 02/14/2013  . Arteriosclerotic cardiovascular disease (ASCVD)   . Essential hypertension   . Hyperlipidemia   . Fasting hyperglycemia    Past Medical History:  Diagnosis Date  .  Arthritis    "right hip" (07/31/2016)  . Carotid artery disease ([place])    a. <50% bilaterally 05/2016.  Marland Kitchen Chronic combined systolic and diastolic CHF (congestive heart failure) ([facility])   . Complication of anesthesia    "didn't wake up well after OR on 07/30/2016; took 2-3 to hold me down"  . Coronary artery disease    a. prior MI/stent around 2000. b. Nuc 09/2015 -> abnl, mgmd medically.  Marland Kitchen History of blood transfusion 07/30/2016   "after the operation"  . Hyperlipidemia   . Hypertension   . Ischemic cardiomyopathy   . Melanoma of nose ([place])    "inside on the right side"  . Myocardial infarction ~ 2000  . Pneumonia 1935- 1970s ,"several times"  . Rocky Mountain spotted fever 1942  . Skin cancer    "on my back; arms; froze them off"  . Type 2 diabetes, diet controlled ([place])     Family History  Problem Relation Age of Onset  . Diabetes Mellitus II Brother     x2 brothers  . Alzheimer's disease Mother     Past Surgical History:  Procedure Laterality Date  . APPENDECTOMY    . BACK SURGERY    . CATARACT EXTRACTION, BILATERAL Bilateral   . COLONOSCOPY N/A 09/28/2015   Procedure: COLONOSCOPY;  Surgeon: Rogene Houston, MD;  Location: [facility];  Service: Endoscopy;  Laterality: N/A;  955  . CORONARY ANGIOPLASTY  WITH STENT PLACEMENT  2000  . CYSTOSCOPY W/ URETERAL STENT PLACEMENT    . EYE SURGERY Right 2013   "got hit by a limb & knocked my eye out"  . JOINT REPLACEMENT    . [place]  . MELANOMA EXCISION Right    "inside my nose"  . TONSILLECTOMY    . TOTAL HIP ARTHROPLASTY Right 07/29/2016  . TOTAL HIP ARTHROPLASTY Right 07/29/2016   Procedure: TOTAL HIP ARTHROPLASTY;  Surgeon: Garald Balding, MD;  Location: [facility];  Service: Orthopedics;  Laterality: Right;   Social History   Occupational History  . Retired    Social History Main Topics  . Smoking status: Former Smoker    Packs/day: 1.00    Years: 20.00    Types: Cigarettes    Start date: 12/30/1943  .  Smokeless tobacco: Never Used     Comment: "stopped smoking in 1970 when I got pneumonia"  . Alcohol use No  . Drug use: No  . Sexual activity: No

## 2016-12-30 DIAGNOSIS — H6123 Impacted cerumen, bilateral: Secondary | ICD-10-CM | POA: Diagnosis not present

## 2016-12-30 DIAGNOSIS — C44320 Squamous cell carcinoma of skin of unspecified parts of face: Secondary | ICD-10-CM | POA: Diagnosis not present

## 2017-01-09 DIAGNOSIS — C44320 Squamous cell carcinoma of skin of unspecified parts of face: Secondary | ICD-10-CM | POA: Diagnosis not present

## 2017-01-09 DIAGNOSIS — L989 Disorder of the skin and subcutaneous tissue, unspecified: Secondary | ICD-10-CM | POA: Diagnosis not present

## 2017-01-09 DIAGNOSIS — C44129 Squamous cell carcinoma of skin of left eyelid, including canthus: Secondary | ICD-10-CM | POA: Diagnosis not present

## 2017-01-15 ENCOUNTER — Ambulatory Visit: Payer: Medicare Other | Admitting: Cardiovascular Disease

## 2017-01-21 DIAGNOSIS — R05 Cough: Secondary | ICD-10-CM | POA: Diagnosis not present

## 2017-01-21 DIAGNOSIS — Z6828 Body mass index (BMI) 28.0-28.9, adult: Secondary | ICD-10-CM | POA: Diagnosis not present

## 2017-02-20 DIAGNOSIS — C44320 Squamous cell carcinoma of skin of unspecified parts of face: Secondary | ICD-10-CM | POA: Diagnosis not present

## 2017-02-25 ENCOUNTER — Encounter: Payer: Self-pay | Admitting: Cardiovascular Disease

## 2017-02-25 ENCOUNTER — Ambulatory Visit (INDEPENDENT_AMBULATORY_CARE_PROVIDER_SITE_OTHER): Payer: Medicare Other | Admitting: Cardiovascular Disease

## 2017-02-25 VITALS — BP 112/70 | HR 85 | Ht 72.0 in | Wt 209.0 lb

## 2017-02-25 DIAGNOSIS — I255 Ischemic cardiomyopathy: Secondary | ICD-10-CM

## 2017-02-25 DIAGNOSIS — I10 Essential (primary) hypertension: Secondary | ICD-10-CM | POA: Diagnosis not present

## 2017-02-25 DIAGNOSIS — I739 Peripheral vascular disease, unspecified: Secondary | ICD-10-CM

## 2017-02-25 DIAGNOSIS — E7800 Pure hypercholesterolemia, unspecified: Secondary | ICD-10-CM | POA: Diagnosis not present

## 2017-02-25 DIAGNOSIS — I779 Disorder of arteries and arterioles, unspecified: Secondary | ICD-10-CM | POA: Diagnosis not present

## 2017-02-25 DIAGNOSIS — I252 Old myocardial infarction: Secondary | ICD-10-CM | POA: Diagnosis not present

## 2017-02-25 DIAGNOSIS — I25810 Atherosclerosis of coronary artery bypass graft(s) without angina pectoris: Secondary | ICD-10-CM

## 2017-02-25 NOTE — Patient Instructions (Signed)

## 2017-02-25 NOTE — Progress Notes (Signed)
SUBJECTIVE: The patient presents for follow-up of coronary artery disease. Nuclear stress test on 10/17/15 showed a large area of myocardial scar primarily in the inferior, inferoseptal, and inferolateral walls extending from the apex to the base. There was a mild degree of peri-infarct ischemia, calculated LVEF 42%. It was an intermediate risk study. He has a h/o CAD with a prior MI and stent around 2000, carotid artery disease (less than 50% stenosis bilaterally on 06/14/15), ischemic cardiomyopathy, essential HTN, and hyperlipidemia.   Echocardiogram 07/30/16 had poor endocardial definition even with contrast enhancement, with septal and apical hypokinesis. Overall LVEF was deemed "probably low normal". Mild LVH was also noted.  Carotid Dopplers 06/20/16 less than 50% internal carotid artery stenosis bilaterally.  He developed post operative hypotension in August 2017 after right hip replacement surgery. Blood pressure responded well to packed red blood cell transfusion and neosynephrine.  He denies chest pain, palpitations, leg swelling, and shortness of breath.   SocHx: Married for 57 years this past June 2017. Has 2 children and 3 granddaughters who live fairly close to them.    Review of Systems: As per "subjective", otherwise negative.  No Known Allergies  Current Outpatient Prescriptions  Medication Sig Dispense Refill  . amitriptyline (ELAVIL) 50 MG tablet Take 50 mg by mouth at bedtime.    . Cholecalciferol (VITAMIN D3) 2000 units capsule Take 2,000 Units by mouth daily.    Marland Kitchen losartan (COZAAR) 25 MG tablet TAKE 1 TABLET(25 MG) BY MOUTH DAILY 90 tablet 0  . metoprolol succinate (TOPROL-XL) 25 MG 24 hr tablet Take 25 mg by mouth daily.    . simvastatin (ZOCOR) 40 MG tablet Take 20 mg by mouth every evening.      No current facility-administered medications for this visit.     Past Medical History:  Diagnosis Date  . Arthritis    "right hip" (07/31/2016)  . Carotid  artery disease ([place])    a. <50% bilaterally 05/2016.  Marland Kitchen Chronic combined systolic and diastolic CHF (congestive heart failure) ([place])   . Complication of anesthesia    "didn't wake up well after OR on 07/30/2016; took 2-3 to hold me down"  . Coronary artery disease    a. prior MI/stent around 2000. b. Nuc 09/2015 -> abnl, mgmd medically.  Marland Kitchen History of blood transfusion 07/30/2016   "after the operation"  . Hyperlipidemia   . Hypertension   . Ischemic cardiomyopathy   . Melanoma of nose ([place])    "inside on the right side"  . Myocardial infarction ~ 2000  . Pneumonia 1935- 1970s ,"several times"  . Rocky Mountain spotted fever 1942  . Skin cancer    "on my back; arms; froze them off"  . Type 2 diabetes, diet controlled ([place])     Past Surgical History:  Procedure Laterality Date  . APPENDECTOMY    . BACK SURGERY    . CATARACT EXTRACTION, BILATERAL Bilateral   . COLONOSCOPY N/A 09/28/2015   Procedure: COLONOSCOPY;  Surgeon: Rogene Houston, MD;  [facility];  Service: Endoscopy;  Laterality: N/A;  955  . CORONARY ANGIOPLASTY WITH STENT PLACEMENT  2000  . CYSTOSCOPY W/ URETERAL STENT PLACEMENT    . EYE SURGERY Right 2013   "got hit by a limb & knocked my eye out"  . JOINT REPLACEMENT    . [place]  . MELANOMA EXCISION Right    "inside my nose"  . TONSILLECTOMY    . TOTAL  HIP ARTHROPLASTY Right 07/29/2016  . TOTAL HIP ARTHROPLASTY Right 07/29/2016   Procedure: TOTAL HIP ARTHROPLASTY;  Surgeon: Garald Balding, MD;  Location: [facility];  Service: Orthopedics;  Laterality: Right;    Social History   Social History  . Marital status: Married    Spouse name: N/A  . Number of children: N/A  . Years of education: N/A   Occupational History  . Retired    Social History Main Topics  . Smoking status: Former Smoker    Packs/day: 1.00    Years: 20.00    Types: Cigarettes    Start date: 12/30/1943  . Smokeless tobacco: Never Used     Comment: "stopped  smoking in 1970 when I got pneumonia"  . Alcohol use No  . Drug use: No  . Sexual activity: No   Other Topics Concern  . Not on file   Social History Narrative  . No narrative on file     Vitals:   02/25/17 1036  BP: 112/70  Pulse: 85  SpO2: 94%  Weight: 209 lb (94.8 kg)  Height: 6' (1.829 m)    PHYSICAL EXAM General: NAD HEENT: Normal. Neck: No JVD, no thyromegaly. Lungs: Clear to auscultation bilaterally with normal respiratory effort. CV: Nondisplaced PMI.  Regular rate and rhythm, normal S1/S2, no S3/S4, soft 1/6 apical holosystolic murmur. No pretibial or periankle edema.  No carotid bruit.   Abdomen: Soft, nontender, no distention.  Neurologic: Alert and oriented.  Psych: Normal affect. Skin: Normal. Musculoskeletal: No gross deformities.    ECG: Most recent ECG reviewed.      ASSESSMENT AND PLAN: 1. CAD with prior MI and ischemic cardiomyopathy: Stable NYHA class II symptoms. Lexiscan Cardiolite stress test results noted above with primarily myocardial scar seen, mild peri-infarct ischemia. Will continue current therapy.   2. Essential HTN: Controlled. No changes.  3. Hyperlipidemia: On simvastatin. No changes.  4. Carotid artery disease: Carotid Dopplers 06/20/16 less than 50% internal carotid artery stenosis bilaterally. Would repeat in 2+ years.  Dispo: fu 6 months.  Kate Sable, M.D., F.A.C.C.

## 2017-04-03 DIAGNOSIS — L989 Disorder of the skin and subcutaneous tissue, unspecified: Secondary | ICD-10-CM | POA: Diagnosis not present

## 2017-04-03 DIAGNOSIS — H029 Unspecified disorder of eyelid: Secondary | ICD-10-CM | POA: Diagnosis not present

## 2017-04-21 DIAGNOSIS — C44119 Basal cell carcinoma of skin of left eyelid, including canthus: Secondary | ICD-10-CM | POA: Diagnosis not present

## 2017-04-21 DIAGNOSIS — L989 Disorder of the skin and subcutaneous tissue, unspecified: Secondary | ICD-10-CM | POA: Diagnosis not present

## 2017-05-27 DIAGNOSIS — H47231 Glaucomatous optic atrophy, right eye: Secondary | ICD-10-CM | POA: Diagnosis not present

## 2017-05-27 DIAGNOSIS — H1811 Bullous keratopathy, right eye: Secondary | ICD-10-CM | POA: Diagnosis not present

## 2017-06-11 ENCOUNTER — Other Ambulatory Visit: Payer: Self-pay

## 2017-06-11 MED ORDER — LOSARTAN POTASSIUM 25 MG PO TABS: ORAL_TABLET | ORAL | 3 refills | Status: DC

## 2017-06-11 NOTE — Telephone Encounter (Signed)
refilled losartan per fax request

## 2017-06-15 DIAGNOSIS — H1811 Bullous keratopathy, right eye: Secondary | ICD-10-CM | POA: Diagnosis not present

## 2017-06-19 DIAGNOSIS — I2510 Atherosclerotic heart disease of native coronary artery without angina pectoris: Secondary | ICD-10-CM | POA: Diagnosis not present

## 2017-06-19 DIAGNOSIS — E785 Hyperlipidemia, unspecified: Secondary | ICD-10-CM | POA: Diagnosis not present

## 2017-06-19 DIAGNOSIS — Z79899 Other long term (current) drug therapy: Secondary | ICD-10-CM | POA: Diagnosis not present

## 2017-06-19 DIAGNOSIS — E119 Type 2 diabetes mellitus without complications: Secondary | ICD-10-CM | POA: Diagnosis not present

## 2017-06-19 DIAGNOSIS — I10 Essential (primary) hypertension: Secondary | ICD-10-CM | POA: Diagnosis not present

## 2017-07-16 DIAGNOSIS — I10 Essential (primary) hypertension: Secondary | ICD-10-CM | POA: Diagnosis not present

## 2017-07-16 DIAGNOSIS — E119 Type 2 diabetes mellitus without complications: Secondary | ICD-10-CM | POA: Diagnosis not present

## 2017-07-16 DIAGNOSIS — E785 Hyperlipidemia, unspecified: Secondary | ICD-10-CM | POA: Diagnosis not present

## 2017-07-16 DIAGNOSIS — I2510 Atherosclerotic heart disease of native coronary artery without angina pectoris: Secondary | ICD-10-CM | POA: Diagnosis not present

## 2017-08-26 ENCOUNTER — Ambulatory Visit (INDEPENDENT_AMBULATORY_CARE_PROVIDER_SITE_OTHER): Payer: Medicare Other | Admitting: Orthopaedic Surgery

## 2017-08-26 DIAGNOSIS — Z96641 Presence of right artificial hip joint: Secondary | ICD-10-CM | POA: Diagnosis not present

## 2017-08-26 NOTE — Progress Notes (Signed)
Office Visit Note   Patient: Jimmy Knox           Date of Birth: 28-Feb-1932           MRN: [id number] Visit Date: 08/26/2017              Requested by: Asencion Noble, MD [address] [place], [place] [address] PCP: Asencion Noble, MD   Assessment & Plan: Visit Diagnoses:  1. Status post right hip replacement     Plan: One year status post primary right total hip replacement without complaints  Follow-Up Instructions: Return if symptoms worsen or fail to improve.   Orders:  No orders of the defined types were placed in this encounter.  No orders of the defined types were placed in this encounter.     Procedures: No procedures performed   Clinical Data: No additional findings.   Subjective: No chief complaint on file. Jimmy Knox relates that he is doing exceptionally well in terms of his right total hip replacement. He doesn't use any ambulatory aid. Doesn't experience any limp. Not experiencing any pain. He denies shortness of breath chest pain fever or chills. Not had any distal edema or hip pain. Denies any back discomfort or trouble with his left  HPI  Review of Systems   Objective: Vital Signs: There were no vitals taken for this visit.  Physical Exam  Ortho Exam walks without a limp. Painless range of motion with internal/external rotation right hip. Mild loss of the internal/external rotation but without pain. Great leg raise negative. Painless range of motion left hip. No localized areas of tenderness about the left hip. Crossable tenderness lumbar spine. Neurovascular exam intact distally No specialty comments available.  Imaging: No results found.   PMFS History: Patient Active Problem List   Diagnosis Date Noted  . Primary osteoarthritis of right hip 07/29/2016  . Hypotension 07/29/2016  . Chronic combined systolic and diastolic CHF (congestive heart failure) ([place]) 07/29/2016  . S/P total hip arthroplasty 07/29/2016  .  Post-operative state   . Postprocedural hypotension   . Cardiomyopathy, ischemic 10/12/2013  . Carotid artery stenosis 10/12/2013  . Cerebrovascular disease 02/14/2013  . Arteriosclerotic cardiovascular disease (ASCVD)   . Essential hypertension   . Hyperlipidemia   . Fasting hyperglycemia    Past Medical History:  Diagnosis Date  . Arthritis    "right hip" (07/31/2016)  . Carotid artery disease ([place])    a. <50% bilaterally 05/2016.  Marland Kitchen Chronic combined systolic and diastolic CHF (congestive heart failure) ([place])   . Complication of anesthesia    "didn't wake up well after OR on 07/30/2016; took 2-3 to hold me down"  . Coronary artery disease    a. prior MI/stent around 2000. b. Nuc 09/2015 -> abnl, mgmd medically.  Marland Kitchen History of blood transfusion 07/30/2016   "after the operation"  . Hyperlipidemia   . Hypertension   . Ischemic cardiomyopathy   . Melanoma of nose ([place])    "inside on the right side"  . Myocardial infarction ~ 2000  . Pneumonia 1935- 1970s ,"several times"  . Rocky Mountain spotted fever 1942  . Skin cancer    "on my back; arms; froze them off"  . Type 2 diabetes, diet controlled ([place])     Family History  Problem Relation Age of Onset  . Diabetes Mellitus II Brother        x2 brothers  . Alzheimer's disease Mother     Past Surgical History:  Procedure  Laterality Date  . APPENDECTOMY    . BACK SURGERY    . CATARACT EXTRACTION, BILATERAL Bilateral   . COLONOSCOPY N/A 09/28/2015   Procedure: COLONOSCOPY;  Surgeon: Rogene Houston, MD;  [facility];  Service: Endoscopy;  Laterality: N/A;  955  . CORONARY ANGIOPLASTY WITH STENT PLACEMENT  2000  . CYSTOSCOPY W/ URETERAL STENT PLACEMENT    . EYE SURGERY Right 2013   "got hit by a limb & knocked my eye out"  . JOINT REPLACEMENT    . [place]  . MELANOMA EXCISION Right    "inside my nose"  . TONSILLECTOMY    . TOTAL HIP ARTHROPLASTY Right 07/29/2016  . TOTAL HIP ARTHROPLASTY Right  07/29/2016   Procedure: TOTAL HIP ARTHROPLASTY;  Surgeon: Garald Balding, MD;  Location: [facility];  Service: Orthopedics;  Laterality: Right;   Social History   Occupational History  . Retired    Social History Main Topics  . Smoking status: Former Smoker    Packs/day: 1.00    Years: 20.00    Types: Cigarettes    Start date: 12/30/1943  . Smokeless tobacco: Never Used     Comment: "stopped smoking in 1970 when I got pneumonia"  . Alcohol use No  . Drug use: No  . Sexual activity: No     Garald Balding, MD   Note - This record has been created using [employer].  Chart creation errors have been sought, but may not always  have been located. Such creation errors do not reflect on  the standard of medical care.

## 2017-08-27 ENCOUNTER — Other Ambulatory Visit (INDEPENDENT_AMBULATORY_CARE_PROVIDER_SITE_OTHER): Payer: Self-pay

## 2017-08-27 DIAGNOSIS — M25551 Pain in right hip: Secondary | ICD-10-CM

## 2017-09-02 ENCOUNTER — Ambulatory Visit (INDEPENDENT_AMBULATORY_CARE_PROVIDER_SITE_OTHER): Payer: Medicare Other | Admitting: Cardiovascular Disease

## 2017-09-02 ENCOUNTER — Encounter: Payer: Self-pay | Admitting: Cardiovascular Disease

## 2017-09-02 VITALS — BP 112/68 | HR 90 | Ht 72.0 in | Wt 213.0 lb

## 2017-09-02 DIAGNOSIS — R5383 Other fatigue: Secondary | ICD-10-CM | POA: Diagnosis not present

## 2017-09-02 DIAGNOSIS — R0609 Other forms of dyspnea: Secondary | ICD-10-CM

## 2017-09-02 DIAGNOSIS — E7800 Pure hypercholesterolemia, unspecified: Secondary | ICD-10-CM | POA: Diagnosis not present

## 2017-09-02 DIAGNOSIS — I10 Essential (primary) hypertension: Secondary | ICD-10-CM | POA: Diagnosis not present

## 2017-09-02 DIAGNOSIS — I255 Ischemic cardiomyopathy: Secondary | ICD-10-CM

## 2017-09-02 DIAGNOSIS — I779 Disorder of arteries and arterioles, unspecified: Secondary | ICD-10-CM | POA: Diagnosis not present

## 2017-09-02 DIAGNOSIS — G4730 Sleep apnea, unspecified: Secondary | ICD-10-CM

## 2017-09-02 DIAGNOSIS — I25708 Atherosclerosis of coronary artery bypass graft(s), unspecified, with other forms of angina pectoris: Secondary | ICD-10-CM

## 2017-09-02 DIAGNOSIS — I252 Old myocardial infarction: Secondary | ICD-10-CM

## 2017-09-02 DIAGNOSIS — R400 Somnolence: Secondary | ICD-10-CM

## 2017-09-02 DIAGNOSIS — I739 Peripheral vascular disease, unspecified: Secondary | ICD-10-CM

## 2017-09-02 NOTE — Addendum Note (Signed)
Addended by: Levonne Hubert on: 09/02/2017 10:07 AM   Modules accepted: Orders

## 2017-09-02 NOTE — Patient Instructions (Signed)
Medication Instructions:  Your physician recommends that you continue on your current medications as directed. Please refer to the Current Medication list given to you today.   Labwork: none  Testing/Procedures: none  Follow-Up: Your physician wants you to follow-up in: 32months. You will receive a reminder letter in the mail two months in advance. If you don't receive a letter, please call our office to schedule the follow-up appointment.   Any Other Special Instructions Will Be Listed Below (If Applicable).  Please call us once you have made your mind up about having a sleep study.   If you need a refill on your cardiac medications before your next appointment, please call your pharmacy.

## 2017-09-02 NOTE — Progress Notes (Signed)
SUBJECTIVE: The patient presents for follow-up of coronary artery disease. Nuclear stress test on 10/17/15 showed a large area of myocardial scar primarily in the inferior, inferoseptal, and inferolateral walls extending from the apex to the base. There was a mild degree of peri-infarct ischemia, calculated LVEF 42%. It was an intermediate risk study. He has a h/o CAD with a prior MI and stent around 2000, carotid artery disease, ischemic cardiomyopathy, essential HTN, and hyperlipidemia.   Echocardiogram 07/30/16 had poor endocardial definition even with contrast enhancement, with septal and apical hypokinesis. Overall LVEF was deemed "probably low normal". Mild LVH was also noted.  Carotid Dopplers 06/20/16 less than 50% internal carotid artery stenosis bilaterally.  He denies exertional chest pain. He has some exertional dyspnea after walking 50 yards. His wife tells me that he stops breathing in his sleep and also snores. He does have daytime somnolence.  ECG performed in the office today which I ordered and personally interpreted demonstrated sinus rhythm with an isolated PVC, nonspecific T-wave abnormalities, possible LVH, old inferior wall and old anterolateral wall infarcts.  SocHx: Married for 58 years this past June 2018. Has 2 children and 3 granddaughters who live fairly close to them.  Review of Systems: As per "subjective", otherwise negative.  No Known Allergies  Current Outpatient Prescriptions  Medication Sig Dispense Refill  . amitriptyline (ELAVIL) 50 MG tablet Take 50 mg by mouth at bedtime.    . Cholecalciferol (VITAMIN D3) 2000 units capsule Take 2,000 Units by mouth daily.    Marland Kitchen losartan (COZAAR) 25 MG tablet TAKE 1 TABLET(25 MG) BY MOUTH DAILY 90 tablet 3  . metoprolol succinate (TOPROL-XL) 25 MG 24 hr tablet Take 25 mg by mouth daily.    . simvastatin (ZOCOR) 40 MG tablet Take 20 mg by mouth every evening.      No current facility-administered medications  for this visit.     Past Medical History:  Diagnosis Date  . Arthritis    "right hip" (07/31/2016)  . Carotid artery disease ([place])    a. <50% bilaterally 05/2016.  Marland Kitchen Chronic combined systolic and diastolic CHF (congestive heart failure) ([place])   . Complication of anesthesia    "didn't wake up well after OR on 07/30/2016; took 2-3 to hold me down"  . Coronary artery disease    a. prior MI/stent around 2000. b. Nuc 09/2015 -> abnl, mgmd medically.  Marland Kitchen History of blood transfusion 07/30/2016   "after the operation"  . Hyperlipidemia   . Hypertension   . Ischemic cardiomyopathy   . Melanoma of nose ([place])    "inside on the right side"  . Myocardial infarction ([place]) ~ 2000  . Pneumonia 1935- 1970s ,"several times"  . Rocky Mountain spotted fever 1942  . Skin cancer    "on my back; arms; froze them off"  . Type 2 diabetes, diet controlled ([place])     Past Surgical History:  Procedure Laterality Date  . APPENDECTOMY    . BACK SURGERY    . CATARACT EXTRACTION, BILATERAL Bilateral   . COLONOSCOPY N/A 09/28/2015   Procedure: COLONOSCOPY;  Surgeon: Rogene Houston, MD;  [facility];  Service: Endoscopy;  Laterality: N/A;  955  . CORONARY ANGIOPLASTY WITH STENT PLACEMENT  2000  . CYSTOSCOPY W/ URETERAL STENT PLACEMENT    . EYE SURGERY Right 2013   "got hit by a limb & knocked my eye out"  . JOINT REPLACEMENT    . LUMBAR DISC SURGERY  [place] Right    "inside my nose"  . TONSILLECTOMY    . TOTAL HIP ARTHROPLASTY Right 07/29/2016  . TOTAL HIP ARTHROPLASTY Right 07/29/2016   Procedure: TOTAL HIP ARTHROPLASTY;  Surgeon: Garald Balding, MD;  Location: [facility];  Service: Orthopedics;  Laterality: Right;    Social History   Social History  . Marital status: Married    Spouse name: N/A  . Number of children: N/A  . Years of education: N/A   Occupational History  . Retired    Social History Main Topics  . Smoking status: Former Smoker    Packs/day: 1.00     Years: 20.00    Types: Cigarettes    Start date: 12/30/1943  . Smokeless tobacco: Never Used     Comment: "stopped smoking in 1970 when I got pneumonia"  . Alcohol use No  . Drug use: No  . Sexual activity: No   Other Topics Concern  . Not on file   Social History Narrative  . No narrative on file     Vitals:   09/02/17 0842  BP: 112/68  Pulse: 90  SpO2: 97%  Weight: 213 lb (96.6 kg)  Height: 6' (1.829 m)    Wt Readings from Last 3 Encounters:  09/02/17 213 lb (96.6 kg)  02/25/17 209 lb (94.8 kg)  12/17/16 215 lb (97.5 kg)     PHYSICAL EXAM General: NAD HEENT: Normal. Neck: No JVD, no thyromegaly. Lungs: Clear to auscultation bilaterally with normal respiratory effort. CV: Nondisplaced PMI.  Regular rate and rhythm, normal S1/S2, no S3/S4, soft 1/6 apical holosystolic murmur. No pretibial or periankle edema.  No carotid bruit.   Abdomen: Soft, nontender, no distention.  Neurologic: Alert and oriented.  Psych: Normal affect. Skin: Normal. Musculoskeletal: No gross deformities.    ECG: Most recent ECG reviewed.   Labs: Lab Results  Component Value Date/Time   K 3.7 08/01/2016 03:29 AM   BUN 16 08/01/2016 03:29 AM   CREATININE 1.04 08/01/2016 03:29 AM   ALT 23 07/21/2016 01:50 PM   HGB 10.5 (L) 08/01/2016 03:29 AM     Lipids: No results found for: LDLCALC, LDLDIRECT, CHOL, TRIG, HDL     ASSESSMENT AND PLAN:  1. CAD with prior MI and ischemic cardiomyopathy: Stable NYHA class II symptoms. Lexiscan Cardiolite stress test results noted above with primarily myocardial scar seen, mild peri-infarct ischemia. Will continue current therapy.   2. Essential HTN: Controlled. No changes.  3. Hyperlipidemia: On simvastatin. LDL 68 on 06/19/17. No changes.  4. Carotid artery disease: Carotid Dopplers 06/20/16 less than 50% internal carotid artery stenosis bilaterally. Would repeat in 2+ years.  5. Daytime somnolence with possible sleep-disordered breathing: I  have recommended a sleep study. He may have central sleep apnea. His wife recommends he get it done but he defers. I asked him to call me back if he changes his mind. I did inform him of the risks associated with sleep apnea including MI, CVA, and arrhythmias.     Disposition: Follow up 6 months.   Kate Sable, M.D., F.A.C.C.

## 2017-09-15 ENCOUNTER — Other Ambulatory Visit: Payer: Self-pay | Admitting: Cardiovascular Disease

## 2017-09-16 DIAGNOSIS — H47231 Glaucomatous optic atrophy, right eye: Secondary | ICD-10-CM | POA: Diagnosis not present

## 2017-09-16 DIAGNOSIS — H1811 Bullous keratopathy, right eye: Secondary | ICD-10-CM | POA: Diagnosis not present

## 2017-10-14 DIAGNOSIS — Z23 Encounter for immunization: Secondary | ICD-10-CM | POA: Diagnosis not present

## 2017-12-17 ENCOUNTER — Other Ambulatory Visit: Payer: Self-pay | Admitting: Cardiovascular Disease

## 2017-12-24 DIAGNOSIS — H47231 Glaucomatous optic atrophy, right eye: Secondary | ICD-10-CM | POA: Diagnosis not present

## 2017-12-24 DIAGNOSIS — H1811 Bullous keratopathy, right eye: Secondary | ICD-10-CM | POA: Diagnosis not present

## 2018-01-13 DIAGNOSIS — E119 Type 2 diabetes mellitus without complications: Secondary | ICD-10-CM | POA: Diagnosis not present

## 2018-01-20 DIAGNOSIS — B356 Tinea cruris: Secondary | ICD-10-CM | POA: Diagnosis not present

## 2018-01-20 DIAGNOSIS — I2510 Atherosclerotic heart disease of native coronary artery without angina pectoris: Secondary | ICD-10-CM | POA: Diagnosis not present

## 2018-01-20 DIAGNOSIS — E119 Type 2 diabetes mellitus without complications: Secondary | ICD-10-CM | POA: Diagnosis not present

## 2018-01-20 DIAGNOSIS — S83402A Sprain of unspecified collateral ligament of left knee, initial encounter: Secondary | ICD-10-CM | POA: Diagnosis not present

## 2018-01-27 ENCOUNTER — Ambulatory Visit (INDEPENDENT_AMBULATORY_CARE_PROVIDER_SITE_OTHER): Payer: Medicare Other

## 2018-01-27 ENCOUNTER — Encounter (INDEPENDENT_AMBULATORY_CARE_PROVIDER_SITE_OTHER): Payer: Self-pay | Admitting: Orthopaedic Surgery

## 2018-01-27 ENCOUNTER — Ambulatory Visit (INDEPENDENT_AMBULATORY_CARE_PROVIDER_SITE_OTHER): Payer: Medicare Other | Admitting: Orthopaedic Surgery

## 2018-01-27 DIAGNOSIS — M25562 Pain in left knee: Secondary | ICD-10-CM

## 2018-01-27 MED ORDER — BUPIVACAINE HCL 0.5 % IJ SOLN
2.0000 mL | INTRAMUSCULAR | Status: AC | PRN
  Administered 2018-01-27: 2 mL via INTRA_ARTICULAR

## 2018-01-27 MED ORDER — LIDOCAINE HCL 1 % IJ SOLN
2.0000 mL | INTRAMUSCULAR | Status: AC | PRN
  Administered 2018-01-27: 2 mL

## 2018-01-27 MED ORDER — METHYLPREDNISOLONE ACETATE 40 MG/ML IJ SUSP
80.0000 mg | INTRAMUSCULAR | Status: AC | PRN
  Administered 2018-01-27: 80 mg

## 2018-01-27 NOTE — Progress Notes (Signed)
Office Visit Note   Patient: Jimmy Knox           Date of Birth: 1932/06/09           MRN: [id number] Visit Date: 01/27/2018              Requested by: Asencion Noble, MD [address] [place], [place] [address] PCP: Asencion Noble, MD   Assessment & Plan: Visit Diagnoses:  1. Acute pain of left knee     Plan: acute onset of left knee pain approximately 3 weeks ago. He was trying to push her ROM from beneath a piece of furniture and felt a pop in his knee. Since then he's had some swelling and pain predominantly along the medial and lateral aspect of his knee. No feeling of instability. He's tried ice and heat and elevation even some ibuprofen. Film some degenerative changes in all 3 compartments are relatively mild but mostly laterally. We'll try a cortisone injection and reevaluate in 3-4 weeks if no improvement  Follow-Up Instructions: Return if symptoms worsen or fail to improve.   Orders:  Orders Placed This Encounter  Procedures  . XR KNEE 3 VIEW LEFT   No orders of the defined types were placed in this encounter.     Procedures: Large Joint Inj: L knee on 01/27/2018 11:25 AM Indications: pain and diagnostic evaluation Details: 25 G 1.5 in needle, anteromedial approach  Arthrogram: No  Medications: 2 mL lidocaine 1 %; 2 mL bupivacaine 0.5 %; 80 mg methylPREDNISolone acetate 40 MG/ML Procedure, treatment alternatives, risks and benefits explained, specific risks discussed. Consent was given by the patient. Patient was prepped and draped in the usual sterile fashion.       Clinical Data: No additional findings.   Subjective: No chief complaint on file. acute onset of left knee pain 3 weeks ago while trying to push is brought out from underneath the piece of furniture. Had immediate onset of pain. Now having recurrent swelling and achiness and some discomfort. No instability. No referred pain from hip or back.  HPI  Review of  Systems   Objective: Vital Signs: There were no vitals taken for this visit.  Physical Exam  Ortho Exam Awake alert and oriented 3. Comfortable sitting. Straight leg raise negative bilaterally. Painless range of motion left hip. Very minimal effusion left knee. She spurring minimal tenderness along the medial lateral joint. No instability. No pain along the LCL or MCL. Minimal patellar crepitation. Full extension and flexion over 105. No popliteal pain. No calf discomfort. No swelling distally. Specialty Comments:  No specialty comments available.  Imaging: Xr Knee 3 View Left  Result Date: 01/27/2018 X-rays left knee were obtained in 3 projections standing. Normal alignment. Some irregularity along the lateral joint line consistent with osteoarthritis. No acute changes or evidence of fracture. No ectopic calcification. Films are consistent with mild to moderate osteoarthritis without acute change    PMFS History: Patient Active Problem List   Diagnosis Date Noted  . Primary osteoarthritis of right hip 07/29/2016  . Hypotension 07/29/2016  . Chronic combined systolic and diastolic CHF (congestive heart failure) ([place]) 07/29/2016  . S/P total hip arthroplasty 07/29/2016  . Post-operative state   . Postprocedural hypotension   . Cardiomyopathy, ischemic 10/12/2013  . Carotid artery stenosis 10/12/2013  . Cerebrovascular disease 02/14/2013  . Arteriosclerotic cardiovascular disease (ASCVD)   . Essential hypertension   . Hyperlipidemia   . Fasting hyperglycemia    Past Medical History:  Diagnosis  Date  . Arthritis    "right hip" (07/31/2016)  . Carotid artery disease ([place])    a. <50% bilaterally 05/2016.  Marland Kitchen Chronic combined systolic and diastolic CHF (congestive heart failure) ([place])   . Complication of anesthesia    "didn't wake up well after OR on 07/30/2016; took 2-3 to hold me down"  . Coronary artery disease    a. prior MI/stent around 2000. b. Nuc 09/2015 -> abnl, mgmd  medically.  Marland Kitchen History of blood transfusion 07/30/2016   "after the operation"  . Hyperlipidemia   . Hypertension   . Ischemic cardiomyopathy   . Melanoma of nose ([place])    "inside on the right side"  . Myocardial infarction ([place]) ~ 2000  . Pneumonia 1935- 1970s ,"several times"  . Rocky Mountain spotted fever 1942  . Skin cancer    "on my back; arms; froze them off"  . Type 2 diabetes, diet controlled ([place])     Family History  Problem Relation Age of Onset  . Diabetes Mellitus II Brother        x2 brothers  . Alzheimer's disease Mother     Past Surgical History:  Procedure Laterality Date  . APPENDECTOMY    . BACK SURGERY    . CATARACT EXTRACTION, BILATERAL Bilateral   . COLONOSCOPY N/A 09/28/2015   Procedure: COLONOSCOPY;  Surgeon: Rogene Houston, MD;  [facility];  Service: Endoscopy;  Laterality: N/A;  955  . CORONARY ANGIOPLASTY WITH STENT PLACEMENT  2000  . CYSTOSCOPY W/ URETERAL STENT PLACEMENT    . EYE SURGERY Right 2013   "got hit by a limb & knocked my eye out"  . JOINT REPLACEMENT    . [place]  . MELANOMA EXCISION Right    "inside my nose"  . TONSILLECTOMY    . TOTAL HIP ARTHROPLASTY Right 07/29/2016  . TOTAL HIP ARTHROPLASTY Right 07/29/2016   Procedure: TOTAL HIP ARTHROPLASTY;  Surgeon: Garald Balding, MD;  Location: [facility];  Service: Orthopedics;  Laterality: Right;   Social History   Occupational History  . Occupation: Retired  Tobacco Use  . Smoking status: Former Smoker    Packs/day: 1.00    Years: 20.00    Pack years: 20.00    Types: Cigarettes    Start date: 12/30/1943  . Smokeless tobacco: Never Used  . Tobacco comment: "stopped smoking in 1970 when I got pneumonia"  Substance and Sexual Activity  . Alcohol use: No    Alcohol/week: 0.0 oz  . Drug use: No  . Sexual activity: No     Garald Balding, MD   Note - This record has been created using [employer].  Chart creation errors have been sought, but  may not always  have been located. Such creation errors do not reflect on  the standard of medical care.

## 2018-04-01 ENCOUNTER — Encounter (HOSPITAL_COMMUNITY): Payer: Self-pay | Admitting: Emergency Medicine

## 2018-04-01 ENCOUNTER — Emergency Department (HOSPITAL_COMMUNITY): Payer: Medicare Other

## 2018-04-01 ENCOUNTER — Other Ambulatory Visit: Payer: Self-pay

## 2018-04-01 ENCOUNTER — Emergency Department (HOSPITAL_COMMUNITY)
Admission: EM | Admit: 2018-04-01 | Discharge: 2018-04-01 | Disposition: A | Discharge status: Home / Self Care | Payer: Medicare Other | Attending: Emergency Medicine | Admitting: Emergency Medicine

## 2018-04-01 DIAGNOSIS — Y9301 Activity, walking, marching and hiking: Secondary | ICD-10-CM | POA: Insufficient documentation

## 2018-04-01 DIAGNOSIS — S4992XA Unspecified injury of left shoulder and upper arm, initial encounter: Secondary | ICD-10-CM | POA: Diagnosis not present

## 2018-04-01 DIAGNOSIS — S199XXA Unspecified injury of neck, initial encounter: Secondary | ICD-10-CM | POA: Diagnosis not present

## 2018-04-01 DIAGNOSIS — E119 Type 2 diabetes mellitus without complications: Secondary | ICD-10-CM | POA: Diagnosis not present

## 2018-04-01 DIAGNOSIS — W010XXA Fall on same level from slipping, tripping and stumbling without subsequent striking against object, initial encounter: Secondary | ICD-10-CM | POA: Insufficient documentation

## 2018-04-01 DIAGNOSIS — S0083XA Contusion of other part of head, initial encounter: Secondary | ICD-10-CM | POA: Diagnosis not present

## 2018-04-01 DIAGNOSIS — Z96641 Presence of right artificial hip joint: Secondary | ICD-10-CM | POA: Diagnosis not present

## 2018-04-01 DIAGNOSIS — M25512 Pain in left shoulder: Secondary | ICD-10-CM | POA: Diagnosis not present

## 2018-04-01 DIAGNOSIS — Z23 Encounter for immunization: Secondary | ICD-10-CM | POA: Insufficient documentation

## 2018-04-01 DIAGNOSIS — I5042 Chronic combined systolic (congestive) and diastolic (congestive) heart failure: Secondary | ICD-10-CM | POA: Insufficient documentation

## 2018-04-01 DIAGNOSIS — Y999 Unspecified external cause status: Secondary | ICD-10-CM | POA: Diagnosis not present

## 2018-04-01 DIAGNOSIS — S79912A Unspecified injury of left hip, initial encounter: Secondary | ICD-10-CM | POA: Diagnosis not present

## 2018-04-01 DIAGNOSIS — Z87891 Personal history of nicotine dependence: Secondary | ICD-10-CM | POA: Diagnosis not present

## 2018-04-01 DIAGNOSIS — S0190XA Unspecified open wound of unspecified part of head, initial encounter: Secondary | ICD-10-CM | POA: Diagnosis not present

## 2018-04-01 DIAGNOSIS — Y92512 Supermarket, store or market as the place of occurrence of the external cause: Secondary | ICD-10-CM | POA: Insufficient documentation

## 2018-04-01 DIAGNOSIS — L570 Actinic keratosis: Secondary | ICD-10-CM | POA: Diagnosis not present

## 2018-04-01 DIAGNOSIS — Z79899 Other long term (current) drug therapy: Secondary | ICD-10-CM | POA: Diagnosis not present

## 2018-04-01 DIAGNOSIS — X32XXXD Exposure to sunlight, subsequent encounter: Secondary | ICD-10-CM | POA: Diagnosis not present

## 2018-04-01 DIAGNOSIS — C44629 Squamous cell carcinoma of skin of left upper limb, including shoulder: Secondary | ICD-10-CM | POA: Diagnosis not present

## 2018-04-01 DIAGNOSIS — M25511 Pain in right shoulder: Secondary | ICD-10-CM | POA: Diagnosis not present

## 2018-04-01 DIAGNOSIS — S0990XA Unspecified injury of head, initial encounter: Secondary | ICD-10-CM | POA: Diagnosis not present

## 2018-04-01 DIAGNOSIS — S0181XA Laceration without foreign body of other part of head, initial encounter: Secondary | ICD-10-CM | POA: Insufficient documentation

## 2018-04-01 DIAGNOSIS — I110 Hypertensive heart disease with heart failure: Secondary | ICD-10-CM | POA: Diagnosis not present

## 2018-04-01 DIAGNOSIS — C4402 Squamous cell carcinoma of skin of lip: Secondary | ICD-10-CM | POA: Diagnosis not present

## 2018-04-01 DIAGNOSIS — L820 Inflamed seborrheic keratosis: Secondary | ICD-10-CM | POA: Diagnosis not present

## 2018-04-01 DIAGNOSIS — S79911A Unspecified injury of right hip, initial encounter: Secondary | ICD-10-CM | POA: Diagnosis not present

## 2018-04-01 DIAGNOSIS — I2510 Atherosclerotic heart disease of native coronary artery without angina pectoris: Secondary | ICD-10-CM | POA: Insufficient documentation

## 2018-04-01 DIAGNOSIS — W19XXXA Unspecified fall, initial encounter: Secondary | ICD-10-CM

## 2018-04-01 DIAGNOSIS — S098XXA Other specified injuries of head, initial encounter: Secondary | ICD-10-CM | POA: Diagnosis not present

## 2018-04-01 DIAGNOSIS — S4991XA Unspecified injury of right shoulder and upper arm, initial encounter: Secondary | ICD-10-CM | POA: Diagnosis not present

## 2018-04-01 MED ORDER — LIDOCAINE HCL (PF) 2 % IJ SOLN
10.0000 mL | Freq: Once | INTRAMUSCULAR | Status: AC
  Administered 2018-04-01: 22:00:00

## 2018-04-01 MED ORDER — TETANUS-DIPHTH-ACELL PERTUSSIS 5-2.5-18.5 LF-MCG/0.5 IM SUSP
0.5000 mL | Freq: Once | INTRAMUSCULAR | Status: AC
  Administered 2018-04-01: 0.5 mL via INTRAMUSCULAR
  Filled 2018-04-01: qty 0.5

## 2018-04-01 MED ORDER — POVIDONE-IODINE 10 % EX SOLN
CUTANEOUS | Status: AC
  Administered 2018-04-01: 2
  Filled 2018-04-01: qty 30

## 2018-04-01 MED ORDER — LIDOCAINE HCL (PF) 2 % IJ SOLN
INTRAMUSCULAR | Status: AC
  Filled 2018-04-01: qty 20

## 2018-04-01 NOTE — ED Notes (Signed)
Patient is alert and oriented x4. Complains of no pain. Tdap given. Sips of water given.

## 2018-04-01 NOTE — ED Notes (Signed)
Pt to CT at this time.

## 2018-04-01 NOTE — ED Provider Notes (Signed)
[facility] EMERGENCY DEPARTMENT Provider Note   CSN: [id number] Arrival date & time: 04/01/18  1846     History   Chief Complaint Chief Complaint  Patient presents with  . Fall  . Head Injury    HPI Jimmy Knox is a 84 y.o. male.  HPI  Pt was seen at [place]. Per pt and his wife, c/o sudden onset and resolution of one episode of trip and fall that occurred PTA. Pt's wife states pt was in a store and tripped over something on the floor. Pt sustained laceration to left forehead. Pt was ambulatory directly after and since the fall. Pt denies any other complaints. Denies LOC, no AMS, no CP/SOB, no abd pain, no N/V/D, no focal motor weakness, no tingling/numbness in extremities, no prodromal symptoms before fall.    Unk Td Past Medical History:  Diagnosis Date  . Arthritis    "right hip" (07/31/2016)  . Carotid artery disease ([place])    a. <50% bilaterally 05/2016.  Marland Kitchen Chronic combined systolic and diastolic CHF (congestive heart failure) ([place])   . Complication of anesthesia    "didn't wake up well after OR on 07/30/2016; took 2-3 to hold me down"  . Coronary artery disease    a. prior MI/stent around 2000. b. Nuc 09/2015 -> abnl, mgmd medically.  Marland Kitchen History of blood transfusion 07/30/2016   "after the operation"  . Hyperlipidemia   . Hypertension   . Ischemic cardiomyopathy   . Melanoma of nose ([place])    "inside on the right side"  . Myocardial infarction ([place]) ~ 2000  . Pneumonia 1935- 1970s ,"several times"  . [place] spotted fever 1942  . Skin cancer    "on my back; arms; froze them off"  . Type 2 diabetes, diet controlled [facility])     Patient Active Problem List   Diagnosis Date Noted  . Primary osteoarthritis of right hip 07/29/2016  . Hypotension 07/29/2016  . Chronic combined systolic and diastolic CHF (congestive heart failure) ([place]) 07/29/2016  . S/P total hip arthroplasty 07/29/2016  . Post-operative state   . Postprocedural hypotension   .  Cardiomyopathy, ischemic 10/12/2013  . Carotid artery stenosis 10/12/2013  . Cerebrovascular disease 02/14/2013  . Arteriosclerotic cardiovascular disease (ASCVD)   . Essential hypertension   . Hyperlipidemia   . Fasting hyperglycemia     Past Surgical History:  Procedure Laterality Date  . APPENDECTOMY    . BACK SURGERY    . CATARACT EXTRACTION, BILATERAL Bilateral   . COLONOSCOPY N/A 09/28/2015   Procedure: COLONOSCOPY;  Surgeon: Rogene Houston, MD;  Location: [facility];  Service: Endoscopy;  Laterality: N/A;  955  . CORONARY ANGIOPLASTY WITH STENT PLACEMENT  2000  . CYSTOSCOPY W/ URETERAL STENT PLACEMENT    . EYE SURGERY Right 2013   "got hit by a limb & knocked my eye out"  . JOINT REPLACEMENT    . [place]  . MELANOMA EXCISION Right    "inside my nose"  . TONSILLECTOMY    . TOTAL HIP ARTHROPLASTY Right 07/29/2016  . TOTAL HIP ARTHROPLASTY Right 07/29/2016   Procedure: TOTAL HIP ARTHROPLASTY;  Surgeon: Garald Balding, MD;  Location: [place];  Service: Orthopedics;  Laterality: Right;        Home Medications    Prior to Admission medications   Medication Sig Start Date End Date Taking? Authorizing Provider  amitriptyline (ELAVIL) 50 MG tablet Take 50 mg by mouth at bedtime.  [provider]  Cholecalciferol (VITAMIN D3) 2000 units capsule Take 2,000 Units by mouth daily.    [provider]  losartan (COZAAR) 25 MG tablet TAKE 1 TABLET BY MOUTH ONCE DAILY. 12/17/17   Herminio Commons, MD  metoprolol succinate (TOPROL-XL) 25 MG 24 hr tablet Take 25 mg by mouth daily.    [provider]  simvastatin (ZOCOR) 40 MG tablet Take 20 mg by mouth every evening.     [provider]    Family History Family History  Problem Relation Age of Onset  . Diabetes Mellitus II Brother        x2 brothers  . Alzheimer's disease Mother     Social History Social History   Tobacco Use  . Smoking status: Former Smoker     Packs/day: 1.00    Years: 20.00    Pack years: 20.00    Types: Cigarettes    Start date: 12/30/1943  . Smokeless tobacco: Never Used  . Tobacco comment: "stopped smoking in 1970 when I got pneumonia"  Substance Use Topics  . Alcohol use: No    Alcohol/week: 0.0 oz  . Drug use: No     Allergies   Patient has no known allergies.   Review of Systems Review of Systems ROS: Statement: All systems negative except as marked or noted in the HPI; Constitutional: Negative for fever and chills. ; ; Eyes: Negative for eye pain, redness and discharge. ; ; ENMT: Negative for ear pain, hoarseness, nasal congestion, sinus pressure and sore throat. ; ; Cardiovascular: Negative for chest pain, palpitations, diaphoresis, dyspnea and peripheral edema. ; ; Respiratory: Negative for cough, wheezing and stridor. ; ; Gastrointestinal: Negative for nausea, vomiting, diarrhea, abdominal pain, blood in stool, hematemesis, jaundice and rectal bleeding. . ; ; Genitourinary: Negative for dysuria, flank pain and hematuria. ; ; Musculoskeletal: +head injury. Negative for back pain and neck pain. Negative for swelling and deformity.; ; Skin: +forehead laceration. Negative for pruritus, rash, abrasions, blisters, bruising and skin lesion.; ; Neuro: Negative for headache, lightheadedness and neck stiffness. Negative for weakness, altered level of consciousness, altered mental status, extremity weakness, paresthesias, involuntary movement, seizure and syncope.       Physical Exam Updated Vital Signs BP (!) 143/83 (BP Location: Right Arm)   Pulse 83   Temp 98.1 F (36.7 C) (Oral)   Resp 18   Ht 6' (1.829 m)   Wt 95.7 kg (211 lb)   SpO2 95%   BMI 28.62 kg/m   Physical Exam 1900: Physical examination: Vital signs and O2 SAT: Reviewed; Constitutional: Well developed, Well nourished, Well hydrated, In no acute distress; Head and Face: Normocephalic, No scalp hematomas, +left forehead vertical lac, hemostatic.   Non-tender to palp superior and inferior orbital rim areas.  No zygoma tenderness.  No mandibular tenderness.; Eyes: EOMI, Right pupil irregular per hx, otherwise left pupil RRL, No scleral icterus; ENMT: Mouth and pharynx normal, Left TM normal, Right TM normal, Mucous membranes moist, +teeth and tongue intact.  No intraoral or intranasal bleeding.  No septal hematomas.  No trismus, no malocclusion.; Neck: Immobilized in C-collar, Trachea midline; Spine: No midline CS, TS, LS tenderness.; Cardiovascular: Regular rate and rhythm, No gallop; Respiratory: Breath sounds clear & equal bilaterally, No wheezes, Normal respiratory effort/excursion; Chest: Nontender, No deformity, Movement normal, No crepitus, No abrasions or ecchymosis.; Abdomen: Soft, Nontender, Nondistended, Normal bowel sounds, No abrasions or ecchymosis.; Genitourinary: No CVA tenderness;.; Extremities: No deformity, Full range of motion major/large joints of  bilat UE's and LE's without pain or tenderness to palp, Neurovascularly intact, Pulses normal, No tenderness, No edema, Pelvis stable; Neuro: AA&Ox3, GCS 15.  +very HOH, otherwise major CN grossly intact. Speech clear. No gross focal motor or sensory deficits in extremities.; Skin: Color normal, Warm, Dry   ED Treatments / Results  Labs (all labs ordered are listed, but only abnormal results are displayed)   EKG None  Radiology   Procedures Procedures (including critical care time)  Medications Ordered in ED Medications  Tdap (BOOSTRIX) injection 0.5 mL (has no administration in time range)     Initial Impression / Assessment and Plan / ED Course  I have reviewed the triage vital signs and the nursing notes.  Pertinent labs & imaging results that were available during my care of the patient were reviewed by me and considered in my medical decision making (see chart for details).  MDM Reviewed: previous chart, nursing note and vitals Interpretation: x-ray and CT  scan    Dg Shoulder Right Result Date: 04/01/2018 CLINICAL DATA:  Shoulder pain, fall EXAM: RIGHT SHOULDER - 2+ VIEW COMPARISON:  None. FINDINGS: Moderate glenohumeral degenerative changes. No acute displaced fracture or dislocation. Inferior spurring of the acromion. Right lung apex is clear. IMPRESSION: Degenerative changes of the right shoulder. No definite acute osseous abnormality Electronically Signed   By: Donavan Foil M.D.   On: 04/01/2018 20:19   Ct Head Wo Contrast Result Date: 04/01/2018 CLINICAL DATA:  84 y/o M; fall with head injury, laceration to the left forehead. EXAM: CT HEAD WITHOUT CONTRAST CT CERVICAL SPINE WITHOUT CONTRAST TECHNIQUE: Multidetector CT imaging of the head and cervical spine was performed following the standard protocol without intravenous contrast. Multiplanar CT image reconstructions of the cervical spine were also generated. COMPARISON:  10/11/2009 CT of the head. FINDINGS: CT HEAD FINDINGS Brain: No evidence of acute infarction, hemorrhage, hydrocephalus, extra-axial collection or mass lesion/mass effect. Stable chronic microvascular ischemic changes and parenchymal volume loss of the brain. Vascular: Extensive calcific atherosclerosis of carotid siphons. Skull: Normal. Negative for fracture or focal lesion. Sinuses/Orbits: No acute finding. Other: None. CT CERVICAL SPINE FINDINGS Alignment: Stable grade 1 C7-T1 anterolisthesis. Stable mild reversal of cervical curvature with apex at C5. Mild cervical levocurvature with apex at C7. Skull base and vertebrae: No acute fracture. No primary bone lesion or focal pathologic process. Soft tissues and spinal canal: No prevertebral fluid or swelling. No visible canal hematoma. Disc levels: Advanced cervical spondylosis with severe discogenic degenerative changes from C4 through C7 and right greater than left facet hypertrophy. Uncovertebral and facet hypertrophy results in bony foraminal stenosis at the left C6-7 level and right  C3-4 as well as C7-T1 levels. No high-grade bony canal stenosis. Upper chest: Negative. Other: Stable calcification surround the odontoid process which may be degenerative or due to CPPD. Calcific atherosclerosis of carotid siphons. IMPRESSION: 1. No acute intracranial abnormality or calvarial fracture. 2. No acute fracture or dislocation of cervical spine. 3. Stable chronic microvascular ischemic changes and parenchymal volume loss of the brain. 4. Advanced cervical spondylosis, levocurvature, and grade 1 C7-T1 anterolisthesis. Electronically Signed   By: Kristine Garbe M.D.   On: 04/01/2018 20:18   Ct Cervical Spine Wo Contrast Result Date: 04/01/2018 CLINICAL DATA:  84 y/o M; fall with head injury, laceration to the left forehead. EXAM: CT HEAD WITHOUT CONTRAST CT CERVICAL SPINE WITHOUT CONTRAST TECHNIQUE: Multidetector CT imaging of the head and cervical spine was performed following the standard protocol without intravenous contrast. Multiplanar  CT image reconstructions of the cervical spine were also generated. COMPARISON:  10/11/2009 CT of the head. FINDINGS: CT HEAD FINDINGS Brain: No evidence of acute infarction, hemorrhage, hydrocephalus, extra-axial collection or mass lesion/mass effect. Stable chronic microvascular ischemic changes and parenchymal volume loss of the brain. Vascular: Extensive calcific atherosclerosis of carotid siphons. Skull: Normal. Negative for fracture or focal lesion. Sinuses/Orbits: No acute finding. Other: None. CT CERVICAL SPINE FINDINGS Alignment: Stable grade 1 C7-T1 anterolisthesis. Stable mild reversal of cervical curvature with apex at C5. Mild cervical levocurvature with apex at C7. Skull base and vertebrae: No acute fracture. No primary bone lesion or focal pathologic process. Soft tissues and spinal canal: No prevertebral fluid or swelling. No visible canal hematoma. Disc levels: Advanced cervical spondylosis with severe discogenic degenerative changes from C4  through C7 and right greater than left facet hypertrophy. Uncovertebral and facet hypertrophy results in bony foraminal stenosis at the left C6-7 level and right C3-4 as well as C7-T1 levels. No high-grade bony canal stenosis. Upper chest: Negative. Other: Stable calcification surround the odontoid process which may be degenerative or due to CPPD. Calcific atherosclerosis of carotid siphons. IMPRESSION: 1. No acute intracranial abnormality or calvarial fracture. 2. No acute fracture or dislocation of cervical spine. 3. Stable chronic microvascular ischemic changes and parenchymal volume loss of the brain. 4. Advanced cervical spondylosis, levocurvature, and grade 1 C7-T1 anterolisthesis. Electronically Signed   By: Kristine Garbe M.D.   On: 04/01/2018 20:18   Dg Shoulder Left Result Date: 04/01/2018 CLINICAL DATA:  [place] with shoulder pain EXAM: LEFT SHOULDER - 2+ VIEW COMPARISON:  None. FINDINGS: No dislocation is evident. Left lung apex is clear. Possible fracture lucency at the glenoid fossa. IMPRESSION: 1. No dislocation 2. Possible glenoid fossa fracture lucency. Suggest CT to further evaluate. Electronically Signed   By: Donavan Foil M.D.   On: 04/01/2018 20:15   Dg Hips Bilat With Pelvis Min 5 Views Result Date: 04/01/2018 CLINICAL DATA:  Fall EXAM: DG HIP (WITH OR WITHOUT PELVIS) 5+V BILAT COMPARISON:  07/29/2016 FINDINGS: SI joint degenerative change without widening. Pubic symphysis and rami are intact. Status post right hip replacement without fracture or dislocation. Mild to moderate arthritis of the left hip without acute fracture or malalignment IMPRESSION: Status post right hip replacement. No definite acute osseous abnormality. Electronically Signed   By: Donavan Foil M.D.   On: 04/01/2018 20:18   Ct Shoulder Left Wo Contrast Result Date: 04/01/2018 CLINICAL DATA:  Left shoulder pain status post injury EXAM: CT OF THE UPPER LEFT EXTREMITY WITHOUT CONTRAST TECHNIQUE: Multidetector CT  imaging of the upper left extremity was performed according to the standard protocol. COMPARISON:  None. FINDINGS: Bones/Joint/Cartilage No fracture or dislocation. Normal alignment. No joint effusion. Mild osteoarthritis of the left glenohumeral joint. Moderate arthropathy of the acromioclavicular joint. Type II acromion. Ligaments Ligaments are suboptimally evaluated by CT. Muscles and Tendons Muscles are normal. No muscle atrophy. No intramuscular fluid collection or hematoma. Soft tissue No fluid collection or hematoma.  No soft tissue mass. IMPRESSION: 1.  No acute osseous injury of the left shoulder. Electronically Signed   By: Kathreen Devoid   On: 04/01/2018 21:31     1900:  Pt's wife states pt fell to his left side, and is requesting XR left shoulder and hip. Pt denies pain in extremities.   1945:  Pt went to XR and told Tech now his right shoulder and hip hurt; will XR.   2200:  CT/XR as above. Lac closed  by APP.  Pt has tol PO well while in the ED without N/V. Pt wants to go home now. Tx symptomatically. Dx and testing d/w pt and family.  Questions answered.  Verb understanding, agreeable to d/c home with outpt f/u.    Final Clinical Impressions(s) / ED Diagnoses   Final diagnoses:  Fall    ED Discharge Orders    None       Francine Graven, DO 04/05/18 [id number]

## 2018-04-01 NOTE — Discharge Instructions (Signed)
Take over the counter tylenol, as directed on packaging, as needed for discomfort. Apply moist heat or ice to the area(s) of discomfort, for 15 minutes at a time, several times per day for the next few days.  Do not fall asleep on a heating or ice pack.  Your sutures are dissolvable (ie: they do not need to be taken out). Wash the sutured area gently with soap and water, and pat dry, at least twice a day, and cover with a clean/dry dressing.  Change the dressing whenever it becomes wet or soiled after washing the area with soap and water and patting dry.  Call your regular medical doctor tomorrow to schedule a follow up appointment for a recheck within the 2 to 3 days.  Return to the Emergency Department immediately if worsening.

## 2018-04-01 NOTE — ED Triage Notes (Signed)
PT states he was at [facility] this evening and tripped over a sign on the floor and tripped and fell and hit the left side of his head. Laceration noted to left forehead with small amount of bleeding. PT denies any LOC and is alert and oriented upon arrival to ED.

## 2018-04-01 NOTE — ED Provider Notes (Signed)
[place]  Patient is an 84 year old male who tripped over a sign on the floor at a local store, fell and hit his head.  He sustained a laceration to the left forehead.  I discussed with the patient the need for laceration repair.  I discussed with him the technique for the repair.  The patient gives permission for repair of the laceration to the forehead.  Patient identified by armband.  Procedural timeout taken.  The wound was cleansed with saline and Betadine.  The area was then infiltrated with 2% plain lidocaine.  After appropriate anesthetic, the wound was irrigated with saline.  Then using sterile technique the wound was repaired with 7 interrupted sutures of 6-0 Vicryl Rapide.  The wound measures 3.3 cm.  The patient tolerated the procedure without problem.   Lily Kocher, PA-C 04/01/18 2114    Francine Graven, DO 04/05/18 [phone]

## 2018-04-27 ENCOUNTER — Telehealth: Payer: Self-pay | Admitting: Cardiovascular Disease

## 2018-04-27 NOTE — Telephone Encounter (Signed)
Wife reports SOB off and on for several weeks, worsening lately.[place] Poli had two dizzy swells.DOE is more obvious. Patient sleeping more than usual, he is quite active per wife, so this is new.No weight changes, no edema noted to lower extremities

## 2018-04-27 NOTE — Telephone Encounter (Signed)
Patient's wife states that patient is having problems w/ SOB and sleeping a lot. No appointments available. Please return call. / tg

## 2018-04-28 NOTE — Telephone Encounter (Signed)
Called pt. No answer. I left message for pt to return my call.

## 2018-04-28 NOTE — Telephone Encounter (Signed)
Pt made aware of appointment this Friday @ 1:40 with Dr. [name]

## 2018-04-28 NOTE — Telephone Encounter (Signed)
How about APP availability in [place]?

## 2018-04-28 NOTE — Telephone Encounter (Signed)
The first available APP @ [address] and [place] is 05/26/18.

## 2018-04-28 NOTE — Telephone Encounter (Signed)
The first APP appointment is 05/17/2018. Pt's wife states she does not think it should wait that long.Please advise.

## 2018-04-28 NOTE — Telephone Encounter (Signed)
I can see him at 1:40 pm this Friday.

## 2018-04-28 NOTE — Telephone Encounter (Signed)
Previously recommended a sleep study for sleep disordered breathing but he deferred (would explain sleepiness). With regards to worsening exertional dyspnea, he needs to be evaluated in office. Have him see APP.

## 2018-04-30 ENCOUNTER — Encounter: Payer: Self-pay | Admitting: Cardiovascular Disease

## 2018-04-30 ENCOUNTER — Other Ambulatory Visit: Payer: Self-pay | Admitting: Cardiovascular Disease

## 2018-04-30 ENCOUNTER — Ambulatory Visit (INDEPENDENT_AMBULATORY_CARE_PROVIDER_SITE_OTHER): Payer: Medicare Other | Admitting: Cardiovascular Disease

## 2018-04-30 VITALS — BP 116/68 | HR 79 | Ht 73.0 in | Wt 210.0 lb

## 2018-04-30 DIAGNOSIS — R400 Somnolence: Secondary | ICD-10-CM

## 2018-04-30 DIAGNOSIS — I252 Old myocardial infarction: Secondary | ICD-10-CM

## 2018-04-30 DIAGNOSIS — E785 Hyperlipidemia, unspecified: Secondary | ICD-10-CM

## 2018-04-30 DIAGNOSIS — R0609 Other forms of dyspnea: Secondary | ICD-10-CM

## 2018-04-30 DIAGNOSIS — I739 Peripheral vascular disease, unspecified: Secondary | ICD-10-CM

## 2018-04-30 DIAGNOSIS — R5383 Other fatigue: Secondary | ICD-10-CM

## 2018-04-30 DIAGNOSIS — I255 Ischemic cardiomyopathy: Secondary | ICD-10-CM

## 2018-04-30 DIAGNOSIS — G4730 Sleep apnea, unspecified: Secondary | ICD-10-CM | POA: Diagnosis not present

## 2018-04-30 DIAGNOSIS — I779 Disorder of arteries and arterioles, unspecified: Secondary | ICD-10-CM | POA: Diagnosis not present

## 2018-04-30 DIAGNOSIS — I10 Essential (primary) hypertension: Secondary | ICD-10-CM | POA: Diagnosis not present

## 2018-04-30 NOTE — Progress Notes (Signed)
SUBJECTIVE: The patient presents for the evaluation of exertional dyspnea. Hehas a h/o CAD with a prior MI and stent around 2000, carotid artery disease, ischemic cardiomyopathy, essential HTN, and hyperlipidemia.   Nuclear stress test on 10/17/15 showed a large area of myocardial scar primarily in the inferior, inferoseptal, and inferolateral walls extending from the apex to the base. There was a mild degree of peri-infarct ischemia, calculated LVEF 42%. It was an intermediate risk study.  Echocardiogram 07/30/16 had poor endocardial definition even with contrast enhancement, with septal and apical hypokinesis. Overall LVEF was deemed "probably low normal". Mild LVH was also noted.  ECG performed today which I personally reviewed demonstrated sinus rhythm with old inferior and old anterolateral infarcts.  For the past 3 months he has been experiencing progressive exertional dyspnea.  It occurs with activity such as doing yard work.  Yesterday he mowed his lawn for 6 hours on a riding mower but had empty the bags and also did some weed eating.  He felt exhausted afterwards.  He denies chest pain and exertional chest tightness.  He also gets dizzy if he overexerts himself.  He experienced exertional dyspnea when walking from our parking lot to our office today.  Chest x-ray in July 2017 showed chronic pulmonary scarring.  This is the last radiographic imaging of the chest I can find in the electronic medical record.  He tripped and fell and hit his head at [facility] in April.  His forehead required 7 stitches.    SocHx: Married for 59 years this upcoming June 2019. Has 2 children and 3 granddaughters who live fairly close to them.    Review of Systems: As per "subjective", otherwise negative.  No Known Allergies  Current Outpatient Medications  Medication Sig Dispense Refill  . amitriptyline (ELAVIL) 50 MG tablet Take 50 mg by mouth at bedtime.    Marland Kitchen aspirin EC 81 MG tablet Take 81  mg by mouth daily.    . metoprolol succinate (TOPROL-XL) 25 MG 24 hr tablet Take 25 mg by mouth daily.    . simvastatin (ZOCOR) 40 MG tablet Take 20 mg by mouth every evening.     Marland Kitchen losartan (COZAAR) 25 MG tablet TAKE 1 TABLET BY MOUTH ONCE DAILY. 90 tablet 3   No current facility-administered medications for this visit.     Past Medical History:  Diagnosis Date  . Arthritis    "right hip" (07/31/2016)  . Carotid artery disease ([place])    a. <50% bilaterally 05/2016.  Marland Kitchen Chronic combined systolic and diastolic CHF (congestive heart failure) ([place])   . Complication of anesthesia    "didn't wake up well after OR on 07/30/2016; took 2-3 to hold me down"  . Coronary artery disease    a. prior MI/stent around 2000. b. Nuc 09/2015 -> abnl, mgmd medically.  Marland Kitchen History of blood transfusion 07/30/2016   "after the operation"  . Hyperlipidemia   . Hypertension   . Ischemic cardiomyopathy   . Melanoma of nose ([place])    "inside on the right side"  . Myocardial infarction ([place]) ~ 2000  . Pneumonia 1935- 1970s ,"several times"  . Rocky Mountain spotted fever 1942  . Skin cancer    "on my back; arms; froze them off"  . Type 2 diabetes, diet controlled ([place])     Past Surgical History:  Procedure Laterality Date  . APPENDECTOMY    . BACK SURGERY    . CATARACT EXTRACTION, BILATERAL Bilateral   .  COLONOSCOPY N/A 09/28/2015   Procedure: COLONOSCOPY;  Surgeon: Rogene Houston, MD;  [facility];  Service: Endoscopy;  Laterality: N/A;  955  . CORONARY ANGIOPLASTY WITH STENT PLACEMENT  2000  . CYSTOSCOPY W/ URETERAL STENT PLACEMENT    . EYE SURGERY Right 2013   "got hit by a limb & knocked my eye out"  . JOINT REPLACEMENT    . [place]  . MELANOMA EXCISION Right    "inside my nose"  . TONSILLECTOMY    . TOTAL HIP ARTHROPLASTY Right 07/29/2016  . TOTAL HIP ARTHROPLASTY Right 07/29/2016   Procedure: TOTAL HIP ARTHROPLASTY;  Surgeon: Garald Balding, MD;  Location: [facility];   Service: Orthopedics;  Laterality: Right;    Social History   Socioeconomic History  . Marital status: Married    Spouse name: Not on file  . Number of children: Not on file  . Years of education: Not on file  . Highest education level: Not on file  Occupational History  . Occupation: Retired  Scientific laboratory technician  . Financial resource strain: Not on file  . Food insecurity:    Worry: Not on file    Inability: Not on file  . Transportation needs:    Medical: Not on file    Non-medical: Not on file  Tobacco Use  . Smoking status: Former Smoker    Packs/day: 1.00    Years: 20.00    Pack years: 20.00    Types: Cigarettes    Start date: 12/30/1943  . Smokeless tobacco: Never Used  . Tobacco comment: "stopped smoking in 1970 when I got pneumonia"  Substance and Sexual Activity  . Alcohol use: No    Alcohol/week: 0.0 oz  . Drug use: No  . Sexual activity: Never  Lifestyle  . Physical activity:    Days per week: Not on file    Minutes per session: Not on file  . Stress: Not on file  Relationships  . Social connections:    Talks on phone: Not on file    Gets together: Not on file    Attends religious service: Not on file    Active member of club or organization: Not on file    Attends meetings of clubs or organizations: Not on file    Relationship status: Not on file  . Intimate partner violence:    Fear of current or ex partner: Not on file    Emotionally abused: Not on file    Physically abused: Not on file    Forced sexual activity: Not on file  Other Topics Concern  . Not on file  Social History Narrative  . Not on file     Vitals:   04/30/18 1340  BP: 116/68  Pulse: 79  SpO2: 96%  Weight: 210 lb (95.3 kg)  Height: 6\' 1"  (1.854 m)    Wt Readings from Last 3 Encounters:  04/30/18 210 lb (95.3 kg)  04/01/18 211 lb (95.7 kg)  09/02/17 213 lb (96.6 kg)     PHYSICAL EXAM General: NAD HEENT: Normal. Neck: No JVD, no thyromegaly. Lungs: Faint bilateral  diffuse crackles. CV: Regular rate and rhythm, normal S1/S2, no S3/S4, soft 1/6 apical holosystolic murmur. No pretibial or periankle edema.   Abdomen: Soft, nontender, no distention.  Neurologic: Alert and oriented.  Psych: Normal affect. Skin: Normal. Musculoskeletal: No gross deformities.    ECG: Most recent ECG reviewed.   Labs: Lab Results  Component Value Date/Time  K 3.7 08/01/2016 03:29 AM   BUN 16 08/01/2016 03:29 AM   CREATININE 1.04 08/01/2016 03:29 AM   ALT 23 07/21/2016 01:50 PM   HGB 10.5 (L) 08/01/2016 03:29 AM     Lipids: No results found for: LDLCALC, LDLDIRECT, CHOL, TRIG, HDL     ASSESSMENT AND PLAN: 1. CAD withprior MI andischemic cardiomyopathy with progressive exertional dyspnea: Lexiscan Cardiolite stress test results noted above with primarily myocardial scar seen, mild peri-infarct ischemia.  I will obtain a follow-up Lexiscan nuclear stress test to see if degree of ischemia has progressed.  I will also obtain an echocardiogram with contrast to assess for interval changes in cardiac function.  2. Essential HTN: Controlled.  No changes to therapy.  3. Hyperlipidemia: On simvastatin. LDL 68 on 06/19/17.   Continue simvastatin 40 mg.  4. Carotid artery disease: Carotid Dopplers 06/20/16 less than 50% internal carotid artery stenosis bilaterally. Would repeat in 2+years.  5. Daytime somnolence with possible sleep-disordered breathing: I previously recommended a sleep study. He may have central sleep apnea. His wife recommends he get it done but he defers. I asked him to call me back if he changes his mind. I did inform him of the risks associated with sleep apnea including MI, CVA, and arrhythmias.  6.  Progressive exertional dyspnea: Please refer to discussion in #1.  As stated above, there was chronic pulmonary scarring seen by chest x-ray in July 2017.  I will obtain a follow-up chest x-ray.  If stress testing is unremarkable, I would consider  pulmonary function testing and a pulmonary referral.     Disposition: Follow up 1 month   Kate Sable, M.D., F.A.C.C.

## 2018-04-30 NOTE — Patient Instructions (Addendum)
Your physician wants you to follow-up in:3-4 weeks  with Dr.Koneswaran      Get chest x-ray today    Your physician has requested that you have an echocardiogram. Echocardiography is a painless test that uses sound waves to create images of your heart. It provides your doctor with information about the size and shape of your heart and how well your heart's chambers and valves are working. This procedure takes approximately one hour. There are no restrictions for this procedure.   Your physician has requested that you have a lexiscan myoview. For further information please visit [email or web address]. Please follow instruction sheet, as given.    Your physician recommends that you continue on your current medications as directed. Please refer to the Current Medication list given to you today.    If you need a refill on your cardiac medications before your next appointment, please call your pharmacy.       Thank you for choosing [facility] !

## 2018-05-07 ENCOUNTER — Other Ambulatory Visit (HOSPITAL_COMMUNITY): Payer: Medicare Other

## 2018-05-10 ENCOUNTER — Encounter (HOSPITAL_COMMUNITY): Payer: Medicare Other

## 2018-05-10 ENCOUNTER — Ambulatory Visit (HOSPITAL_COMMUNITY)
Admission: RE | Admit: 2018-05-10 | Discharge: 2018-05-10 | Disposition: A | Discharge status: Home / Self Care | Payer: Medicare Other | Source: Ambulatory Visit | Attending: Cardiovascular Disease | Admitting: Cardiovascular Disease

## 2018-05-10 ENCOUNTER — Encounter (HOSPITAL_COMMUNITY): Payer: Self-pay

## 2018-05-10 DIAGNOSIS — R9439 Abnormal result of other cardiovascular function study: Secondary | ICD-10-CM | POA: Insufficient documentation

## 2018-05-10 DIAGNOSIS — E785 Hyperlipidemia, unspecified: Secondary | ICD-10-CM | POA: Insufficient documentation

## 2018-05-10 DIAGNOSIS — R0609 Other forms of dyspnea: Secondary | ICD-10-CM | POA: Diagnosis not present

## 2018-05-10 DIAGNOSIS — I110 Hypertensive heart disease with heart failure: Secondary | ICD-10-CM | POA: Insufficient documentation

## 2018-05-10 DIAGNOSIS — I080 Rheumatic disorders of both mitral and aortic valves: Secondary | ICD-10-CM | POA: Insufficient documentation

## 2018-05-10 DIAGNOSIS — I509 Heart failure, unspecified: Secondary | ICD-10-CM | POA: Diagnosis not present

## 2018-05-10 LAB — NM MYOCAR MULTI W/SPECT W/WALL MOTION / EF
CHL CUP NUCLEAR SDS: 6
CSEPPHR: 82 {beats}/min
LV dias vol: 68 mL (ref 62–150)
LVSYSVOL: 119 mL
RATE: 0.85
Rest HR: 65 {beats}/min
SRS: 13
SSS: 19
TID: 1.13

## 2018-05-10 MED ORDER — TECHNETIUM TC 99M TETROFOSMIN IV KIT
10.0000 | PACK | Freq: Once | INTRAVENOUS | Status: AC | PRN
  Administered 2018-05-10: 9.63 via INTRAVENOUS

## 2018-05-10 MED ORDER — TECHNETIUM TC 99M TETROFOSMIN IV KIT
30.0000 | PACK | Freq: Once | INTRAVENOUS | Status: AC | PRN
  Administered 2018-05-10: 30.4 via INTRAVENOUS

## 2018-05-10 MED ORDER — REGADENOSON 0.4 MG/5ML IV SOLN
INTRAVENOUS | Status: AC
  Administered 2018-05-10: 0.4 mg via INTRAVENOUS
  Filled 2018-05-10: qty 5

## 2018-05-10 MED ORDER — SODIUM CHLORIDE 0.9% FLUSH
INTRAVENOUS | Status: AC
  Administered 2018-05-10: 10 mL via INTRAVENOUS
  Filled 2018-05-10: qty 10

## 2018-05-10 NOTE — Progress Notes (Signed)
Echocardiogram 2D Echocardiogram has been performed.  Jimmy Knox 05/10/2018, 8:37 AM

## 2018-05-11 ENCOUNTER — Telehealth: Payer: Self-pay

## 2018-05-11 MED ORDER — ISOSORBIDE MONONITRATE ER 30 MG PO TB24: 30.0000 mg | ORAL_TABLET | Freq: Every day | ORAL | 3 refills | Status: DC

## 2018-05-11 NOTE — Telephone Encounter (Signed)
-----   Message from Herminio Commons, MD sent at 05/10/2018  4:49 PM EDT ----- Evidence for blockage. Start Imdur 30 mg daily. I will discuss further at fu ov.

## 2018-05-11 NOTE — Telephone Encounter (Signed)
Pt's wife made aware, she voiced understanding. They will pick up medication today. Reminded of pt's appt.

## 2018-05-21 ENCOUNTER — Ambulatory Visit: Payer: Medicare Other | Admitting: Cardiovascular Disease

## 2018-05-21 ENCOUNTER — Telehealth: Payer: Self-pay | Admitting: Cardiovascular Disease

## 2018-05-21 NOTE — Telephone Encounter (Signed)
Spoke with patient and he c/o headaches that started since last Wednesday after stating imdur on last Tuesday. Patient said that tylenol doesn't relieve his headache. Patient advised that his is a common side effect of the medication. Patient advised to break the imdur 30 mg in half daily, start monitoring his BP at home, change positions slowly and contact our office if his symptoms don't improve. Patient advised for worsening symptoms that he needed to go to the ED for evaluation. Verbalized understanding of plan.

## 2018-05-21 NOTE — Telephone Encounter (Signed)
Pt came into office stating he's having problems since Wednesday with severe headaches, dizziness and has fallen.   Pt thinks it is coming from his isosorbide mononitrate (IMDUR) 30 MG 24 hr tablet [[id number]]  Would like to know what he should do, please call on his cell phone [phone]

## 2018-05-31 DIAGNOSIS — Z6829 Body mass index (BMI) 29.0-29.9, adult: Secondary | ICD-10-CM | POA: Diagnosis not present

## 2018-05-31 DIAGNOSIS — G500 Trigeminal neuralgia: Secondary | ICD-10-CM | POA: Diagnosis not present

## 2018-06-10 ENCOUNTER — Encounter

## 2018-06-10 ENCOUNTER — Encounter: Payer: Self-pay | Admitting: Cardiovascular Disease

## 2018-06-10 ENCOUNTER — Other Ambulatory Visit: Payer: Self-pay | Admitting: Cardiovascular Disease

## 2018-06-10 ENCOUNTER — Ambulatory Visit (INDEPENDENT_AMBULATORY_CARE_PROVIDER_SITE_OTHER): Payer: Medicare Other | Admitting: Cardiovascular Disease

## 2018-06-10 ENCOUNTER — Encounter: Payer: Self-pay | Admitting: *Deleted

## 2018-06-10 ENCOUNTER — Other Ambulatory Visit (HOSPITAL_COMMUNITY)
Admission: RE | Admit: 2018-06-10 | Discharge: 2018-06-10 | Disposition: A | Discharge status: Home / Self Care | Payer: Medicare Other | Source: Ambulatory Visit | Attending: Cardiovascular Disease | Admitting: Cardiovascular Disease

## 2018-06-10 VITALS — BP 110/70 | HR 68 | Ht 73.0 in | Wt 212.4 lb

## 2018-06-10 DIAGNOSIS — G4730 Sleep apnea, unspecified: Secondary | ICD-10-CM | POA: Diagnosis not present

## 2018-06-10 DIAGNOSIS — I252 Old myocardial infarction: Secondary | ICD-10-CM | POA: Diagnosis not present

## 2018-06-10 DIAGNOSIS — I779 Disorder of arteries and arterioles, unspecified: Secondary | ICD-10-CM

## 2018-06-10 DIAGNOSIS — R0609 Other forms of dyspnea: Secondary | ICD-10-CM

## 2018-06-10 DIAGNOSIS — R400 Somnolence: Secondary | ICD-10-CM

## 2018-06-10 DIAGNOSIS — E785 Hyperlipidemia, unspecified: Secondary | ICD-10-CM

## 2018-06-10 DIAGNOSIS — R9439 Abnormal result of other cardiovascular function study: Secondary | ICD-10-CM | POA: Diagnosis not present

## 2018-06-10 DIAGNOSIS — I739 Peripheral vascular disease, unspecified: Secondary | ICD-10-CM

## 2018-06-10 DIAGNOSIS — I209 Angina pectoris, unspecified: Secondary | ICD-10-CM

## 2018-06-10 DIAGNOSIS — I255 Ischemic cardiomyopathy: Secondary | ICD-10-CM | POA: Diagnosis not present

## 2018-06-10 DIAGNOSIS — I10 Essential (primary) hypertension: Secondary | ICD-10-CM | POA: Diagnosis not present

## 2018-06-10 DIAGNOSIS — I25708 Atherosclerosis of coronary artery bypass graft(s), unspecified, with other forms of angina pectoris: Secondary | ICD-10-CM

## 2018-06-10 DIAGNOSIS — R5383 Other fatigue: Secondary | ICD-10-CM

## 2018-06-10 LAB — BASIC METABOLIC PANEL
Anion gap: 8 (ref 5–15)
BUN: 20 mg/dL (ref 6–20)
CHLORIDE: 104 mmol/L (ref 101–111)
CO2: 27 mmol/L (ref 22–32)
Calcium: 9.1 mg/dL (ref 8.9–10.3)
Creatinine, Ser: 1.06 mg/dL (ref 0.61–1.24)
GFR calc Af Amer: >60 mL/min (ref >60)
GLUCOSE: 117 mg/dL — AB (ref 65–99)
POTASSIUM: 4.5 mmol/L (ref 3.5–5.1)
Sodium: 139 mmol/L (ref 135–145)

## 2018-06-10 LAB — CBC WITH DIFFERENTIAL/PLATELET
BASOS ABS: 0.1 10*3/uL (ref 0.0–0.1)
Basophils Relative: 1 %
EOS PCT: 2 %
Eosinophils Absolute: 0.3 10*3/uL (ref 0.0–0.7)
HEMATOCRIT: 41.6 % (ref 39.0–52.0)
HEMOGLOBIN: 13.8 g/dL (ref 13.0–17.0)
LYMPHS ABS: 4.4 10*3/uL — AB (ref 0.7–4.0)
LYMPHS PCT: 39 %
MCH: 30.9 pg (ref 26.0–34.0)
MCHC: 33.2 g/dL (ref 30.0–36.0)
MCV: 93.3 fL (ref 78.0–100.0)
Monocytes Absolute: 0.8 10*3/uL (ref 0.1–1.0)
Monocytes Relative: 7 %
NEUTROS ABS: 5.9 10*3/uL (ref 1.7–7.7)
Neutrophils Relative %: 51 %
PLATELETS: 207 10*3/uL (ref 150–400)
RBC: 4.46 MIL/uL (ref 4.22–5.81)
RDW: 13.4 % (ref 11.5–15.5)
WBC: 11.3 10*3/uL — ABNORMAL HIGH (ref 4.0–10.5)

## 2018-06-10 NOTE — Patient Instructions (Addendum)
Medication Instructions:  Your physician recommends that you continue on your current medications as directed. Please refer to the Current Medication list given to you today.   Labwork: Your physician recommends that you return for lab work in: Today    Testing/Procedures: Your physician has requested that you have a cardiac catheterization. Cardiac catheterization is used to diagnose and/or treat various heart conditions. Doctors may recommend this procedure for a number of different reasons. The most common reason is to evaluate chest pain. Chest pain can be a symptom of coronary artery disease (CAD), and cardiac catheterization can show whether plaque is narrowing or blocking your heart's arteries. This procedure is also used to evaluate the valves, as well as measure the blood flow and oxygen levels in different parts of your heart. For further information please visit [email or web address]. Please follow instruction sheet, as given.    Follow-Up: Your physician recommends that you schedule a follow-up appointment in: 6 Weeks    Any Other Special Instructions Will Be Listed Below (If Applicable).     If you need a refill on your cardiac medications before your next appointment, please call your pharmacy.    [address] [place] [address] Dept: [phone] Loc: [facility]  06/10/2018  You are scheduled for a Cardiac Catheterization on Tuesday, June 18 with Dr. Peter Martinique.  1. Please arrive at the [facility] (Main Entrance A) at [facility]: [place], [place] [address] at 7:00 AM (two hours before your procedure to ensure your preparation). Free valet parking service is available.   Special note: Every effort is made to have your procedure done on time. Please understand that emergencies sometimes delay scheduled procedures.  2. Diet: Do not  eat or drink anything after midnight prior to your procedure except sips of water to take medications.  3. Labs: None needed.  4. Medication instructions in preparation for your procedure:  On the morning of your procedure, take your Aspirin and any morning medicines NOT listed above.  You may use sips of water.  5. Plan for one night stay--bring personal belongings. 6. Bring a current list of your medications and current insurance cards. 7. You MUST have a responsible person to drive you home. 8. Someone MUST be with you the first 24 hours after you arrive home or your discharge will be delayed. 9. Please wear clothes that are easy to get on and off and wear slip-on shoes.  Thank you for allowing [place] to care for you!   -- [facility]

## 2018-06-10 NOTE — Progress Notes (Signed)
SUBJECTIVE: Patient presents for follow-up of aggressive exertional dyspnea in the context of coronary artery disease with prior myocardial infarction and ischemic cardiomyopathy.  Nuclear stress test on 05/10/2018 demonstrated moderate peri-infarct ischemia in the basal inferolateral and mid to basal segment.  There was mild peri-infarct ischemia in the mid to apical anteroseptal and particularly the mid segment.  It was deemed an intermediate risk study, LVEF 43%.  Echocardiogram demonstrated normal left ventricular systolic function with regional wall motion abnormalities, LVEF 50 to 55%, mild LVH, grade 1 diastolic dysfunction, calcified aortic annulus and calcified mitral annulus, with hypokinesis of the mid apical anteroseptal wall and apical inferior wall.  He continues to experience exertional dyspnea with activity such as doing yard work.  He denies exertional chest pain and tightness.  He continues to complain of dizziness.  He tried Imdur for 4 days but developed severe headaches and after stopping it symptoms improved.  In the meantime he developed trigeminal neuralgia and has been treated with gabapentin with symptom resolution after 2 or 3 days.  He is unable to perform activities which he did more readily 6 to 12 months ago.   SocHx: Married for [employer] in June 2019. Has 2 children and 3 granddaughters who live fairly close to them.    Review of Systems: As per "subjective", otherwise negative.  No Known Allergies  Current Outpatient Medications  Medication Sig Dispense Refill  . amitriptyline (ELAVIL) 50 MG tablet Take 50 mg by mouth at bedtime.    Marland Kitchen aspirin EC 81 MG tablet Take 81 mg by mouth daily.    Marland Kitchen gabapentin (NEURONTIN) 300 MG capsule Take 1 capsule by mouth 2 (two) times daily.  3  . isosorbide mononitrate (IMDUR) 30 MG 24 hr tablet Take 1 tablet (30 mg total) by mouth daily. 90 tablet 3  . losartan (COZAAR) 25 MG tablet TAKE 1 TABLET BY MOUTH ONCE  DAILY. 90 tablet 3  . metoprolol succinate (TOPROL-XL) 25 MG 24 hr tablet Take 25 mg by mouth daily.    . simvastatin (ZOCOR) 40 MG tablet Take 20 mg by mouth every evening.      No current facility-administered medications for this visit.     Past Medical History:  Diagnosis Date  . Arthritis    "right hip" (07/31/2016)  . Carotid artery disease ([place])    a. <50% bilaterally 05/2016.  Marland Kitchen Chronic combined systolic and diastolic CHF (congestive heart failure) ([place])   . Complication of anesthesia    "didn't wake up well after OR on 07/30/2016; took 2-3 to hold me down"  . Coronary artery disease    a. prior MI/stent around 2000. b. Nuc 09/2015 -> abnl, mgmd medically.  Marland Kitchen History of blood transfusion 07/30/2016   "after the operation"  . Hyperlipidemia   . Hypertension   . Ischemic cardiomyopathy   . Melanoma of nose ([place])    "inside on the right side"  . Myocardial infarction ([place]) ~ 2000  . Pneumonia 1935- 1970s ,"several times"  . Rocky Mountain spotted fever 1942  . Skin cancer    "on my back; arms; froze them off"  . Type 2 diabetes, diet controlled ([place])     Past Surgical History:  Procedure Laterality Date  . APPENDECTOMY    . BACK SURGERY    . CATARACT EXTRACTION, BILATERAL Bilateral   . COLONOSCOPY N/A 09/28/2015   Procedure: COLONOSCOPY;  Surgeon: Rogene Houston, MD;  [facility];  Service: Endoscopy;  Laterality: N/A;  955  . CORONARY ANGIOPLASTY WITH STENT PLACEMENT  2000  . CYSTOSCOPY W/ URETERAL STENT PLACEMENT    . EYE SURGERY Right 2013   "got hit by a limb & knocked my eye out"  . JOINT REPLACEMENT    . [place]  . MELANOMA EXCISION Right    "inside my nose"  . TONSILLECTOMY    . TOTAL HIP ARTHROPLASTY Right 07/29/2016  . TOTAL HIP ARTHROPLASTY Right 07/29/2016   Procedure: TOTAL HIP ARTHROPLASTY;  Surgeon: Garald Balding, MD;  Location: [facility];  Service: Orthopedics;  Laterality: Right;    Social History   Socioeconomic  History  . Marital status: Married    Spouse name: Not on file  . Number of children: Not on file  . Years of education: Not on file  . Highest education level: Not on file  Occupational History  . Occupation: Retired  Scientific laboratory technician  . Financial resource strain: Not on file  . Food insecurity:    Worry: Not on file    Inability: Not on file  . Transportation needs:    Medical: Not on file    Non-medical: Not on file  Tobacco Use  . Smoking status: Former Smoker    Packs/day: 1.00    Years: 20.00    Pack years: 20.00    Types: Cigarettes    Start date: 12/30/1943  . Smokeless tobacco: Never Used  . Tobacco comment: "stopped smoking in 1970 when I got pneumonia"  Substance and Sexual Activity  . Alcohol use: No    Alcohol/week: 0.0 oz  . Drug use: No  . Sexual activity: Never  Lifestyle  . Physical activity:    Days per week: Not on file    Minutes per session: Not on file  . Stress: Not on file  Relationships  . Social connections:    Talks on phone: Not on file    Gets together: Not on file    Attends religious service: Not on file    Active member of club or organization: Not on file    Attends meetings of clubs or organizations: Not on file    Relationship status: Not on file  . Intimate partner violence:    Fear of current or ex partner: Not on file    Emotionally abused: Not on file    Physically abused: Not on file    Forced sexual activity: Not on file  Other Topics Concern  . Not on file  Social History Narrative  . Not on file     Vitals:   06/10/18 1412  BP: 110/70  Pulse: 68  SpO2: 97%  Weight: 212 lb 6.4 oz (96.3 kg)  Height: 6\' 1"  (1.854 m)    Wt Readings from Last 3 Encounters:  06/10/18 212 lb 6.4 oz (96.3 kg)  04/30/18 210 lb (95.3 kg)  04/01/18 211 lb (95.7 kg)     PHYSICAL EXAM General: NAD HEENT: Normal. Neck: No JVD, no thyromegaly. Lungs: Faint bilateral diffuse crackles.   CV: Regular rate and rhythm, normal S1/S2, no  S3/S4, soft 1/6 apical holosystolic murmur. No pretibial or periankle edema.    Abdomen: Soft, nontender, no distention.  Neurologic: Alert and oriented.  Psych: Normal affect. Skin: Normal. Musculoskeletal: No gross deformities.    ECG: Most recent ECG reviewed.   Labs: Lab Results  Component Value Date/Time   K 3.7 08/01/2016 03:29 AM   BUN 16 08/01/2016 03:29 AM   CREATININE  1.04 08/01/2016 03:29 AM   ALT 23 07/21/2016 01:50 PM   HGB 10.5 (L) 08/01/2016 03:29 AM     Lipids: No results found for: LDLCALC, LDLDIRECT, CHOL, TRIG, HDL     ASSESSMENT AND PLAN:  1. CAD withprior MI andischemic cardiomyopathy with progressive exertional dyspnea and abnormal nuclear stress test: Lexiscan Cardiolite stress test results detailed above with areas of both moderate and mild peri-infarct ischemia.    Echocardiogram demonstrated regional wall motion abnormalities is also detailed above. He is currently taking aspirin 81 mg, losartan 25 mg, Toprol-XL 25 mg, and simvastatin 40 mg.  Heart rate is in 60 bpm range and thus I am unable to increase beta-blocker dose.   He did not tolerate Imdur 30 mg as it led to severe headaches. I will arrange for coronary angiography. Risks and benefits of cardiac catheterization have been discussed with the patient.  These include bleeding, infection, kidney damage, stroke, heart attack, death.  The patient understands these risks and is willing to proceed.  2. Essential HTN: Controlled.    No changes to therapy.  3. Hyperlipidemia: LDL 68 on 06/19/17.  Continue simvastatin 40 mg.  4. Carotid artery disease: Carotid Dopplers 06/20/16 less than 50% internal carotid artery stenosis bilaterally. Would repeat in 2+years.  5.Daytime somnolence with possible sleep-disordered breathing: I previously recommended a sleep study. He may have central sleep apnea. His wife recommends he get it done but he defers. I asked him to call me back if he changes his mind. I  did inform him of the risks associated with sleep apnea including MI, CVA, and arrhythmias.  6.  Progressive exertional dyspnea: Please refer to discussion in #1.  As stated above, there was chronic pulmonary scarring seen by chest x-ray in July 2017.  He did not obtain the chest x-ray which I previously ordered.  I will investigate further if he does not have significant coronary artery disease by coronary angiography.    Disposition: Follow up 6 weeks  A high level of decision making was required for increased medical complexities.    Kate Sable, M.D., F.A.C.C.

## 2018-06-10 NOTE — H&P (View-Only) (Signed)
SUBJECTIVE: Patient presents for follow-up of aggressive exertional dyspnea in the context of coronary artery disease with prior myocardial infarction and ischemic cardiomyopathy.  Nuclear stress test on 05/10/2018 demonstrated moderate peri-infarct ischemia in the basal inferolateral and mid to basal segment.  There was mild peri-infarct ischemia in the mid to apical anteroseptal and particularly the mid segment.  It was deemed an intermediate risk study, LVEF 43%.  Echocardiogram demonstrated normal left ventricular systolic function with regional wall motion abnormalities, LVEF 50 to 55%, mild LVH, grade 1 diastolic dysfunction, calcified aortic annulus and calcified mitral annulus, with hypokinesis of the mid apical anteroseptal wall and apical inferior wall.  He continues to experience exertional dyspnea with activity such as doing yard work.  He denies exertional chest pain and tightness.  He continues to complain of dizziness.  He tried Imdur for 4 days but developed severe headaches and after stopping it symptoms improved.  In the meantime he developed trigeminal neuralgia and has been treated with gabapentin with symptom resolution after 2 or 3 days.  He is unable to perform activities which he did more readily 6 to 12 months ago.   SocHx: Married for [employer] in June 2019. Has 2 children and 3 granddaughters who live fairly close to them.    Review of Systems: As per "subjective", otherwise negative.  No Known Allergies  Current Outpatient Medications  Medication Sig Dispense Refill  . amitriptyline (ELAVIL) 50 MG tablet Take 50 mg by mouth at bedtime.    Marland Kitchen aspirin EC 81 MG tablet Take 81 mg by mouth daily.    Marland Kitchen gabapentin (NEURONTIN) 300 MG capsule Take 1 capsule by mouth 2 (two) times daily.  3  . isosorbide mononitrate (IMDUR) 30 MG 24 hr tablet Take 1 tablet (30 mg total) by mouth daily. 90 tablet 3  . losartan (COZAAR) 25 MG tablet TAKE 1 TABLET BY MOUTH ONCE  DAILY. 90 tablet 3  . metoprolol succinate (TOPROL-XL) 25 MG 24 hr tablet Take 25 mg by mouth daily.    . simvastatin (ZOCOR) 40 MG tablet Take 20 mg by mouth every evening.      No current facility-administered medications for this visit.     Past Medical History:  Diagnosis Date  . Arthritis    "right hip" (07/31/2016)  . Carotid artery disease ([place])    a. <50% bilaterally 05/2016.  Marland Kitchen Chronic combined systolic and diastolic CHF (congestive heart failure) ([place])   . Complication of anesthesia    "didn't wake up well after OR on 07/30/2016; took 2-3 to hold me down"  . Coronary artery disease    a. prior MI/stent around 2000. b. Nuc 09/2015 -> abnl, mgmd medically.  Marland Kitchen History of blood transfusion 07/30/2016   "after the operation"  . Hyperlipidemia   . Hypertension   . Ischemic cardiomyopathy   . Melanoma of nose ([place])    "inside on the right side"  . Myocardial infarction ([place]) ~ 2000  . Pneumonia 1935- 1970s ,"several times"  . Rocky Mountain spotted fever 1942  . Skin cancer    "on my back; arms; froze them off"  . Type 2 diabetes, diet controlled ([place])     Past Surgical History:  Procedure Laterality Date  . APPENDECTOMY    . BACK SURGERY    . CATARACT EXTRACTION, BILATERAL Bilateral   . COLONOSCOPY N/A 09/28/2015   Procedure: COLONOSCOPY;  Surgeon: Rogene Houston, MD;  [facility];  Service: Endoscopy;  Laterality: N/A;  955  . CORONARY ANGIOPLASTY WITH STENT PLACEMENT  2000  . CYSTOSCOPY W/ URETERAL STENT PLACEMENT    . EYE SURGERY Right 2013   "got hit by a limb & knocked my eye out"  . JOINT REPLACEMENT    . [place]  . MELANOMA EXCISION Right    "inside my nose"  . TONSILLECTOMY    . TOTAL HIP ARTHROPLASTY Right 07/29/2016  . TOTAL HIP ARTHROPLASTY Right 07/29/2016   Procedure: TOTAL HIP ARTHROPLASTY;  Surgeon: Garald Balding, MD;  Location: [place];  Service: Orthopedics;  Laterality: Right;    Social History   Socioeconomic  History  . Marital status: Married    Spouse name: Not on file  . Number of children: Not on file  . Years of education: Not on file  . Highest education level: Not on file  Occupational History  . Occupation: Retired  Scientific laboratory technician  . Financial resource strain: Not on file  . Food insecurity:    Worry: Not on file    Inability: Not on file  . Transportation needs:    Medical: Not on file    Non-medical: Not on file  Tobacco Use  . Smoking status: Former Smoker    Packs/day: 1.00    Years: 20.00    Pack years: 20.00    Types: Cigarettes    Start date: 12/30/1943  . Smokeless tobacco: Never Used  . Tobacco comment: "stopped smoking in 1970 when I got pneumonia"  Substance and Sexual Activity  . Alcohol use: No    Alcohol/week: 0.0 oz  . Drug use: No  . Sexual activity: Never  Lifestyle  . Physical activity:    Days per week: Not on file    Minutes per session: Not on file  . Stress: Not on file  Relationships  . Social connections:    Talks on phone: Not on file    Gets together: Not on file    Attends religious service: Not on file    Active member of club or organization: Not on file    Attends meetings of clubs or organizations: Not on file    Relationship status: Not on file  . Intimate partner violence:    Fear of current or ex partner: Not on file    Emotionally abused: Not on file    Physically abused: Not on file    Forced sexual activity: Not on file  Other Topics Concern  . Not on file  Social History Narrative  . Not on file     Vitals:   06/10/18 1412  BP: 110/70  Pulse: 68  SpO2: 97%  Weight: 212 lb 6.4 oz (96.3 kg)  Height: 6\' 1"  (1.854 m)    Wt Readings from Last 3 Encounters:  06/10/18 212 lb 6.4 oz (96.3 kg)  04/30/18 210 lb (95.3 kg)  04/01/18 211 lb (95.7 kg)     PHYSICAL EXAM General: NAD HEENT: Normal. Neck: No JVD, no thyromegaly. Lungs: Faint bilateral diffuse crackles.   CV: Regular rate and rhythm, normal S1/S2, no  S3/S4, soft 1/6 apical holosystolic murmur. No pretibial or periankle edema.    Abdomen: Soft, nontender, no distention.  Neurologic: Alert and oriented.  Psych: Normal affect. Skin: Normal. Musculoskeletal: No gross deformities.    ECG: Most recent ECG reviewed.   Labs: Lab Results  Component Value Date/Time   K 3.7 08/01/2016 03:29 AM   BUN 16 08/01/2016 03:29 AM   CREATININE  1.04 08/01/2016 03:29 AM   ALT 23 07/21/2016 01:50 PM   HGB 10.5 (L) 08/01/2016 03:29 AM     Lipids: No results found for: LDLCALC, LDLDIRECT, CHOL, TRIG, HDL     ASSESSMENT AND PLAN:  1. CAD withprior MI andischemic cardiomyopathy with progressive exertional dyspnea and abnormal nuclear stress test: Lexiscan Cardiolite stress test results detailed above with areas of both moderate and mild peri-infarct ischemia.    Echocardiogram demonstrated regional wall motion abnormalities is also detailed above. He is currently taking aspirin 81 mg, losartan 25 mg, Toprol-XL 25 mg, and simvastatin 40 mg.  Heart rate is in 60 bpm range and thus I am unable to increase beta-blocker dose.   He did not tolerate Imdur 30 mg as it led to severe headaches. I will arrange for coronary angiography. Risks and benefits of cardiac catheterization have been discussed with the patient.  These include bleeding, infection, kidney damage, stroke, heart attack, death.  The patient understands these risks and is willing to proceed.  2. Essential HTN: Controlled.    No changes to therapy.  3. Hyperlipidemia: LDL 68 on 06/19/17.  Continue simvastatin 40 mg.  4. Carotid artery disease: Carotid Dopplers 06/20/16 less than 50% internal carotid artery stenosis bilaterally. Would repeat in 2+years.  5.Daytime somnolence with possible sleep-disordered breathing: I previously recommended a sleep study. He may have central sleep apnea. His wife recommends he get it done but he defers. I asked him to call me back if he changes his mind. I  did inform him of the risks associated with sleep apnea including MI, CVA, and arrhythmias.  6.  Progressive exertional dyspnea: Please refer to discussion in #1.  As stated above, there was chronic pulmonary scarring seen by chest x-ray in July 2017.  He did not obtain the chest x-ray which I previously ordered.  I will investigate further if he does not have significant coronary artery disease by coronary angiography.    Disposition: Follow up 6 weeks  A high level of decision making was required for increased medical complexities.    Kate Sable, M.D., F.A.C.C.

## 2018-06-14 ENCOUNTER — Telehealth: Payer: Self-pay | Admitting: *Deleted

## 2018-06-14 NOTE — Telephone Encounter (Addendum)
Catheterization scheduled at [facility] for: Tuesday June 15, 2018 9 AM Verify arrival time and place: [place] Entrance A at: 7AM  No solid food after midnight prior to cath, clear liquids until 5 AM day of procedure. Verify allergies in Epic Verify no diabetes medications.  AM meds can be  taken pre-cath with sip of water including: ASA 81 mg   Confirm patient has responsible person to drive home post procedure and observe patient for 24 hours:yes  06/10/18 WBC noted to be 11.3-pt denies fever symptoms/temperature.   Discussed instructions with patient, he verbalized understanding, thanked me for call.

## 2018-06-14 NOTE — Telephone Encounter (Signed)
Patient informed. Copy sent to PCP °

## 2018-06-14 NOTE — Telephone Encounter (Signed)
-----   Message from Herminio Commons, MD sent at 06/10/2018  5:23 PM EDT ----- WBC's mildly elevated. Send copy to PCP. Renal function normal. No anemia.

## 2018-06-15 ENCOUNTER — Other Ambulatory Visit: Payer: Self-pay | Admitting: Cardiology

## 2018-06-15 ENCOUNTER — Encounter (HOSPITAL_COMMUNITY): Payer: Self-pay | Admitting: Cardiology

## 2018-06-15 ENCOUNTER — Ambulatory Visit (HOSPITAL_COMMUNITY)
Admission: RE | Admit: 2018-06-15 | Discharge: 2018-06-15 | Disposition: A | Discharge status: Home / Self Care | Payer: Medicare Other | Source: Ambulatory Visit | Attending: Cardiology | Admitting: Cardiology

## 2018-06-15 ENCOUNTER — Encounter (HOSPITAL_COMMUNITY)
Admission: RE | Disposition: A | Discharge status: Home / Self Care | Payer: Self-pay | Source: Ambulatory Visit | Attending: Cardiology

## 2018-06-15 ENCOUNTER — Other Ambulatory Visit: Payer: Self-pay | Admitting: Physician Assistant

## 2018-06-15 DIAGNOSIS — I110 Hypertensive heart disease with heart failure: Secondary | ICD-10-CM | POA: Diagnosis not present

## 2018-06-15 DIAGNOSIS — I252 Old myocardial infarction: Secondary | ICD-10-CM | POA: Diagnosis not present

## 2018-06-15 DIAGNOSIS — I6529 Occlusion and stenosis of unspecified carotid artery: Secondary | ICD-10-CM | POA: Diagnosis present

## 2018-06-15 DIAGNOSIS — I5042 Chronic combined systolic (congestive) and diastolic (congestive) heart failure: Secondary | ICD-10-CM | POA: Diagnosis not present

## 2018-06-15 DIAGNOSIS — I255 Ischemic cardiomyopathy: Secondary | ICD-10-CM | POA: Diagnosis not present

## 2018-06-15 DIAGNOSIS — I2582 Chronic total occlusion of coronary artery: Secondary | ICD-10-CM | POA: Diagnosis not present

## 2018-06-15 DIAGNOSIS — R0609 Other forms of dyspnea: Secondary | ICD-10-CM | POA: Diagnosis present

## 2018-06-15 DIAGNOSIS — Z87891 Personal history of nicotine dependence: Secondary | ICD-10-CM | POA: Diagnosis not present

## 2018-06-15 DIAGNOSIS — Y831 Surgical operation with implant of artificial internal device as the cause of abnormal reaction of the patient, or of later complication, without mention of misadventure at the time of the procedure: Secondary | ICD-10-CM | POA: Diagnosis not present

## 2018-06-15 DIAGNOSIS — I209 Angina pectoris, unspecified: Secondary | ICD-10-CM

## 2018-06-15 DIAGNOSIS — I25119 Atherosclerotic heart disease of native coronary artery with unspecified angina pectoris: Secondary | ICD-10-CM | POA: Diagnosis not present

## 2018-06-15 DIAGNOSIS — I6523 Occlusion and stenosis of bilateral carotid arteries: Secondary | ICD-10-CM | POA: Diagnosis not present

## 2018-06-15 DIAGNOSIS — E119 Type 2 diabetes mellitus without complications: Secondary | ICD-10-CM | POA: Diagnosis not present

## 2018-06-15 DIAGNOSIS — M1611 Unilateral primary osteoarthritis, right hip: Secondary | ICD-10-CM | POA: Insufficient documentation

## 2018-06-15 DIAGNOSIS — I2584 Coronary atherosclerosis due to calcified coronary lesion: Secondary | ICD-10-CM | POA: Insufficient documentation

## 2018-06-15 DIAGNOSIS — T82855A Stenosis of coronary artery stent, initial encounter: Secondary | ICD-10-CM | POA: Insufficient documentation

## 2018-06-15 DIAGNOSIS — Z01812 Encounter for preprocedural laboratory examination: Secondary | ICD-10-CM

## 2018-06-15 DIAGNOSIS — E785 Hyperlipidemia, unspecified: Secondary | ICD-10-CM | POA: Diagnosis present

## 2018-06-15 DIAGNOSIS — I10 Essential (primary) hypertension: Secondary | ICD-10-CM | POA: Diagnosis present

## 2018-06-15 DIAGNOSIS — Z7982 Long term (current) use of aspirin: Secondary | ICD-10-CM | POA: Diagnosis not present

## 2018-06-15 DIAGNOSIS — G500 Trigeminal neuralgia: Secondary | ICD-10-CM | POA: Insufficient documentation

## 2018-06-15 HISTORY — PX: LEFT HEART CATH AND CORONARY ANGIOGRAPHY: CATH118249

## 2018-06-15 SURGERY — LEFT HEART CATH AND CORONARY ANGIOGRAPHY
Anesthesia: LOCAL

## 2018-06-15 MED ORDER — ONDANSETRON HCL 4 MG/2ML IJ SOLN
4.0000 mg | Freq: Four times a day (QID) | INTRAMUSCULAR | Status: DC | PRN
Start: 2018-06-15 — End: 2018-06-15

## 2018-06-15 MED ORDER — SODIUM CHLORIDE 0.9 % IV SOLN: 250.0000 mL | INTRAVENOUS | Status: DC | PRN

## 2018-06-15 MED ORDER — SODIUM CHLORIDE 0.9 % WEIGHT BASED INFUSION
3.0000 mL/kg/h | INTRAVENOUS | Status: AC
  Administered 2018-06-15: 3 mL/kg/h via INTRAVENOUS

## 2018-06-15 MED ORDER — ACETAMINOPHEN 325 MG PO TABS: 650.0000 mg | ORAL_TABLET | ORAL | Status: DC | PRN

## 2018-06-15 MED ORDER — HEPARIN SODIUM (PORCINE) 1000 UNIT/ML IJ SOLN
INTRAMUSCULAR | Status: AC
  Filled 2018-06-15: qty 1

## 2018-06-15 MED ORDER — CLOPIDOGREL BISULFATE 75 MG PO TABS: 75.0000 mg | ORAL_TABLET | Freq: Every day | ORAL | 3 refills | Status: DC

## 2018-06-15 MED ORDER — SODIUM CHLORIDE 0.9 % WEIGHT BASED INFUSION: 1.0000 mL/kg/h | INTRAVENOUS | Status: DC

## 2018-06-15 MED ORDER — SODIUM CHLORIDE 0.9% FLUSH: 3.0000 mL | INTRAVENOUS | Status: DC | PRN

## 2018-06-15 MED ORDER — HEPARIN (PORCINE) IN NACL 1000-0.9 UT/500ML-% IV SOLN
INTRAVENOUS | Status: AC
  Filled 2018-06-15: qty 1000

## 2018-06-15 MED ORDER — ASPIRIN 81 MG PO CHEW: 81.0000 mg | CHEWABLE_TABLET | ORAL | Status: DC

## 2018-06-15 MED ORDER — IOHEXOL 350 MG/ML SOLN
INTRAVENOUS | Status: DC | PRN
  Administered 2018-06-15: 65 mL via INTRAVENOUS

## 2018-06-15 MED ORDER — LIDOCAINE HCL (PF) 1 % IJ SOLN
INTRAMUSCULAR | Status: DC | PRN
  Administered 2018-06-15: 2 mL

## 2018-06-15 MED ORDER — SODIUM CHLORIDE 0.9% FLUSH: 3.0000 mL | Freq: Two times a day (BID) | INTRAVENOUS | Status: DC

## 2018-06-15 MED ORDER — LIDOCAINE HCL (PF) 1 % IJ SOLN
INTRAMUSCULAR | Status: AC
  Filled 2018-06-15: qty 30

## 2018-06-15 MED ORDER — HEPARIN SODIUM (PORCINE) 1000 UNIT/ML IJ SOLN
INTRAMUSCULAR | Status: DC | PRN
  Administered 2018-06-15: 5000 [IU] via INTRAVENOUS

## 2018-06-15 MED ORDER — VERAPAMIL HCL 2.5 MG/ML IV SOLN
INTRAVENOUS | Status: AC
  Filled 2018-06-15: qty 2

## 2018-06-15 MED ORDER — HEPARIN (PORCINE) IN NACL 2-0.9 UNITS/ML
INTRAMUSCULAR | Status: DC | PRN
  Administered 2018-06-15: 10 mL via INTRA_ARTERIAL

## 2018-06-15 MED ORDER — HEPARIN (PORCINE) IN NACL 2-0.9 UNITS/ML
INTRAMUSCULAR | Status: AC | PRN
  Administered 2018-06-15: 1000 mL

## 2018-06-15 SURGICAL SUPPLY — 9 items
CATH 5FR JL3.5 JR4 ANG PIG MP (CATHETERS) ×1 IMPLANT
GLIDESHEATH SLEND SS 6F .021 (SHEATH) ×1 IMPLANT
GUIDEWIRE INQWIRE 1.5J.035X260 (WIRE) IMPLANT
INQWIRE 1.5J .035X260CM (WIRE) ×2
KIT HEART LEFT (KITS) ×2 IMPLANT
PACK CARDIAC CATHETERIZATION (CUSTOM PROCEDURE TRAY) ×2 IMPLANT
TRANSDUCER W/STOPCOCK (MISCELLANEOUS) ×2 IMPLANT
TUBING CIL FLEX 10 FLL-RA (TUBING) ×2 IMPLANT
WIRE HI TORQ VERSACORE-J 145CM (WIRE) ×1 IMPLANT

## 2018-06-15 NOTE — Progress Notes (Signed)
Outpatient CTO schedule on 07/15/18 at 10:30am. Patient to arrive at 8:30am. BMET and CBC in 2 weeks.

## 2018-06-15 NOTE — Interval H&P Note (Signed)
History and Physical Interval Note:  06/15/2018 8:47 AM  Jimmy Knox  has presented today for surgery, with the diagnosis of chest pain, abnormal nuc  The various methods of treatment have been discussed with the patient and family. After consideration of risks, benefits and other options for treatment, the patient has consented to  Procedure(s): LEFT HEART CATH AND CORONARY ANGIOGRAPHY (N/A) as a surgical intervention .  The patient's history has been reviewed, patient examined, no change in status, stable for surgery.  I have reviewed the patient's chart and labs.  Questions were answered to the patient's satisfaction.   Cath Lab Visit (complete for each Cath Lab visit)  Clinical Evaluation Leading to the Procedure:   ACS: No.  Non-ACS:    Anginal Classification: CCS III  Anti-ischemic medical therapy: Minimal Therapy (1 class of medications)  Non-Invasive Test Results: Intermediate-risk stress test findings: cardiac mortality 1-3%/year  Prior CABG: No previous CABG       Jimmy Knox [facility] 06/15/2018 8:47 AM

## 2018-06-15 NOTE — Discharge Instructions (Signed)
° °  Radial Site Care Refer to this sheet in the next few weeks. These instructions provide you with information about caring for yourself after your procedure. Your health care provider may also give you more specific instructions. Your treatment has been planned according to current medical practices, but problems sometimes occur. Call your health care provider if you have any problems or questions after your procedure. What can I expect after the procedure? After your procedure, it is typical to have the following:  Bruising at the radial site that usually fades within 1-2 weeks.  Blood collecting in the tissue (hematoma) that may be painful to the touch. It should usually decrease in size and tenderness within 1-2 weeks.  Follow these instructions at home:  Take medicines only as directed by your health care provider.  You may shower 24-48 hours after the procedure or as directed by your health care provider. Remove the bandage (dressing) and gently wash the site with plain soap and water. Pat the area dry with a clean towel. Do not rub the site, because this may cause bleeding.  Do not take baths, swim, or use a hot tub until your health care provider approves.  Check your insertion site every day for redness, swelling, or drainage.  Do not apply powder or lotion to the site.  Do not flex or bend the affected arm for 24 hours or as directed by your health care provider.  Do not push or pull heavy objects with the affected arm for 24 hours or as directed by your health care provider.  Do not lift over 10 lb (4.5 kg) for 5 days after your procedure or as directed by your health care provider.  Ask your health care provider when it is okay to: ? Return to work or school. ? Resume usual physical activities or sports. ? Resume sexual activity.  Do not drive home if you are discharged the same day as the procedure. Have someone else drive you.  You may drive 24 hours after the  procedure unless otherwise instructed by your health care provider.  Do not operate machinery or power tools for 24 hours after the procedure.  If your procedure was done as an outpatient procedure, which means that you went home the same day as your procedure, a responsible adult should be with you for the first 24 hours after you arrive home.  Keep all follow-up visits as directed by your health care provider. This is important. Contact a health care provider if:  You have a fever.  You have chills.  You have increased bleeding from the radial site. Hold pressure on the site. Get help right away if:  You have unusual pain at the radial site.  You have redness, warmth, or swelling at the radial site.  You have drainage (other than a small amount of blood on the dressing) from the radial site.  The radial site is bleeding, and the bleeding does not stop after 30 minutes of holding steady pressure on the site.  Your arm or hand becomes pale, cool, tingly, or numb. This information is not intended to replace advice given to you by your health care provider. Make sure you discuss any questions you have with your health care provider. Document Released: 01/17/2011 Document Revised: 05/22/2016 Document Reviewed: 07/03/2014 Elsevier Interactive Patient Education  2018 [employer].   Start Plavix 75 mg daily in addition to ASA.

## 2018-06-16 MED FILL — Heparin Sod (Porcine)-NaCl IV Soln 1000 Unit/500ML-0.9%: INTRAVENOUS | Qty: 1000 | Status: AC

## 2018-06-24 DIAGNOSIS — H1811 Bullous keratopathy, right eye: Secondary | ICD-10-CM | POA: Diagnosis not present

## 2018-06-24 DIAGNOSIS — H47231 Glaucomatous optic atrophy, right eye: Secondary | ICD-10-CM | POA: Diagnosis not present

## 2018-07-12 ENCOUNTER — Telehealth: Payer: Self-pay | Admitting: *Deleted

## 2018-07-12 NOTE — Telephone Encounter (Addendum)
Pt contacted pre-catheterization scheduled at [facility] for: Thursday July 15, 2018 10:30 AM Verified arrival time and place: [place] Entrance A at: 8 AM  No solid food after midnight prior to cath, clear liquids until 5 AM day of procedure. Verified no known allergies. Verified no diabetes medications.  AM meds can be  taken pre-cath with sip of water including: ASA 81 mg Clopidogrel 75 mg  Confirmed patient has responsible person to drive home post procedure and for 24 hours after you arrive home: yes   I discussed instructions with patient's wife, [name], she verbalized understanding, thanked me for call.  I spoke with Lattie Haw in [place],  arranged for pt to have pre-procedure BMP/CBC tomorrow in [place] tomorrow, orders will be printed  and left at desk in [facility] for pt to pick up, pt will go to registration, then go to lab at [facility].  07/13/18 GFR 57-per guidelines-instructed pt to hold losartan day before and day of procedure.

## 2018-07-13 ENCOUNTER — Other Ambulatory Visit (HOSPITAL_COMMUNITY)
Admission: RE | Admit: 2018-07-13 | Discharge: 2018-07-13 | Disposition: A | Discharge status: Home / Self Care | Payer: Medicare Other | Source: Ambulatory Visit | Attending: Physician Assistant | Admitting: Physician Assistant

## 2018-07-13 DIAGNOSIS — Z01812 Encounter for preprocedural laboratory examination: Secondary | ICD-10-CM | POA: Diagnosis not present

## 2018-07-13 LAB — CBC
HEMATOCRIT: 41.7 % (ref 39.0–52.0)
Hemoglobin: 14.4 g/dL (ref 13.0–17.0)
MCH: 31.8 pg (ref 26.0–34.0)
MCHC: 34.5 g/dL (ref 30.0–36.0)
MCV: 92.1 fL (ref 78.0–100.0)
Platelets: 178 10*3/uL (ref 150–400)
RBC: 4.53 MIL/uL (ref 4.22–5.81)
RDW: 13.2 % (ref 11.5–15.5)
WBC: 10.2 10*3/uL (ref 4.0–10.5)

## 2018-07-13 LAB — BASIC METABOLIC PANEL
ANION GAP: 6 (ref 5–15)
BUN: 15 mg/dL (ref 8–23)
CO2: 27 mmol/L (ref 22–32)
Calcium: 9 mg/dL (ref 8.9–10.3)
Chloride: 104 mmol/L (ref 98–111)
Creatinine, Ser: 1.15 mg/dL (ref 0.61–1.24)
GFR calc non Af Amer: 57 mL/min — ABNORMAL LOW (ref >60)
Glucose, Bld: 129 mg/dL — ABNORMAL HIGH (ref 70–99)
Potassium: 4.2 mmol/L (ref 3.5–5.1)
Sodium: 137 mmol/L (ref 135–145)

## 2018-07-14 ENCOUNTER — Telehealth: Payer: Self-pay

## 2018-07-14 NOTE — Telephone Encounter (Signed)
-----   Message from [place], [place] sent at 07/14/2018  8:48 AM EDT ----- Normal renal function for CT.

## 2018-07-14 NOTE — Telephone Encounter (Signed)
Notes recorded by Frederik Schmidt, RN on 07/14/2018 at 11:43 AM EDT Informed patient of lab results. He verbalized understanding. ------

## 2018-07-15 ENCOUNTER — Ambulatory Visit (HOSPITAL_COMMUNITY)
Admission: RE | Disposition: A | Discharge status: Home / Self Care | Payer: Self-pay | Source: Ambulatory Visit | Attending: Cardiology

## 2018-07-15 ENCOUNTER — Ambulatory Visit (HOSPITAL_COMMUNITY)
Admission: RE | Admit: 2018-07-15 | Discharge: 2018-07-16 | Disposition: A | Discharge status: Home / Self Care | Payer: Medicare Other | Source: Ambulatory Visit | Attending: Cardiology | Admitting: Cardiology

## 2018-07-15 ENCOUNTER — Other Ambulatory Visit: Payer: Self-pay

## 2018-07-15 ENCOUNTER — Encounter (HOSPITAL_COMMUNITY): Payer: Self-pay | Admitting: General Practice

## 2018-07-15 DIAGNOSIS — I10 Essential (primary) hypertension: Secondary | ICD-10-CM | POA: Diagnosis present

## 2018-07-15 DIAGNOSIS — I2584 Coronary atherosclerosis due to calcified coronary lesion: Secondary | ICD-10-CM | POA: Diagnosis not present

## 2018-07-15 DIAGNOSIS — I252 Old myocardial infarction: Secondary | ICD-10-CM | POA: Insufficient documentation

## 2018-07-15 DIAGNOSIS — E119 Type 2 diabetes mellitus without complications: Secondary | ICD-10-CM | POA: Insufficient documentation

## 2018-07-15 DIAGNOSIS — I110 Hypertensive heart disease with heart failure: Secondary | ICD-10-CM | POA: Diagnosis not present

## 2018-07-15 DIAGNOSIS — I5042 Chronic combined systolic (congestive) and diastolic (congestive) heart failure: Secondary | ICD-10-CM | POA: Insufficient documentation

## 2018-07-15 DIAGNOSIS — Z7902 Long term (current) use of antithrombotics/antiplatelets: Secondary | ICD-10-CM | POA: Insufficient documentation

## 2018-07-15 DIAGNOSIS — I209 Angina pectoris, unspecified: Secondary | ICD-10-CM | POA: Diagnosis present

## 2018-07-15 DIAGNOSIS — I97630 Postprocedural hematoma of a circulatory system organ or structure following a cardiac catheterization: Secondary | ICD-10-CM | POA: Diagnosis not present

## 2018-07-15 DIAGNOSIS — I2582 Chronic total occlusion of coronary artery: Secondary | ICD-10-CM | POA: Insufficient documentation

## 2018-07-15 DIAGNOSIS — Z87891 Personal history of nicotine dependence: Secondary | ICD-10-CM | POA: Insufficient documentation

## 2018-07-15 DIAGNOSIS — Z955 Presence of coronary angioplasty implant and graft: Secondary | ICD-10-CM

## 2018-07-15 DIAGNOSIS — E785 Hyperlipidemia, unspecified: Secondary | ICD-10-CM | POA: Diagnosis not present

## 2018-07-15 DIAGNOSIS — I25118 Atherosclerotic heart disease of native coronary artery with other forms of angina pectoris: Secondary | ICD-10-CM | POA: Insufficient documentation

## 2018-07-15 DIAGNOSIS — I255 Ischemic cardiomyopathy: Secondary | ICD-10-CM | POA: Diagnosis present

## 2018-07-15 DIAGNOSIS — Y840 Cardiac catheterization as the cause of abnormal reaction of the patient, or of later complication, without mention of misadventure at the time of the procedure: Secondary | ICD-10-CM | POA: Insufficient documentation

## 2018-07-15 DIAGNOSIS — I25119 Atherosclerotic heart disease of native coronary artery with unspecified angina pectoris: Secondary | ICD-10-CM | POA: Diagnosis not present

## 2018-07-15 DIAGNOSIS — I2510 Atherosclerotic heart disease of native coronary artery without angina pectoris: Secondary | ICD-10-CM | POA: Diagnosis present

## 2018-07-15 DIAGNOSIS — Z7982 Long term (current) use of aspirin: Secondary | ICD-10-CM | POA: Diagnosis not present

## 2018-07-15 HISTORY — DX: Other disorders of facial nerve: G51.8

## 2018-07-15 HISTORY — DX: Anxiety disorder, unspecified: F41.9

## 2018-07-15 HISTORY — PX: CORONARY CTO INTERVENTION: CATH118236

## 2018-07-15 LAB — GLUCOSE, CAPILLARY
GLUCOSE-CAPILLARY: 109 mg/dL — AB (ref 70–99)
GLUCOSE-CAPILLARY: 97 mg/dL (ref 70–99)

## 2018-07-15 LAB — POCT ACTIVATED CLOTTING TIME
ACTIVATED CLOTTING TIME: 213 s
ACTIVATED CLOTTING TIME: 323 s
ACTIVATED CLOTTING TIME: 268 s
ACTIVATED CLOTTING TIME: 312 s
ACTIVATED CLOTTING TIME: 186 s
Activated Clotting Time: 246 seconds
Activated Clotting Time: 208 seconds

## 2018-07-15 SURGERY — CORONARY CTO INTERVENTION
Anesthesia: LOCAL

## 2018-07-15 MED ORDER — SODIUM CHLORIDE 0.9 % WEIGHT BASED INFUSION: 1.0000 mL/kg/h | INTRAVENOUS | Status: DC

## 2018-07-15 MED ORDER — LOSARTAN POTASSIUM 50 MG PO TABS
25.0000 mg | ORAL_TABLET | Freq: Every day | ORAL | Status: DC
  Administered 2018-07-15: 21:00:00 25 mg via ORAL
  Filled 2018-07-15 (×2): qty 1

## 2018-07-15 MED ORDER — IOPAMIDOL (ISOVUE-370) INJECTION 76%
INTRAVENOUS | Status: AC
  Filled 2018-07-15: qty 50

## 2018-07-15 MED ORDER — HEPARIN SODIUM (PORCINE) 1000 UNIT/ML IJ SOLN
INTRAMUSCULAR | Status: AC
  Filled 2018-07-15: qty 1

## 2018-07-15 MED ORDER — FENTANYL CITRATE (PF) 100 MCG/2ML IJ SOLN
INTRAMUSCULAR | Status: AC
  Filled 2018-07-15: qty 2

## 2018-07-15 MED ORDER — IOPAMIDOL (ISOVUE-370) INJECTION 76%
INTRAVENOUS | Status: AC
  Filled 2018-07-15: qty 125

## 2018-07-15 MED ORDER — MIDAZOLAM HCL 2 MG/2ML IJ SOLN
INTRAMUSCULAR | Status: AC
  Filled 2018-07-15: qty 2

## 2018-07-15 MED ORDER — LIDOCAINE HCL (PF) 1 % IJ SOLN
INTRAMUSCULAR | Status: AC
  Filled 2018-07-15: qty 30

## 2018-07-15 MED ORDER — SIMVASTATIN 20 MG PO TABS
20.0000 mg | ORAL_TABLET | Freq: Every evening | ORAL | Status: DC
  Administered 2018-07-15: 20 mg via ORAL
  Filled 2018-07-15: qty 1

## 2018-07-15 MED ORDER — ACETAMINOPHEN 325 MG PO TABS: 650.0000 mg | ORAL_TABLET | ORAL | Status: DC | PRN

## 2018-07-15 MED ORDER — MIDAZOLAM HCL 2 MG/2ML IJ SOLN
INTRAMUSCULAR | Status: DC | PRN
  Administered 2018-07-15 (×3): 1 mg via INTRAVENOUS

## 2018-07-15 MED ORDER — SODIUM CHLORIDE 0.9% FLUSH: 3.0000 mL | INTRAVENOUS | Status: DC | PRN

## 2018-07-15 MED ORDER — HEPARIN (PORCINE) IN NACL 1000-0.9 UT/500ML-% IV SOLN
INTRAVENOUS | Status: AC
  Filled 2018-07-15: qty 500

## 2018-07-15 MED ORDER — FENTANYL CITRATE (PF) 100 MCG/2ML IJ SOLN
INTRAMUSCULAR | Status: DC | PRN
  Administered 2018-07-15 (×3): 25 ug via INTRAVENOUS

## 2018-07-15 MED ORDER — HEPARIN SODIUM (PORCINE) 1000 UNIT/ML IJ SOLN
INTRAMUSCULAR | Status: DC | PRN
  Administered 2018-07-15: 4000 [IU] via INTRAVENOUS
  Administered 2018-07-15: 5000 [IU] via INTRAVENOUS
  Administered 2018-07-15: 9000 [IU] via INTRAVENOUS

## 2018-07-15 MED ORDER — IOPAMIDOL (ISOVUE-370) INJECTION 76%
INTRAVENOUS | Status: AC
  Filled 2018-07-15: qty 100

## 2018-07-15 MED ORDER — HEPARIN (PORCINE) IN NACL 1000-0.9 UT/500ML-% IV SOLN
INTRAVENOUS | Status: AC
  Filled 2018-07-15: qty 1000

## 2018-07-15 MED ORDER — SODIUM CHLORIDE 0.9% FLUSH
3.0000 mL | Freq: Two times a day (BID) | INTRAVENOUS | Status: DC
  Administered 2018-07-16: 3 mL via INTRAVENOUS

## 2018-07-15 MED ORDER — NITROGLYCERIN 1 MG/10 ML FOR IR/CATH LAB
INTRA_ARTERIAL | Status: AC
  Filled 2018-07-15: qty 10

## 2018-07-15 MED ORDER — ASPIRIN EC 81 MG PO TBEC
81.0000 mg | DELAYED_RELEASE_TABLET | Freq: Every day | ORAL | Status: DC
  Administered 2018-07-16: 81 mg via ORAL
  Filled 2018-07-15: qty 1

## 2018-07-15 MED ORDER — IOPAMIDOL (ISOVUE-370) INJECTION 76%
INTRAVENOUS | Status: DC | PRN
  Administered 2018-07-15: 170 mL via INTRA_ARTERIAL

## 2018-07-15 MED ORDER — SODIUM CHLORIDE 0.9 % WEIGHT BASED INFUSION: 1.0000 mL/kg/h | INTRAVENOUS | Status: AC

## 2018-07-15 MED ORDER — ATROPINE SULFATE 1 MG/10ML IJ SOSY
PREFILLED_SYRINGE | INTRAMUSCULAR | Status: AC
  Filled 2018-07-15: qty 10

## 2018-07-15 MED ORDER — LIDOCAINE HCL (PF) 1 % IJ SOLN
INTRAMUSCULAR | Status: DC | PRN
  Administered 2018-07-15: 14 mL
  Administered 2018-07-15: 11 mL

## 2018-07-15 MED ORDER — ASPIRIN 81 MG PO CHEW: 81.0000 mg | CHEWABLE_TABLET | ORAL | Status: DC

## 2018-07-15 MED ORDER — SODIUM CHLORIDE 0.9 % IV SOLN: 250.0000 mL | INTRAVENOUS | Status: DC | PRN

## 2018-07-15 MED ORDER — ANGIOPLASTY BOOK
Freq: Once | Status: AC
  Administered 2018-07-15: 22:00:00
  Filled 2018-07-15: qty 1

## 2018-07-15 MED ORDER — SODIUM CHLORIDE 0.9% FLUSH: 3.0000 mL | Freq: Two times a day (BID) | INTRAVENOUS | Status: DC

## 2018-07-15 MED ORDER — CLOPIDOGREL BISULFATE 75 MG PO TABS
75.0000 mg | ORAL_TABLET | Freq: Every day | ORAL | Status: DC
Start: 2018-07-16 — End: 2018-07-16
  Administered 2018-07-16: 09:00:00 75 mg via ORAL
  Filled 2018-07-15: qty 1

## 2018-07-15 MED ORDER — CLOPIDOGREL BISULFATE 75 MG PO TABS: 75.0000 mg | ORAL_TABLET | ORAL | Status: DC

## 2018-07-15 MED ORDER — SODIUM CHLORIDE 0.9 % WEIGHT BASED INFUSION
3.0000 mL/kg/h | INTRAVENOUS | Status: DC
  Administered 2018-07-15: 3 mL/kg/h via INTRAVENOUS

## 2018-07-15 MED ORDER — HEPARIN (PORCINE) IN NACL 1000-0.9 UT/500ML-% IV SOLN
INTRAVENOUS | Status: DC | PRN
  Administered 2018-07-15 (×4): 500 mL

## 2018-07-15 MED ORDER — METOPROLOL SUCCINATE ER 25 MG PO TB24
25.0000 mg | ORAL_TABLET | Freq: Every day | ORAL | Status: DC
  Administered 2018-07-15 – 2018-07-16 (×2): 25 mg via ORAL
  Filled 2018-07-15 (×2): qty 1

## 2018-07-15 MED ORDER — AMITRIPTYLINE HCL 25 MG PO TABS
50.0000 mg | ORAL_TABLET | Freq: Every day | ORAL | Status: DC
  Administered 2018-07-15: 22:00:00 50 mg via ORAL
  Filled 2018-07-15: qty 2

## 2018-07-15 MED ORDER — ONDANSETRON HCL 4 MG/2ML IJ SOLN
4.0000 mg | Freq: Four times a day (QID) | INTRAMUSCULAR | Status: DC | PRN
Start: 2018-07-15 — End: 2018-07-16

## 2018-07-15 SURGICAL SUPPLY — 34 items
BALLN SAPPHIRE 2.0X20 (BALLOONS) ×2
BALLN SAPPHIRE 2.5X15 (BALLOONS) ×2
BALLN ~~LOC~~ EUPHORA RX 3.75X27 (BALLOONS) ×2
BALLOON SAPPHIRE 2.0X20 (BALLOONS) IMPLANT
BALLOON SAPPHIRE 2.5X15 (BALLOONS) IMPLANT
BALLOON ~~LOC~~ EUPHORA RX 3.75X27 (BALLOONS) IMPLANT
CATH INFINITI 5FR JL4 (CATHETERS) ×1 IMPLANT
CATH INFINITI JR4 5F (CATHETERS) ×1 IMPLANT
CATH MACH1 8FR AL.75  90CM (CATHETERS) ×1
CATH MACH1 8FR AL.75 90CM (CATHETERS) IMPLANT
CATH MACH1 8FR CLS3.5 (CATHETERS) ×1 IMPLANT
CATH TRAPPER 6-8F (CATHETERS) ×1 IMPLANT
CATH TURNPIKE 135CM (CATHETERS) ×1 IMPLANT
COVER PRB 48X5XTLSCP FOLD TPE (BAG) IMPLANT
COVER PROBE 5X48 (BAG) ×2
GUIDEWIRE INQWIRE 1.5J.035X260 (WIRE) IMPLANT
INQWIRE 1.5J .035X260CM (WIRE)
KIT ENCORE 26 ADVANTAGE (KITS) ×3 IMPLANT
KIT HEART LEFT (KITS) ×3 IMPLANT
KIT HEMO VALVE WATCHDOG (MISCELLANEOUS) ×1 IMPLANT
PACK CARDIAC CATHETERIZATION (CUSTOM PROCEDURE TRAY) ×2 IMPLANT
PATCH THROMBIX TOPICAL PLAIN (HEMOSTASIS) ×1 IMPLANT
SHEATH BRITE TIP 8FR 35CM (SHEATH) ×1 IMPLANT
SHEATH PINNACLE 5F 10CM (SHEATH) ×1 IMPLANT
SHIELD RADPAD SCOOP 12X17 (MISCELLANEOUS) ×1 IMPLANT
STENT SYNERGY DES 3.5X38 (Permanent Stent) ×1 IMPLANT
TRANSDUCER W/STOPCOCK (MISCELLANEOUS) ×3 IMPLANT
TUBING CIL FLEX 10 FLL-RA (TUBING) ×2 IMPLANT
WIRE ASAHI MIRACLEBROS-3 180CM (WIRE) ×1 IMPLANT
WIRE ASAHI MIRACLEBROS-6 180CM (WIRE) ×1 IMPLANT
WIRE ASAHI PROWATER 180CM (WIRE) ×1 IMPLANT
WIRE EMERALD 3MM-J .035X150CM (WIRE) ×1 IMPLANT
WIRE FIGHTER CROSSING 190CM (WIRE) ×2 IMPLANT
WIRE HORNET 14 STR TIP 19CM (WIRE) ×1 IMPLANT

## 2018-07-15 NOTE — H&P (Signed)
Cardiology Admission History and Physical:   Patient ID: Jimmy Knox; MRN: [id number]; DOB: [birth year]   Admission date: 07/15/2018  Primary Care Provider: Asencion Noble, MD Primary Cardiologist: Kate Sable, MD   Chief Complaint:  Dyspnea on exertion.   Patient Profile:   Jimmy Knox is a 84 y.o. male with a history of exertional dyspnea and CAD presents for CTO PCI.  History of Present Illness:   Jimmy Knox has a history of CAD with ischemic cardiomyopathy. He is s/p stenting of the mid LAD in the past. He presents with progressive dyspnea on exertion which is his anginal equivalent.  Nuclear stress test on 05/10/2018 demonstrated moderate peri-infarct ischemia in the basal inferolateral and mid to basal segment.  There was mild peri-infarct ischemia in the mid to apical anteroseptal and particularly the mid segment.  It was deemed an intermediate risk study, LVEF 43%.  Echocardiogram demonstrated normal left ventricular systolic function with regional wall motion abnormalities, LVEF 50 to 55%, mild LVH, grade 1 diastolic dysfunction, calcified aortic annulus and calcified mitral annulus, with hypokinesis of the mid apical anteroseptal wall and apical inferior wall.  Cardiac cath performed on 06/15/18 showed patent LAD stent with CTO of the LCx and subtotal occlusion of the mid RCA. EF 45-50%. He was felt to be a candidate for CTO PCI of the RCA and LCx.   He continues to experience exertional dyspnea with activity such as doing yard work.  He denies exertional chest pain and tightness.  He has complaints of dizziness.  He was intolerant of isosorbide due to severe HA.   Past Medical History:  Diagnosis Date  . Arthritis    "right hip" (07/31/2016)  . Carotid artery disease ([place])    a. <50% bilaterally 05/2016.  Marland Kitchen Chronic combined systolic and diastolic CHF (congestive heart failure) ([place])   . Complication of anesthesia    "didn't wake up well after OR on  07/30/2016; took 2-3 to hold me down"  . Coronary artery disease    a. prior MI/stent around 2000. b. Nuc 09/2015 -> abnl, mgmd medically.  Marland Kitchen History of blood transfusion 07/30/2016   "after the operation"  . Hyperlipidemia   . Hypertension   . Ischemic cardiomyopathy   . Melanoma of nose ([place])    "inside on the right side"  . Myocardial infarction ([place]) ~ 2000  . Pneumonia 1935- 1970s ,"several times"  . Rocky Mountain spotted fever 1942  . Skin cancer    "on my back; arms; froze them off"  . Type 2 diabetes, diet controlled ([place])     Past Surgical History:  Procedure Laterality Date  . APPENDECTOMY    . BACK SURGERY    . CATARACT EXTRACTION, BILATERAL Bilateral   . COLONOSCOPY N/A 09/28/2015   Procedure: COLONOSCOPY;  Surgeon: Rogene Houston, MD;  [facility];  Service: Endoscopy;  Laterality: N/A;  955  . CORONARY ANGIOPLASTY WITH STENT PLACEMENT  2000  . CYSTOSCOPY W/ URETERAL STENT PLACEMENT    . EYE SURGERY Right 2013   "got hit by a limb & knocked my eye out"  . JOINT REPLACEMENT    . LEFT HEART CATH AND CORONARY ANGIOGRAPHY N/A 06/15/2018   Procedure: LEFT HEART CATH AND CORONARY ANGIOGRAPHY;  Surgeon: Martinique, Jabarri Stefanelli M, MD;  Location: [place] [facility];  Service: Cardiovascular;  Laterality: N/A;  . LUMBAR [place]  . MELANOMA EXCISION Right    "inside my nose"  . TONSILLECTOMY    .  TOTAL HIP ARTHROPLASTY Right 07/29/2016  . TOTAL HIP ARTHROPLASTY Right 07/29/2016   Procedure: TOTAL HIP ARTHROPLASTY;  Surgeon: Garald Balding, MD;  Location: [place];  Service: Orthopedics;  Laterality: Right;     Medications Prior to Admission: Prior to Admission medications   Medication Sig Start Date End Date Taking? Authorizing Provider  amitriptyline (ELAVIL) 50 MG tablet Take 50 mg by mouth at bedtime.   Yes [provider]  aspirin EC 81 MG tablet Take 81 mg by mouth daily.   Yes [provider]  clopidogrel (PLAVIX) 75 MG tablet Take 1  tablet (75 mg total) by mouth daily. 06/15/18  Yes Martinique, Sophy Mesler M, MD  losartan (COZAAR) 25 MG tablet TAKE 1 TABLET BY MOUTH ONCE DAILY. 04/30/18  Yes Herminio Commons, MD  metoprolol succinate (TOPROL-XL) 25 MG 24 hr tablet Take 25 mg by mouth daily.   Yes [provider]  simvastatin (ZOCOR) 40 MG tablet Take 20 mg by mouth every evening.    Yes [provider]     Allergies:   No Known Allergies  Social History:   Social History   Socioeconomic History  . Marital status: Married    Spouse name: Not on file  . Number of children: Not on file  . Years of education: Not on file  . Highest education level: Not on file  Occupational History  . Occupation: Retired  Scientific laboratory technician  . Financial resource strain: Not on file  . Food insecurity:    Worry: Not on file    Inability: Not on file  . Transportation needs:    Medical: Not on file    Non-medical: Not on file  Tobacco Use  . Smoking status: Former Smoker    Packs/day: 1.00    Years: 20.00    Pack years: 20.00    Types: Cigarettes    Start date: 12/30/1943  . Smokeless tobacco: Never Used  . Tobacco comment: "stopped smoking in 1970 when I got pneumonia"  Substance and Sexual Activity  . Alcohol use: No    Alcohol/week: 0.0 oz  . Drug use: No  . Sexual activity: Never  Lifestyle  . Physical activity:    Days per week: Not on file    Minutes per session: Not on file  . Stress: Not on file  Relationships  . Social connections:    Talks on phone: Not on file    Gets together: Not on file    Attends religious service: Not on file    Active member of club or organization: Not on file    Attends meetings of clubs or organizations: Not on file    Relationship status: Not on file  . Intimate partner violence:    Fear of current or ex partner: Not on file    Emotionally abused: Not on file    Physically abused: Not on file    Forced sexual activity: Not on file  Other Topics Concern  . Not on file    Social History Narrative  . Not on file    Family History:   The patient's family history includes Alzheimer's disease in his mother; Diabetes Mellitus II in his brother.    ROS:  Please see the history of present illness.  All other ROS reviewed and negative.     Physical Exam/Data:   Vitals:   07/15/18 0746  BP: (!) 142/74  Pulse: 66  Temp: 97.6 F (36.4 C)  TempSrc: Oral  SpO2: 97%  Weight: 210 lb (95.3 kg)  Height: 6' (1.829 m)   No intake or output data in the 24 hours ending 07/15/18 0853 Filed Weights   07/15/18 0746  Weight: 210 lb (95.3 kg)   Body mass index is 28.48 kg/m.  General:  Well nourished, well developed, in no acute distress HEENT: normal Lymph: no adenopathy Neck: no JVD Endocrine:  No thryomegaly Vascular: No carotid bruits; FA pulses 2+ bilaterally without bruits  Cardiac:  normal S1, S2; RRR; no murmur  Lungs:  clear to auscultation bilaterally, no wheezing, rhonchi or rales  Abd: soft, nontender, no hepatomegaly  Ext: no edema Musculoskeletal:  No deformities, BUE and BLE strength normal and equal Skin: warm and dry  Neuro:  CNs 2-12 intact, no focal abnormalities noted Psych:  Normal affect    EKG:  The ECG that was done 06/15/18 was personally reviewed and demonstrates NSR with old inferior infarct  Relevant CV Studies: As noted above  Laboratory Data:  Chemistry Recent Labs  Lab 07/13/18 1104  NA 137  K 4.2  CL 104  CO2 27  GLUCOSE 129*  BUN 15  CREATININE 1.15  CALCIUM 9.0  GFRNONAA 57*  GFRAA >60  ANIONGAP 6    No results for input(s): PROT, ALBUMIN, AST, ALT, ALKPHOS, BILITOT in the last 168 hours. Hematology Recent Labs  Lab 07/13/18 1104  WBC 10.2  RBC 4.53  HGB 14.4  HCT 41.7  MCV 92.1  MCH 31.8  MCHC 34.5  RDW 13.2  PLT 178   Cardiac EnzymesNo results for input(s): TROPONINI in the last 168 hours. No results for input(s): TROPIPOC in the last 168 hours.  BNPNo results for input(s): BNP, PROBNP in  the last 168 hours.  DDimer No results for input(s): DDIMER in the last 168 hours.  Radiology/Studies:  No results found.  Assessment and Plan:   1. CAD with ischemic CM and class 2-3 angina. Medical therapy limited due to side effects. Cardiac cath showed severe 2 vessel disease with CTO of the proximal LCx and mid RCA. Options for treatment reviewed with patient including medical Rx, CTO PCI, or CABG. Patient desires to proceed with PCI. The procedure and risks were reviewed including but not limited to death, myocardial infarction, stroke, arrythmias, bleeding, transfusion, emergency surgery, dye allergy, or renal dysfunction. The patient voices understanding and is agreeable to proceed. 2. HTN  3. Hyperlipidemia. Last LDL 68.  4. Carotid arterial disease.    For questions or updates, please contact [place] Please consult [email or web address] for contact info under Cardiology/STEMI.    Signed, Daune Divirgilio Martinique, MD  07/15/2018 8:53 AM

## 2018-07-15 NOTE — Progress Notes (Addendum)
NS 250cc bolus started. Level 2 hematoma rt groin

## 2018-07-15 NOTE — Progress Notes (Addendum)
Site area: rt groin fa sheath; hold taken over by [employer] at 1745 d/t shifting hematoma below stick site Site Prior to Removal:  Level 1 Pressure Applied For: 1 hour 15 minutes Manual:   yes Patient Status During Pull:  HR and BP dropped, responded to fluid bolus Post Pull Site:  Level 2. Hematoma soft. 7.5 inches by 3.5 inches. 2.5 inch thin skin tear in skin crease. Post Pull Instructions Given:  yes Post Pull Pulses Present:  Rt pt 1+ palpable Dressing Applied:  Gauze and tegaderm Bedrest begins @ 1835 Comments: Dr. Irish Lack here to see patient. Hematoma mostly resolved. Deep blue-purple bruising mostly below and medial to stick site. OK to stay on 6c.

## 2018-07-15 NOTE — Progress Notes (Signed)
Hematoma rt groin shifting medial, lateral and below puncture site. Deepening bruising. Dr. Irish Lack paged and made aware. Manual pressure continues.

## 2018-07-15 NOTE — Interval H&P Note (Signed)
History and Physical Interval Note:  07/15/2018 12:06 PM  Jimmy Knox  has presented today for surgery, with the diagnosis of cad  The various methods of treatment have been discussed with the patient and family. After consideration of risks, benefits and other options for treatment, the patient has consented to  Procedure(s): CORONARY CTO INTERVENTION (N/A) as a surgical intervention .  The patient's history has been reviewed, patient examined, no change in status, stable for surgery.  I have reviewed the patient's chart and labs.  Questions were answered to the patient's satisfaction.    Cath Lab Visit (complete for each Cath Lab visit)  Clinical Evaluation Leading to the Procedure:   ACS: No.  Non-ACS:    Anginal Classification: CCS III  Anti-ischemic medical therapy: Maximal Therapy (2 or more classes of medications)  Non-Invasive Test Results: Intermediate-risk stress test findings: cardiac mortality 1-3%/year  Prior CABG: No previous CABG       Jimmy Knox 07/15/2018 12:06 PM

## 2018-07-15 NOTE — Progress Notes (Signed)
Robbie Lis, PA here.

## 2018-07-15 NOTE — Progress Notes (Addendum)
1800:  Hematoma rt groin shifting medial toward groin, lateral toward hip and below puncture site. Deepening bruising. Dr. Irish Lack paged and made aware and is coming in . Manual pressure continues with Fabian Sharp and [employer].

## 2018-07-15 NOTE — Progress Notes (Signed)
ACT 186. Dr. Irish Lack made aware of ACT and hematoma. OK to pull sheaths.

## 2018-07-15 NOTE — Progress Notes (Addendum)
Site area: left groin fa sheath pulled and pressure held by Kizzie Furnish Site Prior to Removal:  Level 1 Pressure Applied For: 30 minutes Manual:   yes Patient Status During Pull:  stable Post Pull Site:  Level hematoma soft, resolved. Faint bruising Post Pull Instructions Given:  yes Post Pull Pulses Present: left dp 2+ Dressing Applied:  Gauze and tegaderm Bedrest begins @  Comments:

## 2018-07-16 ENCOUNTER — Encounter (HOSPITAL_COMMUNITY): Payer: Self-pay | Admitting: Cardiology

## 2018-07-16 DIAGNOSIS — Z7982 Long term (current) use of aspirin: Secondary | ICD-10-CM | POA: Diagnosis not present

## 2018-07-16 DIAGNOSIS — I209 Angina pectoris, unspecified: Secondary | ICD-10-CM | POA: Diagnosis not present

## 2018-07-16 DIAGNOSIS — I97630 Postprocedural hematoma of a circulatory system organ or structure following a cardiac catheterization: Secondary | ICD-10-CM | POA: Diagnosis not present

## 2018-07-16 DIAGNOSIS — I5042 Chronic combined systolic (congestive) and diastolic (congestive) heart failure: Secondary | ICD-10-CM | POA: Diagnosis not present

## 2018-07-16 DIAGNOSIS — I255 Ischemic cardiomyopathy: Secondary | ICD-10-CM | POA: Diagnosis not present

## 2018-07-16 DIAGNOSIS — I2584 Coronary atherosclerosis due to calcified coronary lesion: Secondary | ICD-10-CM | POA: Diagnosis not present

## 2018-07-16 DIAGNOSIS — I10 Essential (primary) hypertension: Secondary | ICD-10-CM | POA: Diagnosis not present

## 2018-07-16 DIAGNOSIS — E119 Type 2 diabetes mellitus without complications: Secondary | ICD-10-CM | POA: Diagnosis not present

## 2018-07-16 DIAGNOSIS — I25118 Atherosclerotic heart disease of native coronary artery with other forms of angina pectoris: Secondary | ICD-10-CM | POA: Diagnosis not present

## 2018-07-16 DIAGNOSIS — Z7902 Long term (current) use of antithrombotics/antiplatelets: Secondary | ICD-10-CM | POA: Diagnosis not present

## 2018-07-16 DIAGNOSIS — I2582 Chronic total occlusion of coronary artery: Secondary | ICD-10-CM | POA: Diagnosis not present

## 2018-07-16 DIAGNOSIS — I252 Old myocardial infarction: Secondary | ICD-10-CM | POA: Diagnosis not present

## 2018-07-16 DIAGNOSIS — I110 Hypertensive heart disease with heart failure: Secondary | ICD-10-CM | POA: Diagnosis not present

## 2018-07-16 DIAGNOSIS — E7800 Pure hypercholesterolemia, unspecified: Secondary | ICD-10-CM | POA: Diagnosis not present

## 2018-07-16 DIAGNOSIS — E785 Hyperlipidemia, unspecified: Secondary | ICD-10-CM | POA: Diagnosis not present

## 2018-07-16 LAB — BASIC METABOLIC PANEL
Anion gap: 7 (ref 5–15)
BUN: 9 mg/dL (ref 8–23)
CALCIUM: 8.5 mg/dL — AB (ref 8.9–10.3)
CO2: 27 mmol/L (ref 22–32)
CREATININE: 1.09 mg/dL (ref 0.61–1.24)
Chloride: 105 mmol/L (ref 98–111)
GFR calc Af Amer: >60 mL/min (ref >60)
GFR calc non Af Amer: >60 mL/min (ref >60)
GLUCOSE: 113 mg/dL — AB (ref 70–99)
Potassium: 3.8 mmol/L (ref 3.5–5.1)
Sodium: 139 mmol/L (ref 135–145)

## 2018-07-16 LAB — CBC
HCT: 38.7 % — ABNORMAL LOW (ref 39.0–52.0)
Hemoglobin: 12.8 g/dL — ABNORMAL LOW (ref 13.0–17.0)
MCH: 30.8 pg (ref 26.0–34.0)
MCHC: 33.1 g/dL (ref 30.0–36.0)
MCV: 93 fL (ref 78.0–100.0)
PLATELETS: 169 10*3/uL (ref 150–400)
RBC: 4.16 MIL/uL — ABNORMAL LOW (ref 4.22–5.81)
RDW: 13.2 % (ref 11.5–15.5)
WBC: 10.5 10*3/uL (ref 4.0–10.5)

## 2018-07-16 MED ORDER — NITROGLYCERIN 0.4 MG SL SUBL: 0.4000 mg | SUBLINGUAL_TABLET | SUBLINGUAL | 2 refills | Status: DC | PRN

## 2018-07-16 MED ORDER — CLOPIDOGREL BISULFATE 75 MG PO TABS: 75.0000 mg | ORAL_TABLET | Freq: Every day | ORAL | 3 refills | Status: DC

## 2018-07-16 MED FILL — Nitroglycerin IV Soln 100 MCG/ML in D5W: INTRA_ARTERIAL | Qty: 10 | Status: AC

## 2018-07-16 NOTE — Progress Notes (Addendum)
Progress Note  Patient Name: Jimmy Knox Date of Encounter: 07/16/2018  Primary Cardiologist: Kate Sable, MD   Subjective   Doing well this morning. No CP or dyspnea. Groin with large area of ecchymosis but soft, non-tender. He is waiting to ambulate w/ cardiac rehab.   Inpatient Medications    Scheduled Meds: . amitriptyline  50 mg Oral QHS  . aspirin EC  81 mg Oral Daily  . clopidogrel  75 mg Oral Daily  . losartan  25 mg Oral Daily  . metoprolol succinate  25 mg Oral Daily  . simvastatin  20 mg Oral QPM  . sodium chloride flush  3 mL Intravenous Q12H   Continuous Infusions: . sodium chloride     PRN Meds: sodium chloride, acetaminophen, ondansetron (ZOFRAN) IV, sodium chloride flush   Vital Signs    Vitals:   07/15/18 2000 07/15/18 2030 07/16/18 0348 07/16/18 0718  BP: (!) 119/59 125/67 129/70 102/65  Pulse: 82 81 83 84  Resp: (!) 29 19 (!) 21 20  Temp:   98.2 F (36.8 C) (!) 97.4 F (36.3 C)  TempSrc:   Oral   SpO2: 97% 96% 96% 94%  Weight:   222 lb 7.1 oz (100.9 kg)   Height:        Intake/Output Summary (Last 24 hours) at 07/16/2018 0814 Last data filed at 07/16/2018 0035 Gross per 24 hour  Intake 1520.91 ml  Output 480 ml  Net 1040.91 ml   Filed Weights   07/15/18 0746 07/16/18 0348  Weight: 210 lb (95.3 kg) 222 lb 7.1 oz (100.9 kg)    Telemetry    NSR - Personally Reviewed  ECG    NSR - Personally Reviewed  Physical Exam   GEN: No acute distress.   Neck: No JVD Cardiac: RRR, no murmurs, rubs, or gallops.  Respiratory: Clear to auscultation bilaterally. GI: Soft, nontender, non-distended  MS: No edema; No deformity. Ecchymosis noted on anterior right groin, soft, non-tender Neuro:  Nonfocal  Psych: Normal affect   Labs    Chemistry Recent Labs  Lab 07/13/18 1104 07/16/18 0342  NA 137 139  K 4.2 3.8  CL 104 105  CO2 27 27  GLUCOSE 129* 113*  BUN 15 9  CREATININE 1.15 1.09  CALCIUM 9.0 8.5*  GFRNONAA 57* >60    GFRAA >60 >60  ANIONGAP 6 7     Hematology Recent Labs  Lab 07/13/18 1104 07/16/18 0342  WBC 10.2 10.5  RBC 4.53 4.16*  HGB 14.4 12.8*  HCT 41.7 38.7*  MCV 92.1 93.0  MCH 31.8 30.8  MCHC 34.5 33.1  RDW 13.2 13.2  PLT 178 169    Cardiac EnzymesNo results for input(s): TROPONINI in the last 168 hours. No results for input(s): TROPIPOC in the last 168 hours.   BNPNo results for input(s): BNP, PROBNP in the last 168 hours.   DDimer No results for input(s): DDIMER in the last 168 hours.   Radiology    No results found.  Cardiac Studies   LHC 07/15/18 Procedures   CORONARY CTO INTERVENTION  Conclusion     Ost Cx to Prox Cx lesion is 100% stenosed.  Post intervention, there is a 100% residual stenosis.  Prox RCA lesion is 90% stenosed.  Post intervention, there is a 0% residual stenosis.  Prox RCA to Mid RCA lesion is 100% stenosed.  Post intervention, there is a 0% residual stenosis.  A drug-eluting stent was successfully placed using a STENT SYNERGY DES  3.4I34.   1. Successful CTO PCI of the mid RCA with DES x 1. 2. Unsuccessful CTO PCI of the proximal LCx due to inability to cross with a wire.   Recommend uninterrupted dual antiplatelet therapy with Aspirin 81mg  daily and Clopidogrel 75mg  daily for a minimum of 12 months (ACS - Class I recommendation).      Patient Profile     Mr. Jimmy Knox has a history of CAD with ischemic cardiomyopathy. He is s/p stenting of the mid LAD in the past. He recently presented to the office with progressive dyspnea on exertion which is his anginal equivalent. Cardiac cath performed on 06/15/18 showed patent LAD stent with CTO of the LCx and subtotal occlusion of the mid RCA. EF 45-50%. He was felt to be a candidate for CTO PCI of the RCA and LCx.   Pt admitted 07/15/18 for planned CTO PCI of the RCA and LCx.   Assessment & Plan    1. CAD: s/p remote LAD stenting (patent on recent cath 05/2018). Cath in June also showed  CTO of the LCx and subtotal occlusion of the mid RCA. Pt has successful CTO PCI of the mid RCA w/ DESx1 however unsuccessful CTO PCI of the proximal LCx due to inability to cross with a wire. He is CP free. VSS. No resting dyspnea. Large area of ecchymosis at the right femoral cath access site, but it is soft and non-tender. Slight drop in Hgb from 14>>12.  We will have him ambulate with cardiac rehab prior to discharge. Recs are to continue DAPT w/ ASA and Plavix for a minimum of 12 months. Also continue BB, statin and ARB. Pt advised against heavy lifting for the next week.   Dispo: home today if stable after ambulating with cardiac rehab.   For questions or updates, please contact [place] Please consult [email or web address] for contact info under Cardiology/STEMI.      Signed, Lyda Jester, PA-C  07/16/2018, 8:14 AM    I have examined the patient and reviewed assessment and plan and discussed with patient.  Agree with above as stated. Continue DAPT for 1 year along with aggressive secondary prevention. RIght groin stable, but bruised.  No hematoma palpable but there is bruising on both groin sites.  No bruit noted on the right, which was the site that had the most difficulty last night. OK to discharge after walking with rehab.  Continue meds for HTN nad hyperlipidemia.  Plan to discharge later today.    Larae Grooms

## 2018-07-16 NOTE — Discharge Summary (Addendum)
Discharge Summary    Patient ID: Jimmy Knox,  MRN: [id number], DOB/AGE: November 26, 1932 84 y.o.  Admit date: 07/15/2018 Discharge date: 07/16/2018  Primary Care Provider: Asencion Noble Primary Cardiologist: Kate Sable, MD  Discharge Diagnoses    Principal Problem:   Angina pectoris [facility]) Active Problems:   Arteriosclerotic cardiovascular disease (ASCVD)   Essential hypertension   Hyperlipidemia   Cardiomyopathy, ischemic   Allergies No Known Allergies  Diagnostic Studies/Procedures    LHC 07/15/18 Procedures   CORONARY CTO INTERVENTION  Conclusion     Ost Cx to Prox Cx lesion is 100% stenosed.  Post intervention, there is a 100% residual stenosis.  Prox RCA lesion is 90% stenosed.  Post intervention, there is a 0% residual stenosis.  Prox RCA to Mid RCA lesion is 100% stenosed.  Post intervention, there is a 0% residual stenosis.  A drug-eluting stent was successfully placed using a STENT SYNERGY DES 3.5X38.  1. Successful CTO PCI of the mid RCA with DES x 1. 2. Unsuccessful CTO PCI of the proximal LCx due to inability to cross with a wire.   Recommend uninterrupted dual antiplatelet therapy with Aspirin 81mg  daily and Clopidogrel 75mg  dailyfor a minimum of 12 months (ACS - Class I recommendation).       History of Present Illness     [name] a history of CAD with ischemic cardiomyopathy. He is s/p stenting of the mid LAD in the past. He recently presented to the office with progressive dyspnea on exertion which is his anginal equivalent.Cardiac cath performed on 06/15/18 showed patent LAD stent with CTO of the LCx and subtotal occlusion of the mid RCA. EF 45-50%. He was felt to be a candidate for CTO PCI of the RCA and LCx.  Pt admitted 07/15/18 for planned CTO PCI of the RCA and LCx.   Hospital Course   Pt presented to [facility] on 07[facility]15[facility]18 for the planned procedure, performed by Dr. Martinique. Access obtained via the right femoral  artery. Pt had successful CTO PCI of the mid RCA w/ DESx1 however unsuccessful CTO PCI of the proximal LCx due to inability to cross with a wire. He tolerated the procedure well and left the cath lab in stable condition. He was monitored overnight. He developed a right groin hematoma and additional manual pressure was applied and hemostasis achieved. He had residual ecchymosis surrounding cath site, but the area was soft and non-tender. He denied flank and low back pain. Slight drop in Hgb from 14-12. VSS. Renal function stable. He denied any recurrent CP. He ambulated with cardiac rehab w/o difficulty. He was last seen and examined by Dr. Irish Lack, who determined he was stable for discharge. He was discharged home on DAPT w/ ASA and Plavix, to be continued for a minimum of 12 months, along with BB, statin and ARB. Post hospital f/u will be arranged with Dr. [name].    Consultants: none    Discharge Vitals Blood pressure 102/65, pulse 84, temperature (!) 97.4 F (36.3 C), resp. rate 20, height 6' (1.829 m), weight 222 lb 7.1 oz (100.9 kg), SpO2 94 %.  Filed Weights   07/15/18 0746 07/16/18 0348  Weight: 210 lb (95.3 kg) 222 lb 7.1 oz (100.9 kg)    Labs & Radiologic Studies    CBC Recent Labs    07/13/18 1104 07/16/18 0342  WBC 10.2 10.5  HGB 14.4 12.8*  HCT 41.7 38.7*  MCV 92.1 93.0  PLT 178 956   Basic Metabolic Panel Recent  Labs    07/13/18 1104 07/16/18 0342  NA 137 139  K 4.2 3.8  CL 104 105  CO2 27 27  GLUCOSE 129* 113*  BUN 15 9  CREATININE 1.15 1.09  CALCIUM 9.0 8.5*   Liver Function Tests No results for input(s): AST, ALT, ALKPHOS, BILITOT, PROT, ALBUMIN in the last 72 hours. No results for input(s): LIPASE, AMYLASE in the last 72 hours. Cardiac Enzymes No results for input(s): CKTOTAL, CKMB, CKMBINDEX, TROPONINI in the last 72 hours. BNP Invalid input(s): POCBNP D-Dimer No results for input(s): DDIMER in the last 72 hours. Hemoglobin A1C No results for  input(s): HGBA1C in the last 72 hours. Fasting Lipid Panel No results for input(s): CHOL, HDL, LDLCALC, TRIG, CHOLHDL, LDLDIRECT in the last 72 hours. Thyroid Function Tests No results for input(s): TSH, T4TOTAL, T3FREE, THYROIDAB in the last 72 hours.  Invalid input(s): FREET3 _____________  No results found. Disposition   Pt is being discharged home today in good condition.  Follow-up Plans & Appointments    Follow-up Information    Herminio Commons, MD Follow up on 07/29/2018.   Specialty:  Cardiology Why:  10:40 AM  Contact information: [place] [address] [phone]          Discharge Instructions    AMB Referral to Cardiac Rehabilitation - Phase II   Complete by:  As directed    Diagnosis:  Coronary Stents   Diet - low sodium heart healthy   Complete by:  As directed    Increase activity slowly   Complete by:  As directed       Discharge Medications   Allergies as of 07/16/2018   No Known Allergies     Medication List    TAKE these medications   amitriptyline 50 MG tablet Commonly known as:  ELAVIL Take 50 mg by mouth at bedtime.   aspirin EC 81 MG tablet Take 81 mg by mouth daily.   clopidogrel 75 MG tablet Commonly known as:  PLAVIX Take 1 tablet (75 mg total) by mouth daily.   losartan 25 MG tablet Commonly known as:  COZAAR TAKE 1 TABLET BY MOUTH ONCE DAILY.   metoprolol succinate 25 MG 24 hr tablet Commonly known as:  TOPROL-XL Take 25 mg by mouth daily.   nitroGLYCERIN 0.4 MG SL tablet Commonly known as:  NITROSTAT Place 1 tablet (0.4 mg total) under the tongue every 5 (five) minutes as needed for chest pain.   simvastatin 40 MG tablet Commonly known as:  ZOCOR Take 20 mg by mouth every evening.        Acute coronary syndrome (MI, NSTEMI, STEMI, etc) this admission?: No.    Outstanding Labs/Studies   none  Duration of Discharge Encounter   Greater than 30 minutes including physician  time.  Signed, Lyda Jester, PA-C 07/16/2018, 9:20 AM  I have examined the patient and reviewed assessment and plan and discussed with patient.  Agree with above as stated. Continue DAPT for 1 year along with aggressive secondary prevention. RIght groin stable, but bruised.  No hematoma palpable but there is bruising on both groin sites.  No bruit noted on the right, which was the site that had the most difficulty last night. OK to discharge after walking with rehab.  Continue meds for HTN nad hyperlipidemia.  Plan to discharge later today.    Larae Grooms

## 2018-07-16 NOTE — Progress Notes (Signed)
CARDIAC REHAB PHASE I   PRE:  Rate/Rhythm: 87 SR  BP:  Sitting: 101/57      SaO2: 98 RA  MODE:  Ambulation: 450 ft 101 peak HR  POST:  Rate/Rhythm: 95 SR  BP:  Sitting: 107/53    SaO2: 96 RA   Pt ambulated 443ft in hallway assist of one with gait belt, shaky gait. Pt states he feels a little weak and c/o slight SOB. Pt educated on importance of Plavix, ASA, and NTG. Pt states his wife has his stent card. Pt given heart healthy, diabetic, and low sodium diets. Reviewed restrictions and exercise guidelines with pt. Will refer to CRP II [place].   [id number] Rufina Falco, RN BSN 07/16/2018 9:21 AM

## 2018-07-22 DIAGNOSIS — E119 Type 2 diabetes mellitus without complications: Secondary | ICD-10-CM | POA: Diagnosis not present

## 2018-07-22 DIAGNOSIS — Z79899 Other long term (current) drug therapy: Secondary | ICD-10-CM | POA: Diagnosis not present

## 2018-07-22 DIAGNOSIS — I2510 Atherosclerotic heart disease of native coronary artery without angina pectoris: Secondary | ICD-10-CM | POA: Diagnosis not present

## 2018-07-29 ENCOUNTER — Encounter: Payer: Self-pay | Admitting: Cardiovascular Disease

## 2018-07-29 ENCOUNTER — Encounter (INDEPENDENT_AMBULATORY_CARE_PROVIDER_SITE_OTHER): Payer: Self-pay

## 2018-07-29 ENCOUNTER — Ambulatory Visit (INDEPENDENT_AMBULATORY_CARE_PROVIDER_SITE_OTHER): Payer: Medicare Other | Admitting: Cardiovascular Disease

## 2018-07-29 VITALS — BP 103/67 | HR 89 | Ht 73.0 in | Wt 208.2 lb

## 2018-07-29 DIAGNOSIS — I25118 Atherosclerotic heart disease of native coronary artery with other forms of angina pectoris: Secondary | ICD-10-CM | POA: Diagnosis not present

## 2018-07-29 DIAGNOSIS — I779 Disorder of arteries and arterioles, unspecified: Secondary | ICD-10-CM | POA: Diagnosis not present

## 2018-07-29 DIAGNOSIS — E785 Hyperlipidemia, unspecified: Secondary | ICD-10-CM | POA: Diagnosis not present

## 2018-07-29 DIAGNOSIS — I255 Ischemic cardiomyopathy: Secondary | ICD-10-CM

## 2018-07-29 DIAGNOSIS — I739 Peripheral vascular disease, unspecified: Secondary | ICD-10-CM

## 2018-07-29 DIAGNOSIS — I10 Essential (primary) hypertension: Secondary | ICD-10-CM | POA: Diagnosis not present

## 2018-07-29 DIAGNOSIS — R400 Somnolence: Secondary | ICD-10-CM | POA: Diagnosis not present

## 2018-07-29 DIAGNOSIS — Z955 Presence of coronary angioplasty implant and graft: Secondary | ICD-10-CM | POA: Diagnosis not present

## 2018-07-29 DIAGNOSIS — E119 Type 2 diabetes mellitus without complications: Secondary | ICD-10-CM | POA: Diagnosis not present

## 2018-07-29 NOTE — Patient Instructions (Signed)
Your physician recommends that you schedule a follow-up appointment in: 4 months with [place]    Your physician recommends that you continue on your current medications as directed. Please refer to the Current Medication list given to you today.    If you need a refill on your cardiac medications before your next appointment, please call your pharmacy.     You have been referred to cardiac rehab, they will call you for apt    Thank you for choosing [facility] !

## 2018-07-29 NOTE — Progress Notes (Signed)
SUBJECTIVE: The patient presents for routine follow-up.  He underwent CTO PCI of the mid RCA with a drug-eluting stent on 07/15/2018.  He underwent unsuccessful CTO PCI of the proximal left circumflex due to inability to cross with a wire.  It was recommended he have uninterrupted dual antiplatelet therapy with aspirin 81 mg daily and clopidogrel 75 mg daily for a minimum of 12 months.  He was recently discharged and has not had much physical activity except for walking from one room to the next in his house.  He wants to get back to mowing the lawn with a riding mower and weed eating.  He denies chest pain.  Exertional dyspnea which is chronic is stable.  He denies palpitations.    SocHx: Married for [employer] 2019. Has 2 children and 3 granddaughters who live fairly close to them.   Review of Systems: As per "subjective", otherwise negative.  No Known Allergies  Current Outpatient Medications  Medication Sig Dispense Refill  . amitriptyline (ELAVIL) 50 MG tablet Take 50 mg by mouth at bedtime.    Marland Kitchen aspirin EC 81 MG tablet Take 81 mg by mouth daily.    . clopidogrel (PLAVIX) 75 MG tablet Take 1 tablet (75 mg total) by mouth daily. 90 tablet 3  . losartan (COZAAR) 25 MG tablet TAKE 1 TABLET BY MOUTH ONCE DAILY. 90 tablet 3  . metoprolol succinate (TOPROL-XL) 25 MG 24 hr tablet Take 25 mg by mouth daily.    . nitroGLYCERIN (NITROSTAT) 0.4 MG SL tablet Place 1 tablet (0.4 mg total) under the tongue every 5 (five) minutes as needed for chest pain. 25 tablet 2  . simvastatin (ZOCOR) 40 MG tablet Take 20 mg by mouth every evening.      No current facility-administered medications for this visit.     Past Medical History:  Diagnosis Date  . Anxiety   . Arthritis    "right hip" (07/15/2018)  . Carotid artery disease ([place])    a. <50% bilaterally 05/2016.  Marland Kitchen Chronic combined systolic and diastolic CHF (congestive heart failure) ([place])   . Complication of anesthesia    "didn't  wake up well after OR on 07/30/2016; took 2-3 to hold me down"  . Coronary artery disease    a. prior MI/stent around 2000. b. Nuc 09/2015 -> abnl, mgmd medically.  . Facial neuralgia   . History of blood transfusion 07/30/2016   "after the operation"  . Hyperlipidemia   . Hypertension   . Ischemic cardiomyopathy   . Melanoma of nose ([place])    "inside on the right side"  . Myocardial infarction ([place]) ~ 2000  . Pneumonia 1935- 1970s ,"several times"  . [place] spotted fever 1942  . Skin cancer    "on my back; arms; froze them off"  . Type 2 diabetes, diet controlled ([place])     Past Surgical History:  Procedure Laterality Date  . APPENDECTOMY    . BACK SURGERY    . CATARACT EXTRACTION, BILATERAL Bilateral   . COLONOSCOPY N/A 09/28/2015   Procedure: COLONOSCOPY;  Surgeon: Rogene Houston, MD;  Location: [facility];  Service: Endoscopy;  Laterality: N/A;  955  . CORONARY ANGIOPLASTY WITH STENT PLACEMENT  2000  . CORONARY CTO INTERVENTION  07/15/2018  . CORONARY CTO INTERVENTION N/A 07/15/2018   Procedure: CORONARY CTO INTERVENTION;  Surgeon: Martinique, Peter M, MD;  Location: [facility] LAB;  Service: Cardiovascular;  Laterality: N/A;  . CYSTOSCOPY W/  URETERAL STENT PLACEMENT    . EYE SURGERY Right 2013   "got hit by a limb & knocked my eye out"  . JOINT REPLACEMENT    . LEFT HEART CATH AND CORONARY ANGIOGRAPHY N/A 06/15/2018   Procedure: LEFT HEART CATH AND CORONARY ANGIOGRAPHY;  Surgeon: Martinique, Peter M, MD;  Location: [facility];  Service: Cardiovascular;  Laterality: N/A;  . LUMBAR [place]  . MELANOMA EXCISION Right    "inside my nose"  . TONSILLECTOMY    . TOTAL HIP ARTHROPLASTY Right 07/29/2016   Procedure: TOTAL HIP ARTHROPLASTY;  Surgeon: Garald Balding, MD;  Location: [facility];  Service: Orthopedics;  Laterality: Right;    Social History   Socioeconomic History  . Marital status: Married    Spouse name: Not on file  . Number of children: Not  on file  . Years of education: Not on file  . Highest education level: Not on file  Occupational History  . Occupation: Retired  Scientific laboratory technician  . Financial resource strain: Not on file  . Food insecurity:    Worry: Not on file    Inability: Not on file  . Transportation needs:    Medical: Not on file    Non-medical: Not on file  Tobacco Use  . Smoking status: Former Smoker    Packs/day: 1.00    Years: 20.00    Pack years: 20.00    Types: Cigarettes    Start date: 12/30/1943  . Smokeless tobacco: Never Used  . Tobacco comment: "stopped smoking in 1970s when I got pneumonia"  Substance and Sexual Activity  . Alcohol use: Never    Alcohol/week: 0.0 oz    Frequency: Never  . Drug use: Never  . Sexual activity: Not Currently  Lifestyle  . Physical activity:    Days per week: Not on file    Minutes per session: Not on file  . Stress: Not on file  Relationships  . Social connections:    Talks on phone: Not on file    Gets together: Not on file    Attends religious service: Not on file    Active member of club or organization: Not on file    Attends meetings of clubs or organizations: Not on file    Relationship status: Not on file  . Intimate partner violence:    Fear of current or ex partner: Not on file    Emotionally abused: Not on file    Physically abused: Not on file    Forced sexual activity: Not on file  Other Topics Concern  . Not on file  Social History Narrative  . Not on file     Vitals:   07/29/18 1013  BP: 103/67  Pulse: 89  SpO2: 98%  Weight: 208 lb 3.2 oz (94.4 kg)  Height: 6\' 1"  (1.854 m)    Wt Readings from Last 3 Encounters:  07/29/18 208 lb 3.2 oz (94.4 kg)  07/16/18 222 lb 7.1 oz (100.9 kg)  06/15/18 209 lb (94.8 kg)     PHYSICAL EXAM General: NAD HEENT: Normal. Neck: No JVD, no thyromegaly. Lungs: Faint bilateral diffuse crackles.   CV: Regular rate and rhythm, normal S1/S2, no S3/S4, 1/6 apical holosystolic murmur. No pretibial or  periankle edema. Abdomen: Soft, nontender, no distention.  Neurologic: Alert and oriented.  Psych: Normal affect. Skin: Normal. Musculoskeletal: No gross deformities.    ECG: Reviewed above under Subjective   Labs: Lab Results  Component Value Date/Time  K 3.8 07/16/2018 03:42 AM   BUN 9 07/16/2018 03:42 AM   CREATININE 1.09 07/16/2018 03:42 AM   ALT 23 07/21/2016 01:50 PM   HGB 12.8 (L) 07/16/2018 03:42 AM     Lipids: No results found for: LDLCALC, LDLDIRECT, CHOL, TRIG, HDL     ASSESSMENT AND PLAN: 1.  Coronary artery disease: Symptomatically stable.  Status post CTO PCI of the mid RCA on 07/15/2018.  Continue dual antiplatelet therapy with aspirin and comparable for a minimum of 12 months.  Continue losartan, Toprol-XL, and simvastatin.  We had a long talk about the benefits of cardiac rehabilitation with respect to symptom improvement.  I will make a referral.  2.  Hypertension: Controlled on present therapy.  No changes.  3.  Hyperlipidemia: Continue simvastatin 40 mg.  I will obtain a copy of lipids from PCP.  4. Carotid artery disease: Carotid Dopplers 06/20/16 less than 50% internal carotid artery stenosis bilaterally. Would repeat in 2+years.  5.Daytime somnolence with possible sleep-disordered breathing: Ipreviouslyrecommended a sleep study. He may have central sleep apnea. His wife recommends he get it done but he defers. I asked him to call me back if he changes his mind. I did inform him of the risks associated with sleep apnea including MI, CVA, and arrhythmias.      Disposition: Follow up 4 months   Kate Sable, M.D., F.A.C.C.

## 2018-09-15 ENCOUNTER — Encounter (HOSPITAL_COMMUNITY): Payer: Self-pay

## 2018-09-15 ENCOUNTER — Encounter (HOSPITAL_COMMUNITY)
Admission: RE | Admit: 2018-09-15 | Discharge: 2018-09-15 | Disposition: A | Discharge status: Home / Self Care | Payer: Medicare Other | Source: Ambulatory Visit | Attending: Cardiovascular Disease | Admitting: Cardiovascular Disease

## 2018-09-15 VITALS — BP 92/50 | HR 75 | Ht 72.0 in | Wt 209.9 lb

## 2018-09-15 DIAGNOSIS — Z955 Presence of coronary angioplasty implant and graft: Secondary | ICD-10-CM | POA: Diagnosis not present

## 2018-09-15 NOTE — Progress Notes (Signed)
Cardiac Individual Treatment Plan  Patient Details  Name: Jimmy Knox MRN: [id number] Date of Birth: 01/19/1932 Referring Provider:     [place] from 09/15/2018 in [place]  Referring Provider  [name]      Initial Encounter Date:    CARDIAC REHAB PHASE II ORIENTATION from 09/15/2018 in [place]  Date  09/15/18      Visit Diagnosis: Status post coronary artery stent placement  Patient's Home Medications on Admission:  Current Outpatient Medications:  .  amitriptyline (ELAVIL) 50 MG tablet, Take 50 mg by mouth at bedtime., Disp: , Rfl:  .  aspirin EC 81 MG tablet, Take 81 mg by mouth daily., Disp: , Rfl:  .  clopidogrel (PLAVIX) 75 MG tablet, Take 1 tablet (75 mg total) by mouth daily., Disp: 90 tablet, Rfl: 3 .  losartan (COZAAR) 25 MG tablet, TAKE 1 TABLET BY MOUTH ONCE DAILY., Disp: 90 tablet, Rfl: 3 .  metoprolol succinate (TOPROL-XL) 25 MG 24 hr tablet, Take 25 mg by mouth daily., Disp: , Rfl:  .  nitroGLYCERIN (NITROSTAT) 0.4 MG SL tablet, Place 1 tablet (0.4 mg total) under the tongue every 5 (five) minutes as needed for chest pain., Disp: 25 tablet, Rfl: 2 .  simvastatin (ZOCOR) 40 MG tablet, Take 40 mg by mouth every evening. , Disp: , Rfl:   Past Medical History: Past Medical History:  Diagnosis Date  . Anxiety   . Arthritis    "right hip" (07/15/2018)  . Carotid artery disease ([place])    a. <50% bilaterally 05/2016.  Marland Kitchen Chronic combined systolic and diastolic CHF (congestive heart failure) ([place])   . Complication of anesthesia    "didn't wake up well after OR on 07/30/2016; took 2-3 to hold me down"  . Coronary artery disease    a. prior MI/stent around 2000. b. Nuc 09/2015 -> abnl, mgmd medically.  . Facial neuralgia   . History of blood transfusion 07/30/2016   "after the operation"  . Hyperlipidemia   . Hypertension   . Ischemic cardiomyopathy   . Melanoma of nose ([place])    "inside  on the right side"  . Myocardial infarction ([place]) ~ 2000  . Pneumonia 1935- 1970s ,"several times"  . [place] spotted fever 1942  . Skin cancer    "on my back; arms; froze them off"  . Type 2 diabetes, diet controlled ([place])     Tobacco Use: Social History   Tobacco Use  Smoking Status Former Smoker  . Packs/day: 1.00  . Years: 20.00  . Pack years: 20.00  . Types: Cigarettes  . Start date: 12/30/1943  Smokeless Tobacco Never Used  Tobacco Comment   "stopped smoking in 1970s when I got pneumonia"    Labs: Recent Review Flowsheet Data    Labs for ITP Cardiac and Pulmonary Rehab Latest Ref Rng & Units 06/23/2013 07/21/2016   Hemoglobin A1c 4.8 - 5.6 % - 6.1(H)   PHART 7.350 - 7.450 7.403 -   PCO2ART 35.0 - 45.0 mmHg 35.4 -   HCO3 20.0 - 24.0 mEq/L 21.6 -   TCO2 0 - 100 mmol/L 19.0 -   ACIDBASEDEF 0.0 - 2.0 mmol/L 2.4(H) -   O2SAT % 97.0 -      Capillary Blood Glucose: Lab Results  Component Value Date   GLUCAP 97 07/15/2018   GLUCAP 109 (H) 07/15/2018   GLUCAP 129 (H) 07/29/2016   GLUCAP 114 (H) 07/21/2016   GLUCAP 108 (H)  09/28/2015     Exercise Target Goals: Exercise Program Goal: Individual exercise prescription set using results from initial 6 min walk test and THRR while considering  patient's activity barriers and safety.   Exercise Prescription Goal: Starting with aerobic activity 30 plus minutes a day, 3 days per week for initial exercise prescription. Provide home exercise prescription and guidelines that participant acknowledges understanding prior to discharge.  Activity Barriers & Risk Stratification: Activity Barriers & Cardiac Risk Stratification - 09/15/18 1411      Activity Barriers & Cardiac Risk Stratification   Activity Barriers  Muscular Weakness;Shortness of Breath    Cardiac Risk Stratification  High       6 Minute Walk: 6 Minute Walk    Row Name 09/15/18 1409         6 Minute Walk   Phase  Initial     Distance  850 feet      Walk Time  6 minutes     # of Rest Breaks  0     MPH  1.61     METS  2.23     RPE  11     Perceived Dyspnea   9     VO2 Peak  4.93     Symptoms  No     Resting HR  75 bpm     Resting BP  92/50     Resting Oxygen Saturation   97 %     Exercise Oxygen Saturation  during 6 min walk  84 %     Max Ex. HR  112 bpm     Max Ex. BP  122/52     2 Minute Post BP  100/60        Oxygen Initial Assessment:   Oxygen Re-Evaluation:   Oxygen Discharge (Final Oxygen Re-Evaluation):   Initial Exercise Prescription: Initial Exercise Prescription - 09/15/18 1400      Date of Initial Exercise RX and Referring Provider   Date  09/15/18    Referring Provider  Koneswaran      NuStep   Level  1    SPM  47    Minutes  17    METs  1.8      Arm Ergometer   Level  1    Watts  14    RPM  61    Minutes  17    METs  2      Prescription Details   Frequency (times per week)  3    Duration  Progress to 30 minutes of continuous aerobic without signs/symptoms of physical distress      Intensity   THRR 40-80% of Max Heartrate  [phone]    Ratings of Perceived Exertion  11-13    Perceived Dyspnea  0-4      Progression   Progression  Continue to progress workloads to maintain intensity without signs/symptoms of physical distress.      Resistance Training   Training Prescription  Yes    Weight  1    Reps  10-15       Perform Capillary Blood Glucose checks as needed.  Exercise Prescription Changes:  Exercise Prescription Changes    Row Name 09/15/18 1400             Home Exercise Plan   Plans to continue exercise at  Home (comment) walking       Frequency  Add 2 additional days to program exercise sessions.       Initial  Home Exercises Provided  09/15/18          Exercise Comments:   Exercise Goals and Review:  Exercise Goals    Row Name 09/15/18 1412             Exercise Goals   Increase Physical Activity  Yes       Intervention  Provide advice, education,  support and counseling about physical activity/exercise needs.;Develop an individualized exercise prescription for aerobic and resistive training based on initial evaluation findings, risk stratification, comorbidities and participant's personal goals.       Expected Outcomes  Short Term: Attend rehab on a regular basis to increase amount of physical activity.;Long Term: Add in home exercise to make exercise part of routine and to increase amount of physical activity.;Long Term: Exercising regularly at least 3-5 days a week.       Increase Strength and Stamina  Yes       Intervention  Provide advice, education, support and counseling about physical activity/exercise needs.;Develop an individualized exercise prescription for aerobic and resistive training based on initial evaluation findings, risk stratification, comorbidities and participant's personal goals.       Expected Outcomes  Short Term: Increase workloads from initial exercise prescription for resistance, speed, and METs.;Short Term: Perform resistance training exercises routinely during rehab and add in resistance training at home;Long Term: Improve cardiorespiratory fitness, muscular endurance and strength as measured by increased METs and functional capacity (6MWT)       Able to understand and use rate of perceived exertion (RPE) scale  Yes       Intervention  Provide education and explanation on how to use RPE scale       Expected Outcomes  Short Term: Able to use RPE daily in rehab to express subjective intensity level;Long Term:  Able to use RPE to guide intensity level when exercising independently       Able to understand and use Dyspnea scale  Yes       Intervention  Provide education and explanation on how to use Dyspnea scale       Expected Outcomes  Short Term: Able to use Dyspnea scale daily in rehab to express subjective sense of shortness of breath during exertion;Long Term: Able to use Dyspnea scale to guide intensity level when  exercising independently       Knowledge and understanding of Target Heart Rate Range (THRR)  Yes       Intervention  Provide education and explanation of THRR including how the numbers were predicted and where they are located for reference       Expected Outcomes  Short Term: Able to state/look up THRR;Long Term: Able to use THRR to govern intensity when exercising independently;Short Term: Able to use daily as guideline for intensity in rehab       Able to check pulse independently  Yes       Intervention  Provide education and demonstration on how to check pulse in carotid and radial arteries.;Review the importance of being able to check your own pulse for safety during independent exercise       Expected Outcomes  Short Term: Able to explain why pulse checking is important during independent exercise;Long Term: Able to check pulse independently and accurately       Understanding of Exercise Prescription  Yes       Intervention  Provide education, explanation, and written materials on patient's individual exercise prescription       Expected Outcomes  Short  Term: Able to explain program exercise prescription;Long Term: Able to explain home exercise prescription to exercise independently          Exercise Goals Re-Evaluation :    Discharge Exercise Prescription (Final Exercise Prescription Changes): Exercise Prescription Changes - 09/15/18 1400      Home Exercise Plan   Plans to continue exercise at  Home (comment)   walking   Frequency  Add 2 additional days to program exercise sessions.    Initial Home Exercises Provided  09/15/18       Nutrition:  Target Goals: Understanding of nutrition guidelines, daily intake of sodium 1500mg , cholesterol 200mg , calories 30% from fat and 7% or less from saturated fats, daily to have 5 or more servings of fruits and vegetables.  Biometrics: Pre Biometrics - 09/15/18 1413      Pre Biometrics   Height  6' (1.829 m)    Weight  95.2 kg     Waist Circumference  35.5 inches    Hip Circumference  39.5 inches    Waist to Hip Ratio  0.9 %    BMI (Calculated)  28.46    Triceps Skinfold  5 mm    % Body Fat  21.4 %    Grip Strength  31.1 kg    Flexibility  0 in    Single Leg Stand  1.64 seconds        Nutrition Therapy Plan and Nutrition Goals: Nutrition Therapy & Goals - 09/15/18 1453      Personal Nutrition Goals   Comments  Patient scored a 24 on his nutrition assessment. He and his wife plan to attend RD appointment in October.        Nutrition Assessments: Nutrition Assessments - 09/15/18 1454      MEDFICTS Scores   Pre Score  24       Nutrition Goals Re-Evaluation:   Nutrition Goals Discharge (Final Nutrition Goals Re-Evaluation):   Psychosocial: Target Goals: Acknowledge presence or absence of significant depression and/or stress, maximize coping skills, provide positive support system. Participant is able to verbalize types and ability to use techniques and skills needed for reducing stress and depression.  Initial Review & Psychosocial Screening: Initial Psych Review & Screening - 09/15/18 1450      Initial Review   Current issues with  None Identified      Family Dynamics   Good Support System?  Yes    Comments  Patient has good family support.       Barriers   Psychosocial barriers to participate in program  There are no identifiable barriers or psychosocial needs.;The patient should benefit from training in stress management and relaxation.      Screening Interventions   Interventions  Encouraged to exercise;Provide feedback about the scores to participant    Expected Outcomes  Long Term goal: The participant improves quality of Life and PHQ9 Scores as seen by post scores and/or verbalization of changes       Quality of Life Scores: Quality of Life - 09/15/18 1414      Quality of Life   Select  Quality of Life      Quality of Life Scores   Health/Function Pre  19.96 %     Socioeconomic Pre  24 %    Psych/Spiritual Pre  24.64 %    Family Pre  26.4 %    GLOBAL Pre  22.79 %      Scores of 19 and below usually indicate a poorer quality of  life in these areas.  A difference of  2-3 points is a clinically meaningful difference.  A difference of 2-3 points in the total score of the Quality of Life Index has been associated with significant improvement in overall quality of life, self-image, physical symptoms, and general health in studies assessing change in quality of life.  PHQ-9: Recent Review Flowsheet Data    Depression screen [facility] 2/9 09/15/2018   Decreased Interest 0   Down, Depressed, Hopeless 0   PHQ - 2 Score 0   Altered sleeping 0   Tired, decreased energy 2   Change in appetite 0   Feeling bad or failure about yourself  0   Trouble concentrating 0   Moving slowly or fidgety/restless 1   Suicidal thoughts 0   PHQ-9 Score 3   Difficult doing work/chores Not difficult at all     Interpretation of Total Score  Total Score Depression Severity:  1-4 = Minimal depression, 5-9 = Mild depression, 10-14 = Moderate depression, 15-19 = Moderately severe depression, 20-27 = Severe depression   Psychosocial Evaluation and Intervention: Psychosocial Evaluation - 09/15/18 1451      Psychosocial Evaluation & Interventions   Interventions  Encouraged to exercise with the program and follow exercise prescription;Stress management education;Relaxation education    Comments  Patient's initial QOL score was 22.79 and his PHQ-9 score was 3 with no psychosocial issues identified at orientation.    Expected Outcomes  Patient will have no psychosocial issues identified at discharge.     Continue Psychosocial Services   No Follow up required       Psychosocial Re-Evaluation:   Psychosocial Discharge (Final Psychosocial Re-Evaluation):   Vocational Rehabilitation: Provide vocational rehab assistance to qualifying candidates.   Vocational Rehab Evaluation &  Intervention: Vocational Rehab - 09/15/18 1455      Initial Vocational Rehab Evaluation & Intervention   Assessment shows need for Vocational Rehabilitation  No       Education: Education Goals: Education classes will be provided on a weekly basis, covering required topics. Participant will state understanding/return demonstration of topics presented.  Learning Barriers/Preferences: Learning Barriers/Preferences - 09/15/18 1454      Learning Barriers/Preferences   Learning Barriers  Hearing    Learning Preferences  Written Material       Education Topics: Hypertension, Hypertension Reduction -Define heart disease and high blood pressure. Discus how high blood pressure affects the body and ways to reduce high blood pressure.   Exercise and Your Heart -Discuss why it is important to exercise, the FITT principles of exercise, normal and abnormal responses to exercise, and how to exercise safely.   Angina -Discuss definition of angina, causes of angina, treatment of angina, and how to decrease risk of having angina.   Cardiac Medications -Review what the following cardiac medications are used for, how they affect the body, and side effects that may occur when taking the medications.  Medications include Aspirin, Beta blockers, calcium channel blockers, ACE Inhibitors, angiotensin receptor blockers, diuretics, digoxin, and antihyperlipidemics.   Congestive Heart Failure -Discuss the definition of CHF, how to live with CHF, the signs and symptoms of CHF, and how keep track of weight and sodium intake.   Heart Disease and Intimacy -Discus the effect sexual activity has on the heart, how changes occur during intimacy as we age, and safety during sexual activity.   Smoking Cessation / COPD -Discuss different methods to quit smoking, the health benefits of quitting smoking, and the definition of COPD.  Nutrition I: Fats -Discuss the types of cholesterol, what cholesterol does  to the heart, and how cholesterol levels can be controlled.   Nutrition II: Labels -Discuss the different components of food labels and how to read food label   Heart Parts/Heart Disease and PAD -Discuss the anatomy of the heart, the pathway of blood circulation through the heart, and these are affected by heart disease.   Stress I: Signs and Symptoms -Discuss the causes of stress, how stress may lead to anxiety and depression, and ways to limit stress.   Stress II: Relaxation -Discuss different types of relaxation techniques to limit stress.   Warning Signs of Stroke / TIA -Discuss definition of a stroke, what the signs and symptoms are of a stroke, and how to identify when someone is having stroke.   Knowledge Questionnaire Score: Knowledge Questionnaire Score - 09/15/18 1455      Knowledge Questionnaire Score   Pre Score  21/24       Core Components/Risk Factors/Patient Goals at Admission: Personal Goals and Risk Factors at Admission - 09/15/18 1455      Core Components/Risk Factors/Patient Goals on Admission    Weight Management  Weight Maintenance    Improve shortness of breath with ADL's  Yes    Expected Outcomes  Short Term: Improve cardiorespiratory fitness to achieve a reduction of symptoms when performing ADLs;Long Term: Be able to perform more ADLs without symptoms or delay the onset of symptoms    Personal Goal Other  Yes    Personal Goal  Get well; gain strength in legs; stay well; improve SOB with activities.     Intervention  Patient will attend CR 3 days/week and supplement with exercise 2 days/week.     Expected Outcomes  Patient will meet his personal goals.        Core Components/Risk Factors/Patient Goals Review:    Core Components/Risk Factors/Patient Goals at Discharge (Final Review):    ITP Comments: ITP Comments    Row Name 09/15/18 1435           ITP Comments  Patient is an 84 year old male referred by Dr. Irish Lack for s/p stent  placement on 07/15/2018. He is HOH and is a fall risk. He has no barriers to CR.           Comments: Patient arrived for 1st visit/orientation/education at 1230. Patient was referred to CR by Dr. Irish Lack due to S/P Stent Placement (Z95.5). During orientation advised patient on arrival and appointment times what to wear, what to do before, during and after exercise. Reviewed attendance and class policy. Talked about inclement weather and class consultation policy. Pt is scheduled to return Cardiac Rehab on 09/20/2018 at 1100. Pt was advised to come to class 15 minutes before class starts. Patient was also given instructions on meeting with the dietician and attending the Family Structure classes. Discussed RPE/Dpysnea scales. Discussed initial THR and how to find their radial and/or carotid pulse. Discussed the initial exercise prescription and how this effects their progress. Pt is eager to get started. Patient participated in warm up stretches followed by light weights and resistance bands. Patient was able to complete 6 minute walk test. His O2 saturation dropped to 84% during walk test. Patient was measured for the equipment. Discussed equipment safety with patient. Took patient pre-anthropometric measurements. Patient finished visit at 1430.

## 2018-09-15 NOTE — Progress Notes (Signed)
Cardiac/Pulmonary Rehab Medication Review by a Pharmacist  Does the patient  feel that his/her medications are working for him/her?  yes  Has the patient been experiencing any side effects to the medications prescribed?  no  Does the patient measure his/her own blood pressure or blood glucose at home?  Yes, at times.   Does the patient have any problems obtaining medications due to transportation or finances?   no  Understanding of regimen: good Understanding of indications: good Potential of compliance: good  Questions asked to Determine Patient Understanding of Medication Regimen:  1. What is the name of the medication?  2. What is the medication used for?  3. When should it be taken?  4. How much should be taken?  5. How will you take it?  6. What side effects should you report?  Understanding Defined as: Excellent: All questions above are correct Good: Questions 1-4 are correct Fair: Questions 1-2 are correct  Poor: 1 or none of the above questions are correct   Pharmacist comments: Overall, patient has a good understanding of his medications and his wife helps him with them.  They have no further questions and his medication list is updated    Jimmy Knox 09/15/2018 2:21 PM

## 2018-09-20 ENCOUNTER — Encounter (HOSPITAL_COMMUNITY)
Admission: RE | Admit: 2018-09-20 | Discharge: 2018-09-20 | Disposition: A | Discharge status: Home / Self Care | Payer: Medicare Other | Source: Ambulatory Visit | Attending: Cardiovascular Disease | Admitting: Cardiovascular Disease

## 2018-09-20 DIAGNOSIS — Z955 Presence of coronary angioplasty implant and graft: Secondary | ICD-10-CM | POA: Diagnosis not present

## 2018-09-20 NOTE — Progress Notes (Signed)
Daily Session Note  Patient Details  Name: Jimmy Knox MRN: [id number] Date of Birth: [birth year] Referring Provider:     [place] from 09/15/2018 in [place]  Referring Provider  [name]      Encounter Date: 09/15/2018  Check In:   Capillary Blood Glucose: No results found for this or any previous visit (from the past 24 hour(s)).    Social History   Tobacco Use  Smoking Status Former Smoker  . Packs/day: 1.00  . Years: 20.00  . Pack years: 20.00  . Types: Cigarettes  . Start date: 12/30/1943  Smokeless Tobacco Never Used  Tobacco Comment   "stopped smoking in 1970s when I got pneumonia"    Goals Met:  Exercise tolerated well Strength training completed today  Goals Unmet:  Not Applicable  Comments: Check out 1500.   Dr. Kate Sable is Medical Director for [facility] Cardiac and Pulmonary Rehab.

## 2018-09-20 NOTE — Progress Notes (Signed)
Daily Session Note  Patient Details  Name: ELDWIN VOLKOV MRN: [id number] Date of Birth: 1932/10/07 Referring Provider:     [place] from 09/15/2018 in [place]  Referring Provider  [name]      Encounter Date: 09/20/2018  Check In:   Capillary Blood Glucose: No results found for this or any previous visit (from the past 24 hour(s)).    Social History   Tobacco Use  Smoking Status Former Smoker  . Packs/day: 1.00  . Years: 20.00  . Pack years: 20.00  . Types: Cigarettes  . Start date: 12/30/1943  Smokeless Tobacco Never Used  Tobacco Comment   "stopped smoking in 1970s when I got pneumonia"    Goals Met:  Independence with exercise equipment Exercise tolerated well No report of cardiac concerns or symptoms Strength training completed today  Goals Unmet:  Not Applicable  Comments: Pt able to follow exercise prescription today without complaint.  Will continue to monitor for progression. Check out 1200.   Dr. Kate Sable is Medical Director for [facility] Cardiac and Pulmonary Rehab.

## 2018-09-22 ENCOUNTER — Encounter (HOSPITAL_COMMUNITY)
Admission: RE | Admit: 2018-09-22 | Discharge: 2018-09-22 | Disposition: A | Discharge status: Home / Self Care | Payer: Medicare Other | Source: Ambulatory Visit | Attending: Cardiovascular Disease | Admitting: Cardiovascular Disease

## 2018-09-22 DIAGNOSIS — Z955 Presence of coronary angioplasty implant and graft: Secondary | ICD-10-CM

## 2018-09-22 NOTE — Progress Notes (Signed)
Daily Session Note  Patient Details  Name: Jimmy Knox MRN: [id number] Date of Birth: 04-01-1932 Referring Provider:     [place] from 09/15/2018 in [place]  Referring Provider  [name]      Encounter Date: 09/22/2018  Check In: Session Check In - 09/22/18 1100      Check-In   Supervising physician immediately available to respond to emergencies  See telemetry face sheet for immediately available MD    Location  [facility]    Staff Present  Benay Pike, Exercise Physiologist;Debra Wynetta Emery, RN, BSN    Medication changes reported      No    Fall or balance concerns reported     Yes    Comments  Patient has h/o 3 falls in past 12 months. He exhibits an unsteady gait and says he gets dizzy at times.     Warm-up and Cool-down  Performed as group-led Higher education careers adviser Performed  Yes    VAD Patient?  No    PAD/SET Patient?  No      Pain Assessment   Currently in Pain?  No/denies    Pain Score  0-No pain    Multiple Pain Sites  No       Capillary Blood Glucose: No results found for this or any previous visit (from the past 24 hour(s)).    Social History   Tobacco Use  Smoking Status Former Smoker  . Packs/day: 1.00  . Years: 20.00  . Pack years: 20.00  . Types: Cigarettes  . Start date: 12/30/1943  Smokeless Tobacco Never Used  Tobacco Comment   "stopped smoking in 1970s when I got pneumonia"    Goals Met:  Independence with exercise equipment Exercise tolerated well No report of cardiac concerns or symptoms Strength training completed today  Goals Unmet:  Not Applicable  Comments: Pt able to follow exercise prescription today without complaint.  Will continue to monitor for progression. Check out 12:00.   Dr. Kate Sable is Medical Director for [facility] Cardiac and Pulmonary Rehab.

## 2018-09-24 ENCOUNTER — Encounter (HOSPITAL_COMMUNITY)
Admission: RE | Admit: 2018-09-24 | Discharge: 2018-09-24 | Disposition: A | Discharge status: Home / Self Care | Payer: Medicare Other | Source: Ambulatory Visit | Attending: Cardiovascular Disease | Admitting: Cardiovascular Disease

## 2018-09-24 DIAGNOSIS — Z955 Presence of coronary angioplasty implant and graft: Secondary | ICD-10-CM | POA: Diagnosis not present

## 2018-09-24 NOTE — Progress Notes (Signed)
Daily Session Note  Patient Details  Name: Jimmy Knox MRN: [id number] Date of Birth: 21-Apr-1932 Referring Provider:     [place] from 09/15/2018 in [place]  Referring Provider  [name]      Encounter Date: 09/24/2018  Check In: Session Check In - 09/24/18 1100      Check-In   Supervising physician immediately available to respond to emergencies  See telemetry face sheet for immediately available MD    Location  [facility]    Staff Present  Benay Pike, Exercise Physiologist;Sarye Kath Wynetta Emery, RN, BSN;Diane Coad, MS, EP, [facility], Exercise Physiologist    Medication changes reported      No    Fall or balance concerns reported     Yes    Comments  Patient has h/o 3 falls in past 12 months. He exhibits an unsteady gait and says he gets dizzy at times.     Warm-up and Cool-down  Performed as group-led Higher education careers adviser Performed  Yes    VAD Patient?  No    PAD/SET Patient?  No      Pain Assessment   Currently in Pain?  No/denies    Pain Score  0-No pain    Multiple Pain Sites  No       Capillary Blood Glucose: No results found for this or any previous visit (from the past 24 hour(s)).    Social History   Tobacco Use  Smoking Status Former Smoker  . Packs/day: 1.00  . Years: 20.00  . Pack years: 20.00  . Types: Cigarettes  . Start date: 12/30/1943  Smokeless Tobacco Never Used  Tobacco Comment   "stopped smoking in 1970s when I got pneumonia"    Goals Met:  Independence with exercise equipment Exercise tolerated well No report of cardiac concerns or symptoms Strength training completed today  Goals Unmet:  Not Applicable  Comments: Pt able to follow exercise prescription today without complaint.  Will continue to monitor for progression. Check out 1200.     Dr. Kate Sable is Medical Director for [facility] Cardiac and Pulmonary Rehab.

## 2018-09-27 ENCOUNTER — Encounter (HOSPITAL_COMMUNITY)
Admission: RE | Admit: 2018-09-27 | Discharge: 2018-09-27 | Disposition: A | Discharge status: Home / Self Care | Payer: Medicare Other | Source: Ambulatory Visit | Attending: Cardiovascular Disease | Admitting: Cardiovascular Disease

## 2018-09-27 DIAGNOSIS — Z955 Presence of coronary angioplasty implant and graft: Secondary | ICD-10-CM

## 2018-09-27 NOTE — Progress Notes (Signed)
Daily Session Note  Patient Details  Name: Jimmy Knox MRN: [id number] Date of Birth: 02/08/1932 Referring Provider:     [place] from 09/15/2018 in [place]  Referring Provider  [name]      Encounter Date: 09/27/2018  Check In: Session Check In - 09/27/18 1100      Check-In   Supervising physician immediately available to respond to emergencies  See telemetry face sheet for immediately available MD    Location  [facility]    Staff Present  Benay Pike, Exercise Physiologist;Debra Wynetta Emery, RN, BSN    Medication changes reported      No    Fall or balance concerns reported     Yes    Comments  Patient has h/o 3 falls in past 12 months. He exhibits an unsteady gait and says he gets dizzy at times.     Warm-up and Cool-down  Performed as group-led Higher education careers adviser Performed  Yes    VAD Patient?  No    PAD/SET Patient?  No      Pain Assessment   Currently in Pain?  No/denies    Pain Score  0-No pain    Multiple Pain Sites  No       Capillary Blood Glucose: No results found for this or any previous visit (from the past 24 hour(s)).    Social History   Tobacco Use  Smoking Status Former Smoker  . Packs/day: 1.00  . Years: 20.00  . Pack years: 20.00  . Types: Cigarettes  . Start date: 12/30/1943  Smokeless Tobacco Never Used  Tobacco Comment   "stopped smoking in 1970s when I got pneumonia"    Goals Met:  Independence with exercise equipment Exercise tolerated well No report of cardiac concerns or symptoms Strength training completed today  Goals Unmet:  Not Applicable  Comments: Pt able to follow exercise prescription today without complaint.  Will continue to monitor for progression. Check out 12:00.   Dr. Kate Sable is Medical Director for [facility] Cardiac and Pulmonary Rehab.

## 2018-09-29 ENCOUNTER — Encounter (HOSPITAL_COMMUNITY)
Admission: RE | Admit: 2018-09-29 | Discharge: 2018-09-29 | Disposition: A | Discharge status: Home / Self Care | Payer: Medicare Other | Source: Ambulatory Visit | Attending: Cardiovascular Disease | Admitting: Cardiovascular Disease

## 2018-09-29 DIAGNOSIS — Z955 Presence of coronary angioplasty implant and graft: Secondary | ICD-10-CM | POA: Insufficient documentation

## 2018-09-29 NOTE — Progress Notes (Signed)
Daily Session Note  Patient Details  Name: ISACK LAVALLEY MRN: [id number] Date of Birth: 02-Mar-1932 Referring Provider:     [place] from 09/15/2018 in [place]  Referring Provider  [name]      Encounter Date: 09/29/2018  Check In: Session Check In - 09/29/18 1100      Check-In   Supervising physician immediately available to respond to emergencies  See telemetry face sheet for immediately available MD    Location  [facility]    Staff Present  Benay Pike, Exercise Physiologist;Letti Towell Wynetta Emery, RN, BSN    Medication changes reported      No    Fall or balance concerns reported     Yes    Comments  Patient has h/o 3 falls in past 12 months. He exhibits an unsteady gait and says he gets dizzy at times.     Warm-up and Cool-down  Performed as group-led Higher education careers adviser Performed  Yes    VAD Patient?  No    PAD/SET Patient?  No      Pain Assessment   Currently in Pain?  No/denies    Pain Score  0-No pain    Multiple Pain Sites  No       Capillary Blood Glucose: No results found for this or any previous visit (from the past 24 hour(s)).    Social History   Tobacco Use  Smoking Status Former Smoker  . Packs/day: 1.00  . Years: 20.00  . Pack years: 20.00  . Types: Cigarettes  . Start date: 12/30/1943  Smokeless Tobacco Never Used  Tobacco Comment   "stopped smoking in 1970s when I got pneumonia"    Goals Met:  Exercise tolerated well No report of cardiac concerns or symptoms Strength training completed today  Goals Unmet:  Not Applicable  Comments: Pt able to follow exercise prescription today without complaint.  Will continue to monitor for progression. Check out 1200.   Dr. Kate Sable is Medical Director for [facility] Cardiac and Pulmonary Rehab.

## 2018-10-01 ENCOUNTER — Encounter (HOSPITAL_COMMUNITY)
Admission: RE | Admit: 2018-10-01 | Discharge: 2018-10-01 | Disposition: A | Discharge status: Home / Self Care | Payer: Medicare Other | Source: Ambulatory Visit | Attending: Cardiovascular Disease | Admitting: Cardiovascular Disease

## 2018-10-01 DIAGNOSIS — Z955 Presence of coronary angioplasty implant and graft: Secondary | ICD-10-CM

## 2018-10-01 DIAGNOSIS — Z23 Encounter for immunization: Secondary | ICD-10-CM | POA: Diagnosis not present

## 2018-10-01 NOTE — Progress Notes (Signed)
Daily Session Note  Patient Details  Name: DOLORES MCGOVERN MRN: [id number] Date of Birth: 1932/10/29 Referring Provider:     [place] from 09/15/2018 in [place]  Referring Provider  [name]      Encounter Date: 10/01/2018  Check In:   Capillary Blood Glucose: No results found for this or any previous visit (from the past 24 hour(s)).    Social History   Tobacco Use  Smoking Status Former Smoker  . Packs/day: 1.00  . Years: 20.00  . Pack years: 20.00  . Types: Cigarettes  . Start date: 12/30/1943  Smokeless Tobacco Never Used  Tobacco Comment   "stopped smoking in 1970s when I got pneumonia"    Goals Met:  Independence with exercise equipment Exercise tolerated well No report of cardiac concerns or symptoms Strength training completed today  Goals Unmet:  Not Applicable  Comments: Pt able to follow exercise prescription today without complaint.  Will continue to monitor for progression. Check out 12:00.   Dr. Kate Sable is Medical Director for [facility] Cardiac and Pulmonary Rehab.

## 2018-10-04 ENCOUNTER — Encounter (HOSPITAL_COMMUNITY)
Admission: RE | Admit: 2018-10-04 | Discharge: 2018-10-04 | Disposition: A | Discharge status: Home / Self Care | Payer: Medicare Other | Source: Ambulatory Visit | Attending: Cardiovascular Disease | Admitting: Cardiovascular Disease

## 2018-10-04 DIAGNOSIS — Z955 Presence of coronary angioplasty implant and graft: Secondary | ICD-10-CM

## 2018-10-04 NOTE — Progress Notes (Signed)
Daily Session Note  Patient Details  Name: Jimmy Knox MRN: [id number] Date of Birth: [birth year] Referring Provider:     [place] from 09/15/2018 in [place]  Referring Provider  [name]      Encounter Date: 10/04/2018  Check In: Session Check In - 10/04/18 1100      Check-In   Supervising physician immediately available to respond to emergencies  See telemetry face sheet for immediately available MD    Location  [facility]    Staff Present  Benay Pike, Exercise Physiologist;Debra Wynetta Emery, RN, BSN    Medication changes reported      No    Fall or balance concerns reported     Yes    Comments  Patient has h/o 3 falls in past 12 months. He exhibits an unsteady gait and says he gets dizzy at times.     Warm-up and Cool-down  Performed as group-led Higher education careers adviser Performed  Yes    VAD Patient?  No    PAD/SET Patient?  No      Pain Assessment   Currently in Pain?  No/denies    Pain Score  0-No pain    Multiple Pain Sites  No       Capillary Blood Glucose: No results found for this or any previous visit (from the past 24 hour(s)).    Social History   Tobacco Use  Smoking Status Former Smoker  . Packs/day: 1.00  . Years: 20.00  . Pack years: 20.00  . Types: Cigarettes  . Start date: 12/30/1943  Smokeless Tobacco Never Used  Tobacco Comment   "stopped smoking in 1970s when I got pneumonia"    Goals Met:  Independence with exercise equipment Exercise tolerated well No report of cardiac concerns or symptoms Strength training completed today  Goals Unmet:  Not Applicable  Comments: Pt able to follow exercise prescription today without complaint.  Will continue to monitor for progression. Check out 1200.   Dr. Kate Sable is Medical Director for [facility] Cardiac and Pulmonary Rehab.

## 2018-10-06 ENCOUNTER — Encounter (HOSPITAL_COMMUNITY)
Admission: RE | Admit: 2018-10-06 | Discharge: 2018-10-06 | Disposition: A | Discharge status: Home / Self Care | Payer: Medicare Other | Source: Ambulatory Visit | Attending: Cardiovascular Disease | Admitting: Cardiovascular Disease

## 2018-10-06 DIAGNOSIS — Z955 Presence of coronary angioplasty implant and graft: Secondary | ICD-10-CM | POA: Diagnosis not present

## 2018-10-06 NOTE — Progress Notes (Signed)
Daily Session Note  Patient Details  Name: Jimmy Knox MRN: [id number] Date of Birth: 1932-12-02 Referring Provider:     [place] from 09/15/2018 in [place]  Referring Provider  [name]      Encounter Date: 10/06/2018  Check In: Session Check In - 10/06/18 1100      Check-In   Supervising physician immediately available to respond to emergencies  See telemetry face sheet for immediately available MD    Location  [facility]    Staff Present  Benay Pike, Exercise Physiologist;Andrue Dini Wynetta Emery, RN, BSN    Medication changes reported      No    Fall or balance concerns reported     Yes    Comments  Patient has h/o 3 falls in past 12 months. He exhibits an unsteady gait and says he gets dizzy at times.     Warm-up and Cool-down  Performed as group-led Higher education careers adviser Performed  Yes    VAD Patient?  No    PAD/SET Patient?  No      Pain Assessment   Currently in Pain?  No/denies    Pain Score  0-No pain    Multiple Pain Sites  No       Capillary Blood Glucose: No results found for this or any previous visit (from the past 24 hour(s)).    Social History   Tobacco Use  Smoking Status Former Smoker  . Packs/day: 1.00  . Years: 20.00  . Pack years: 20.00  . Types: Cigarettes  . Start date: 12/30/1943  Smokeless Tobacco Never Used  Tobacco Comment   "stopped smoking in 1970s when I got pneumonia"    Goals Met:  Independence with exercise equipment Exercise tolerated well No report of cardiac concerns or symptoms Strength training completed today  Goals Unmet:  Not Applicable  Comments: Pt able to follow exercise prescription today without complaint.  Will continue to monitor for progression. Check out 1200.   Dr. Kate Sable is Medical Director for [facility] Cardiac and Pulmonary Rehab.

## 2018-10-07 NOTE — Progress Notes (Signed)
Cardiac Individual Treatment Plan  Patient Details  Name: Jimmy Knox MRN: [id number] Date of Birth: 01/19/1932 Referring Provider:     [place] from 09/15/2018 in [place]  Referring Provider  [name]      Initial Encounter Date:    CARDIAC REHAB PHASE II ORIENTATION from 09/15/2018 in [place]  Date  09/15/18      Visit Diagnosis: Status post coronary artery stent placement  Patient's Home Medications on Admission:  Current Outpatient Medications:  .  amitriptyline (ELAVIL) 50 MG tablet, Take 50 mg by mouth at bedtime., Disp: , Rfl:  .  aspirin EC 81 MG tablet, Take 81 mg by mouth daily., Disp: , Rfl:  .  clopidogrel (PLAVIX) 75 MG tablet, Take 1 tablet (75 mg total) by mouth daily., Disp: 90 tablet, Rfl: 3 .  losartan (COZAAR) 25 MG tablet, TAKE 1 TABLET BY MOUTH ONCE DAILY., Disp: 90 tablet, Rfl: 3 .  metoprolol succinate (TOPROL-XL) 25 MG 24 hr tablet, Take 25 mg by mouth daily., Disp: , Rfl:  .  nitroGLYCERIN (NITROSTAT) 0.4 MG SL tablet, Place 1 tablet (0.4 mg total) under the tongue every 5 (five) minutes as needed for chest pain., Disp: 25 tablet, Rfl: 2 .  simvastatin (ZOCOR) 40 MG tablet, Take 40 mg by mouth every evening. , Disp: , Rfl:   Past Medical History: Past Medical History:  Diagnosis Date  . Anxiety   . Arthritis    "right hip" (07/15/2018)  . Carotid artery disease ([place])    a. <50% bilaterally 05/2016.  Marland Kitchen Chronic combined systolic and diastolic CHF (congestive heart failure) ([place])   . Complication of anesthesia    "didn't wake up well after OR on 07/30/2016; took 2-3 to hold me down"  . Coronary artery disease    a. prior MI/stent around 2000. b. Nuc 09/2015 -> abnl, mgmd medically.  . Facial neuralgia   . History of blood transfusion 07/30/2016   "after the operation"  . Hyperlipidemia   . Hypertension   . Ischemic cardiomyopathy   . Melanoma of nose ([place])    "inside  on the right side"  . Myocardial infarction ([place]) ~ 2000  . Pneumonia 1935- 1970s ,"several times"  . [place] spotted fever 1942  . Skin cancer    "on my back; arms; froze them off"  . Type 2 diabetes, diet controlled ([place])     Tobacco Use: Social History   Tobacco Use  Smoking Status Former Smoker  . Packs/day: 1.00  . Years: 20.00  . Pack years: 20.00  . Types: Cigarettes  . Start date: 12/30/1943  Smokeless Tobacco Never Used  Tobacco Comment   "stopped smoking in 1970s when I got pneumonia"    Labs: Recent Review Flowsheet Data    Labs for ITP Cardiac and Pulmonary Rehab Latest Ref Rng & Units 06/23/2013 07/21/2016   Hemoglobin A1c 4.8 - 5.6 % - 6.1(H)   PHART 7.350 - 7.450 7.403 -   PCO2ART 35.0 - 45.0 mmHg 35.4 -   HCO3 20.0 - 24.0 mEq/L 21.6 -   TCO2 0 - 100 mmol/L 19.0 -   ACIDBASEDEF 0.0 - 2.0 mmol/L 2.4(H) -   O2SAT % 97.0 -      Capillary Blood Glucose: Lab Results  Component Value Date   GLUCAP 97 07/15/2018   GLUCAP 109 (H) 07/15/2018   GLUCAP 129 (H) 07/29/2016   GLUCAP 114 (H) 07/21/2016   GLUCAP 108 (H)  09/28/2015     Exercise Target Goals: Exercise Program Goal: Individual exercise prescription set using results from initial 6 min walk test and THRR while considering  patient's activity barriers and safety.   Exercise Prescription Goal: Starting with aerobic activity 30 plus minutes a day, 3 days per week for initial exercise prescription. Provide home exercise prescription and guidelines that participant acknowledges understanding prior to discharge.  Activity Barriers & Risk Stratification: Activity Barriers & Cardiac Risk Stratification - 09/15/18 1411      Activity Barriers & Cardiac Risk Stratification   Activity Barriers  Muscular Weakness;Shortness of Breath    Cardiac Risk Stratification  High       6 Minute Walk: 6 Minute Walk    Row Name 09/15/18 1409         6 Minute Walk   Phase  Initial     Distance  850 feet      Walk Time  6 minutes     # of Rest Breaks  0     MPH  1.61     METS  2.23     RPE  11     Perceived Dyspnea   9     VO2 Peak  4.93     Symptoms  No     Resting HR  75 bpm     Resting BP  92/50     Resting Oxygen Saturation   97 %     Exercise Oxygen Saturation  during 6 min walk  84 %     Max Ex. HR  112 bpm     Max Ex. BP  122/52     2 Minute Post BP  100/60        Oxygen Initial Assessment:   Oxygen Re-Evaluation:   Oxygen Discharge (Final Oxygen Re-Evaluation):   Initial Exercise Prescription: Initial Exercise Prescription - 09/15/18 1400      Date of Initial Exercise RX and Referring Provider   Date  09/15/18    Referring Provider  Koneswaran      NuStep   Level  1    SPM  47    Minutes  17    METs  1.8      Arm Ergometer   Level  1    Watts  14    RPM  61    Minutes  17    METs  2      Prescription Details   Frequency (times per week)  3    Duration  Progress to 30 minutes of continuous aerobic without signs/symptoms of physical distress      Intensity   THRR 40-80% of Max Heartrate  [phone]    Ratings of Perceived Exertion  11-13    Perceived Dyspnea  0-4      Progression   Progression  Continue to progress workloads to maintain intensity without signs/symptoms of physical distress.      Resistance Training   Training Prescription  Yes    Weight  1    Reps  10-15       Perform Capillary Blood Glucose checks as needed.  Exercise Prescription Changes:  Exercise Prescription Changes    Row Name 09/15/18 1400 09/28/18 1200           Response to Exercise   Blood Pressure (Admit)  -  122/64      Blood Pressure (Exercise)  -  130/64      Blood Pressure (Exit)  -  118/66  Heart Rate (Admit)  -  94 bpm      Heart Rate (Exercise)  -  109 bpm      Heart Rate (Exit)  -  82 bpm      Rating of Perceived Exertion (Exercise)  -  11      Comments  -  first full week of exercise       Duration  -  Progress to 30 minutes of  aerobic  without signs/symptoms of physical distress      Intensity  -  THRR unchanged        Progression   Progression  -  Continue to progress workloads to maintain intensity without signs/symptoms of physical distress.      Average METs  -  2.3        Resistance Training   Training Prescription  -  Yes      Weight  -  1      Reps  -  10-15        NuStep   Level  -  1      SPM  -  100      Minutes  -  22      METs  -  2.3        Arm Ergometer   Level  -  1      Watts  -  17      RPM  -  63      Minutes  -  17      METs  -  2.3        Home Exercise Plan   Plans to continue exercise at  Home (comment) walking  Home (comment)      Frequency  Add 2 additional days to program exercise sessions.  Add 2 additional days to program exercise sessions.      Initial Home Exercises Provided  09/15/18  09/15/18         Exercise Comments:  Exercise Comments    Row Name 10/05/18 3299           Exercise Comments  Patient has tolerated exercise and progressions well. Will continue to monitor and progress as needed.           Exercise Goals and Review:  Exercise Goals    Row Name 09/15/18 1412             Exercise Goals   Increase Physical Activity  Yes       Intervention  Provide advice, education, support and counseling about physical activity/exercise needs.;Develop an individualized exercise prescription for aerobic and resistive training based on initial evaluation findings, risk stratification, comorbidities and participant's personal goals.       Expected Outcomes  Short Term: Attend rehab on a regular basis to increase amount of physical activity.;Long Term: Add in home exercise to make exercise part of routine and to increase amount of physical activity.;Long Term: Exercising regularly at least 3-5 days a week.       Increase Strength and Stamina  Yes       Intervention  Provide advice, education, support and counseling about physical activity/exercise needs.;Develop an  individualized exercise prescription for aerobic and resistive training based on initial evaluation findings, risk stratification, comorbidities and participant's personal goals.       Expected Outcomes  Short Term: Increase workloads from initial exercise prescription for resistance, speed, and METs.;Short Term: Perform resistance training exercises routinely during rehab and add in resistance  training at home;Long Term: Improve cardiorespiratory fitness, muscular endurance and strength as measured by increased METs and functional capacity (6MWT)       Able to understand and use rate of perceived exertion (RPE) scale  Yes       Intervention  Provide education and explanation on how to use RPE scale       Expected Outcomes  Short Term: Able to use RPE daily in rehab to express subjective intensity level;Long Term:  Able to use RPE to guide intensity level when exercising independently       Able to understand and use Dyspnea scale  Yes       Intervention  Provide education and explanation on how to use Dyspnea scale       Expected Outcomes  Short Term: Able to use Dyspnea scale daily in rehab to express subjective sense of shortness of breath during exertion;Long Term: Able to use Dyspnea scale to guide intensity level when exercising independently       Knowledge and understanding of Target Heart Rate Range (THRR)  Yes       Intervention  Provide education and explanation of THRR including how the numbers were predicted and where they are located for reference       Expected Outcomes  Short Term: Able to state/look up THRR;Long Term: Able to use THRR to govern intensity when exercising independently;Short Term: Able to use daily as guideline for intensity in rehab       Able to check pulse independently  Yes       Intervention  Provide education and demonstration on how to check pulse in carotid and radial arteries.;Review the importance of being able to check your own pulse for safety during  independent exercise       Expected Outcomes  Short Term: Able to explain why pulse checking is important during independent exercise;Long Term: Able to check pulse independently and accurately       Understanding of Exercise Prescription  Yes       Intervention  Provide education, explanation, and written materials on patient's individual exercise prescription       Expected Outcomes  Short Term: Able to explain program exercise prescription;Long Term: Able to explain home exercise prescription to exercise independently          Exercise Goals Re-Evaluation : Exercise Goals Re-Evaluation    Row Name 10/05/18 0809             Exercise Goal Re-Evaluation   Exercise Goals Review  Increase Physical Activity;Able to understand and use rate of perceived exertion (RPE) scale;Knowledge and understanding of Target Heart Rate Range (THRR);Understanding of Exercise Prescription;Increase Strength and Stamina;Able to understand and use Dyspnea scale;Able to check pulse independently       Comments  Patient has done well in the program so far. He has completed 8 sessions so far. He has tolerated exercise well and is always working hard to increase his activity level.        Expected Outcomes  Gain strength and improve shortness of breath with activity.            Discharge Exercise Prescription (Final Exercise Prescription Changes): Exercise Prescription Changes - 09/28/18 1200      Response to Exercise   Blood Pressure (Admit)  122/64    Blood Pressure (Exercise)  130/64    Blood Pressure (Exit)  118/66    Heart Rate (Admit)  94 bpm    Heart Rate (Exercise)  109  bpm    Heart Rate (Exit)  82 bpm    Rating of Perceived Exertion (Exercise)  11    Comments  first full week of exercise     Duration  Progress to 30 minutes of  aerobic without signs/symptoms of physical distress    Intensity  THRR unchanged      Progression   Progression  Continue to progress workloads to maintain intensity  without signs/symptoms of physical distress.    Average METs  2.3      Resistance Training   Training Prescription  Yes    Weight  1    Reps  10-15      NuStep   Level  1    SPM  100    Minutes  22    METs  2.3      Arm Ergometer   Level  1    Watts  17    RPM  63    Minutes  17    METs  2.3      Home Exercise Plan   Plans to continue exercise at  Home (comment)    Frequency  Add 2 additional days to program exercise sessions.    Initial Home Exercises Provided  09/15/18       Nutrition:  Target Goals: Understanding of nutrition guidelines, daily intake of sodium 1500mg , cholesterol 200mg , calories 30% from fat and 7% or less from saturated fats, daily to have 5 or more servings of fruits and vegetables.  Biometrics: Pre Biometrics - 09/15/18 1413      Pre Biometrics   Height  6' (1.829 m)    Weight  95.2 kg    Waist Circumference  35.5 inches    Hip Circumference  39.5 inches    Waist to Hip Ratio  0.9 %    BMI (Calculated)  28.46    Triceps Skinfold  5 mm    % Body Fat  21.4 %    Grip Strength  31.1 kg    Flexibility  0 in    Single Leg Stand  1.64 seconds        Nutrition Therapy Plan and Nutrition Goals: Nutrition Therapy & Goals - 10/07/18 0806      Nutrition Therapy   RD appointment deferred  Yes      Personal Nutrition Goals   Comments  Patient continues to say he is eating heart healthy. Will continue to montior for progress.        Nutrition Assessments: Nutrition Assessments - 09/15/18 1454      MEDFICTS Scores   Pre Score  24       Nutrition Goals Re-Evaluation:   Nutrition Goals Discharge (Final Nutrition Goals Re-Evaluation):   Psychosocial: Target Goals: Acknowledge presence or absence of significant depression and/or stress, maximize coping skills, provide positive support system. Participant is able to verbalize types and ability to use techniques and skills needed for reducing stress and depression.  Initial Review &  Psychosocial Screening: Initial Psych Review & Screening - 09/15/18 1450      Initial Review   Current issues with  None Identified      Family Dynamics   Good Support System?  Yes    Comments  Patient has good family support.       Barriers   Psychosocial barriers to participate in program  There are no identifiable barriers or psychosocial needs.;The patient should benefit from training in stress management and relaxation.      Screening  Interventions   Interventions  Encouraged to exercise;Provide feedback about the scores to participant    Expected Outcomes  Long Term goal: The participant improves quality of Life and PHQ9 Scores as seen by post scores and/or verbalization of changes       Quality of Life Scores: Quality of Life - 09/15/18 1414      Quality of Life   Select  Quality of Life      Quality of Life Scores   Health/Function Pre  19.96 %    Socioeconomic Pre  24 %    Psych/Spiritual Pre  24.64 %    Family Pre  26.4 %    GLOBAL Pre  22.79 %      Scores of 19 and below usually indicate a poorer quality of life in these areas.  A difference of  2-3 points is a clinically meaningful difference.  A difference of 2-3 points in the total score of the Quality of Life Index has been associated with significant improvement in overall quality of life, self-image, physical symptoms, and general health in studies assessing change in quality of life.  PHQ-9: Recent Review Flowsheet Data    Depression screen [facility] 2/9 09/15/2018   Decreased Interest 0   Down, Depressed, Hopeless 0   PHQ - 2 Score 0   Altered sleeping 0   Tired, decreased energy 2   Change in appetite 0   Feeling bad or failure about yourself  0   Trouble concentrating 0   Moving slowly or fidgety/restless 1   Suicidal thoughts 0   PHQ-9 Score 3   Difficult doing work/chores Not difficult at all     Interpretation of Total Score  Total Score Depression Severity:  1-4 = Minimal depression, 5-9 = Mild  depression, 10-14 = Moderate depression, 15-19 = Moderately severe depression, 20-27 = Severe depression   Psychosocial Evaluation and Intervention: Psychosocial Evaluation - 09/15/18 1451      Psychosocial Evaluation & Interventions   Interventions  Encouraged to exercise with the program and follow exercise prescription;Stress management education;Relaxation education    Comments  Patient's initial QOL score was 22.79 and his PHQ-9 score was 3 with no psychosocial issues identified at orientation.    Expected Outcomes  Patient will have no psychosocial issues identified at discharge.     Continue Psychosocial Services   No Follow up required       Psychosocial Re-Evaluation: Psychosocial Re-Evaluation    [place] Name 10/07/18 0818             Psychosocial Re-Evaluation   Current issues with  None Identified       Comments  Patient's initial QOL score was 22.79 and his PHQ-9 score was 3 with no psychosocial issues identified.        Expected Outcomes  Patient will have no psychosocial issues identified at discharge.        Interventions  Stress management education;Encouraged to attend Cardiac Rehabilitation for the exercise;Relaxation education       Continue Psychosocial Services   No Follow up required          Psychosocial Discharge (Final Psychosocial Re-Evaluation): Psychosocial Re-Evaluation - 10/07/18 0818      Psychosocial Re-Evaluation   Current issues with  None Identified    Comments  Patient's initial QOL score was 22.79 and his PHQ-9 score was 3 with no psychosocial issues identified.     Expected Outcomes  Patient will have no psychosocial issues identified at discharge.  Interventions  Stress management education;Encouraged to attend Cardiac Rehabilitation for the exercise;Relaxation education    Continue Psychosocial Services   No Follow up required       Vocational Rehabilitation: Provide vocational rehab assistance to qualifying candidates.   Vocational  Rehab Evaluation & Intervention: Vocational Rehab - 09/15/18 1455      Initial Vocational Rehab Evaluation & Intervention   Assessment shows need for Vocational Rehabilitation  No       Education: Education Goals: Education classes will be provided on a weekly basis, covering required topics. Participant will state understanding/return demonstration of topics presented.  Learning Barriers/Preferences: Learning Barriers/Preferences - 09/15/18 1454      Learning Barriers/Preferences   Learning Barriers  Hearing    Learning Preferences  Written Material       Education Topics: Hypertension, Hypertension Reduction -Define heart disease and high blood pressure. Discus how high blood pressure affects the body and ways to reduce high blood pressure.   Exercise and Your Heart -Discuss why it is important to exercise, the FITT principles of exercise, normal and abnormal responses to exercise, and how to exercise safely.   Angina -Discuss definition of angina, causes of angina, treatment of angina, and how to decrease risk of having angina.   Cardiac Medications -Review what the following cardiac medications are used for, how they affect the body, and side effects that may occur when taking the medications.  Medications include Aspirin, Beta blockers, calcium channel blockers, ACE Inhibitors, angiotensin receptor blockers, diuretics, digoxin, and antihyperlipidemics.   Congestive Heart Failure -Discuss the definition of CHF, how to live with CHF, the signs and symptoms of CHF, and how keep track of weight and sodium intake.   Heart Disease and Intimacy -Discus the effect sexual activity has on the heart, how changes occur during intimacy as we age, and safety during sexual activity.   Smoking Cessation / COPD -Discuss different methods to quit smoking, the health benefits of quitting smoking, and the definition of COPD.   Nutrition I: Fats -Discuss the types of cholesterol,  what cholesterol does to the heart, and how cholesterol levels can be controlled.   CARDIAC REHAB PHASE II EXERCISE from 09/29/2018 in [place]  Date  09/22/18  Educator  D. Coad  Instruction Review Code  2- Demonstrated Understanding      Nutrition II: Labels -Discuss the different components of food labels and how to read food label   [place] from 09/29/2018 in [place]  Date  09/29/18  Educator  Etheleen Mayhew  Instruction Review Code  2- Demonstrated Understanding      Heart Parts/Heart Disease and PAD -Discuss the anatomy of the heart, the pathway of blood circulation through the heart, and these are affected by heart disease.   Stress I: Signs and Symptoms -Discuss the causes of stress, how stress may lead to anxiety and depression, and ways to limit stress.   Stress II: Relaxation -Discuss different types of relaxation techniques to limit stress.   Warning Signs of Stroke / TIA -Discuss definition of a stroke, what the signs and symptoms are of a stroke, and how to identify when someone is having stroke.   Knowledge Questionnaire Score: Knowledge Questionnaire Score - 09/15/18 1455      Knowledge Questionnaire Score   Pre Score  21/24       Core Components/Risk Factors/Patient Goals at Admission: Personal Goals and Risk Factors at Admission - 09/15/18 1455  Core Components/Risk Factors/Patient Goals on Admission    Weight Management  Weight Maintenance    Improve shortness of breath with ADL's  Yes    Expected Outcomes  Short Term: Improve cardiorespiratory fitness to achieve a reduction of symptoms when performing ADLs;Long Term: Be able to perform more ADLs without symptoms or delay the onset of symptoms    Personal Goal Other  Yes    Personal Goal  Get well; gain strength in legs; stay well; improve SOB with activities.     Intervention  Patient will attend CR 3 days/week and supplement  with exercise 2 days/week.     Expected Outcomes  Patient will meet his personal goals.        Core Components/Risk Factors/Patient Goals Review:  Goals and Risk Factor Review    Row Name 10/07/18 0808             Core Components/Risk Factors/Patient Goals Review   Personal Goals Review  Weight Management/Obesity Get well; gian strength in legs; stay well; improve SOB with activities.        Review  Patient has completed 9 sessions losing 1 lb since his orientation visit. He is doing well in the program with some progressions.  He says he does not feels much different than when he started but hopes to meet his goals as he continues. Will continue to monitor.        Expected Outcomes  Patient will continue to attend sessions and complete the program meeting his personal goals.           Core Components/Risk Factors/Patient Goals at Discharge (Final Review):  Goals and Risk Factor Review - 10/07/18 0808      Core Components/Risk Factors/Patient Goals Review   Personal Goals Review  Weight Management/Obesity   Get well; gian strength in legs; stay well; improve SOB with activities.    Review  Patient has completed 9 sessions losing 1 lb since his orientation visit. He is doing well in the program with some progressions.  He says he does not feels much different than when he started but hopes to meet his goals as he continues. Will continue to monitor.     Expected Outcomes  Patient will continue to attend sessions and complete the program meeting his personal goals.        ITP Comments: ITP Comments    Row Name 09/15/18 1435           ITP Comments  Patient is an 84 year old male referred by Dr. Irish Lack for s/p stent placement on 07/15/2018. He is HOH and is a fall risk. He has no barriers to CR.           Comments: ITP REVIEW Patient doing well in the program. Will continue to monitor for progress.

## 2018-10-08 ENCOUNTER — Encounter (HOSPITAL_COMMUNITY)
Admission: RE | Admit: 2018-10-08 | Discharge: 2018-10-08 | Disposition: A | Discharge status: Home / Self Care | Payer: Medicare Other | Source: Ambulatory Visit | Attending: Cardiovascular Disease | Admitting: Cardiovascular Disease

## 2018-10-08 DIAGNOSIS — Z955 Presence of coronary angioplasty implant and graft: Secondary | ICD-10-CM | POA: Diagnosis not present

## 2018-10-08 NOTE — Progress Notes (Signed)
Daily Session Note  Patient Details  Name: Jimmy Knox MRN: [id number] Date of Birth: 02/27/1932 Referring Provider:     [place] from 09/15/2018 in [place]  Referring Provider  [name]      Encounter Date: 10/08/2018  Check In: Session Check In - 10/08/18 1100      Check-In   Supervising physician immediately available to respond to emergencies  See telemetry face sheet for immediately available MD    Location  [facility]    Staff Present  Russella Dar, MS, EP, [facility], Exercise Physiologist;Jermany Rimel, Exercise Physiologist;Debra Wynetta Emery, RN, BSN    Medication changes reported      No    Fall or balance concerns reported     Yes    Comments  Patient has h/o 3 falls in past 12 months. He exhibits an unsteady gait and says he gets dizzy at times.     Warm-up and Cool-down  Performed as group-led Higher education careers adviser Performed  Yes    VAD Patient?  No    PAD/SET Patient?  No      Pain Assessment   Currently in Pain?  No/denies    Pain Score  0-No pain    Multiple Pain Sites  No       Capillary Blood Glucose: No results found for this or any previous visit (from the past 24 hour(s)).    Social History   Tobacco Use  Smoking Status Former Smoker  . Packs/day: 1.00  . Years: 20.00  . Pack years: 20.00  . Types: Cigarettes  . Start date: 12/30/1943  Smokeless Tobacco Never Used  Tobacco Comment   "stopped smoking in 1970s when I got pneumonia"    Goals Met:  Independence with exercise equipment Exercise tolerated well No report of cardiac concerns or symptoms Strength training completed today  Goals Unmet:  Not Applicable  Comments: Pt able to follow exercise prescription today without complaint.  Will continue to monitor for progression. Check out 12:00.   Dr. Kate Sable is Medical Director for [facility] Cardiac and Pulmonary Rehab.

## 2018-10-11 ENCOUNTER — Encounter (HOSPITAL_COMMUNITY)
Admission: RE | Admit: 2018-10-11 | Discharge: 2018-10-11 | Disposition: A | Discharge status: Home / Self Care | Payer: Medicare Other | Source: Ambulatory Visit | Attending: Cardiovascular Disease | Admitting: Cardiovascular Disease

## 2018-10-11 DIAGNOSIS — Z955 Presence of coronary angioplasty implant and graft: Secondary | ICD-10-CM

## 2018-10-11 NOTE — Progress Notes (Signed)
Daily Session Note  Patient Details  Name: Jimmy Knox MRN: [id number] Date of Birth: 11/18/1932 Referring Provider:     [place] from 09/15/2018 in [place]  Referring Provider  [name]      Encounter Date: 10/11/2018  Check In: Session Check In - 10/11/18 1100      Check-In   Supervising physician immediately available to respond to emergencies  See telemetry face sheet for immediately available MD    Location  [facility]    Staff Present  Russella Dar, MS, EP, [facility], Exercise Physiologist;Amanda Ballard, Exercise Physiologist;Audric Venn Wynetta Emery, RN, BSN    Medication changes reported      No    Fall or balance concerns reported     Yes    Comments  Patient has h/o 3 falls in past 12 months. He exhibits an unsteady gait and says he gets dizzy at times.     Warm-up and Cool-down  Performed as group-led Higher education careers adviser Performed  Yes    VAD Patient?  No    PAD/SET Patient?  No      Pain Assessment   Currently in Pain?  No/denies    Pain Score  0-No pain    Multiple Pain Sites  No       Capillary Blood Glucose: No results found for this or any previous visit (from the past 24 hour(s)).    Social History   Tobacco Use  Smoking Status Former Smoker  . Packs/day: 1.00  . Years: 20.00  . Pack years: 20.00  . Types: Cigarettes  . Start date: 12/30/1943  Smokeless Tobacco Never Used  Tobacco Comment   "stopped smoking in 1970s when I got pneumonia"    Goals Met:  Independence with exercise equipment Exercise tolerated well No report of cardiac concerns or symptoms Strength training completed today  Goals Unmet:  Not Applicable  Comments: Pt able to follow exercise prescription today without complaint.  Will continue to monitor for progression. Check out 1200.   Dr. Kate Sable is Medical Director for [facility] Cardiac and Pulmonary Rehab.

## 2018-10-13 ENCOUNTER — Encounter (HOSPITAL_COMMUNITY)
Admission: RE | Admit: 2018-10-13 | Discharge: 2018-10-13 | Disposition: A | Discharge status: Home / Self Care | Payer: Medicare Other | Source: Ambulatory Visit | Attending: Cardiovascular Disease | Admitting: Cardiovascular Disease

## 2018-10-13 DIAGNOSIS — Z955 Presence of coronary angioplasty implant and graft: Secondary | ICD-10-CM | POA: Diagnosis not present

## 2018-10-13 NOTE — Progress Notes (Signed)
Daily Session Note  Patient Details  Name: Jimmy Knox MRN: [id number] Date of Birth: 1932-11-08 Referring Provider:     [place] from 09/15/2018 in [place]  Referring Provider  [name]      Encounter Date: 10/13/2018  Check In: Session Check In - 10/13/18 1100      Check-In   Supervising physician immediately available to respond to emergencies  See telemetry face sheet for immediately available MD    Location  [facility]    Staff Present  Russella Dar, MS, EP, [facility], Exercise Physiologist;Carmesha Morocco Zachery Conch, Exercise Physiologist    Medication changes reported      No    Fall or balance concerns reported     Yes    Comments  Patient has h/o 3 falls in past 12 months. He exhibits an unsteady gait and says he gets dizzy at times.     Warm-up and Cool-down  Performed as group-led Higher education careers adviser Performed  Yes    VAD Patient?  No    PAD/SET Patient?  No      Pain Assessment   Currently in Pain?  No/denies    Pain Score  0-No pain    Multiple Pain Sites  No       Capillary Blood Glucose: No results found for this or any previous visit (from the past 24 hour(s)).    Social History   Tobacco Use  Smoking Status Former Smoker  . Packs/day: 1.00  . Years: 20.00  . Pack years: 20.00  . Types: Cigarettes  . Start date: 12/30/1943  Smokeless Tobacco Never Used  Tobacco Comment   "stopped smoking in 1970s when I got pneumonia"    Goals Met:  Independence with exercise equipment Exercise tolerated well No report of cardiac concerns or symptoms Strength training completed today  Goals Unmet:  Not Applicable  Comments: Pt able to follow exercise prescription today without complaint.  Will continue to monitor for progression. Check out 12:00.   Dr. Kate Sable is Medical Director for [facility] Cardiac and Pulmonary Rehab.

## 2018-10-15 ENCOUNTER — Encounter (HOSPITAL_COMMUNITY)
Admission: RE | Admit: 2018-10-15 | Discharge: 2018-10-15 | Disposition: A | Discharge status: Home / Self Care | Payer: Medicare Other | Source: Ambulatory Visit | Attending: Cardiovascular Disease | Admitting: Cardiovascular Disease

## 2018-10-15 DIAGNOSIS — Z955 Presence of coronary angioplasty implant and graft: Secondary | ICD-10-CM

## 2018-10-15 NOTE — Progress Notes (Signed)
Daily Session Note  Patient Details  Name: Jimmy Knox MRN: [id number] Date of Birth: Aug 08, 1932 Referring Provider:     [place] from 09/15/2018 in [place]  Referring Provider  [name]      Encounter Date: 10/15/2018  Check In: Session Check In - 10/15/18 1100      Check-In   Supervising physician immediately available to respond to emergencies  See telemetry face sheet for immediately available MD    Location  [facility]    Staff Present  Russella Dar, MS, EP, [facility], Exercise Physiologist;Anice Wilshire Zachery Conch, Exercise Physiologist    Medication changes reported      No    Fall or balance concerns reported     Yes    Comments  Patient has h/o 3 falls in past 12 months. He exhibits an unsteady gait and says he gets dizzy at times.     Warm-up and Cool-down  Performed as group-led Higher education careers adviser Performed  Yes    VAD Patient?  No    PAD/SET Patient?  No      Pain Assessment   Currently in Pain?  No/denies    Pain Score  0-No pain    Multiple Pain Sites  No       Capillary Blood Glucose: No results found for this or any previous visit (from the past 24 hour(s)).    Social History   Tobacco Use  Smoking Status Former Smoker  . Packs/day: 1.00  . Years: 20.00  . Pack years: 20.00  . Types: Cigarettes  . Start date: 12/30/1943  Smokeless Tobacco Never Used  Tobacco Comment   "stopped smoking in 1970s when I got pneumonia"    Goals Met:  Proper associated with RPD/PD & O2 Sat Independence with exercise equipment Exercise tolerated well No report of cardiac concerns or symptoms Strength training completed today  Goals Unmet:  Not Applicable  Comments: Pt able to follow exercise prescription today without complaint.  Will continue to monitor for progression. Check out 12:00.   Dr. Kate Sable is Medical Director for [facility] Cardiac and Pulmonary Rehab.

## 2018-10-18 ENCOUNTER — Encounter (HOSPITAL_COMMUNITY)
Admission: RE | Admit: 2018-10-18 | Discharge: 2018-10-18 | Disposition: A | Discharge status: Home / Self Care | Payer: Medicare Other | Source: Ambulatory Visit | Attending: Cardiovascular Disease | Admitting: Cardiovascular Disease

## 2018-10-18 DIAGNOSIS — Z955 Presence of coronary angioplasty implant and graft: Secondary | ICD-10-CM

## 2018-10-18 NOTE — Progress Notes (Signed)
Daily Session Note  Patient Details  Name: Jimmy Knox MRN: [id number] Date of Birth: Aug 17, 1932 Referring Provider:     [place] from 09/15/2018 in [place]  Referring Provider  [name]      Encounter Date: 10/18/2018  Check In: Session Check In - 10/18/18 1100      Check-In   Supervising physician immediately available to respond to emergencies  See telemetry face sheet for immediately available MD    Location  [facility]    Staff Present  Russella Dar, MS, EP, [facility], Exercise Physiologist;Mihailo Sage Zachery Conch, Exercise Physiologist    Medication changes reported      No    Fall or balance concerns reported     Yes    Comments  Patient has h/o 3 falls in past 12 months. He exhibits an unsteady gait and says he gets dizzy at times.     Warm-up and Cool-down  Performed as group-led Higher education careers adviser Performed  Yes    VAD Patient?  No    PAD/SET Patient?  No      Pain Assessment   Currently in Pain?  No/denies    Pain Score  0-No pain    Multiple Pain Sites  No       Capillary Blood Glucose: No results found for this or any previous visit (from the past 24 hour(s)).    Social History   Tobacco Use  Smoking Status Former Smoker  . Packs/day: 1.00  . Years: 20.00  . Pack years: 20.00  . Types: Cigarettes  . Start date: 12/30/1943  Smokeless Tobacco Never Used  Tobacco Comment   "stopped smoking in 1970s when I got pneumonia"    Goals Met:  Proper associated with RPD/PD & O2 Sat Independence with exercise equipment Exercise tolerated well No report of cardiac concerns or symptoms Strength training completed today  Goals Unmet:  Not Applicable  Comments: Pt able to follow exercise prescription today without complaint.  Will continue to monitor for progression. Check out 12:00.   Dr. Kate Sable is Medical Director for [facility] Cardiac and Pulmonary Rehab.

## 2018-10-20 ENCOUNTER — Encounter (HOSPITAL_COMMUNITY)
Admission: RE | Admit: 2018-10-20 | Discharge: 2018-10-20 | Disposition: A | Discharge status: Home / Self Care | Payer: Medicare Other | Source: Ambulatory Visit | Attending: Cardiovascular Disease | Admitting: Cardiovascular Disease

## 2018-10-20 DIAGNOSIS — Z955 Presence of coronary angioplasty implant and graft: Secondary | ICD-10-CM

## 2018-10-20 NOTE — Progress Notes (Signed)
Daily Session Note  Patient Details  Name: Jimmy Knox MRN: [id number] Date of Birth: July 09, 1932 Referring Provider:     [place] from 09/15/2018 in [place]  Referring Provider  [name]      Encounter Date: 10/20/2018  Check In: Session Check In - 10/20/18 1100      Check-In   Supervising physician immediately available to respond to emergencies  See telemetry face sheet for immediately available MD    Location  [facility]    Staff Present  Russella Dar, MS, EP, [facility], Exercise Physiologist;Delisia Mcquiston Zachery Conch, Exercise Physiologist    Medication changes reported      No    Fall or balance concerns reported     Yes    Comments  Patient has h/o 3 falls in past 12 months. He exhibits an unsteady gait and says he gets dizzy at times.     Warm-up and Cool-down  Performed as group-led Higher education careers adviser Performed  Yes    VAD Patient?  No    PAD/SET Patient?  No      Pain Assessment   Currently in Pain?  No/denies    Pain Score  0-No pain    Multiple Pain Sites  No       Capillary Blood Glucose: No results found for this or any previous visit (from the past 24 hour(s)).    Social History   Tobacco Use  Smoking Status Former Smoker  . Packs/day: 1.00  . Years: 20.00  . Pack years: 20.00  . Types: Cigarettes  . Start date: 12/30/1943  Smokeless Tobacco Never Used  Tobacco Comment   "stopped smoking in 1970s when I got pneumonia"    Goals Met:  Proper associated with RPD/PD & O2 Sat Independence with exercise equipment Exercise tolerated well No report of cardiac concerns or symptoms Strength training completed today  Goals Unmet:  Not Applicable  Comments: Pt able to follow exercise prescription today without complaint.  Will continue to monitor for progression. Check out 12:00.   Dr. Kate Sable is Medical Director for [facility] Cardiac and Pulmonary Rehab.

## 2018-10-22 ENCOUNTER — Encounter (HOSPITAL_COMMUNITY)
Admission: RE | Admit: 2018-10-22 | Discharge: 2018-10-22 | Disposition: A | Discharge status: Home / Self Care | Payer: Medicare Other | Source: Ambulatory Visit | Attending: Cardiovascular Disease | Admitting: Cardiovascular Disease

## 2018-10-22 DIAGNOSIS — Z955 Presence of coronary angioplasty implant and graft: Secondary | ICD-10-CM | POA: Diagnosis not present

## 2018-10-22 NOTE — Progress Notes (Signed)
Daily Session Note  Patient Details  Name: Jimmy Knox MRN: [id number] Date of Birth: 1932-07-15 Referring Provider:     [place] from 09/15/2018 in [place]  Referring Provider  [name]      Encounter Date: 10/22/2018  Check In: Session Check In - 10/22/18 1100      Check-In   Supervising physician immediately available to respond to emergencies  See telemetry face sheet for immediately available MD    Location  [facility]    Staff Present  Benay Pike, Exercise Physiologist;Dori Devino Wynetta Emery, RN, BSN    Medication changes reported      No    Fall or balance concerns reported     Yes    Comments  Patient has h/o 3 falls in past 12 months. He exhibits an unsteady gait and says he gets dizzy at times.     Warm-up and Cool-down  Performed as group-led Higher education careers adviser Performed  Yes    VAD Patient?  No    PAD/SET Patient?  No      Pain Assessment   Currently in Pain?  No/denies    Pain Score  0-No pain    Multiple Pain Sites  No       Capillary Blood Glucose: No results found for this or any previous visit (from the past 24 hour(s)).    Social History   Tobacco Use  Smoking Status Former Smoker  . Packs/day: 1.00  . Years: 20.00  . Pack years: 20.00  . Types: Cigarettes  . Start date: 12/30/1943  Smokeless Tobacco Never Used  Tobacco Comment   "stopped smoking in 1970s when I got pneumonia"    Goals Met:  Independence with exercise equipment Exercise tolerated well No report of cardiac concerns or symptoms Strength training completed today  Goals Unmet:  Not Applicable  Comments: Pt able to follow exercise prescription today without complaint.  Will continue to monitor for progression. Check out 1200.   Dr. Kate Sable is Medical Director for [facility] Cardiac and Pulmonary Rehab.

## 2018-10-25 ENCOUNTER — Encounter (HOSPITAL_COMMUNITY)
Admission: RE | Admit: 2018-10-25 | Discharge: 2018-10-25 | Disposition: A | Discharge status: Home / Self Care | Payer: Medicare Other | Source: Ambulatory Visit | Attending: Cardiovascular Disease | Admitting: Cardiovascular Disease

## 2018-10-25 DIAGNOSIS — Z955 Presence of coronary angioplasty implant and graft: Secondary | ICD-10-CM

## 2018-10-25 NOTE — Progress Notes (Signed)
Daily Session Note  Patient Details  Name: Jimmy Knox MRN: [id number] Date of Birth: 09/22/1932 Referring Provider:     [place] from 09/15/2018 in [place]  Referring Provider  [name]      Encounter Date: 10/25/2018  Check In: Session Check In - 10/25/18 1100      Check-In   Supervising physician immediately available to respond to emergencies  See telemetry face sheet for immediately available MD    Location  [facility]    Staff Present  Benay Pike, Exercise Physiologist;Veda Arrellano Wynetta Emery, RN, BSN;Diane Coad, MS, EP, [facility], Exercise Physiologist    Medication changes reported      No    Fall or balance concerns reported     Yes    Comments  Patient has h/o 3 falls in past 12 months. He exhibits an unsteady gait and says he gets dizzy at times.     Warm-up and Cool-down  Performed as group-led Higher education careers adviser Performed  Yes    VAD Patient?  No    PAD/SET Patient?  No      Pain Assessment   Currently in Pain?  No/denies    Pain Score  0-No pain    Multiple Pain Sites  No       Capillary Blood Glucose: No results found for this or any previous visit (from the past 24 hour(s)).    Social History   Tobacco Use  Smoking Status Former Smoker  . Packs/day: 1.00  . Years: 20.00  . Pack years: 20.00  . Types: Cigarettes  . Start date: 12/30/1943  Smokeless Tobacco Never Used  Tobacco Comment   "stopped smoking in 1970s when I got pneumonia"    Goals Met:  Exercise tolerated well No report of cardiac concerns or symptoms Strength training completed today  Goals Unmet:  Not Applicable  Comments: Pt able to follow exercise prescription today without complaint.  Will continue to monitor for progression. Check out 1200.   Dr. Kate Sable is Medical Director for [facility] Cardiac and Pulmonary Rehab.

## 2018-10-27 ENCOUNTER — Encounter (HOSPITAL_COMMUNITY)
Admission: RE | Admit: 2018-10-27 | Discharge: 2018-10-27 | Disposition: A | Discharge status: Home / Self Care | Payer: Medicare Other | Source: Ambulatory Visit | Attending: Cardiovascular Disease | Admitting: Cardiovascular Disease

## 2018-10-27 DIAGNOSIS — Z955 Presence of coronary angioplasty implant and graft: Secondary | ICD-10-CM | POA: Diagnosis not present

## 2018-10-27 NOTE — Progress Notes (Signed)
Daily Session Note  Patient Details  Name: Jimmy Knox MRN: [id number] Date of Birth: 18-May-1932 Referring Provider:     [name] from 09/15/2018 in [place]  Referring Provider  [name]      Encounter Date: 10/27/2018  Check In: Session Check In - 10/27/18 1100      Check-In   Supervising physician immediately available to respond to emergencies  See telemetry face sheet for immediately available MD    Location  [facility]    Staff Present  Benay Pike, Exercise Physiologist;Myrtle Haller Wynetta Emery, RN, BSN    Medication changes reported      No    Fall or balance concerns reported     Yes    Comments  Patient has h/o 3 falls in past 12 months. He exhibits an unsteady gait and says he gets dizzy at times.     Warm-up and Cool-down  Performed as group-led Higher education careers adviser Performed  Yes    VAD Patient?  No    PAD/SET Patient?  No      Pain Assessment   Currently in Pain?  No/denies    Pain Score  0-No pain    Multiple Pain Sites  No       Capillary Blood Glucose: No results found for this or any previous visit (from the past 24 hour(s)).    Social History   Tobacco Use  Smoking Status Former Smoker  . Packs/day: 1.00  . Years: 20.00  . Pack years: 20.00  . Types: Cigarettes  . Start date: 12/30/1943  Smokeless Tobacco Never Used  Tobacco Comment   "stopped smoking in 1970s when I got pneumonia"    Goals Met:  Independence with exercise equipment Exercise tolerated well No report of cardiac concerns or symptoms Strength training completed today  Goals Unmet:  Not Applicable  Comments: Pt able to follow exercise prescription today without complaint.  Will continue to monitor for progression. Check out 1200.   Dr. Kate Sable is Medical Director for [facility] Cardiac and Pulmonary Rehab.

## 2018-10-29 ENCOUNTER — Encounter (HOSPITAL_COMMUNITY)
Admission: RE | Admit: 2018-10-29 | Discharge: 2018-10-29 | Disposition: A | Discharge status: Home / Self Care | Payer: Medicare Other | Source: Ambulatory Visit | Attending: Cardiovascular Disease | Admitting: Cardiovascular Disease

## 2018-10-29 DIAGNOSIS — Z955 Presence of coronary angioplasty implant and graft: Secondary | ICD-10-CM | POA: Insufficient documentation

## 2018-10-29 NOTE — Progress Notes (Signed)
Daily Session Note  Patient Details  Name: Jimmy Knox MRN: [id number] Date of Birth: [birth year] Referring Provider:     [place] from 09/15/2018 in [place]  Referring Provider  [name]      Encounter Date: 10/29/2018  Check In: Session Check In - 10/29/18 1100      Check-In   Supervising physician immediately available to respond to emergencies  See telemetry face sheet for immediately available MD    Location  [facility]    Staff Present  Aundra Dubin, RN, BSN;Diane Coad, MS, EP, [facility], Exercise Physiologist    Medication changes reported      No    Fall or balance concerns reported     Yes    Comments  Patient has h/o 3 falls in past 12 months. He exhibits an unsteady gait and says he gets dizzy at times.     Warm-up and Cool-down  Performed as group-led Higher education careers adviser Performed  Yes    VAD Patient?  No    PAD/SET Patient?  No      Pain Assessment   Currently in Pain?  No/denies    Pain Score  0-No pain    Multiple Pain Sites  No       Capillary Blood Glucose: No results found for this or any previous visit (from the past 24 hour(s)).    Social History   Tobacco Use  Smoking Status Former Smoker  . Packs/day: 1.00  . Years: 20.00  . Pack years: 20.00  . Types: Cigarettes  . Start date: 12/30/1943  Smokeless Tobacco Never Used  Tobacco Comment   "stopped smoking in 1970s when I got pneumonia"    Goals Met:  Exercise tolerated well No report of cardiac concerns or symptoms Strength training completed today  Goals Unmet:  Not Applicable  Comments: Pt able to follow exercise prescription today without complaint.  Will continue to monitor for progression. Check out 1200.   Dr. Kate Sable is Medical Director for [facility] Cardiac and Pulmonary Rehab.

## 2018-11-01 ENCOUNTER — Encounter (HOSPITAL_COMMUNITY)
Admission: RE | Admit: 2018-11-01 | Discharge: 2018-11-01 | Disposition: A | Discharge status: Home / Self Care | Payer: Medicare Other | Source: Ambulatory Visit | Attending: Cardiovascular Disease | Admitting: Cardiovascular Disease

## 2018-11-01 DIAGNOSIS — Z955 Presence of coronary angioplasty implant and graft: Secondary | ICD-10-CM | POA: Diagnosis not present

## 2018-11-01 NOTE — Progress Notes (Signed)
Daily Session Note  Patient Details  Name: Jimmy Knox MRN: [id number] Date of Birth: 08/02/1932 Referring Provider:     [place] from 09/15/2018 in [place]  Referring Provider  [name]      Encounter Date: 11/01/2018  Check In: Session Check In - 11/01/18 1100      Check-In   Supervising physician immediately available to respond to emergencies  See telemetry face sheet for immediately available MD    Location  [facility]    Staff Present  Aundra Dubin, RN, BSN;Diane Coad, MS, EP, [facility] Exercise Physiologist    Medication changes reported      No    Fall or balance concerns reported     Yes    Comments  Patient has h/o 3 falls in past 12 months. He exhibits an unsteady gait and says he gets dizzy at times.     Warm-up and Cool-down  Performed as group-led Higher education careers adviser Performed  Yes    VAD Patient?  No    PAD/SET Patient?  No      Pain Assessment   Currently in Pain?  No/denies    Pain Score  0-No pain    Multiple Pain Sites  No       Capillary Blood Glucose: No results found for this or any previous visit (from the past 24 hour(s)).    Social History   Tobacco Use  Smoking Status Former Smoker  . Packs/day: 1.00  . Years: 20.00  . Pack years: 20.00  . Types: Cigarettes  . Start date: 12/30/1943  Smokeless Tobacco Never Used  Tobacco Comment   "stopped smoking in 1970s when I got pneumonia"    Goals Met:  Independence with exercise equipment Exercise tolerated well No report of cardiac concerns or symptoms Strength training completed today  Goals Unmet:  Not Applicable  Comments: Pt able to follow exercise prescription today without complaint.  Will continue to monitor for progression. Check out 1200.   Dr. Kate Sable is Medical Director for [facility] Cardiac and Pulmonary Rehab.

## 2018-11-03 ENCOUNTER — Encounter (HOSPITAL_COMMUNITY)
Admission: RE | Admit: 2018-11-03 | Discharge: 2018-11-03 | Disposition: A | Discharge status: Home / Self Care | Payer: Medicare Other | Source: Ambulatory Visit | Attending: Cardiovascular Disease | Admitting: Cardiovascular Disease

## 2018-11-03 DIAGNOSIS — Z955 Presence of coronary angioplasty implant and graft: Secondary | ICD-10-CM | POA: Diagnosis not present

## 2018-11-03 NOTE — Progress Notes (Signed)
Daily Session Note  Patient Details  Name: DEANTE BLOUGH MRN: [id number] Date of Birth: 08/06/1932 Referring Provider:     [place] from 09/15/2018 in [place]  Referring Provider  [name]      Encounter Date: 11/03/2018  Check In: Session Check In - 11/03/18 1100      Check-In   Supervising physician immediately available to respond to emergencies  See telemetry face sheet for immediately available MD    Location  [facility]    Staff Present  Aundra Dubin, RN, BSN;Diane Coad, MS, EP, [facility], Exercise Physiologist;Amanda Zachery Conch, Exercise Physiologist    Medication changes reported      No    Fall or balance concerns reported     Yes    Comments  Patient has h/o 3 falls in past 12 months. He exhibits an unsteady gait and says he gets dizzy at times.     Warm-up and Cool-down  Performed as group-led Higher education careers adviser Performed  Yes    VAD Patient?  No    PAD/SET Patient?  No      Pain Assessment   Currently in Pain?  No/denies    Pain Score  0-No pain    Multiple Pain Sites  No       Capillary Blood Glucose: No results found for this or any previous visit (from the past 24 hour(s)).    Social History   Tobacco Use  Smoking Status Former Smoker  . Packs/day: 1.00  . Years: 20.00  . Pack years: 20.00  . Types: Cigarettes  . Start date: 12/30/1943  Smokeless Tobacco Never Used  Tobacco Comment   "stopped smoking in 1970s when I got pneumonia"    Goals Met:  Independence with exercise equipment Exercise tolerated well No report of cardiac concerns or symptoms Strength training completed today  Goals Unmet:  Not Applicable  Comments: Pt able to follow exercise prescription today without complaint.  Will continue to monitor for progression. Check out 1200.  Dr. Mardene Celeste is the cardiac medical director for [facility].

## 2018-11-04 NOTE — Progress Notes (Signed)
Cardiac Individual Treatment Plan  Patient Details  Name: Jimmy Knox MRN: [id number] Date of Birth: 01/19/1932 Referring Provider:     [place] from 09/15/2018 in [place]  Referring Provider  [name]      Initial Encounter Date:    CARDIAC REHAB PHASE II ORIENTATION from 09/15/2018 in [place]  Date  09/15/18      Visit Diagnosis: Status post coronary artery stent placement  Patient's Home Medications on Admission:  Current Outpatient Medications:  .  amitriptyline (ELAVIL) 50 MG tablet, Take 50 mg by mouth at bedtime., Disp: , Rfl:  .  aspirin EC 81 MG tablet, Take 81 mg by mouth daily., Disp: , Rfl:  .  clopidogrel (PLAVIX) 75 MG tablet, Take 1 tablet (75 mg total) by mouth daily., Disp: 90 tablet, Rfl: 3 .  losartan (COZAAR) 25 MG tablet, TAKE 1 TABLET BY MOUTH ONCE DAILY., Disp: 90 tablet, Rfl: 3 .  metoprolol succinate (TOPROL-XL) 25 MG 24 hr tablet, Take 25 mg by mouth daily., Disp: , Rfl:  .  nitroGLYCERIN (NITROSTAT) 0.4 MG SL tablet, Place 1 tablet (0.4 mg total) under the tongue every 5 (five) minutes as needed for chest pain., Disp: 25 tablet, Rfl: 2 .  simvastatin (ZOCOR) 40 MG tablet, Take 40 mg by mouth every evening. , Disp: , Rfl:   Past Medical History: Past Medical History:  Diagnosis Date  . Anxiety   . Arthritis    "right hip" (07/15/2018)  . Carotid artery disease ([place])    a. <50% bilaterally 05/2016.  Marland Kitchen Chronic combined systolic and diastolic CHF (congestive heart failure) ([place])   . Complication of anesthesia    "didn't wake up well after OR on 07/30/2016; took 2-3 to hold me down"  . Coronary artery disease    a. prior MI/stent around 2000. b. Nuc 09/2015 -> abnl, mgmd medically.  . Facial neuralgia   . History of blood transfusion 07/30/2016   "after the operation"  . Hyperlipidemia   . Hypertension   . Ischemic cardiomyopathy   . Melanoma of nose ([place])    "inside  on the right side"  . Myocardial infarction ([place]) ~ 2000  . Pneumonia 1935- 1970s ,"several times"  . [place] spotted fever 1942  . Skin cancer    "on my back; arms; froze them off"  . Type 2 diabetes, diet controlled ([place])     Tobacco Use: Social History   Tobacco Use  Smoking Status Former Smoker  . Packs/day: 1.00  . Years: 20.00  . Pack years: 20.00  . Types: Cigarettes  . Start date: 12/30/1943  Smokeless Tobacco Never Used  Tobacco Comment   "stopped smoking in 1970s when I got pneumonia"    Labs: Recent Review Flowsheet Data    Labs for ITP Cardiac and Pulmonary Rehab Latest Ref Rng & Units 06/23/2013 07/21/2016   Hemoglobin A1c 4.8 - 5.6 % - 6.1(H)   PHART 7.350 - 7.450 7.403 -   PCO2ART 35.0 - 45.0 mmHg 35.4 -   HCO3 20.0 - 24.0 mEq/L 21.6 -   TCO2 0 - 100 mmol/L 19.0 -   ACIDBASEDEF 0.0 - 2.0 mmol/L 2.4(H) -   O2SAT % 97.0 -      Capillary Blood Glucose: Lab Results  Component Value Date   GLUCAP 97 07/15/2018   GLUCAP 109 (H) 07/15/2018   GLUCAP 129 (H) 07/29/2016   GLUCAP 114 (H) 07/21/2016   GLUCAP 108 (H)  09/28/2015     Exercise Target Goals: Exercise Program Goal: Individual exercise prescription set using results from initial 6 min walk test and THRR while considering  patient's activity barriers and safety.   Exercise Prescription Goal: Starting with aerobic activity 30 plus minutes a day, 3 days per week for initial exercise prescription. Provide home exercise prescription and guidelines that participant acknowledges understanding prior to discharge.  Activity Barriers & Risk Stratification: Activity Barriers & Cardiac Risk Stratification - 09/15/18 1411      Activity Barriers & Cardiac Risk Stratification   Activity Barriers  Muscular Weakness;Shortness of Breath    Cardiac Risk Stratification  High       6 Minute Walk: 6 Minute Walk    Row Name 09/15/18 1409         6 Minute Walk   Phase  Initial     Distance  850 feet      Walk Time  6 minutes     # of Rest Breaks  0     MPH  1.61     METS  2.23     RPE  11     Perceived Dyspnea   9     VO2 Peak  4.93     Symptoms  No     Resting HR  75 bpm     Resting BP  92/50     Resting Oxygen Saturation   97 %     Exercise Oxygen Saturation  during 6 min walk  84 %     Max Ex. HR  112 bpm     Max Ex. BP  122/52     2 Minute Post BP  100/60        Oxygen Initial Assessment:   Oxygen Re-Evaluation:   Oxygen Discharge (Final Oxygen Re-Evaluation):   Initial Exercise Prescription: Initial Exercise Prescription - 09/15/18 1400      Date of Initial Exercise RX and Referring Provider   Date  09/15/18    Referring Provider  Koneswaran      NuStep   Level  1    SPM  47    Minutes  17    METs  1.8      Arm Ergometer   Level  1    Watts  14    RPM  61    Minutes  17    METs  2      Prescription Details   Frequency (times per week)  3    Duration  Progress to 30 minutes of continuous aerobic without signs/symptoms of physical distress      Intensity   THRR 40-80% of Max Heartrate  [phone]    Ratings of Perceived Exertion  11-13    Perceived Dyspnea  0-4      Progression   Progression  Continue to progress workloads to maintain intensity without signs/symptoms of physical distress.      Resistance Training   Training Prescription  Yes    Weight  1    Reps  10-15       Perform Capillary Blood Glucose checks as needed.  Exercise Prescription Changes:  Exercise Prescription Changes    Row Name 09/15/18 1400 09/28/18 1200 10/19/18 1100         Response to Exercise   Blood Pressure (Admit)  -  122/64  110/64     Blood Pressure (Exercise)  -  130/64  124/60     Blood Pressure (Exit)  -  118/66  114/60     Heart Rate (Admit)  -  94 bpm  50 bpm     Heart Rate (Exercise)  -  109 bpm  114 bpm     Heart Rate (Exit)  -  82 bpm  97 bpm     Rating of Perceived Exertion (Exercise)  -  11  11     Comments  -  first full week of exercise    increased overall MET level      Duration  -  Progress to 30 minutes of  aerobic without signs/symptoms of physical distress  Progress to 30 minutes of  aerobic without signs/symptoms of physical distress     Intensity  -  THRR unchanged  THRR unchanged       Progression   Progression  -  Continue to progress workloads to maintain intensity without signs/symptoms of physical distress.  Continue to progress workloads to maintain intensity without signs/symptoms of physical distress.     Average METs  -  2.3  2.4       Resistance Training   Training Prescription  -  Yes  Yes     Weight  -  1  2     Reps  -  10-15  10-15       NuStep   Level  -  1  2     SPM  -  100  110     Minutes  -  22  22     METs  -  2.3  2.1       Arm Ergometer   Level  -  1  2     Watts  -  17  21     RPM  -  63  63     Minutes  -  17  17     METs  -  2.3  2.7       Home Exercise Plan   Plans to continue exercise at  Home (comment) walking  Home (comment)  Home (comment)     Frequency  Add 2 additional days to program exercise sessions.  Add 2 additional days to program exercise sessions.  Add 2 additional days to program exercise sessions.     Initial Home Exercises Provided  09/15/18  09/15/18  09/15/18        Exercise Comments:  Exercise Comments    Row Name 10/05/18 0102 11/03/18 1440         Exercise Comments  Patient has tolerated exercise and progressions well. Will continue to monitor and progress as needed.   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 He is back to using a tractor and moving limbs by himself.          Exercise Goals and Review:  Exercise Goals    Row Name 09/15/18 1412             Exercise Goals   Increase Physical Activity  Yes       Intervention  Provide advice, education, support and counseling about physical activity/exercise needs.;Develop an individualized exercise prescription for aerobic and resistive training based on initial  evaluation findings, risk stratification, comorbidities and participant's personal goals.       Expected Outcomes  Short Term: Attend rehab on a regular basis to increase amount of physical activity.;Long Term: Add in home exercise to make exercise part of routine and to increase amount of physical activity.;Long  Term: Exercising regularly at least 3-5 days a week.       Increase Strength and Stamina  Yes       Intervention  Provide advice, education, support and counseling about physical activity/exercise needs.;Develop an individualized exercise prescription for aerobic and resistive training based on initial evaluation findings, risk stratification, comorbidities and participant's personal goals.       Expected Outcomes  Short Term: Increase workloads from initial exercise prescription for resistance, speed, and METs.;Short Term: Perform resistance training exercises routinely during rehab and add in resistance training at home;Long Term: Improve cardiorespiratory fitness, muscular endurance and strength as measured by increased METs and functional capacity (6MWT)       Able to understand and use rate of perceived exertion (RPE) scale  Yes       Intervention  Provide education and explanation on how to use RPE scale       Expected Outcomes  Short Term: Able to use RPE daily in rehab to express subjective intensity level;Long Term:  Able to use RPE to guide intensity level when exercising independently       Able to understand and use Dyspnea scale  Yes       Intervention  Provide education and explanation on how to use Dyspnea scale       Expected Outcomes  Short Term: Able to use Dyspnea scale daily in rehab to express subjective sense of shortness of breath during exertion;Long Term: Able to use Dyspnea scale to guide intensity level when exercising independently       Knowledge and understanding of Target Heart Rate Range (THRR)  Yes       Intervention  Provide education and explanation of THRR  including how the numbers were predicted and where they are located for reference       Expected Outcomes  Short Term: Able to state/look up THRR;Long Term: Able to use THRR to govern intensity when exercising independently;Short Term: Able to use daily as guideline for intensity in rehab       Able to check pulse independently  Yes       Intervention  Provide education and demonstration on how to check pulse in carotid and radial arteries.;Review the importance of being able to check your own pulse for safety during independent exercise       Expected Outcomes  Short Term: Able to explain why pulse checking is important during independent exercise;Long Term: Able to check pulse independently and accurately       Understanding of Exercise Prescription  Yes       Intervention  Provide education, explanation, and written materials on patient's individual exercise prescription       Expected Outcomes  Short Term: Able to explain program exercise prescription;Long Term: Able to explain home exercise prescription to exercise independently          Exercise Goals Re-Evaluation : Exercise Goals Re-Evaluation    Row Name 10/05/18 0809 11/03/18 1438           Exercise Goal Re-Evaluation   Exercise Goals Review  Increase Physical Activity;Able to understand and use rate of perceived exertion (RPE) scale;Knowledge and understanding of Target Heart Rate Range (THRR);Understanding of Exercise Prescription;Increase Strength and Stamina;Able to understand and use Dyspnea scale;Able to check pulse independently  Increase Physical Activity;Able to understand and use rate of perceived exertion (RPE) scale;Knowledge and understanding of Target Heart Rate Range (THRR);Understanding of Exercise Prescription;Increase Strength and Stamina;Able to understand and use Dyspnea scale;Able to check  pulse independently      Comments  Patient has done well in the program so far. He has completed 8 sessions so far. He has  tolerated exercise well and is always working hard to increase his activity level.   Patient continues to do well in the program. He attends regularly and always works hard to increase overall MET levels.       Expected Outcomes  Gain strength and improve shortness of breath with activity.   Increase strength in legs and improve SOB with activity.           Discharge Exercise Prescription (Final Exercise Prescription Changes): Exercise Prescription Changes - 10/19/18 1100      Response to Exercise   Blood Pressure (Admit)  110/64    Blood Pressure (Exercise)  124/60    Blood Pressure (Exit)  114/60    Heart Rate (Admit)  50 bpm    Heart Rate (Exercise)  114 bpm    Heart Rate (Exit)  97 bpm    Rating of Perceived Exertion (Exercise)  11    Comments  increased overall MET level     Duration  Progress to 30 minutes of  aerobic without signs/symptoms of physical distress    Intensity  THRR unchanged      Progression   Progression  Continue to progress workloads to maintain intensity without signs/symptoms of physical distress.    Average METs  2.4      Resistance Training   Training Prescription  Yes    Weight  2    Reps  10-15      NuStep   Level  2    SPM  110    Minutes  22    METs  2.1      Arm Ergometer   Level  2    Watts  21    RPM  63    Minutes  17    METs  2.7      Home Exercise Plan   Plans to continue exercise at  Home (comment)    Frequency  Add 2 additional days to program exercise sessions.    Initial Home Exercises Provided  09/15/18       Nutrition:  Target Goals: Understanding of nutrition guidelines, daily intake of sodium '1500mg'$ , cholesterol '200mg'$ , calories 30% from fat and 7% or less from saturated fats, daily to have 5 or more servings of fruits and vegetables.  Biometrics: Pre Biometrics - 09/15/18 1413      Pre Biometrics   Height  6' (1.829 m)    Weight  95.2 kg    Waist Circumference  35.5 inches    Hip Circumference  39.5 inches     Waist to Hip Ratio  0.9 %    BMI (Calculated)  28.46    Triceps Skinfold  5 mm    % Body Fat  21.4 %    Grip Strength  31.1 kg    Flexibility  0 in    Single Leg Stand  1.64 seconds        Nutrition Therapy Plan and Nutrition Goals: Nutrition Therapy & Goals - 11/04/18 0748      Nutrition Therapy   RD appointment deferred  Yes      Personal Nutrition Goals   Comments  Patient continues to say he is eating heart healthy. Will continue to montior for progress.        Nutrition Assessments: Nutrition Assessments - 09/15/18 1454  MEDFICTS Scores   Pre Score  24       Nutrition Goals Re-Evaluation:   Nutrition Goals Discharge (Final Nutrition Goals Re-Evaluation):   Psychosocial: Target Goals: Acknowledge presence or absence of significant depression and/or stress, maximize coping skills, provide positive support system. Participant is able to verbalize types and ability to use techniques and skills needed for reducing stress and depression.  Initial Review & Psychosocial Screening: Initial Psych Review & Screening - 09/15/18 1450      Initial Review   Current issues with  None Identified      Family Dynamics   Good Support System?  Yes    Comments  Patient has good family support.       Barriers   Psychosocial barriers to participate in program  There are no identifiable barriers or psychosocial needs.;The patient should benefit from training in stress management and relaxation.      Screening Interventions   Interventions  Encouraged to exercise;Provide feedback about the scores to participant    Expected Outcomes  Long Term goal: The participant improves quality of Life and PHQ9 Scores as seen by post scores and/or verbalization of changes       Quality of Life Scores: Quality of Life - 09/15/18 1414      Quality of Life   Select  Quality of Life      Quality of Life Scores   Health/Function Pre  19.96 %    Socioeconomic Pre  24 %     Psych/Spiritual Pre  24.64 %    Family Pre  26.4 %    GLOBAL Pre  22.79 %      Scores of 19 and below usually indicate a poorer quality of life in these areas.  A difference of  2-3 points is a clinically meaningful difference.  A difference of 2-3 points in the total score of the Quality of Life Index has been associated with significant improvement in overall quality of life, self-image, physical symptoms, and general health in studies assessing change in quality of life.  PHQ-9: Recent Review Flowsheet Data    Depression screen [facility] 2/9 09/15/2018   Decreased Interest 0   Down, Depressed, Hopeless 0   PHQ - 2 Score 0   Altered sleeping 0   Tired, decreased energy 2   Change in appetite 0   Feeling bad or failure about yourself  0   Trouble concentrating 0   Moving slowly or fidgety/restless 1   Suicidal thoughts 0   PHQ-9 Score 3   Difficult doing work/chores Not difficult at all     Interpretation of Total Score  Total Score Depression Severity:  1-4 = Minimal depression, 5-9 = Mild depression, 10-14 = Moderate depression, 15-19 = Moderately severe depression, 20-27 = Severe depression   Psychosocial Evaluation and Intervention: Psychosocial Evaluation - 09/15/18 1451      Psychosocial Evaluation & Interventions   Interventions  Encouraged to exercise with the program and follow exercise prescription;Stress management education;Relaxation education    Comments  Patient's initial QOL score was 22.79 and his PHQ-9 score was 3 with no psychosocial issues identified at orientation.    Expected Outcomes  Patient will have no psychosocial issues identified at discharge.     Continue Psychosocial Services   No Follow up required       Psychosocial Re-Evaluation: Psychosocial Re-Evaluation    [place] Name 10/07/18 0818 11/04/18 0751           Psychosocial Re-Evaluation  Current issues with  None Identified  None Identified      Comments  Patient's initial QOL score was 22.79  and his PHQ-9 score was 3 with no psychosocial issues identified.   Patient's initial QOL score was 22.79 and his PHQ-9 score was 3 with no psychosocial issues identified.       Expected Outcomes  Patient will have no psychosocial issues identified at discharge.   Patient will have no psychosocial issues identified at discharge.       Interventions  Stress management education;Encouraged to attend Cardiac Rehabilitation for the exercise;Relaxation education  Stress management education;Encouraged to attend Cardiac Rehabilitation for the exercise;Relaxation education      Continue Psychosocial Services   No Follow up required  No Follow up required         Psychosocial Discharge (Final Psychosocial Re-Evaluation): Psychosocial Re-Evaluation - 11/04/18 0751      Psychosocial Re-Evaluation   Current issues with  None Identified    Comments  Patient's initial QOL score was 22.79 and his PHQ-9 score was 3 with no psychosocial issues identified.     Expected Outcomes  Patient will have no psychosocial issues identified at discharge.     Interventions  Stress management education;Encouraged to attend Cardiac Rehabilitation for the exercise;Relaxation education    Continue Psychosocial Services   No Follow up required       Vocational Rehabilitation: Provide vocational rehab assistance to qualifying candidates.   Vocational Rehab Evaluation & Intervention: Vocational Rehab - 09/15/18 1455      Initial Vocational Rehab Evaluation & Intervention   Assessment shows need for Vocational Rehabilitation  No       Education: Education Goals: Education classes will be provided on a weekly basis, covering required topics. Participant will state understanding/return demonstration of topics presented.  Learning Barriers/Preferences: Learning Barriers/Preferences - 09/15/18 1454      Learning Barriers/Preferences   Learning Barriers  Hearing    Learning Preferences  Written Material        Education Topics: Hypertension, Hypertension Reduction -Define heart disease and high blood pressure. Discus how high blood pressure affects the body and ways to reduce high blood pressure.   CARDIAC REHAB PHASE II EXERCISE from 11/03/2018 in [place]  Date  11/03/18  Educator  D. Coad  Instruction Review Code  2- Demonstrated Understanding      Exercise and Your Heart -Discuss why it is important to exercise, the FITT principles of exercise, normal and abnormal responses to exercise, and how to exercise safely.   Angina -Discuss definition of angina, causes of angina, treatment of angina, and how to decrease risk of having angina.   Cardiac Medications -Review what the following cardiac medications are used for, how they affect the body, and side effects that may occur when taking the medications.  Medications include Aspirin, Beta blockers, calcium channel blockers, ACE Inhibitors, angiotensin receptor blockers, diuretics, digoxin, and antihyperlipidemics.   Congestive Heart Failure -Discuss the definition of CHF, how to live with CHF, the signs and symptoms of CHF, and how keep track of weight and sodium intake.   Heart Disease and Intimacy -Discus the effect sexual activity has on the heart, how changes occur during intimacy as we age, and safety during sexual activity.   Smoking Cessation / COPD -Discuss different methods to quit smoking, the health benefits of quitting smoking, and the definition of COPD.   Nutrition I: Fats -Discuss the types of cholesterol, what cholesterol does to the heart, and  how cholesterol levels can be controlled.   CARDIAC REHAB PHASE II EXERCISE from 11/03/2018 in [place]  Date  09/22/18  Educator  D. Coad  Instruction Review Code  2- Demonstrated Understanding      Nutrition II: Labels -Discuss the different components of food labels and how to read food label   [place] from 11/03/2018 in [place]  Date  09/29/18  Educator  Etheleen Mayhew  Instruction Review Code  2- Demonstrated Understanding      Heart Parts/Heart Disease and PAD -Discuss the anatomy of the heart, the pathway of blood circulation through the heart, and these are affected by heart disease.   CARDIAC REHAB PHASE II EXERCISE from 11/03/2018 in [place]  Date  10/06/18  Educator  Etheleen Mayhew  Instruction Review Code  2- Demonstrated Understanding      Stress I: Signs and Symptoms -Discuss the causes of stress, how stress may lead to anxiety and depression, and ways to limit stress.   CARDIAC REHAB PHASE II EXERCISE from 11/03/2018 in [employer]  Date  10/13/18  Educator  D.Coad  Instruction Review Code  2- Demonstrated Understanding      Stress II: Relaxation -Discuss different types of relaxation techniques to limit stress.   CARDIAC REHAB PHASE II EXERCISE from 11/03/2018 in [place]  Date  10/20/18  Educator  D. Coad  Instruction Review Code  2- Demonstrated Understanding      Warning Signs of Stroke / TIA -Discuss definition of a stroke, what the signs and symptoms are of a stroke, and how to identify when someone is having stroke.   CARDIAC REHAB PHASE II EXERCISE from 11/03/2018 in [place]  Date  10/27/18  Educator  Etheleen Mayhew  Instruction Review Code  2- Demonstrated Understanding      Knowledge Questionnaire Score: Knowledge Questionnaire Score - 09/15/18 1455      Knowledge Questionnaire Score   Pre Score  21/24       Core Components/Risk Factors/Patient Goals at Admission: Personal Goals and Risk Factors at Admission - 09/15/18 1455      Core Components/Risk Factors/Patient Goals on Admission    Weight Management  Weight Maintenance    Improve shortness of breath with ADL's  Yes    Expected Outcomes  Short Term: Improve  cardiorespiratory fitness to achieve a reduction of symptoms when performing ADLs;Long Term: Be able to perform more ADLs without symptoms or delay the onset of symptoms    Personal Goal Other  Yes    Personal Goal  Get well; gain strength in legs; stay well; improve SOB with activities.     Intervention  Patient will attend CR 3 days/week and supplement with exercise 2 days/week.     Expected Outcomes  Patient will meet his personal goals.        Core Components/Risk Factors/Patient Goals Review:  Goals and Risk Factor Review    Row Name 10/07/18 0808 11/04/18 0748           Core Components/Risk Factors/Patient Goals Review   Personal Goals Review  Weight Management/Obesity Get well; gian strength in legs; stay well; improve SOB with activities.   Weight Management/Obesity Get well; gain strength in legs; stay well; improve SOB with activity.       Review  Patient has completed 9 sessions losing 1 lb since his orientation visit. He is doing well in the  program with some progressions.  He says he does not feels much different than when he started but hopes to meet his goals as he continues. Will continue to monitor.   Patient has completed 21 sessions losing 2 lbs since his initial visit. He continues to do well in the program with progression. He says he does feel stronger in his legs and thinks his balance has improved. He says he is breathing somewhat better. He is pleased with his progress in the program. Will continue to monitor.       Expected Outcomes  Patient will continue to attend sessions and complete the program meeting his personal goals.   Patient will continue to attend sessions and complete the program meeting his personal goals.          Core Components/Risk Factors/Patient Goals at Discharge (Final Review):  Goals and Risk Factor Review - 11/04/18 0748      Core Components/Risk Factors/Patient Goals Review   Personal Goals Review  Weight Management/Obesity   Get well;  gain strength in legs; stay well; improve SOB with activity.    Review  Patient has completed 21 sessions losing 2 lbs since his initial visit. He continues to do well in the program with progression. He says he does feel stronger in his legs and thinks his balance has improved. He says he is breathing somewhat better. He is pleased with his progress in the program. Will continue to monitor.     Expected Outcomes  Patient will continue to attend sessions and complete the program meeting his personal goals.        ITP Comments: ITP Comments    Row Name 09/15/18 1435           ITP Comments  Patient is an 84 year old male referred by Dr. Irish Lack for s/p stent placement on 07/15/2018. He is HOH and is a fall risk. He has no barriers to CR.           Comments: ITP REVIEW Patient is doing well in the program. Will continue to monitor for progress.

## 2018-11-05 ENCOUNTER — Encounter (HOSPITAL_COMMUNITY)
Admission: RE | Admit: 2018-11-05 | Discharge: 2018-11-05 | Disposition: A | Discharge status: Home / Self Care | Payer: Medicare Other | Source: Ambulatory Visit | Attending: Cardiovascular Disease | Admitting: Cardiovascular Disease

## 2018-11-05 DIAGNOSIS — Z955 Presence of coronary angioplasty implant and graft: Secondary | ICD-10-CM | POA: Diagnosis not present

## 2018-11-05 NOTE — Progress Notes (Signed)
Daily Session Note  Patient Details  Name: TILAK OAKLEY MRN: [id number] Date of Birth: 19-May-1932 Referring Provider:     [place] from 09/15/2018 in [place]  Referring Provider  [name]      Encounter Date: 11/05/2018  Check In: Session Check In - 11/05/18 1100      Check-In   Supervising physician immediately available to respond to emergencies  See telemetry face sheet for immediately available MD    Location  [facility]    Staff Present  Aundra Dubin, RN, BSN;Diane Coad, MS, EP, [facility], Exercise Physiologist;Jahmire Ruffins Zachery Conch, Exercise Physiologist    Medication changes reported      No    Fall or balance concerns reported     Yes    Comments  Patient has h/o 3 falls in past 12 months. He exhibits an unsteady gait and says he gets dizzy at times.     Warm-up and Cool-down  Performed as group-led Higher education careers adviser Performed  Yes    VAD Patient?  No    PAD/SET Patient?  No      Pain Assessment   Currently in Pain?  No/denies    Pain Score  0-No pain    Multiple Pain Sites  No       Capillary Blood Glucose: No results found for this or any previous visit (from the past 24 hour(s)).    Social History   Tobacco Use  Smoking Status Former Smoker  . Packs/day: 1.00  . Years: 20.00  . Pack years: 20.00  . Types: Cigarettes  . Start date: 12/30/1943  Smokeless Tobacco Never Used  Tobacco Comment   "stopped smoking in 1970s when I got pneumonia"    Goals Met:  Independence with exercise equipment Exercise tolerated well No report of cardiac concerns or symptoms Strength training completed today  Goals Unmet:  Not Applicable  Comments: Pt able to follow exercise prescription today without complaint.  Will continue to monitor for progression. Check out 12:00.   Dr. Kate Sable is Medical Director for [facility] Cardiac and Pulmonary Rehab.

## 2018-11-08 ENCOUNTER — Encounter (HOSPITAL_COMMUNITY)
Admission: RE | Admit: 2018-11-08 | Discharge: 2018-11-08 | Disposition: A | Discharge status: Home / Self Care | Payer: Medicare Other | Source: Ambulatory Visit | Attending: Cardiovascular Disease | Admitting: Cardiovascular Disease

## 2018-11-08 DIAGNOSIS — Z955 Presence of coronary angioplasty implant and graft: Secondary | ICD-10-CM

## 2018-11-08 NOTE — Progress Notes (Signed)
Daily Session Note  Patient Details  Name: Jimmy Knox MRN: [id number] Date of Birth: December 02, 1932 Referring Provider:     [place] from 09/15/2018 in [place]  Referring Provider  [name]      Encounter Date: 11/08/2018  Check In: Session Check In - 11/08/18 1100      Check-In   Supervising physician immediately available to respond to emergencies  See telemetry face sheet for immediately available MD    Location  [facility]    Staff Present  Russella Dar, MS, EP, [facility], Exercise Physiologist;Debra Wynetta Emery, RN, BSN    Medication changes reported      No    Fall or balance concerns reported     Yes    Comments  Patient has h/o 3 falls in past 12 months. He exhibits an unsteady gait and says he gets dizzy at times.     Warm-up and Cool-down  Performed as group-led Higher education careers adviser Performed  Yes    VAD Patient?  No    PAD/SET Patient?  No      Pain Assessment   Currently in Pain?  No/denies    Pain Score  0-No pain    Multiple Pain Sites  No       Capillary Blood Glucose: No results found for this or any previous visit (from the past 24 hour(s)).    Social History   Tobacco Use  Smoking Status Former Smoker  . Packs/day: 1.00  . Years: 20.00  . Pack years: 20.00  . Types: Cigarettes  . Start date: 12/30/1943  Smokeless Tobacco Never Used  Tobacco Comment   "stopped smoking in 1970s when I got pneumonia"    Goals Met:  Independence with exercise equipment Exercise tolerated well Personal goals reviewed No report of cardiac concerns or symptoms Strength training completed today  Goals Unmet:  Not Applicable  Comments: Check out: 1200   Dr. Kate Sable is Medical Director for [place] and Pulmonary Rehab.

## 2018-11-10 ENCOUNTER — Encounter (HOSPITAL_COMMUNITY)
Admission: RE | Admit: 2018-11-10 | Discharge: 2018-11-10 | Disposition: A | Discharge status: Home / Self Care | Payer: Medicare Other | Source: Ambulatory Visit | Attending: Cardiovascular Disease | Admitting: Cardiovascular Disease

## 2018-11-10 DIAGNOSIS — Z955 Presence of coronary angioplasty implant and graft: Secondary | ICD-10-CM | POA: Diagnosis not present

## 2018-11-10 NOTE — Progress Notes (Signed)
Daily Session Note  Patient Details  Name: Jimmy Knox MRN: [id number] Date of Birth: May 05, 1932 Referring Provider:     [place] from 09/15/2018 in [place]  Referring Provider  [name]      Encounter Date: 11/10/2018  Check In: Session Check In - 11/10/18 1100      Check-In   Supervising physician immediately available to respond to emergencies  See telemetry face sheet for immediately available MD    Location  [facility]    Staff Present  Russella Dar, MS, EP, [facility], Exercise Physiologist;Lizmary Nader Wynetta Emery, RN, [facility], Exercise Physiologist    Medication changes reported      No    Fall or balance concerns reported     Yes    Comments  Patient has h/o 3 falls in past 12 months. He exhibits an unsteady gait and says he gets dizzy at times.     Warm-up and Cool-down  Performed as group-led Higher education careers adviser Performed  Yes    VAD Patient?  No    PAD/SET Patient?  No      Pain Assessment   Currently in Pain?  No/denies    Pain Score  0-No pain    Multiple Pain Sites  No       Capillary Blood Glucose: No results found for this or any previous visit (from the past 24 hour(s)).    Social History   Tobacco Use  Smoking Status Former Smoker  . Packs/day: 1.00  . Years: 20.00  . Pack years: 20.00  . Types: Cigarettes  . Start date: 12/30/1943  Smokeless Tobacco Never Used  Tobacco Comment   "stopped smoking in 1970s when I got pneumonia"    Goals Met:  Independence with exercise equipment Exercise tolerated well No report of cardiac concerns or symptoms Strength training completed today  Goals Unmet:  Not Applicable  Comments: Pt able to follow exercise prescription today without complaint.  Will continue to monitor for progression. Check out 1200.   Dr. Kate Sable is Medical Director for [facility] Cardiac and Pulmonary Rehab.

## 2018-11-12 ENCOUNTER — Encounter (HOSPITAL_COMMUNITY)
Admission: RE | Admit: 2018-11-12 | Discharge: 2018-11-12 | Disposition: A | Discharge status: Home / Self Care | Payer: Medicare Other | Source: Ambulatory Visit | Attending: Cardiovascular Disease | Admitting: Cardiovascular Disease

## 2018-11-12 DIAGNOSIS — Z955 Presence of coronary angioplasty implant and graft: Secondary | ICD-10-CM | POA: Diagnosis not present

## 2018-11-12 NOTE — Progress Notes (Signed)
Daily Session Note  Patient Details  Name: Jimmy Knox MRN: [id number] Date of Birth: [birth year] Referring Provider:     [place] from 09/15/2018 in [place]  Referring Provider  [name]      Encounter Date: 11/12/2018  Check In: Session Check In - 11/12/18 1100      Check-In   Supervising physician immediately available to respond to emergencies  See telemetry face sheet for immediately available MD    Location  [facility]    Staff Present  Russella Dar, MS, EP, [facility], Exercise Physiologist;Lashunda Greis Wynetta Emery, RN, [facility], Exercise Physiologist    Medication changes reported      No    Fall or balance concerns reported     Yes    Comments  Patient has h/o 3 falls in past 12 months. He exhibits an unsteady gait and says he gets dizzy at times.     Warm-up and Cool-down  Performed as group-led Higher education careers adviser Performed  Yes    VAD Patient?  No    PAD/SET Patient?  No      Pain Assessment   Currently in Pain?  No/denies    Pain Score  0-No pain    Multiple Pain Sites  No       Capillary Blood Glucose: No results found for this or any previous visit (from the past 24 hour(s)).    Social History   Tobacco Use  Smoking Status Former Smoker  . Packs/day: 1.00  . Years: 20.00  . Pack years: 20.00  . Types: Cigarettes  . Start date: 12/30/1943  Smokeless Tobacco Never Used  Tobacco Comment   "stopped smoking in 1970s when I got pneumonia"    Goals Met:  Independence with exercise equipment Exercise tolerated well No report of cardiac concerns or symptoms Strength training completed today  Goals Unmet:  Not Applicable  Comments: Pt able to follow exercise prescription today without complaint.  Will continue to monitor for progression. Check out 1200.   Dr. Kate Sable is Medical Director for [facility] Cardiac and Pulmonary Rehab.

## 2018-11-15 ENCOUNTER — Encounter (HOSPITAL_COMMUNITY)
Admission: RE | Admit: 2018-11-15 | Discharge: 2018-11-15 | Disposition: A | Discharge status: Home / Self Care | Payer: Medicare Other | Source: Ambulatory Visit | Attending: Cardiovascular Disease | Admitting: Cardiovascular Disease

## 2018-11-15 DIAGNOSIS — Z955 Presence of coronary angioplasty implant and graft: Secondary | ICD-10-CM

## 2018-11-15 NOTE — Progress Notes (Signed)
Daily Session Note  Patient Details  Name: Jimmy Knox MRN: [id number] Date of Birth: 01-Sep-1932 Referring Provider:     [name] from 09/15/2018 in [place]  Referring Provider  [name]      Encounter Date: 11/15/2018  Check In: Session Check In - 11/15/18 1100      Check-In   Supervising physician immediately available to respond to emergencies  See telemetry face sheet for immediately available MD    Location  [facility]    Staff Present  Russella Dar, MS, EP, [facility], Exercise Physiologist;Shalynn Jorstad Wynetta Emery, RN, [facility], Exercise Physiologist    Medication changes reported      No    Fall or balance concerns reported     Yes    Comments  Patient has h/o 3 falls in past 12 months. He exhibits an unsteady gait and says he gets dizzy at times.     Warm-up and Cool-down  Performed as group-led Higher education careers adviser Performed  Yes    VAD Patient?  No    PAD/SET Patient?  No      Pain Assessment   Currently in Pain?  No/denies    Pain Score  0-No pain    Multiple Pain Sites  No       Capillary Blood Glucose: No results found for this or any previous visit (from the past 24 hour(s)).    Social History   Tobacco Use  Smoking Status Former Smoker  . Packs/day: 1.00  . Years: 20.00  . Pack years: 20.00  . Types: Cigarettes  . Start date: 12/30/1943  Smokeless Tobacco Never Used  Tobacco Comment   "stopped smoking in 1970s when I got pneumonia"    Goals Met:  Independence with exercise equipment Exercise tolerated well No report of cardiac concerns or symptoms Strength training completed today  Goals Unmet:  Not Applicable  Comments: Pt able to follow exercise prescription today without complaint.  Will continue to monitor for progression. Check out 1200.   Dr. Kate Sable is Medical Director for [facility] Cardiac and Pulmonary Rehab.

## 2018-11-17 ENCOUNTER — Encounter (HOSPITAL_COMMUNITY): Payer: Medicare Other

## 2018-11-18 ENCOUNTER — Encounter (HOSPITAL_COMMUNITY): Payer: Medicare Other

## 2018-11-19 ENCOUNTER — Encounter (HOSPITAL_COMMUNITY)
Admission: RE | Admit: 2018-11-19 | Discharge: 2018-11-19 | Disposition: A | Discharge status: Home / Self Care | Payer: Medicare Other | Source: Ambulatory Visit | Attending: Cardiovascular Disease | Admitting: Cardiovascular Disease

## 2018-11-19 DIAGNOSIS — Z955 Presence of coronary angioplasty implant and graft: Secondary | ICD-10-CM | POA: Diagnosis not present

## 2018-11-19 NOTE — Progress Notes (Signed)
Daily Session Note  Patient Details  Name: ARIEL WINGROVE MRN: [id number] Date of Birth: 08-04-1932 Referring Provider:     [place] from 09/15/2018 in [place]  Referring Provider  [name]      Encounter Date: 11/19/2018  Check In: Session Check In - 11/19/18 1100      Check-In   Supervising physician immediately available to respond to emergencies  See telemetry face sheet for immediately available MD    Location  [facility]    Staff Present  Benay Pike, Exercise Physiologist;Debra Wynetta Emery, RN, BSN    Medication changes reported      No    Fall or balance concerns reported     Yes    Comments  Patient has h/o 3 falls in past 12 months. He exhibits an unsteady gait and says he gets dizzy at times.     Warm-up and Cool-down  Performed as group-led Higher education careers adviser Performed  Yes    VAD Patient?  No    PAD/SET Patient?  No      Pain Assessment   Currently in Pain?  No/denies    Pain Score  0-No pain    Multiple Pain Sites  No       Capillary Blood Glucose: No results found for this or any previous visit (from the past 24 hour(s)).    Social History   Tobacco Use  Smoking Status Former Smoker  . Packs/day: 1.00  . Years: 20.00  . Pack years: 20.00  . Types: Cigarettes  . Start date: 12/30/1943  Smokeless Tobacco Never Used  Tobacco Comment   "stopped smoking in 1970s when I got pneumonia"    Goals Met:  Independence with exercise equipment Exercise tolerated well No report of cardiac concerns or symptoms Strength training completed today  Goals Unmet:  Not Applicable  Comments: Pt able to follow exercise prescription today without complaint.  Will continue to monitor for progression. Check out 12:00.   Dr. Kate Sable is Medical Director for [facility] Cardiac and Pulmonary Rehab.

## 2018-11-22 ENCOUNTER — Encounter (HOSPITAL_COMMUNITY)
Admission: RE | Admit: 2018-11-22 | Discharge: 2018-11-22 | Disposition: A | Discharge status: Home / Self Care | Payer: Medicare Other | Source: Ambulatory Visit | Attending: Cardiovascular Disease | Admitting: Cardiovascular Disease

## 2018-11-22 DIAGNOSIS — Z955 Presence of coronary angioplasty implant and graft: Secondary | ICD-10-CM

## 2018-11-22 NOTE — Progress Notes (Signed)
Daily Session Note  Patient Details  Name: Jimmy Knox MRN: [id number] Date of Birth: Aug 05, 1932 Referring Provider:     [place] from 09/15/2018 in [place]  Referring Provider  [name]      Encounter Date: 11/22/2018  Check In: Session Check In - 11/22/18 1100      Check-In   Supervising physician immediately available to respond to emergencies  See telemetry face sheet for immediately available MD    Location  [facility]    Staff Present  Benay Pike, Exercise Physiologist;Prachi Oftedahl Wynetta Emery, RN, BSN    Medication changes reported      No    Fall or balance concerns reported     Yes    Comments  Patient has h/o 3 falls in past 12 months. He exhibits an unsteady gait and says he gets dizzy at times.     Warm-up and Cool-down  Performed as group-led Higher education careers adviser Performed  Yes    VAD Patient?  No    PAD/SET Patient?  No      Pain Assessment   Multiple Pain Sites  No       Capillary Blood Glucose: No results found for this or any previous visit (from the past 24 hour(s)).    Social History   Tobacco Use  Smoking Status Former Smoker  . Packs/day: 1.00  . Years: 20.00  . Pack years: 20.00  . Types: Cigarettes  . Start date: 12/30/1943  Smokeless Tobacco Never Used  Tobacco Comment   "stopped smoking in 1970s when I got pneumonia"    Goals Met:  Independence with exercise equipment Exercise tolerated well No report of cardiac concerns or symptoms Strength training completed today  Goals Unmet:  Not Applicable  Comments: Pt able to follow exercise prescription today without complaint.  Will continue to monitor for progression. Check out 1200.   Dr. Kate Sable is Medical Director for [facility] Cardiac and Pulmonary Rehab.

## 2018-11-24 ENCOUNTER — Encounter (HOSPITAL_COMMUNITY)
Admission: RE | Admit: 2018-11-24 | Discharge: 2018-11-24 | Disposition: A | Discharge status: Home / Self Care | Payer: Medicare Other | Source: Ambulatory Visit | Attending: Cardiovascular Disease | Admitting: Cardiovascular Disease

## 2018-11-24 DIAGNOSIS — Z955 Presence of coronary angioplasty implant and graft: Secondary | ICD-10-CM | POA: Diagnosis not present

## 2018-11-24 NOTE — Progress Notes (Signed)
Daily Session Note  Patient Details  Name: Jimmy Knox MRN: [id number] Date of Birth: October 29, 1932 Referring Provider:     [place] from 09/15/2018 in [place]  Referring Provider  [name]      Encounter Date: 11/24/2018  Check In: Session Check In - 11/24/18 1100      Check-In   Supervising physician immediately available to respond to emergencies  See telemetry face sheet for immediately available MD    Location  [facility]    Staff Present  Benay Pike, Exercise Physiologist;Alexie Lanni Wynetta Emery, RN, BSN    Medication changes reported      No    Fall or balance concerns reported     Yes    Comments  Patient has h/o 3 falls in past 12 months. He exhibits an unsteady gait and says he gets dizzy at times.     Warm-up and Cool-down  Performed as group-led Higher education careers adviser Performed  Yes    VAD Patient?  No    PAD/SET Patient?  No      Pain Assessment   Currently in Pain?  No/denies    Pain Score  0-No pain    Multiple Pain Sites  No       Capillary Blood Glucose: No results found for this or any previous visit (from the past 24 hour(s)).    Social History   Tobacco Use  Smoking Status Former Smoker  . Packs/day: 1.00  . Years: 20.00  . Pack years: 20.00  . Types: Cigarettes  . Start date: 12/30/1943  Smokeless Tobacco Never Used  Tobacco Comment   "stopped smoking in 1970s when I got pneumonia"    Goals Met:  Independence with exercise equipment Exercise tolerated well No report of cardiac concerns or symptoms Strength training completed today  Goals Unmet:  Not Applicable  Comments: Pt able to follow exercise prescription today without complaint.  Will continue to monitor for progression. Check out 1200.   Dr. Kate Sable is Medical Director for [facility] Cardiac and Pulmonary Rehab.

## 2018-11-26 ENCOUNTER — Encounter (HOSPITAL_COMMUNITY): Payer: Medicare Other

## 2018-11-29 ENCOUNTER — Encounter (HOSPITAL_COMMUNITY)
Admission: RE | Admit: 2018-11-29 | Discharge: 2018-11-29 | Disposition: A | Discharge status: Home / Self Care | Payer: Medicare Other | Source: Ambulatory Visit | Attending: Cardiovascular Disease | Admitting: Cardiovascular Disease

## 2018-11-29 DIAGNOSIS — Z955 Presence of coronary angioplasty implant and graft: Secondary | ICD-10-CM | POA: Insufficient documentation

## 2018-11-29 NOTE — Progress Notes (Signed)
Daily Session Note  Patient Details  Name: Jimmy Knox MRN: [id number] Date of Birth: 1932-01-05 Referring Provider:     [place] from 09/15/2018 in [place]  Referring Provider  [name]      Encounter Date: 11/29/2018  Check In: Session Check In - 11/29/18 1100      Check-In   Supervising physician immediately available to respond to emergencies  See telemetry face sheet for immediately available MD    Location  [facility]    Staff Present  Benay Pike, Exercise Physiologist;Shaunn Tackitt Wynetta Emery, RN, BSN    Medication changes reported      No    Fall or balance concerns reported     Yes    Comments  Patient has h/o 3 falls in past 12 months. He exhibits an unsteady gait and says he gets dizzy at times.     Warm-up and Cool-down  Performed as group-led Higher education careers adviser Performed  Yes    VAD Patient?  No    PAD/SET Patient?  No      Pain Assessment   Currently in Pain?  No/denies    Pain Score  0-No pain    Multiple Pain Sites  No       Capillary Blood Glucose: No results found for this or any previous visit (from the past 24 hour(s)).    Social History   Tobacco Use  Smoking Status Former Smoker  . Packs/day: 1.00  . Years: 20.00  . Pack years: 20.00  . Types: Cigarettes  . Start date: 12/30/1943  Smokeless Tobacco Never Used  Tobacco Comment   "stopped smoking in 1970s when I got pneumonia"    Goals Met:  Independence with exercise equipment Exercise tolerated well No report of cardiac concerns or symptoms Strength training completed today  Goals Unmet:  Not Applicable  Comments: Pt able to follow exercise prescription today without complaint.  Will continue to monitor for progression. Check out 1200.   Dr. Kate Sable is Medical Director for [facility] Cardiac and Pulmonary Rehab.

## 2018-11-29 NOTE — Progress Notes (Signed)
Cardiac Individual Treatment Plan  Patient Details  Name: Jimmy Knox MRN: [id number] Date of Birth: 01/19/1932 Referring Provider:     [place] from 09/15/2018 in [place]  Referring Provider  [name]      Initial Encounter Date:    CARDIAC REHAB PHASE II ORIENTATION from 09/15/2018 in [place]  Date  09/15/18      Visit Diagnosis: Status post coronary artery stent placement  Patient's Home Medications on Admission:  Current Outpatient Medications:  .  amitriptyline (ELAVIL) 50 MG tablet, Take 50 mg by mouth at bedtime., Disp: , Rfl:  .  aspirin EC 81 MG tablet, Take 81 mg by mouth daily., Disp: , Rfl:  .  clopidogrel (PLAVIX) 75 MG tablet, Take 1 tablet (75 mg total) by mouth daily., Disp: 90 tablet, Rfl: 3 .  losartan (COZAAR) 25 MG tablet, TAKE 1 TABLET BY MOUTH ONCE DAILY., Disp: 90 tablet, Rfl: 3 .  metoprolol succinate (TOPROL-XL) 25 MG 24 hr tablet, Take 25 mg by mouth daily., Disp: , Rfl:  .  nitroGLYCERIN (NITROSTAT) 0.4 MG SL tablet, Place 1 tablet (0.4 mg total) under the tongue every 5 (five) minutes as needed for chest pain., Disp: 25 tablet, Rfl: 2 .  simvastatin (ZOCOR) 40 MG tablet, Take 40 mg by mouth every evening. , Disp: , Rfl:   Past Medical History: Past Medical History:  Diagnosis Date  . Anxiety   . Arthritis    "right hip" (07/15/2018)  . Carotid artery disease ([place])    a. <50% bilaterally 05/2016.  Marland Kitchen Chronic combined systolic and diastolic CHF (congestive heart failure) ([place])   . Complication of anesthesia    "didn't wake up well after OR on 07/30/2016; took 2-3 to hold me down"  . Coronary artery disease    a. prior MI/stent around 2000. b. Nuc 09/2015 -> abnl, mgmd medically.  . Facial neuralgia   . History of blood transfusion 07/30/2016   "after the operation"  . Hyperlipidemia   . Hypertension   . Ischemic cardiomyopathy   . Melanoma of nose ([place])    "inside  on the right side"  . Myocardial infarction ([place]) ~ 2000  . Pneumonia 1935- 1970s ,"several times"  . [place] spotted fever 1942  . Skin cancer    "on my back; arms; froze them off"  . Type 2 diabetes, diet controlled ([place])     Tobacco Use: Social History   Tobacco Use  Smoking Status Former Smoker  . Packs/day: 1.00  . Years: 20.00  . Pack years: 20.00  . Types: Cigarettes  . Start date: 12/30/1943  Smokeless Tobacco Never Used  Tobacco Comment   "stopped smoking in 1970s when I got pneumonia"    Labs: Recent Review Flowsheet Data    Labs for ITP Cardiac and Pulmonary Rehab Latest Ref Rng & Units 06/23/2013 07/21/2016   Hemoglobin A1c 4.8 - 5.6 % - 6.1(H)   PHART 7.350 - 7.450 7.403 -   PCO2ART 35.0 - 45.0 mmHg 35.4 -   HCO3 20.0 - 24.0 mEq/L 21.6 -   TCO2 0 - 100 mmol/L 19.0 -   ACIDBASEDEF 0.0 - 2.0 mmol/L 2.4(H) -   O2SAT % 97.0 -      Capillary Blood Glucose: Lab Results  Component Value Date   GLUCAP 97 07/15/2018   GLUCAP 109 (H) 07/15/2018   GLUCAP 129 (H) 07/29/2016   GLUCAP 114 (H) 07/21/2016   GLUCAP 108 (H)  09/28/2015     Exercise Target Goals: Exercise Program Goal: Individual exercise prescription set using results from initial 6 min walk test and THRR while considering  patient's activity barriers and safety.   Exercise Prescription Goal: Starting with aerobic activity 30 plus minutes a day, 3 days per week for initial exercise prescription. Provide home exercise prescription and guidelines that participant acknowledges understanding prior to discharge.  Activity Barriers & Risk Stratification: Activity Barriers & Cardiac Risk Stratification - 09/15/18 1411      Activity Barriers & Cardiac Risk Stratification   Activity Barriers  Muscular Weakness;Shortness of Breath    Cardiac Risk Stratification  High       6 Minute Walk: 6 Minute Walk    Row Name 09/15/18 1409         6 Minute Walk   Phase  Initial     Distance  850 feet      Walk Time  6 minutes     # of Rest Breaks  0     MPH  1.61     METS  2.23     RPE  11     Perceived Dyspnea   9     VO2 Peak  4.93     Symptoms  No     Resting HR  75 bpm     Resting BP  92/50     Resting Oxygen Saturation   97 %     Exercise Oxygen Saturation  during 6 min walk  84 %     Max Ex. HR  112 bpm     Max Ex. BP  122/52     2 Minute Post BP  100/60        Oxygen Initial Assessment:   Oxygen Re-Evaluation:   Oxygen Discharge (Final Oxygen Re-Evaluation):   Initial Exercise Prescription: Initial Exercise Prescription - 09/15/18 1400      Date of Initial Exercise RX and Referring Provider   Date  09/15/18    Referring Provider  Koneswaran      NuStep   Level  1    SPM  47    Minutes  17    METs  1.8      Arm Ergometer   Level  1    Watts  14    RPM  61    Minutes  17    METs  2      Prescription Details   Frequency (times per week)  3    Duration  Progress to 30 minutes of continuous aerobic without signs/symptoms of physical distress      Intensity   THRR 40-80% of Max Heartrate  [phone]    Ratings of Perceived Exertion  11-13    Perceived Dyspnea  0-4      Progression   Progression  Continue to progress workloads to maintain intensity without signs/symptoms of physical distress.      Resistance Training   Training Prescription  Yes    Weight  1    Reps  10-15       Perform Capillary Blood Glucose checks as needed.  Exercise Prescription Changes:  Exercise Prescription Changes    Row Name 09/15/18 1400 09/28/18 1200 10/19/18 1100 11/18/18 1200       Response to Exercise   Blood Pressure (Admit)  -  122/64  110/64  112/70    Blood Pressure (Exercise)  -  130/64  124/60  108/60    Blood Pressure (Exit)  -  118/66  114/60  110/60    Heart Rate (Admit)  -  94 bpm  50 bpm  105 bpm    Heart Rate (Exercise)  -  109 bpm  114 bpm  114 bpm    Heart Rate (Exit)  -  82 bpm  97 bpm  106 bpm    Rating of Perceived Exertion (Exercise)   -  '11  11  11    '$ Comments  -  first full week of exercise   increased overall MET level   increased overall MET level     Duration  -  Progress to 30 minutes of  aerobic without signs/symptoms of physical distress  Progress to 30 minutes of  aerobic without signs/symptoms of physical distress  Progress to 30 minutes of  aerobic without signs/symptoms of physical distress    Intensity  -  THRR unchanged  THRR unchanged  THRR unchanged      Progression   Progression  -  Continue to progress workloads to maintain intensity without signs/symptoms of physical distress.  Continue to progress workloads to maintain intensity without signs/symptoms of physical distress.  Continue to progress workloads to maintain intensity without signs/symptoms of physical distress.    Average METs  -  2.3  2.4  3      Resistance Training   Training Prescription  -  Yes  Yes  Yes    Weight  -  '1  2  3    '$ Reps  -  10-15  10-15  -      NuStep   Level  -  '1  2  3    '$ SPM  -  100  110  103    Minutes  -  '22  22  22    '$ METs  -  2.3  2.1  3      Arm Ergometer   Level  -  '1  2  3    '$ Watts  -  '17  21  28    '$ RPM  -  63  63  56    Minutes  -  '17  17  17    '$ METs  -  2.3  2.7  3      Home Exercise Plan   Plans to continue exercise at  Home (comment) walking  Home (comment)  Home (comment)  Home (comment)    Frequency  Add 2 additional days to program exercise sessions.  Add 2 additional days to program exercise sessions.  Add 2 additional days to program exercise sessions.  Add 2 additional days to program exercise sessions.    Initial Home Exercises Provided  09/15/18  09/15/18  09/15/18  09/15/18       Exercise Comments:  Exercise Comments    Row Name 10/05/18 3295 11/03/18 1440 11/23/18 0802       Exercise Comments  Patient has tolerated exercise and progressions well. Will continue to monitor and progress as needed.   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 He is back to using a  tractor and moving limbs by himself.   Patient still reports being able to do more work outside without getting tired or SOB.         Exercise Goals and Review:  Exercise Goals    Row Name 09/15/18 1412             Exercise Goals   Increase Physical Activity  Yes  Intervention  Provide advice, education, support and counseling about physical activity/exercise needs.;Develop an individualized exercise prescription for aerobic and resistive training based on initial evaluation findings, risk stratification, comorbidities and participant's personal goals.       Expected Outcomes  Short Term: Attend rehab on a regular basis to increase amount of physical activity.;Long Term: Add in home exercise to make exercise part of routine and to increase amount of physical activity.;Long Term: Exercising regularly at least 3-5 days a week.       Increase Strength and Stamina  Yes       Intervention  Provide advice, education, support and counseling about physical activity/exercise needs.;Develop an individualized exercise prescription for aerobic and resistive training based on initial evaluation findings, risk stratification, comorbidities and participant's personal goals.       Expected Outcomes  Short Term: Increase workloads from initial exercise prescription for resistance, speed, and METs.;Short Term: Perform resistance training exercises routinely during rehab and add in resistance training at home;Long Term: Improve cardiorespiratory fitness, muscular endurance and strength as measured by increased METs and functional capacity (6MWT)       Able to understand and use rate of perceived exertion (RPE) scale  Yes       Intervention  Provide education and explanation on how to use RPE scale       Expected Outcomes  Short Term: Able to use RPE daily in rehab to express subjective intensity level;Long Term:  Able to use RPE to guide intensity level when exercising independently       Able to understand  and use Dyspnea scale  Yes       Intervention  Provide education and explanation on how to use Dyspnea scale       Expected Outcomes  Short Term: Able to use Dyspnea scale daily in rehab to express subjective sense of shortness of breath during exertion;Long Term: Able to use Dyspnea scale to guide intensity level when exercising independently       Knowledge and understanding of Target Heart Rate Range (THRR)  Yes       Intervention  Provide education and explanation of THRR including how the numbers were predicted and where they are located for reference       Expected Outcomes  Short Term: Able to state/look up THRR;Long Term: Able to use THRR to govern intensity when exercising independently;Short Term: Able to use daily as guideline for intensity in rehab       Able to check pulse independently  Yes       Intervention  Provide education and demonstration on how to check pulse in carotid and radial arteries.;Review the importance of being able to check your own pulse for safety during independent exercise       Expected Outcomes  Short Term: Able to explain why pulse checking is important during independent exercise;Long Term: Able to check pulse independently and accurately       Understanding of Exercise Prescription  Yes       Intervention  Provide education, explanation, and written materials on patient's individual exercise prescription       Expected Outcomes  Short Term: Able to explain program exercise prescription;Long Term: Able to explain home exercise prescription to exercise independently          Exercise Goals Re-Evaluation : Exercise Goals Re-Evaluation    [place] Name 10/05/18 0809 11/03/18 1438 11/23/18 0800         Exercise Goal Re-Evaluation   Exercise Goals Review  Increase Physical Activity;Able to understand and use rate of perceived exertion (RPE) scale;Knowledge and understanding of Target Heart Rate Range (THRR);Understanding of Exercise Prescription;Increase Strength  and Stamina;Able to understand and use Dyspnea scale;Able to check pulse independently  Increase Physical Activity;Able to understand and use rate of perceived exertion (RPE) scale;Knowledge and understanding of Target Heart Rate Range (THRR);Understanding of Exercise Prescription;Increase Strength and Stamina;Able to understand and use Dyspnea scale;Able to check pulse independently  Increase Physical Activity;Able to understand and use rate of perceived exertion (RPE) scale;Knowledge and understanding of Target Heart Rate Range (THRR);Understanding of Exercise Prescription;Increase Strength and Stamina;Able to understand and use Dyspnea scale;Able to check pulse independently     Comments  Patient has done well in the program so far. He has completed 8 sessions so far. He has tolerated exercise well and is always working hard to increase his activity level.   Patient continues to do well in the program. He attends regularly and always works hard to increase overall MET levels.   Patient has tolerated all progressions well. He is always willing to push himself to increase his overall MET level. He feels as if his legs are getting stronger.      Expected Outcomes  Gain strength and improve shortness of breath with activity.   Increase strength in legs and improve SOB with activity.   Increase strength in legs and improve SOB with activity.          Discharge Exercise Prescription (Final Exercise Prescription Changes): Exercise Prescription Changes - 11/18/18 1200      Response to Exercise   Blood Pressure (Admit)  112/70    Blood Pressure (Exercise)  108/60    Blood Pressure (Exit)  110/60    Heart Rate (Admit)  105 bpm    Heart Rate (Exercise)  114 bpm    Heart Rate (Exit)  106 bpm    Rating of Perceived Exertion (Exercise)  11    Comments  increased overall MET level     Duration  Progress to 30 minutes of  aerobic without signs/symptoms of physical distress    Intensity  THRR unchanged       Progression   Progression  Continue to progress workloads to maintain intensity without signs/symptoms of physical distress.    Average METs  3      Resistance Training   Training Prescription  Yes    Weight  3      NuStep   Level  3    SPM  103    Minutes  22    METs  3      Arm Ergometer   Level  3    Watts  28    RPM  56    Minutes  17    METs  3      Home Exercise Plan   Plans to continue exercise at  Home (comment)    Frequency  Add 2 additional days to program exercise sessions.    Initial Home Exercises Provided  09/15/18       Nutrition:  Target Goals: Understanding of nutrition guidelines, daily intake of sodium '1500mg'$ , cholesterol '200mg'$ , calories 30% from fat and 7% or less from saturated fats, daily to have 5 or more servings of fruits and vegetables.  Biometrics: Pre Biometrics - 09/15/18 1413      Pre Biometrics   Height  6' (1.829 m)    Weight  95.2 kg    Waist Circumference  35.5 inches  Hip Circumference  39.5 inches    Waist to Hip Ratio  0.9 %    BMI (Calculated)  28.46    Triceps Skinfold  5 mm    % Body Fat  21.4 %    Grip Strength  31.1 kg    Flexibility  0 in    Single Leg Stand  1.64 seconds        Nutrition Therapy Plan and Nutrition Goals: Nutrition Therapy & Goals - 11/29/18 1355      Nutrition Therapy   RD appointment deferred  Yes      Personal Nutrition Goals   Comments  Patient continues to say he is eating heart healthy. Will continue to montior for progress.        Nutrition Assessments: Nutrition Assessments - 09/15/18 1454      MEDFICTS Scores   Pre Score  24       Nutrition Goals Re-Evaluation:   Nutrition Goals Discharge (Final Nutrition Goals Re-Evaluation):   Psychosocial: Target Goals: Acknowledge presence or absence of significant depression and/or stress, maximize coping skills, provide positive support system. Participant is able to verbalize types and ability to use techniques and skills needed  for reducing stress and depression.  Initial Review & Psychosocial Screening: Initial Psych Review & Screening - 09/15/18 1450      Initial Review   Current issues with  None Identified      Family Dynamics   Good Support System?  Yes    Comments  Patient has good family support.       Barriers   Psychosocial barriers to participate in program  There are no identifiable barriers or psychosocial needs.;The patient should benefit from training in stress management and relaxation.      Screening Interventions   Interventions  Encouraged to exercise;Provide feedback about the scores to participant    Expected Outcomes  Long Term goal: The participant improves quality of Life and PHQ9 Scores as seen by post scores and/or verbalization of changes       Quality of Life Scores: Quality of Life - 09/15/18 1414      Quality of Life   Select  Quality of Life      Quality of Life Scores   Health/Function Pre  19.96 %    Socioeconomic Pre  24 %    Psych/Spiritual Pre  24.64 %    Family Pre  26.4 %    GLOBAL Pre  22.79 %      Scores of 19 and below usually indicate a poorer quality of life in these areas.  A difference of  2-3 points is a clinically meaningful difference.  A difference of 2-3 points in the total score of the Quality of Life Index has been associated with significant improvement in overall quality of life, self-image, physical symptoms, and general health in studies assessing change in quality of life.  PHQ-9: Recent Review Flowsheet Data    Depression screen [facility] 2/9 09/15/2018   Decreased Interest 0   Down, Depressed, Hopeless 0   PHQ - 2 Score 0   Altered sleeping 0   Tired, decreased energy 2   Change in appetite 0   Feeling bad or failure about yourself  0   Trouble concentrating 0   Moving slowly or fidgety/restless 1   Suicidal thoughts 0   PHQ-9 Score 3   Difficult doing work/chores Not difficult at all     Interpretation of Total Score  Total Score  Depression Severity:  1-4 = Minimal depression, 5-9 = Mild depression, 10-14 = Moderate depression, 15-19 = Moderately severe depression, 20-27 = Severe depression   Psychosocial Evaluation and Intervention: Psychosocial Evaluation - 09/15/18 1451      Psychosocial Evaluation & Interventions   Interventions  Encouraged to exercise with the program and follow exercise prescription;Stress management education;Relaxation education    Comments  Patient's initial QOL score was 22.79 and his PHQ-9 score was 3 with no psychosocial issues identified at orientation.    Expected Outcomes  Patient will have no psychosocial issues identified at discharge.     Continue Psychosocial Services   No Follow up required       Psychosocial Re-Evaluation: Psychosocial Re-Evaluation    [place] Name 10/07/18 0818 11/04/18 0751 11/29/18 1358         Psychosocial Re-Evaluation   Current issues with  None Identified  None Identified  None Identified     Comments  Patient's initial QOL score was 22.79 and his PHQ-9 score was 3 with no psychosocial issues identified.   Patient's initial QOL score was 22.79 and his PHQ-9 score was 3 with no psychosocial issues identified.   Patient's initial QOL score was 22.79 and his PHQ-9 score was 3 with no psychosocial issues identified.      Expected Outcomes  Patient will have no psychosocial issues identified at discharge.   Patient will have no psychosocial issues identified at discharge.   Patient will have no psychosocial issues identified at discharge.      Interventions  Stress management education;Encouraged to attend Cardiac Rehabilitation for the exercise;Relaxation education  Stress management education;Encouraged to attend Cardiac Rehabilitation for the exercise;Relaxation education  Stress management education;Encouraged to attend Cardiac Rehabilitation for the exercise;Relaxation education     Continue Psychosocial Services   No Follow up required  No Follow up required   No Follow up required        Psychosocial Discharge (Final Psychosocial Re-Evaluation): Psychosocial Re-Evaluation - 11/29/18 1358      Psychosocial Re-Evaluation   Current issues with  None Identified    Comments  Patient's initial QOL score was 22.79 and his PHQ-9 score was 3 with no psychosocial issues identified.     Expected Outcomes  Patient will have no psychosocial issues identified at discharge.     Interventions  Stress management education;Encouraged to attend Cardiac Rehabilitation for the exercise;Relaxation education    Continue Psychosocial Services   No Follow up required       Vocational Rehabilitation: Provide vocational rehab assistance to qualifying candidates.   Vocational Rehab Evaluation & Intervention: Vocational Rehab - 09/15/18 1455      Initial Vocational Rehab Evaluation & Intervention   Assessment shows need for Vocational Rehabilitation  No       Education: Education Goals: Education classes will be provided on a weekly basis, covering required topics. Participant will state understanding/return demonstration of topics presented.  Learning Barriers/Preferences: Learning Barriers/Preferences - 09/15/18 1454      Learning Barriers/Preferences   Learning Barriers  Hearing    Learning Preferences  Written Material       Education Topics: Hypertension, Hypertension Reduction -Define heart disease and high blood pressure. Discus how high blood pressure affects the body and ways to reduce high blood pressure.   CARDIAC REHAB PHASE II EXERCISE from 11/24/2018 in [place]  Date  11/03/18  Educator  D. Coad  Instruction Review Code  2- Demonstrated Understanding      Exercise and Your Heart -Discuss  why it is important to exercise, the FITT principles of exercise, normal and abnormal responses to exercise, and how to exercise safely.   CARDIAC REHAB PHASE II EXERCISE from 11/24/2018 in [place]   Date  11/10/18  Educator  Coad  Instruction Review Code  2- Demonstrated Understanding      Angina -Discuss definition of angina, causes of angina, treatment of angina, and how to decrease risk of having angina.   Cardiac Medications -Review what the following cardiac medications are used for, how they affect the body, and side effects that may occur when taking the medications.  Medications include Aspirin, Beta blockers, calcium channel blockers, ACE Inhibitors, angiotensin receptor blockers, diuretics, digoxin, and antihyperlipidemics.   CARDIAC REHAB PHASE II EXERCISE from 11/24/2018 in [place]  Date  11/24/18  Educator  Wynetta Emery  Instruction Review Code  2- Demonstrated Understanding      Congestive Heart Failure -Discuss the definition of CHF, how to live with CHF, the signs and symptoms of CHF, and how keep track of weight and sodium intake.   Heart Disease and Intimacy -Discus the effect sexual activity has on the heart, how changes occur during intimacy as we age, and safety during sexual activity.   Smoking Cessation / COPD -Discuss different methods to quit smoking, the health benefits of quitting smoking, and the definition of COPD.   Nutrition I: Fats -Discuss the types of cholesterol, what cholesterol does to the heart, and how cholesterol levels can be controlled.   CARDIAC REHAB PHASE II EXERCISE from 11/24/2018 in [place]  Date  09/22/18  Educator  D. Coad  Instruction Review Code  2- Demonstrated Understanding      Nutrition II: Labels -Discuss the different components of food labels and how to read food label   [place] from 11/24/2018 in [place]  Date  09/29/18  Educator  Etheleen Mayhew  Instruction Review Code  2- Demonstrated Understanding      Heart Parts/Heart Disease and PAD -Discuss the anatomy of the heart, the pathway of blood circulation  through the heart, and these are affected by heart disease.   CARDIAC REHAB PHASE II EXERCISE from 11/24/2018 in [place]  Date  10/06/18  Educator  Etheleen Mayhew  Instruction Review Code  2- Demonstrated Understanding      Stress I: Signs and Symptoms -Discuss the causes of stress, how stress may lead to anxiety and depression, and ways to limit stress.   CARDIAC REHAB PHASE II EXERCISE from 11/24/2018 in [place]  Date  10/13/18  Educator  D.Coad  Instruction Review Code  2- Demonstrated Understanding      Stress II: Relaxation -Discuss different types of relaxation techniques to limit stress.   CARDIAC REHAB PHASE II EXERCISE from 11/24/2018 in [place]  Date  10/20/18  Educator  D. Coad  Instruction Review Code  2- Demonstrated Understanding      Warning Signs of Stroke / TIA -Discuss definition of a stroke, what the signs and symptoms are of a stroke, and how to identify when someone is having stroke.   CARDIAC REHAB PHASE II EXERCISE from 11/24/2018 in [place]  Date  10/27/18  Educator  Etheleen Mayhew  Instruction Review Code  2- Demonstrated Understanding      Knowledge Questionnaire Score: Knowledge Questionnaire Score - 09/15/18 1455      Knowledge Questionnaire Score  Pre Score  21/24       Core Components/Risk Factors/Patient Goals at Admission: Personal Goals and Risk Factors at Admission - 09/15/18 1455      Core Components/Risk Factors/Patient Goals on Admission    Weight Management  Weight Maintenance    Improve shortness of breath with ADL's  Yes    Expected Outcomes  Short Term: Improve cardiorespiratory fitness to achieve a reduction of symptoms when performing ADLs;Long Term: Be able to perform more ADLs without symptoms or delay the onset of symptoms    Personal Goal Other  Yes    Personal Goal  Get well; gain strength in legs; stay well; improve SOB  with activities.     Intervention  Patient will attend CR 3 days/week and supplement with exercise 2 days/week.     Expected Outcomes  Patient will meet his personal goals.        Core Components/Risk Factors/Patient Goals Review:  Goals and Risk Factor Review    Row Name 10/07/18 0093 11/04/18 0748 11/29/18 1355         Core Components/Risk Factors/Patient Goals Review   Personal Goals Review  Weight Management/Obesity Get well; gian strength in legs; stay well; improve SOB with activities.   Weight Management/Obesity Get well; gain strength in legs; stay well; improve SOB with activity.   Weight Management/Obesity Get well; gain strength in legs; stay well; improve SOB with activities.      Review  Patient has completed 9 sessions losing 1 lb since his orientation visit. He is doing well in the program with some progressions.  He says he does not feels much different than when he started but hopes to meet his goals as he continues. Will continue to monitor.   Patient has completed 21 sessions losing 2 lbs since his initial visit. He continues to do well in the program with progression. He says he does feel stronger in his legs and thinks his balance has improved. He says he is breathing somewhat better. He is pleased with his progress in the program. Will continue to monitor.   Patient has completed 30 sessions losing 1 lb since last 30 day review. He continues to do well in the program with progression. He continues to say his legs are gaining strength with improved balance. He says his SOB is improving. He continues to be pleased with his progress in the program. Will continue to monitor for progress.      Expected Outcomes  Patient will continue to attend sessions and complete the program meeting his personal goals.   Patient will continue to attend sessions and complete the program meeting his personal goals.   Patient will continue to attend sessions and complete the program meeting his  personal goals.         Core Components/Risk Factors/Patient Goals at Discharge (Final Review):  Goals and Risk Factor Review - 11/29/18 1355      Core Components/Risk Factors/Patient Goals Review   Personal Goals Review  Weight Management/Obesity   Get well; gain strength in legs; stay well; improve SOB with activities.    Review  Patient has completed 30 sessions losing 1 lb since last 30 day review. He continues to do well in the program with progression. He continues to say his legs are gaining strength with improved balance. He says his SOB is improving. He continues to be pleased with his progress in the program. Will continue to monitor for progress.     Expected Outcomes  Patient  will continue to attend sessions and complete the program meeting his personal goals.        ITP Comments: ITP Comments    Row Name 09/15/18 1435           ITP Comments  Patient is an 84 year old male referred by Dr. Irish Lack for s/p stent placement on 07/15/2018. He is HOH and is a fall risk. He has no barriers to CR.           Comments: ITP REVIEW Patient doing well in the program. Will continue to monitor for progress.

## 2018-12-01 ENCOUNTER — Ambulatory Visit (INDEPENDENT_AMBULATORY_CARE_PROVIDER_SITE_OTHER): Payer: Medicare Other | Admitting: Cardiovascular Disease

## 2018-12-01 ENCOUNTER — Encounter (HOSPITAL_COMMUNITY)
Admission: RE | Admit: 2018-12-01 | Discharge: 2018-12-01 | Disposition: A | Discharge status: Home / Self Care | Payer: Medicare Other | Source: Ambulatory Visit | Attending: Cardiovascular Disease | Admitting: Cardiovascular Disease

## 2018-12-01 ENCOUNTER — Encounter: Payer: Self-pay | Admitting: Cardiovascular Disease

## 2018-12-01 VITALS — BP 120/74 | HR 90 | Ht 73.0 in | Wt 205.8 lb

## 2018-12-01 DIAGNOSIS — I255 Ischemic cardiomyopathy: Secondary | ICD-10-CM

## 2018-12-01 DIAGNOSIS — E785 Hyperlipidemia, unspecified: Secondary | ICD-10-CM | POA: Diagnosis not present

## 2018-12-01 DIAGNOSIS — Z955 Presence of coronary angioplasty implant and graft: Secondary | ICD-10-CM | POA: Diagnosis not present

## 2018-12-01 DIAGNOSIS — I739 Peripheral vascular disease, unspecified: Secondary | ICD-10-CM

## 2018-12-01 DIAGNOSIS — I10 Essential (primary) hypertension: Secondary | ICD-10-CM | POA: Diagnosis not present

## 2018-12-01 DIAGNOSIS — I25118 Atherosclerotic heart disease of native coronary artery with other forms of angina pectoris: Secondary | ICD-10-CM | POA: Diagnosis not present

## 2018-12-01 DIAGNOSIS — I779 Disorder of arteries and arterioles, unspecified: Secondary | ICD-10-CM

## 2018-12-01 NOTE — Progress Notes (Signed)
Daily Session Note  Patient Details  Name: Jimmy Knox MRN: [id number] Date of Birth: 10/31/1932 Referring Provider:     CARDIAC REHAB PHASE II ORIENTATION from 09/15/2018 in [facility]  Referring Provider  Koneswaran      Encounter Date: 12/01/2018  Check In: Session Check In - 12/01/18 1100      Check-In   Supervising physician immediately available to respond to emergencies  See telemetry face sheet for immediately available MD    Location  [facility]    Staff Present  Amanda Ballard, Exercise Physiologist;Debra Johnson, RN, BSN    Medication changes reported      No    Fall or balance concerns reported     Yes    Comments  Patient has h/o 3 falls in past 12 months. He exhibits an unsteady gait and says he gets dizzy at times.     Warm-up and Cool-down  Performed as group-led instruction    Resistance Training Performed  Yes    VAD Patient?  No    PAD/SET Patient?  No      Pain Assessment   Currently in Pain?  No/denies    Pain Score  0-No pain    Multiple Pain Sites  No       Capillary Blood Glucose: No results found for this or any previous visit (from the past 24 hour(s)).    Social History   Tobacco Use  Smoking Status Former Smoker  . Packs/day: 1.00  . Years: 20.00  . Pack years: 20.00  . Types: Cigarettes  . Start date: 12/30/1943  Smokeless Tobacco Never Used  Tobacco Comment   "stopped smoking in 1970s when I got pneumonia"    Goals Met:  Independence with exercise equipment Exercise tolerated well No report of cardiac concerns or symptoms Strength training completed today  Goals Unmet:  Not Applicable  Comments: Pt able to follow exercise prescription today without complaint.  Will continue to monitor for progression. Check out 1200.   Dr. Suresh Koneswaran is Medical Director for [facility]. 

## 2018-12-01 NOTE — Progress Notes (Signed)
SUBJECTIVE: The patient presents for routine follow-up.  He underwent CTO PCI of the mid RCA with a drug-eluting stent on 07/15/2018.  He underwent unsuccessful CTO PCI of the proximal left circumflex due to inability to cross with a wire.  It was recommended he have uninterrupted dual antiplatelet therapy with aspirin 81 mg daily and clopidogrel 75 mg daily for a minimum of 12 months.  The patient denies any symptoms of chest pain, palpitations, shortness of breath, lightheadedness, dizziness, leg swelling, orthopnea, PND, and syncope.  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  He is also participating in cardiac rehabilitation.  SocHx: Married for 59yearsinJune 2019. Has 2 children and 3 granddaughters who live fairly close to them.  One granddaughter is Materials engineer and math at [employer], another granddaughter is Scientist, product/process development at [employer], and other one is a Paramedic in [employer].  Review of Systems: As per "subjective", otherwise negative.  No Known Allergies  Current Outpatient Medications  Medication Sig Dispense Refill  . amitriptyline (ELAVIL) 50 MG tablet Take 50 mg by mouth at bedtime.    Marland Kitchen aspirin EC 81 MG tablet Take 81 mg by mouth daily.    . clopidogrel (PLAVIX) 75 MG tablet Take 1 tablet (75 mg total) by mouth daily. 90 tablet 3  . losartan (COZAAR) 25 MG tablet TAKE 1 TABLET BY MOUTH ONCE DAILY. 90 tablet 3  . metoprolol succinate (TOPROL-XL) 25 MG 24 hr tablet Take 25 mg by mouth daily.    . nitroGLYCERIN (NITROSTAT) 0.4 MG SL tablet Place 1 tablet (0.4 mg total) under the tongue every 5 (five) minutes as needed for chest pain. 25 tablet 2  . simvastatin (ZOCOR) 40 MG tablet Take 40 mg by mouth every evening.      No current facility-administered medications for this visit.     Past Medical History:  Diagnosis Date  . Anxiety   . Arthritis    "right hip" (07/15/2018)  . Carotid artery disease ([place])    a. <50% bilaterally 05/2016.  Marland Kitchen  Chronic combined systolic and diastolic CHF (congestive heart failure) ([place])   . Complication of anesthesia    "didn't wake up well after OR on 07/30/2016; took 2-3 to hold me down"  . Coronary artery disease    a. prior MI/stent around 2000. b. Nuc 09/2015 -> abnl, [facility] medically.  . Facial neuralgia   . History of blood transfusion 07/30/2016   "after the operation"  . Hyperlipidemia   . Hypertension   . Ischemic cardiomyopathy   . Melanoma of nose ([place])    "inside on the right side"  . Myocardial infarction ([place]) ~ 2000  . Pneumonia 1935- 1970s ,"several times"  . Rocky [place] spotted fever 1942  . Skin cancer    "on my back; arms; froze them off"  . Type 2 diabetes, diet controlled ([place])     Past Surgical History:  Procedure Laterality Date  . APPENDECTOMY    . BACK SURGERY    . CATARACT EXTRACTION, BILATERAL Bilateral   . COLONOSCOPY N/A 09/28/2015   Procedure: COLONOSCOPY;  Surgeon: Rogene Houston, MD;  [facility];  Service: Endoscopy;  Laterality: N/A;  955  . CORONARY ANGIOPLASTY WITH STENT PLACEMENT  2000  . CORONARY CTO INTERVENTION  07/15/2018  . CORONARY CTO INTERVENTION N/A 07/15/2018   Procedure: CORONARY CTO INTERVENTION;  Surgeon: Martinique, Peter M, MD;  Location: [facility];  Service: Cardiovascular;  Laterality: N/A;  .  CYSTOSCOPY W/ URETERAL STENT PLACEMENT    . EYE SURGERY Right 2013   "got hit by a limb & knocked my eye out"  . JOINT REPLACEMENT    . LEFT HEART CATH AND CORONARY ANGIOGRAPHY N/A 06/15/2018   Procedure: LEFT HEART CATH AND CORONARY ANGIOGRAPHY;  Surgeon: Martinique, Peter M, MD;  Location: [facility];  Service: Cardiovascular;  Laterality: N/A;  . LUMBAR [place]  . MELANOMA EXCISION Right    "inside my nose"  . TONSILLECTOMY    . TOTAL HIP ARTHROPLASTY Right 07/29/2016   Procedure: TOTAL HIP ARTHROPLASTY;  Surgeon: Garald Balding, MD;  Location: [facility];  Service: Orthopedics;  Laterality: Right;     Social History   Socioeconomic History  . Marital status: Married    Spouse name: Not on file  . Number of children: Not on file  . Years of education: Not on file  . Highest education level: Not on file  Occupational History  . Occupation: Retired  Scientific laboratory technician  . Financial resource strain: Not on file  . Food insecurity:    Worry: Not on file    Inability: Not on file  . Transportation needs:    Medical: Not on file    Non-medical: Not on file  Tobacco Use  . Smoking status: Former Smoker    Packs/day: 1.00    Years: 20.00    Pack years: 20.00    Types: Cigarettes    Start date: 12/30/1943  . Smokeless tobacco: Never Used  . Tobacco comment: "stopped smoking in 1970s when I got pneumonia"  Substance and Sexual Activity  . Alcohol use: Never    Alcohol/week: 0.0 standard drinks    Frequency: Never  . Drug use: Never  . Sexual activity: Not Currently  Lifestyle  . Physical activity:    Days per week: Not on file    Minutes per session: Not on file  . Stress: Not on file  Relationships  . Social connections:    Talks on phone: Not on file    Gets together: Not on file    Attends religious service: Not on file    Active member of club or organization: Not on file    Attends meetings of clubs or organizations: Not on file    Relationship status: Not on file  . Intimate partner violence:    Fear of current or ex partner: Not on file    Emotionally abused: Not on file    Physically abused: Not on file    Forced sexual activity: Not on file  Other Topics Concern  . Not on file  Social History Narrative  . Not on file     Vitals:   12/01/18 1048  BP: 120/74  Pulse: 90  SpO2: 97%  Weight: 205 lb 12.8 oz (93.4 kg)  Height: 6\' 1"  (1.854 m)    Wt Readings from Last 3 Encounters:  12/01/18 205 lb 12.8 oz (93.4 kg)  09/15/18 209 lb 14.1 oz (95.2 kg)  07/29/18 208 lb 3.2 oz (94.4 kg)     PHYSICAL EXAM General: NAD HEENT: Normal. Neck: No JVD, no  thyromegaly. Lungs:  No crackles or wheezes.   CV: Regular rate and rhythm, normal S1/S2, no S3/S4, 1/6 apical holosystolic murmur.   Abdomen: Soft, nontender, no distention.  Neurologic: Alert and oriented.  Psych: Normal affect. Skin: Normal. Musculoskeletal: No gross deformities.    ECG: Reviewed above under Subjective   Labs: Lab Results  Component Value Date/Time   K 3.8 07/16/2018 03:42 AM   BUN 9 07/16/2018 03:42 AM   CREATININE 1.09 07/16/2018 03:42 AM   ALT 23 07/21/2016 01:50 PM   HGB 12.8 (L) 07/16/2018 03:42 AM     Lipids: No results found for: LDLCALC, LDLDIRECT, CHOL, TRIG, HDL     ASSESSMENT AND PLAN:  1.  Coronary artery disease: Symptomatically stable.  Status post CTO PCI of the mid RCA on 07/15/2018.  Continue dual antiplatelet therapy with aspirin and comparable for a minimum of 12 months.  Continue losartan, Toprol-XL, and simvastatin.    2.  Hypertension: Controlled on present therapy.  No changes.  3.  Hyperlipidemia: Continue simvastatin 40 mg.  I will obtain a copy of lipids from PCP.  4. Carotid artery disease: Carotid Dopplers 06/20/16 less than 50% internal carotid artery stenosis bilaterally. Would repeat in 2+years.    Disposition: Follow up 6 months   Kate Sable, M.D., F.A.C.C.

## 2018-12-01 NOTE — Patient Instructions (Signed)
Medication Instructions:  Your physician recommends that you continue on your current medications as directed. Please refer to the Current Medication list given to you today.   Labwork: I WILL REQUEST A COPY OF LABS FROM PCP.   Testing/Procedures: NONE  Follow-Up: Your physician wants you to follow-up in: 6 MONTHS.  You will receive a reminder letter in the mail two months in advance. If you don't receive a letter, please call our office to schedule the follow-up appointment.   Any Other Special Instructions Will Be Listed Below (If Applicable).     If you need a refill on your cardiac medications before your next appointment, please call your pharmacy.   

## 2018-12-03 ENCOUNTER — Encounter (HOSPITAL_COMMUNITY)
Admission: RE | Admit: 2018-12-03 | Discharge: 2018-12-03 | Disposition: A | Discharge status: Home / Self Care | Payer: Medicare Other | Source: Ambulatory Visit | Attending: Cardiovascular Disease | Admitting: Cardiovascular Disease

## 2018-12-03 DIAGNOSIS — Z955 Presence of coronary angioplasty implant and graft: Secondary | ICD-10-CM

## 2018-12-03 NOTE — Progress Notes (Signed)
Daily Session Note  Patient Details  Name: MEAD SLANE MRN: [id number] Date of Birth: 1932/10/13 Referring Provider:     [place] from 09/15/2018 in [facility]  Referring Provider  [name]      Encounter Date: 12/03/2018  Check In: Session Check In - 12/03/18 1100      Check-In   Supervising physician immediately available to respond to emergencies  See telemetry face sheet for immediately available MD    Location  [facility]    Staff Present  Benay Pike, Exercise Physiologist;Alayssa Flinchum Wynetta Emery, RN, BSN    Medication changes reported      No    Fall or balance concerns reported     Yes    Comments  Patient has h/o 3 falls in past 12 months. He exhibits an unsteady gait and says he gets dizzy at times.     Warm-up and Cool-down  Performed as group-led Higher education careers adviser Performed  Yes    VAD Patient?  No    PAD/SET Patient?  No      Pain Assessment   Currently in Pain?  No/denies    Pain Score  0-No pain    Multiple Pain Sites  No       Capillary Blood Glucose: No results found for this or any previous visit (from the past 24 hour(s)).    Social History   Tobacco Use  Smoking Status Former Smoker  . Packs/day: 1.00  . Years: 20.00  . Pack years: 20.00  . Types: Cigarettes  . Start date: 12/30/1943  Smokeless Tobacco Never Used  Tobacco Comment   "stopped smoking in 1970s when I got pneumonia"    Goals Met:  Independence with exercise equipment Exercise tolerated well No report of cardiac concerns or symptoms Strength training completed today  Goals Unmet:  Not Applicable  Comments: Pt able to follow exercise prescription today without complaint.  Will continue to monitor for progression. Check out 1200.   Dr. Kate Sable is Medical Director for [facility] Re[facility].

## 2018-12-06 ENCOUNTER — Encounter (HOSPITAL_COMMUNITY)
Admission: RE | Admit: 2018-12-06 | Discharge: 2018-12-06 | Disposition: A | Discharge status: Home / Self Care | Payer: Medicare Other | Source: Ambulatory Visit | Attending: Cardiovascular Disease | Admitting: Cardiovascular Disease

## 2018-12-06 DIAGNOSIS — Z955 Presence of coronary angioplasty implant and graft: Secondary | ICD-10-CM

## 2018-12-06 NOTE — Progress Notes (Signed)
Daily Session Note  Patient Details  Name: Jimmy Knox MRN: [id number] Date of Birth: 05/02/1932 Referring Provider:     [place] from 09/15/2018 in [facility]  Referring Provider  [name]      Encounter Date: 12/06/2018  Check In: Session Check In - 12/06/18 1100      Check-In   Supervising physician immediately available to respond to emergencies  See telemetry face sheet for immediately available MD    Location  [facility]    Staff Present  Benay Pike, Exercise Physiologist;Saphyre Cillo Wynetta Emery, RN, BSN    Medication changes reported      No    Fall or balance concerns reported     Yes    Comments  Patient has h/o 3 falls in past 12 months. He exhibits an unsteady gait and says he gets dizzy at times.     Warm-up and Cool-down  Performed as group-led Higher education careers adviser Performed  Yes    VAD Patient?  No    PAD/SET Patient?  No      Pain Assessment   Currently in Pain?  No/denies    Pain Score  0-No pain    Multiple Pain Sites  No       Capillary Blood Glucose: No results found for this or any previous visit (from the past 24 hour(s)).    Social History   Tobacco Use  Smoking Status Former Smoker  . Packs/day: 1.00  . Years: 20.00  . Pack years: 20.00  . Types: Cigarettes  . Start date: 12/30/1943  Smokeless Tobacco Never Used  Tobacco Comment   "stopped smoking in 1970s when I got pneumonia"    Goals Met:  Independence with exercise equipment Exercise tolerated well No report of cardiac concerns or symptoms Strength training completed today  Goals Unmet:  Not Applicable  Comments: Pt able to follow exercise prescription today without complaint.  Will continue to monitor for progression. Check out 1200.   Dr. Kate Sable is Medical Director for [facility] Card[facility] and Pulmonary Rehab.

## 2018-12-08 ENCOUNTER — Encounter (HOSPITAL_COMMUNITY)
Admission: RE | Admit: 2018-12-08 | Discharge: 2018-12-08 | Disposition: A | Discharge status: Home / Self Care | Payer: Medicare Other | Source: Ambulatory Visit | Attending: Cardiovascular Disease | Admitting: Cardiovascular Disease

## 2018-12-08 DIAGNOSIS — Z955 Presence of coronary angioplasty implant and graft: Secondary | ICD-10-CM

## 2018-12-08 NOTE — Progress Notes (Signed)
Daily Session Note  Patient Details  Name: Jimmy Knox MRN: [id number] Date of Birth: 12-10-1932 Referring Provider:     [place] from 09/15/2018 in [facility]  Referring Provider  [name]      Encounter Date: 12/08/2018  Check In: Session Check In - 12/08/18 1100      Check-In   Supervising physician immediately available to respond to emergencies  See telemetry face sheet for immediately available MD    Location  [facility]    Staff Present  Benay Pike, Exercise Physiologist;Kerrie Timm Wynetta Emery, RN, BSN    Medication changes reported      No    Fall or balance concerns reported     Yes    Comments  Patient has h/o 3 falls in past 12 months. He exhibits an unsteady gait and says he gets dizzy at times.     Warm-up and Cool-down  Performed as group-led Higher education careers adviser Performed  Yes    VAD Patient?  No    PAD/SET Patient?  No      Pain Assessment   Currently in Pain?  No/denies    Pain Score  0-No pain    Multiple Pain Sites  No       Capillary Blood Glucose: No results found for this or any previous visit (from the past 24 hour(s)).    Social History   Tobacco Use  Smoking Status Former Smoker  . Packs/day: 1.00  . Years: 20.00  . Pack years: 20.00  . Types: Cigarettes  . Start date: 12/30/1943  Smokeless Tobacco Never Used  Tobacco Comment   "stopped smoking in 1970s when I got pneumonia"    Goals Met:  Independence with exercise equipment Exercise tolerated well No report of cardiac concerns or symptoms Strength training completed today  Goals Unmet:  Not Applicable  Comments: Pt able to follow exercise prescription today without complaint.  Will continue to monitor for progression. Check out 1200.   Dr. Kate Sable is Medical Director for [facility] Cardiac and Pulmonary Rehab.

## 2018-12-10 ENCOUNTER — Encounter (HOSPITAL_COMMUNITY)
Admission: RE | Admit: 2018-12-10 | Discharge: 2018-12-10 | Disposition: A | Discharge status: Home / Self Care | Payer: Medicare Other | Source: Ambulatory Visit | Attending: Cardiovascular Disease | Admitting: Cardiovascular Disease

## 2018-12-10 VITALS — Ht 73.0 in | Wt 209.9 lb

## 2018-12-10 DIAGNOSIS — Z955 Presence of coronary angioplasty implant and graft: Secondary | ICD-10-CM

## 2018-12-10 NOTE — Progress Notes (Signed)
Daily Session Note  Patient Details  Name: Jimmy Knox MRN: [id number] Date of Birth: 1932-11-28 Referring Provider:     [facility] from 09/15/2018 in [facility]  Referring Provider  [name]      Encounter Date: 12/10/2018  Check In: Session Check In - 12/10/18 1100      Check-In   Supervising physician immediately available to respond to emergencies  See telemetry face sheet for immediately available MD    Location  [facility]    Staff Present  Benay Pike, Exercise Physiologist;Debra Wynetta Emery, RN, BSN;Diane Coad, MS, EP, [facility], Exercise Physiologist    Medication changes reported      No    Fall or balance concerns reported     Yes    Comments  Patient has h/o 3 falls in past 12 months. He exhibits an unsteady gait and says he gets dizzy at times.     Warm-up and Cool-down  Performed as group-led Higher education careers adviser Performed  Yes    VAD Patient?  No    PAD/SET Patient?  No      Pain Assessment   Currently in Pain?  No/denies    Pain Score  0-No pain    Multiple Pain Sites  No       Capillary Blood Glucose: No results found for this or any previous visit (from the past 24 hour(s)).    Social History   Tobacco Use  Smoking Status Former Smoker  . Packs/day: 1.00  . Years: 20.00  . Pack years: 20.00  . Types: Cigarettes  . Start date: 12/30/1943  Smokeless Tobacco Never Used  Tobacco Comment   "stopped smoking in 1970s when I got pneumonia"    Goals Met:  Independence with exercise equipment Exercise tolerated well No report of cardiac concerns or symptoms Strength training completed today  Goals Unmet:  Not Applicable  Comments: Pt able to follow exercise prescription today without complaint.  Will continue to monitor for progression. Check out 1200.   Dr. Kate Sable is Medical Director for [facility] Cardiac and Pulmonary Rehab.

## 2018-12-13 ENCOUNTER — Encounter (HOSPITAL_COMMUNITY)
Admission: RE | Admit: 2018-12-13 | Discharge: 2018-12-13 | Disposition: A | Discharge status: Home / Self Care | Payer: Medicare Other | Source: Ambulatory Visit | Attending: Cardiovascular Disease | Admitting: Cardiovascular Disease

## 2018-12-13 DIAGNOSIS — Z955 Presence of coronary angioplasty implant and graft: Secondary | ICD-10-CM

## 2018-12-13 NOTE — Progress Notes (Signed)
Daily Session Note  Patient Details  Name: Jimmy Knox MRN: [id number] Date of Birth: 10/12/1932 Referring Provider:     CARDIAC REHAB PHASE II ORIENTATION from 09/15/2018 in [facility]  Referring Provider  Koneswaran      Encounter Date: 12/13/2018  Check In: Session Check In - 12/13/18 1100      Check-In   Supervising physician immediately available to respond to emergencies  See telemetry face sheet for immediately available MD    Location  [facility]    Staff Present  Amanda Ballard, Exercise Physiologist;Debra Johnson, RN, BSN;Diane Coad, MS, EP, [facility], Exercise Physiologist    Medication changes reported      No    Fall or balance concerns reported     Yes    Comments  Patient has h/o 3 falls in past 12 months. He exhibits an unsteady gait and says he gets dizzy at times.     Warm-up and Cool-down  Performed as group-led instruction    Resistance Training Performed  Yes    VAD Patient?  No    PAD/SET Patient?  No      Pain Assessment   Currently in Pain?  No/denies    Pain Score  0-No pain    Multiple Pain Sites  No       Capillary Blood Glucose: No results found for this or any previous visit (from the past 24 hour(s)).    Social History   Tobacco Use  Smoking Status Former Smoker  . Packs/day: 1.00  . Years: 20.00  . Pack years: 20.00  . Types: Cigarettes  . Start date: 12/30/1943  Smokeless Tobacco Never Used  Tobacco Comment   "stopped smoking in 1970s when I got pneumonia"    Goals Met:  Independence with exercise equipment Exercise tolerated well No report of cardiac concerns or symptoms Strength training completed today  Goals Unmet:  Not Applicable  Comments: Pt able to follow exercise prescription today without complaint.  Will continue to monitor for progression. Check out 1200.   Dr. Suresh Koneswaran is Medical Director for [facility]. 

## 2018-12-16 NOTE — Progress Notes (Signed)
Cardiac Individual Treatment Plan  Patient Details  Name: Jimmy Knox MRN: [id number] Date of Birth: 01/19/1932 Referring Provider:     [place] from 09/15/2018 in [place]  Referring Provider  [name]      Initial Encounter Date:    CARDIAC REHAB PHASE II ORIENTATION from 09/15/2018 in [place]  Date  09/15/18      Visit Diagnosis: Status post coronary artery stent placement  Patient's Home Medications on Admission:  Current Outpatient Medications:  .  amitriptyline (ELAVIL) 50 MG tablet, Take 50 mg by mouth at bedtime., Disp: , Rfl:  .  aspirin EC 81 MG tablet, Take 81 mg by mouth daily., Disp: , Rfl:  .  clopidogrel (PLAVIX) 75 MG tablet, Take 1 tablet (75 mg total) by mouth daily., Disp: 90 tablet, Rfl: 3 .  losartan (COZAAR) 25 MG tablet, TAKE 1 TABLET BY MOUTH ONCE DAILY., Disp: 90 tablet, Rfl: 3 .  metoprolol succinate (TOPROL-XL) 25 MG 24 hr tablet, Take 25 mg by mouth daily., Disp: , Rfl:  .  nitroGLYCERIN (NITROSTAT) 0.4 MG SL tablet, Place 1 tablet (0.4 mg total) under the tongue every 5 (five) minutes as needed for chest pain., Disp: 25 tablet, Rfl: 2 .  simvastatin (ZOCOR) 40 MG tablet, Take 40 mg by mouth every evening. , Disp: , Rfl:   Past Medical History: Past Medical History:  Diagnosis Date  . Anxiety   . Arthritis    "right hip" (07/15/2018)  . Carotid artery disease ([place])    a. <50% bilaterally 05/2016.  Marland Kitchen Chronic combined systolic and diastolic CHF (congestive heart failure) ([place])   . Complication of anesthesia    "didn't wake up well after OR on 07/30/2016; took 2-3 to hold me down"  . Coronary artery disease    a. prior MI/stent around 2000. b. Nuc 09/2015 -> abnl, mgmd medically.  . Facial neuralgia   . History of blood transfusion 07/30/2016   "after the operation"  . Hyperlipidemia   . Hypertension   . Ischemic cardiomyopathy   . Melanoma of nose ([place])    "inside  on the right side"  . Myocardial infarction ([place]) ~ 2000  . Pneumonia 1935- 1970s ,"several times"  . [place] spotted fever 1942  . Skin cancer    "on my back; arms; froze them off"  . Type 2 diabetes, diet controlled ([place])     Tobacco Use: Social History   Tobacco Use  Smoking Status Former Smoker  . Packs/day: 1.00  . Years: 20.00  . Pack years: 20.00  . Types: Cigarettes  . Start date: 12/30/1943  Smokeless Tobacco Never Used  Tobacco Comment   "stopped smoking in 1970s when I got pneumonia"    Labs: Recent Review Flowsheet Data    Labs for ITP Cardiac and Pulmonary Rehab Latest Ref Rng & Units 06/23/2013 07/21/2016   Hemoglobin A1c 4.8 - 5.6 % - 6.1(H)   PHART 7.350 - 7.450 7.403 -   PCO2ART 35.0 - 45.0 mmHg 35.4 -   HCO3 20.0 - 24.0 mEq/L 21.6 -   TCO2 0 - 100 mmol/L 19.0 -   ACIDBASEDEF 0.0 - 2.0 mmol/L 2.4(H) -   O2SAT % 97.0 -      Capillary Blood Glucose: Lab Results  Component Value Date   GLUCAP 97 07/15/2018   GLUCAP 109 (H) 07/15/2018   GLUCAP 129 (H) 07/29/2016   GLUCAP 114 (H) 07/21/2016   GLUCAP 108 (H)  09/28/2015     Exercise Target Goals: Exercise Program Goal: Individual exercise prescription set using results from initial 6 min walk test and THRR while considering  patient's activity barriers and safety.   Exercise Prescription Goal: Starting with aerobic activity 30 plus minutes a day, 3 days per week for initial exercise prescription. Provide home exercise prescription and guidelines that participant acknowledges understanding prior to discharge.  Activity Barriers & Risk Stratification: Activity Barriers & Cardiac Risk Stratification - 09/15/18 1411      Activity Barriers & Cardiac Risk Stratification   Activity Barriers  Muscular Weakness;Shortness of Breath    Cardiac Risk Stratification  High       6 Minute Walk: 6 Minute Walk    Row Name 09/15/18 1409 12/10/18 1141       6 Minute Walk   Phase  Initial  Discharge     Distance  850 feet  1000 feet    Distance % Change  -  17.6 %    Distance Feet Change  -  150 ft    Walk Time  6 minutes  6 minutes    # of Rest Breaks  0  0    MPH  1.61  1.89    METS  2.23  2.45    RPE  11  12    Perceived Dyspnea   9  14    VO2 Peak  4.93  5.9    Symptoms  No  Yes (comment)    Comments  -  SOB, subsided with rest.     Resting HR  75 bpm  86 bpm    Resting BP  92/50  130/66    Resting Oxygen Saturation   97 %  97 %    Exercise Oxygen Saturation  during 6 min walk  84 %  95 %    Max Ex. HR  112 bpm  102 bpm    Max Ex. BP  122/52  136/62    2 Minute Post BP  100/60  110/64       Oxygen Initial Assessment:   Oxygen Re-Evaluation:   Oxygen Discharge (Final Oxygen Re-Evaluation):   Initial Exercise Prescription: Initial Exercise Prescription - 09/15/18 1400      Date of Initial Exercise RX and Referring Provider   Date  09/15/18    Referring Provider  Koneswaran      NuStep   Level  1    SPM  47    Minutes  17    METs  1.8      Arm Ergometer   Level  1    Watts  14    RPM  61    Minutes  17    METs  2      Prescription Details   Frequency (times per week)  3    Duration  Progress to 30 minutes of continuous aerobic without signs/symptoms of physical distress      Intensity   THRR 40-80% of Max Heartrate  [phone]    Ratings of Perceived Exertion  11-13    Perceived Dyspnea  0-4      Progression   Progression  Continue to progress workloads to maintain intensity without signs/symptoms of physical distress.      Resistance Training   Training Prescription  Yes    Weight  1    Reps  10-15       Perform Capillary Blood Glucose checks as needed.  Exercise Prescription Changes:  Exercise  Prescription Changes    Row Name 09/15/18 1400 09/28/18 1200 10/19/18 1100 11/18/18 1200 12/01/18 1400     Response to Exercise   Blood Pressure (Admit)  -  122/64  110/64  112/70  110/60   Blood Pressure (Exercise)  -  130/64  124/60  108/60   130/60   Blood Pressure (Exit)  -  118/66  114/60  110/60  100/60   Heart Rate (Admit)  -  94 bpm  50 bpm  105 bpm  85 bpm   Heart Rate (Exercise)  -  109 bpm  114 bpm  114 bpm  109 bpm   Heart Rate (Exit)  -  82 bpm  97 bpm  106 bpm  90 bpm   Rating of Perceived Exertion (Exercise)  -  _0 Comments  -  first full week of exercise   increased overall MET level   increased overall MET level   -   Duration  -  Progress to 30 minutes of  aerobic without signs/symptoms of physical distress  Progress to 30 minutes of  aerobic without signs/symptoms of physical distress  Progress to 30 minutes of  aerobic without signs/symptoms of physical distress  Progress to 30 minutes of  aerobic without signs/symptoms of physical distress   Intensity  -  THRR unchanged  THRR unchanged  THRR unchanged  THRR unchanged     Progression   Progression  -  Continue to progress workloads to maintain intensity without signs/symptoms of physical distress.  Continue to progress workloads to maintain intensity without signs/symptoms of physical distress.  Continue to progress workloads to maintain intensity without signs/symptoms of physical distress.  Continue to progress workloads to maintain intensity without signs/symptoms of physical distress.   Average METs  -  2.3  2.4  3  3.25     Resistance Training   Training Prescription  -  Yes  Yes  Yes  Yes   Weight  -  _1 Reps  -  10-15  10-15  -  -     NuStep   Level  -  _2 SPM  -  100  110  103  106   Minutes  -  _3 METs  -  2.3  2.1  3  2.5     Arm Ergometer   Level  -  _4 3.5   Watts  -  _5 39   RPM  -  63  63  56  61   Minutes  -  _6 METs  -  2.3  2._7 Home Exercise Plan   Plans to continue exercise at  Home (comment) walking  Home (comment)  Home (comment)  Home (comment)  Home (comment)   Frequency  Add 2 additional days to program exercise sessions.  Add 2 additional days  to program exercise sessions.  Add 2 additional days to program exercise sessions.  Add 2 additional days to program exercise sessions.  Add 2 additional days to program exercise sessions.   Initial Home Exercises Provided  09/15/18  09/15/18  09/15/18  09/15/18  09/15/18      Exercise Comments:  Exercise Comments  Row Name 10/05/18 3664 11/03/18 1440 11/23/18 0802       Exercise Comments  Patient has tolerated exercise and progressions well. Will continue to monitor and progress as needed.   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 He is back to using a tractor and moving limbs by himself.   Patient still reports being able to do more work outside without getting tired or SOB.         Exercise Goals and Review:  Exercise Goals    Row Name 09/15/18 1412             Exercise Goals   Increase Physical Activity  Yes       Intervention  Provide advice, education, support and counseling about physical activity/exercise needs.;Develop an individualized exercise prescription for aerobic and resistive training based on initial evaluation findings, risk stratification, comorbidities and participant's personal goals.       Expected Outcomes  Short Term: Attend rehab on a regular basis to increase amount of physical activity.;Long Term: Add in home exercise to make exercise part of routine and to increase amount of physical activity.;Long Term: Exercising regularly at least 3-5 days a week.       Increase Strength and Stamina  Yes       Intervention  Provide advice, education, support and counseling about physical activity/exercise needs.;Develop an individualized exercise prescription for aerobic and resistive training based on initial evaluation findings, risk stratification, comorbidities and participant's personal goals.       Expected Outcomes  Short Term: Increase workloads from initial exercise prescription for resistance, speed, and METs.;Short Term: Perform  resistance training exercises routinely during rehab and add in resistance training at home;Long Term: Improve cardiorespiratory fitness, muscular endurance and strength as measured by increased METs and functional capacity (6MWT)       Able to understand and use rate of perceived exertion (RPE) scale  Yes       Intervention  Provide education and explanation on how to use RPE scale       Expected Outcomes  Short Term: Able to use RPE daily in rehab to express subjective intensity level;Long Term:  Able to use RPE to guide intensity level when exercising independently       Able to understand and use Dyspnea scale  Yes       Intervention  Provide education and explanation on how to use Dyspnea scale       Expected Outcomes  Short Term: Able to use Dyspnea scale daily in rehab to express subjective sense of shortness of breath during exertion;Long Term: Able to use Dyspnea scale to guide intensity level when exercising independently       Knowledge and understanding of Target Heart Rate Range (THRR)  Yes       Intervention  Provide education and explanation of THRR including how the numbers were predicted and where they are located for reference       Expected Outcomes  Short Term: Able to state/look up THRR;Long Term: Able to use THRR to govern intensity when exercising independently;Short Term: Able to use daily as guideline for intensity in rehab       Able to check pulse independently  Yes       Intervention  Provide education and demonstration on how to check pulse in carotid and radial arteries.;Review the importance of being able to check your own pulse for safety during independent exercise       Expected  Outcomes  Short Term: Able to explain why pulse checking is important during independent exercise;Long Term: Able to check pulse independently and accurately       Understanding of Exercise Prescription  Yes       Intervention  Provide education, explanation, and written materials on patient's  individual exercise prescription       Expected Outcomes  Short Term: Able to explain program exercise prescription;Long Term: Able to explain home exercise prescription to exercise independently          Exercise Goals Re-Evaluation : Exercise Goals Re-Evaluation    [place] Name 10/05/18 0809 11/03/18 1438 11/23/18 0800         Exercise Goal Re-Evaluation   Exercise Goals Review  Increase Physical Activity;Able to understand and use rate of perceived exertion (RPE) scale;Knowledge and understanding of Target Heart Rate Range (THRR);Understanding of Exercise Prescription;Increase Strength and Stamina;Able to understand and use Dyspnea scale;Able to check pulse independently  Increase Physical Activity;Able to understand and use rate of perceived exertion (RPE) scale;Knowledge and understanding of Target Heart Rate Range (THRR);Understanding of Exercise Prescription;Increase Strength and Stamina;Able to understand and use Dyspnea scale;Able to check pulse independently  Increase Physical Activity;Able to understand and use rate of perceived exertion (RPE) scale;Knowledge and understanding of Target Heart Rate Range (THRR);Understanding of Exercise Prescription;Increase Strength and Stamina;Able to understand and use Dyspnea scale;Able to check pulse independently     Comments  Patient has done well in the program so far. He has completed 8 sessions so far. He has tolerated exercise well and is always working hard to increase his activity level.   Patient continues to do well in the program. He attends regularly and always works hard to increase overall MET levels.   Patient has tolerated all progressions well. He is always willing to push himself to increase his overall MET level. He feels as if his legs are getting stronger.      Expected Outcomes  Gain strength and improve shortness of breath with activity.   Increase strength in legs and improve SOB with activity.   Increase strength in legs and improve  SOB with activity.          Discharge Exercise Prescription (Final Exercise Prescription Changes): Exercise Prescription Changes - 12/01/18 1400      Response to Exercise   Blood Pressure (Admit)  110/60    Blood Pressure (Exercise)  130/60    Blood Pressure (Exit)  100/60    Heart Rate (Admit)  85 bpm    Heart Rate (Exercise)  109 bpm    Heart Rate (Exit)  90 bpm    Rating of Perceived Exertion (Exercise)  11    Duration  Progress to 30 minutes of  aerobic without signs/symptoms of physical distress    Intensity  THRR unchanged      Progression   Progression  Continue to progress workloads to maintain intensity without signs/symptoms of physical distress.    Average METs  3.25      Resistance Training   Training Prescription  Yes    Weight  3      NuStep   Level  3    SPM  106    Minutes  22    METs  2.5      Arm Ergometer   Level  3.5    Watts  39    RPM  61    Minutes  17    METs  4  Home Exercise Plan   Plans to continue exercise at  Home (comment)    Frequency  Add 2 additional days to program exercise sessions.    Initial Home Exercises Provided  09/15/18       Nutrition:  Target Goals: Understanding of nutrition guidelines, daily intake of sodium <1519m, cholesterol <2071m calories 30% from fat and 7% or less from saturated fats, daily to have 5 or more servings of fruits and vegetables.  Biometrics: Pre Biometrics - 09/15/18 1413      Pre Biometrics   Height  6' (1.829 m)    Weight  95.2 kg    Waist Circumference  35.5 inches    Hip Circumference  39.5 inches    Waist to Hip Ratio  0.9 %    BMI (Calculated)  28.46    Triceps Skinfold  5 mm    % Body Fat  21.4 %    Grip Strength  31.1 kg    Flexibility  0 in    Single Leg Stand  1.64 seconds      Post Biometrics - 12/10/18 1143       Post  Biometrics   Height  _0  (1.854 m)    Weight  95.2 kg    Waist Circumference  36 inches    Hip Circumference  40 inches    Waist to Hip Ratio   0.9 %    BMI (Calculated)  27.7    Triceps Skinfold  5 mm    % Body Fat  21.7 %    Grip Strength  31.3 kg    Flexibility  0 in    Single Leg Stand  1.82 seconds       Nutrition Therapy Plan and Nutrition Goals: Nutrition Therapy & Goals - 11/29/18 1355      Nutrition Therapy   RD appointment deferred  Yes      Personal Nutrition Goals   Comments  Patient continues to say he is eating heart healthy. Will continue to montior for progress.        Nutrition Assessments: Nutrition Assessments - 12/16/18 1021      MEDFICTS Scores   Pre Score  24    Post Score  24    Score Difference  0       Nutrition Goals Re-Evaluation:   Nutrition Goals Discharge (Final Nutrition Goals Re-Evaluation):   Psychosocial: Target Goals: Acknowledge presence or absence of significant depression and/or stress, maximize coping skills, provide positive support system. Participant is able to verbalize types and ability to use techniques and skills needed for reducing stress and depression.  Initial Review & Psychosocial Screening: Initial Psych Review & Screening - 09/15/18 1450      Initial Review   Current issues with  None Identified      Family Dynamics   Good Support System?  Yes    Comments  Patient has good family support.       Barriers   Psychosocial barriers to participate in program  There are no identifiable barriers or psychosocial needs.;The patient should benefit from training in stress management and relaxation.      Screening Interventions   Interventions  Encouraged to exercise;Provide feedback about the scores to participant    Expected Outcomes  Long Term goal: The participant improves quality of Life and PHQ9 Scores as seen by post scores and/or verbalization of changes       Quality of Life Scores: Quality of Life - 12/13/18 1410  Quality of Life Scores   Health/Function Pre  19.96 %    Health/Function Post  19.6 %    Health/Function % Change  -1.8 %     Socioeconomic Pre  24 %    Socioeconomic Post  19.08 %    Socioeconomic % Change   -20.5 %    Psych/Spiritual Pre  24.64 %    Psych/Spiritual Post  23.64 %    Psych/Spiritual % Change  -4.06 %    Family Pre  26.4 %    Family Post  17.2 %    Family % Change  -34.85 %    GLOBAL Pre  22.79 %    GLOBAL Post  20 %    GLOBAL % Change  -12.24 %      Scores of 19 and below usually indicate a poorer quality of life in these areas.  A difference of  2-3 points is a clinically meaningful difference.  A difference of 2-3 points in the total score of the Quality of Life Index has been associated with significant improvement in overall quality of life, self-image, physical symptoms, and general health in studies assessing change in quality of life.  PHQ-9: Recent Review Flowsheet Data    Depression screen [facility] 2/9 12/16/2018 09/15/2018   Decreased Interest 0 0   Down, Depressed, Hopeless 0 0   PHQ - 2 Score 0 0   Altered sleeping 0 0   Tired, decreased energy 0 2   Change in appetite 0 0   Feeling bad or failure about yourself  0 0   Trouble concentrating 0 0   Moving slowly or fidgety/restless 0 1   Suicidal thoughts 0 0   PHQ-9 Score 0 3   Difficult doing work/chores Not difficult at all Not difficult at all     Interpretation of Total Score  Total Score Depression Severity:  1-4 = Minimal depression, 5-9 = Mild depression, 10-14 = Moderate depression, 15-19 = Moderately severe depression, 20-27 = Severe depression   Psychosocial Evaluation and Intervention: Psychosocial Evaluation - 12/16/18 1019      Discharge Psychosocial Assessment & Intervention   Comments  Patient has no psychosocial issues identified at discharge. His exit QOL score did decrease by 11.53% at 20% but his PHQ-9 score went from 3 to 0.       Psychosocial Re-Evaluation: Psychosocial Re-Evaluation    Row Name 10/07/18 0818 11/04/18 0751 11/29/18 1358         Psychosocial Re-Evaluation   Current issues with   None Identified  None Identified  None Identified     Comments  Patient's initial QOL score was 22.79 and his PHQ-9 score was 3 with no psychosocial issues identified.   Patient's initial QOL score was 22.79 and his PHQ-9 score was 3 with no psychosocial issues identified.   Patient's initial QOL score was 22.79 and his PHQ-9 score was 3 with no psychosocial issues identified.      Expected Outcomes  Patient will have no psychosocial issues identified at discharge.   Patient will have no psychosocial issues identified at discharge.   Patient will have no psychosocial issues identified at discharge.      Interventions  Stress management education;Encouraged to attend Cardiac Rehabilitation for the exercise;Relaxation education  Stress management education;Encouraged to attend Cardiac Rehabilitation for the exercise;Relaxation education  Stress management education;Encouraged to attend Cardiac Rehabilitation for the exercise;Relaxation education     Continue Psychosocial Services   No Follow up required  No Follow  up required  No Follow up required        Psychosocial Discharge (Final Psychosocial Re-Evaluation): Psychosocial Re-Evaluation - 11/29/18 1358      Psychosocial Re-Evaluation   Current issues with  None Identified    Comments  Patient's initial QOL score was 22.79 and his PHQ-9 score was 3 with no psychosocial issues identified.     Expected Outcomes  Patient will have no psychosocial issues identified at discharge.     Interventions  Stress management education;Encouraged to attend Cardiac Rehabilitation for the exercise;Relaxation education    Continue Psychosocial Services   No Follow up required       Vocational Rehabilitation: Provide vocational rehab assistance to qualifying candidates.   Vocational Rehab Evaluation & Intervention: Vocational Rehab - 09/15/18 1455      Initial Vocational Rehab Evaluation & Intervention   Assessment shows need for Vocational Rehabilitation   No       Education: Education Goals: Education classes will be provided on a weekly basis, covering required topics. Participant will state understanding/return demonstration of topics presented.  Learning Barriers/Preferences: Learning Barriers/Preferences - 09/15/18 1454      Learning Barriers/Preferences   Learning Barriers  Hearing    Learning Preferences  Written Material       Education Topics: Hypertension, Hypertension Reduction -Define heart disease and high blood pressure. Discus how high blood pressure affects the body and ways to reduce high blood pressure.   CARDIAC REHAB PHASE II EXERCISE from 12/08/2018 in [place]  Date  11/03/18  Educator  D. Coad  Instruction Review Code  2- Demonstrated Understanding      Exercise and Your Heart -Discuss why it is important to exercise, the FITT principles of exercise, normal and abnormal responses to exercise, and how to exercise safely.   CARDIAC REHAB PHASE II EXERCISE from 12/08/2018 in [place]  Date  11/10/18  Educator  Coad  Instruction Review Code  2- Demonstrated Understanding      Angina -Discuss definition of angina, causes of angina, treatment of angina, and how to decrease risk of having angina.   Cardiac Medications -Review what the following cardiac medications are used for, how they affect the body, and side effects that may occur when taking the medications.  Medications include Aspirin, Beta blockers, calcium channel blockers, ACE Inhibitors, angiotensin receptor blockers, diuretics, digoxin, and antihyperlipidemics.   CARDIAC REHAB PHASE II EXERCISE from 12/08/2018 in [place]  Date  11/24/18  Educator  Wynetta Emery  Instruction Review Code  2- Demonstrated Understanding      Congestive Heart Failure -Discuss the definition of CHF, how to live with CHF, the signs and symptoms of CHF, and how keep track of weight and sodium  intake.   CARDIAC REHAB PHASE II EXERCISE from 12/08/2018 in [place]  Date  12/01/18  Educator  Wynetta Emery  Instruction Review Code  2- Demonstrated Understanding      Heart Disease and Intimacy -Discus the effect sexual activity has on the heart, how changes occur during intimacy as we age, and safety during sexual activity.   CARDIAC REHAB PHASE II EXERCISE from 12/08/2018 in [place]  Date  12/08/18  Educator  Wynetta Emery  Instruction Review Code  2- Demonstrated Understanding      Smoking Cessation / COPD -Discuss different methods to quit smoking, the health benefits of quitting smoking, and the definition of COPD.   Nutrition I: Fats -Discuss the types of cholesterol,  what cholesterol does to the heart, and how cholesterol levels can be controlled.   CARDIAC REHAB PHASE II EXERCISE from 12/08/2018 in [place]  Date  09/22/18  Educator  D. Coad  Instruction Review Code  2- Demonstrated Understanding      Nutrition II: Labels -Discuss the different components of food labels and how to read food label   [place] from 12/08/2018 in [place]  Date  09/29/18  Educator  Etheleen Mayhew  Instruction Review Code  2- Demonstrated Understanding      Heart Parts/Heart Disease and PAD -Discuss the anatomy of the heart, the pathway of blood circulation through the heart, and these are affected by heart disease.   CARDIAC REHAB PHASE II EXERCISE from 12/08/2018 in [place]  Date  10/06/18  Educator  Etheleen Mayhew  Instruction Review Code  2- Demonstrated Understanding      Stress I: Signs and Symptoms -Discuss the causes of stress, how stress may lead to anxiety and depression, and ways to limit stress.   CARDIAC REHAB PHASE II EXERCISE from 12/08/2018 in [place]  Date  10/13/18  Educator  D.Coad  Instruction Review  Code  2- Demonstrated Understanding      Stress II: Relaxation -Discuss different types of relaxation techniques to limit stress.   CARDIAC REHAB PHASE II EXERCISE from 12/08/2018 in [place]  Date  10/20/18  Educator  D. Coad  Instruction Review Code  2- Demonstrated Understanding      Warning Signs of Stroke / TIA -Discuss definition of a stroke, what the signs and symptoms are of a stroke, and how to identify when someone is having stroke.   CARDIAC REHAB PHASE II EXERCISE from 12/08/2018 in [place]  Date  10/27/18  Educator  Etheleen Mayhew  Instruction Review Code  2- Demonstrated Understanding      Knowledge Questionnaire Score: Knowledge Questionnaire Score - 12/16/18 1020      Knowledge Questionnaire Score   Pre Score  21/24    Post Score  19/24       Core Components/Risk Factors/Patient Goals at Admission: Personal Goals and Risk Factors at Admission - 09/15/18 1455      Core Components/Risk Factors/Patient Goals on Admission    Weight Management  Weight Maintenance    Improve shortness of breath with ADL's  Yes    Expected Outcomes  Short Term: Improve cardiorespiratory fitness to achieve a reduction of symptoms when performing ADLs;Long Term: Be able to perform more ADLs without symptoms or delay the onset of symptoms    Personal Goal Other  Yes    Personal Goal  Get well; gain strength in legs; stay well; improve SOB with activities.     Intervention  Patient will attend CR 3 days/week and supplement with exercise 2 days/week.     Expected Outcomes  Patient will meet his personal goals.        Core Components/Risk Factors/Patient Goals Review:  Goals and Risk Factor Review    Row Name 10/07/18 2725 11/04/18 0748 11/29/18 1355 12/16/18 1016       Core Components/Risk Factors/Patient Goals Review   Personal Goals Review  Weight Management/Obesity Get well; gian strength in legs; stay well; improve SOB with  activities.   Weight Management/Obesity Get well; gain strength in legs; stay well; improve SOB with activity.   Weight Management/Obesity Get well; gain strength in legs; stay well;  improve SOB with activities.   Weight Management/Obesity Get well; gain strength in legs; stay well; improve SOB with activities.     Review  Patient has completed 9 sessions losing 1 lb since his orientation visit. He is doing well in the program with some progressions.  He says he does not feels much different than when he started but hopes to meet his goals as he continues. Will continue to monitor.   Patient has completed 21 sessions losing 2 lbs since his initial visit. He continues to do well in the program with progression. He says he does feel stronger in his legs and thinks his balance has improved. He says he is breathing somewhat better. He is pleased with his progress in the program. Will continue to monitor.   Patient has completed 30 sessions losing 1 lb since last 30 day review. He continues to do well in the program with progression. He continues to say his legs are gaining strength with improved balance. He says his SOB is improving. He continues to be pleased with his progress in the program. Will continue to monitor for progress.   Patient graduated with 36 sessions gaining 2 lbs overall. He did well in the program. His exit walk test improved by 17.6% and his exit measurements improved in balance. He says he has more energy and did gain strenght in his legs and overall. He is doing all the activities he wants to do without difficulty and with improved SOB. He plans to continues exercising at the senior center. CR will f/u for one year.     Expected Outcomes  Patient will continue to attend sessions and complete the program meeting his personal goals.   Patient will continue to attend sessions and complete the program meeting his personal goals.   Patient will continue to attend sessions and complete the program  meeting his personal goals.   Patient will continue exercising at the senior center and continue to meet his personal goals.        Core Components/Risk Factors/Patient Goals at Discharge (Final Review):  Goals and Risk Factor Review - 12/16/18 1016      Core Components/Risk Factors/Patient Goals Review   Personal Goals Review  Weight Management/Obesity   Get well; gain strength in legs; stay well; improve SOB with activities.    Review  Patient graduated with 36 sessions gaining 2 lbs overall. He did well in the program. His exit walk test improved by 17.6% and his exit measurements improved in balance. He says he has more energy and did gain strenght in his legs and overall. He is doing all the activities he wants to do without difficulty and with improved SOB. He plans to continues exercising at the senior center. CR will f/u for one year.     Expected Outcomes  Patient will continue exercising at the senior center and continue to meet his personal goals.        ITP Comments: ITP Comments    Row Name 09/15/18 1435           ITP Comments  Patient is an 84 year old male referred by Dr. Irish Lack for s/p stent placement on 07/15/2018. He is HOH and is a fall risk. He has no barriers to CR.           Comments: Patient graduated from [place] today on 12/13/18 after completing 36 sessions. He achieved LTG of 30 minutes of aerobic exercise at Max Met level of 3.5. All  patients vitals are WNL. Patient has met with dietician. Discharge instruction has been reviewed in detail and patient stated an understanding of material given. Patient plans to continue exercising at the senior center. Cardiac Rehab staff will make f/u calls at 1 month, 6 months, and 1 year. Patient had no complaints of any abnormal S/S or pain on their exit visit.

## 2018-12-16 NOTE — Progress Notes (Signed)
Discharge Progress Report  Patient Details  Name: Jimmy Knox MRN: [id number] Date of Birth: Oct 21, 1932 Referring Provider:     [place] from 09/15/2018 in [place]  Referring Provider  [place]       Number of Visits: 67  Reason for Discharge:  Patient reached a stable level of exercise. Patient independent in their exercise. Patient has met program and personal goals.  Smoking History:  Social History   Tobacco Use  Smoking Status Former Smoker  . Packs/day: 1.00  . Years: 20.00  . Pack years: 20.00  . Types: Cigarettes  . Start date: 12/30/1943  Smokeless Tobacco Never Used  Tobacco Comment   "stopped smoking in 1970s when I got pneumonia"    Diagnosis:  Status post coronary artery stent placement  ADL [facility]SD:   Initial Exercise Prescription: Initial Exercise Prescription - 09/15/18 1400      Date of Initial Exercise RX and Referring Provider   Date  09/15/18    Referring Provider  Koneswaran      NuStep   Level  1    SPM  47    Minutes  17    METs  1.8      Arm Ergometer   Level  1    Watts  14    RPM  61    Minutes  17    METs  2      Prescription Details   Frequency (times per week)  3    Duration  Progress to 30 minutes of continuous aerobic without signs/symptoms of physical distress      Intensity   THRR 40-80% of Max Heartrate  [phone]    Ratings of Perceived Exertion  11-13    Perceived Dyspnea  0-4      Progression   Progression  Continue to progress workloads to maintain intensity without signs/symptoms of physical distress.      Resistance Training   Training Prescription  Yes    Weight  1    Reps  10-15       Discharge Exercise Prescription (Final Exercise Prescription Changes): Exercise Prescription Changes - 12/01/18 1400      Response to Exercise   Blood Pressure (Admit)  110/60    Blood Pressure (Exercise)  130/60    Blood Pressure (Exit)  100/60    Heart  Rate (Admit)  85 bpm    Heart Rate (Exercise)  109 bpm    Heart Rate (Exit)  90 bpm    Rating of Perceived Exertion (Exercise)  11    Duration  Progress to 30 minutes of  aerobic without signs/symptoms of physical distress    Intensity  THRR unchanged      Progression   Progression  Continue to progress workloads to maintain intensity without signs/symptoms of physical distress.    Average METs  3.25      Resistance Training   Training Prescription  Yes    Weight  3      NuStep   Level  3    SPM  106    Minutes  22    METs  2.5      Arm Ergometer   Level  3.5    Watts  39    RPM  61    Minutes  17    METs  4      Home Exercise Plan   Plans to continue exercise at  Home (comment)  Frequency  Add 2 additional days to program exercise sessions.    Initial Home Exercises Provided  09/15/18       Functional Capacity: 6 Minute Walk    Row Name 09/15/18 1409 12/10/18 1141       6 Minute Walk   Phase  Initial  Discharge    Distance  850 feet  1000 feet    Distance % Change  -  17.6 %    Distance Feet Change  -  150 ft    Walk Time  6 minutes  6 minutes    # of Rest Breaks  0  0    MPH  1.61  1.89    METS  2.23  2.45    RPE  11  12    Perceived Dyspnea   9  14    VO2 Peak  4.93  5.9    Symptoms  No  Yes (comment)    Comments  -  SOB, subsided with rest.     Resting HR  75 bpm  86 bpm    Resting BP  92/50  130/66    Resting Oxygen Saturation   97 %  97 %    Exercise Oxygen Saturation  during 6 min walk  84 %  95 %    Max Ex. HR  112 bpm  102 bpm    Max Ex. BP  122/52  136/62    2 Minute Post BP  100/60  110/64       Psychological, QOL, Others - Outcomes: PHQ 2/9: Depression screen [facility] 2/9 12/16/2018 09/15/2018  Decreased Interest 0 0  Down, Depressed, Hopeless 0 0  PHQ - 2 Score 0 0  Altered sleeping 0 0  Tired, decreased energy 0 2  Change in appetite 0 0  Feeling bad or failure about yourself  0 0  Trouble concentrating 0 0  Moving slowly or  fidgety/restless 0 1  Suicidal thoughts 0 0  PHQ-9 Score 0 3  Difficult doing work/chores Not difficult at all Not difficult at all    Quality of Life: Quality of Life - 12/13/18 1410      Quality of Life Scores   Health/Function Pre  19.96 %    Health/Function Post  19.6 %    Health/Function % Change  -1.8 %    Socioeconomic Pre  24 %    Socioeconomic Post  19.08 %    Socioeconomic % Change   -20.5 %    Psych/Spiritual Pre  24.64 %    Psych/Spiritual Post  23.64 %    Psych/Spiritual % Change  -4.06 %    Family Pre  26.4 %    Family Post  17.2 %    Family % Change  -34.85 %    GLOBAL Pre  22.79 %    GLOBAL Post  20 %    GLOBAL % Change  -12.24 %       Personal Goals: Goals established at orientation with interventions provided to work toward goal. Personal Goals and Risk Factors at Admission - 09/15/18 1455      Core Components/Risk Factors/Patient Goals on Admission    Weight Management  Weight Maintenance    Improve shortness of breath with ADL's  Yes    Expected Outcomes  Short Term: Improve cardiorespiratory fitness to achieve a reduction of symptoms when performing ADLs;Long Term: Be able to perform more ADLs without symptoms or delay the onset of symptoms    Personal Goal  Other  Yes    Personal Goal  Get well; gain strength in legs; stay well; improve SOB with activities.     Intervention  Patient will attend CR 3 days/week and supplement with exercise 2 days/week.     Expected Outcomes  Patient will meet his personal goals.         Personal Goals Discharge: Goals and Risk Factor Review    Row Name 10/07/18 6834 11/04/18 0748 11/29/18 1355 12/16/18 1016       Core Components/Risk Factors/Patient Goals Review   Personal Goals Review  Weight Management/Obesity Get well; gian strength in legs; stay well; improve SOB with activities.   Weight Management/Obesity Get well; gain strength in legs; stay well; improve SOB with activity.   Weight Management/Obesity Get  well; gain strength in legs; stay well; improve SOB with activities.   Weight Management/Obesity Get well; gain strength in legs; stay well; improve SOB with activities.     Review  Patient has completed 9 sessions losing 1 lb since his orientation visit. He is doing well in the program with some progressions.  He says he does not feels much different than when he started but hopes to meet his goals as he continues. Will continue to monitor.   Patient has completed 21 sessions losing 2 lbs since his initial visit. He continues to do well in the program with progression. He says he does feel stronger in his legs and thinks his balance has improved. He says he is breathing somewhat better. He is pleased with his progress in the program. Will continue to monitor.   Patient has completed 30 sessions losing 1 lb since last 30 day review. He continues to do well in the program with progression. He continues to say his legs are gaining strength with improved balance. He says his SOB is improving. He continues to be pleased with his progress in the program. Will continue to monitor for progress.   Patient graduated with 36 sessions gaining 2 lbs overall. He did well in the program. His exit walk test improved by 17.6% and his exit measurements improved in balance. He says he has more energy and did gain strenght in his legs and overall. He is doing all the activities he wants to do without difficulty and with improved SOB. He plans to continues exercising at the senior center. CR will f/u for one year.     Expected Outcomes  Patient will continue to attend sessions and complete the program meeting his personal goals.   Patient will continue to attend sessions and complete the program meeting his personal goals.   Patient will continue to attend sessions and complete the program meeting his personal goals.   Patient will continue exercising at the senior center and continue to meet his personal goals.        Exercise  Goals and Review: Exercise Goals    Row Name 09/15/18 1412             Exercise Goals   Increase Physical Activity  Yes       Intervention  Provide advice, education, support and counseling about physical activity/exercise needs.;Develop an individualized exercise prescription for aerobic and resistive training based on initial evaluation findings, risk stratification, comorbidities and participant's personal goals.       Expected Outcomes  Short Term: Attend rehab on a regular basis to increase amount of physical activity.;Long Term: Add in home exercise to make exercise part of routine and to increase amount  of physical activity.;Long Term: Exercising regularly at least 3-5 days a week.       Increase Strength and Stamina  Yes       Intervention  Provide advice, education, support and counseling about physical activity/exercise needs.;Develop an individualized exercise prescription for aerobic and resistive training based on initial evaluation findings, risk stratification, comorbidities and participant's personal goals.       Expected Outcomes  Short Term: Increase workloads from initial exercise prescription for resistance, speed, and METs.;Short Term: Perform resistance training exercises routinely during rehab and add in resistance training at home;Long Term: Improve cardiorespiratory fitness, muscular endurance and strength as measured by increased METs and functional capacity (6MWT)       Able to understand and use rate of perceived exertion (RPE) scale  Yes       Intervention  Provide education and explanation on how to use RPE scale       Expected Outcomes  Short Term: Able to use RPE daily in rehab to express subjective intensity level;Long Term:  Able to use RPE to guide intensity level when exercising independently       Able to understand and use Dyspnea scale  Yes       Intervention  Provide education and explanation on how to use Dyspnea scale       Expected Outcomes  Short Term:  Able to use Dyspnea scale daily in rehab to express subjective sense of shortness of breath during exertion;Long Term: Able to use Dyspnea scale to guide intensity level when exercising independently       Knowledge and understanding of Target Heart Rate Range (THRR)  Yes       Intervention  Provide education and explanation of THRR including how the numbers were predicted and where they are located for reference       Expected Outcomes  Short Term: Able to state/look up THRR;Long Term: Able to use THRR to govern intensity when exercising independently;Short Term: Able to use daily as guideline for intensity in rehab       Able to check pulse independently  Yes       Intervention  Provide education and demonstration on how to check pulse in carotid and radial arteries.;Review the importance of being able to check your own pulse for safety during independent exercise       Expected Outcomes  Short Term: Able to explain why pulse checking is important during independent exercise;Long Term: Able to check pulse independently and accurately       Understanding of Exercise Prescription  Yes       Intervention  Provide education, explanation, and written materials on patient's individual exercise prescription       Expected Outcomes  Short Term: Able to explain program exercise prescription;Long Term: Able to explain home exercise prescription to exercise independently          Exercise Goals Re-Evaluation: Exercise Goals Re-Evaluation    [place] Name 10/05/18 0809 11/03/18 1438 11/23/18 0800         Exercise Goal Re-Evaluation   Exercise Goals Review  Increase Physical Activity;Able to understand and use rate of perceived exertion (RPE) scale;Knowledge and understanding of Target Heart Rate Range (THRR);Understanding of Exercise Prescription;Increase Strength and Stamina;Able to understand and use Dyspnea scale;Able to check pulse independently  Increase Physical Activity;Able to understand and use rate of  perceived exertion (RPE) scale;Knowledge and understanding of Target Heart Rate Range (THRR);Understanding of Exercise Prescription;Increase Strength and Stamina;Able to understand and use Dyspnea  scale;Able to check pulse independently  Increase Physical Activity;Able to understand and use rate of perceived exertion (RPE) scale;Knowledge and understanding of Target Heart Rate Range (THRR);Understanding of Exercise Prescription;Increase Strength and Stamina;Able to understand and use Dyspnea scale;Able to check pulse independently     Comments  Patient has done well in the program so far. He has completed 8 sessions so far. He has tolerated exercise well and is always working hard to increase his activity level.   Patient continues to do well in the program. He attends regularly and always works hard to increase overall MET levels.   Patient has tolerated all progressions well. He is always willing to push himself to increase his overall MET level. He feels as if his legs are getting stronger.      Expected Outcomes  Gain strength and improve shortness of breath with activity.   Increase strength in legs and improve SOB with activity.   Increase strength in legs and improve SOB with activity.         Nutrition & Weight - Outcomes: Pre Biometrics - 09/15/18 1413      Pre Biometrics   Height  6' (1.829 m)    Weight  95.2 kg    Waist Circumference  35.5 inches    Hip Circumference  39.5 inches    Waist to Hip Ratio  0.9 %    BMI (Calculated)  28.46    Triceps Skinfold  5 mm    % Body Fat  21.4 %    Grip Strength  31.1 kg    Flexibility  0 in    Single Leg Stand  1.64 seconds      Post Biometrics - 12/10/18 1143       Post  Biometrics   Height  '6\' 1"'  (1.854 m)    Weight  95.2 kg    Waist Circumference  36 inches    Hip Circumference  40 inches    Waist to Hip Ratio  0.9 %    BMI (Calculated)  27.7    Triceps Skinfold  5 mm    % Body Fat  21.7 %    Grip Strength  31.3 kg    Flexibility   0 in    Single Leg Stand  1.82 seconds       Nutrition: Nutrition Therapy & Goals - 11/29/18 1355      Nutrition Therapy   RD appointment deferred  Yes      Personal Nutrition Goals   Comments  Patient continues to say he is eating heart healthy. Will continue to montior for progress.        Nutrition Discharge: Nutrition Assessments - 12/16/18 1021      MEDFICTS Scores   Pre Score  24    Post Score  24    Score Difference  0       Education Questionnaire Score: Knowledge Questionnaire Score - 12/16/18 1020      Knowledge Questionnaire Score   Pre Score  21/24    Post Score  19/24       Goals reviewed with patient; copy given to patient.

## 2019-01-06 DIAGNOSIS — H1811 Bullous keratopathy, right eye: Secondary | ICD-10-CM | POA: Diagnosis not present

## 2019-01-06 DIAGNOSIS — H47231 Glaucomatous optic atrophy, right eye: Secondary | ICD-10-CM | POA: Diagnosis not present

## 2019-01-19 ENCOUNTER — Ambulatory Visit (INDEPENDENT_AMBULATORY_CARE_PROVIDER_SITE_OTHER): Payer: Medicare Other | Admitting: Orthopaedic Surgery

## 2019-01-19 ENCOUNTER — Encounter (INDEPENDENT_AMBULATORY_CARE_PROVIDER_SITE_OTHER): Payer: Self-pay | Admitting: Orthopaedic Surgery

## 2019-01-19 VITALS — BP 101/62 | HR 92 | Ht 73.0 in | Wt 209.0 lb

## 2019-01-19 DIAGNOSIS — M170 Bilateral primary osteoarthritis of knee: Secondary | ICD-10-CM | POA: Diagnosis not present

## 2019-01-19 MED ORDER — BUPIVACAINE HCL 0.5 % IJ SOLN
2.0000 mL | INTRAMUSCULAR | Status: AC | PRN
  Administered 2019-01-19: 2 mL via INTRA_ARTICULAR

## 2019-01-19 MED ORDER — METHYLPREDNISOLONE ACETATE 40 MG/ML IJ SUSP
80.0000 mg | INTRAMUSCULAR | Status: AC | PRN
  Administered 2019-01-19: 80 mg

## 2019-01-19 MED ORDER — LIDOCAINE HCL 1 % IJ SOLN
2.0000 mL | INTRAMUSCULAR | Status: AC | PRN
  Administered 2019-01-19: 2 mL

## 2019-01-19 NOTE — Progress Notes (Signed)
Office Visit Note   Patient: Jimmy Knox           Date of Birth: [birth year]           MRN: [id number] Visit Date: 01/19/2019              Requested by: Asencion Noble, MD [address] [place], [place] [address] PCP: Asencion Noble, MD   Assessment & Plan: Visit Diagnoses:  1. Bilateral primary osteoarthritis of knee     Plan: Osteoarthritis left knee will inject with cortisone.  Doing well in terms of his right hip replacement  Follow-Up Instructions: Return if symptoms worsen or fail to improve.   Orders:  No orders of the defined types were placed in this encounter.  No orders of the defined types were placed in this encounter.     Procedures: Large Joint Inj: L knee on 01/19/2019 1:19 PM Indications: pain and diagnostic evaluation Details: 25 G 1.5 in needle, anteromedial approach  Arthrogram: No  Medications: 2 mL lidocaine 1 %; 2 mL bupivacaine 0.5 %; 80 mg methylPREDNISolone acetate 40 MG/ML Procedure, treatment alternatives, risks and benefits explained, specific risks discussed. Consent was given by the patient. Patient was prepped and draped in the usual sterile fashion.       Clinical Data: No additional findings.   Subjective: Chief Complaint  Patient presents with  . Left Knee - Pain  Patient returns with left knee pain since 12/30/2018. He does not know of any injury. He did just complete 12 weeks of cardiac rehab to strengthen legs and improve balance. He is taking aleve and using heat for the pain. He had a previous left knee injection on 01/27/2018.  HPI  Review of Systems   Objective: Vital Signs: BP 101/62   Pulse 92   Ht 6\' 1"  (1.854 m)   Wt 209 lb (94.8 kg)   BMI 27.57 kg/m   Physical Exam Constitutional:      Appearance: He is well-developed.  Eyes:     Pupils: Pupils are equal, round, and reactive to light.  Pulmonary:     Effort: Pulmonary effort is normal.  Skin:    General: Skin is warm and dry.  Neurological:   Mental Status: He is alert and oriented to person, place, and time.  Psychiatric:        Behavior: Behavior normal.     Ortho Exam awake alert and oriented x3.  Comfortable sitting.  No pain with range of motion of his right total hip replacement.  Very mild edema both legs.  Predominately medial joint pain left knee with very minimal effusion.  Lacks just a few degrees to full extension.  Walks without ambulatory aid.   Specialty Comments:  No specialty comments available.  Imaging: No results found.   PMFS History: Patient Active Problem List   Diagnosis Date Noted  . Bilateral primary osteoarthritis of knee 01/19/2019  . Angina pectoris ([place]) 07/15/2018  . Dyspnea on exertion 06/15/2018  . Primary osteoarthritis of right hip 07/29/2016  . Hypotension 07/29/2016  . Chronic combined systolic and diastolic CHF (congestive heart failure) ([place]) 07/29/2016  . S/P total hip arthroplasty 07/29/2016  . Post-operative state   . Postprocedural hypotension   . Cardiomyopathy, ischemic 10/12/2013  . Carotid artery stenosis 10/12/2013  . Cerebrovascular disease 02/14/2013  . Arteriosclerotic cardiovascular disease (ASCVD)   . Essential hypertension   . Hyperlipidemia   . Fasting hyperglycemia    Past Medical History:  Diagnosis Date  .  Anxiety   . Arthritis    "right hip" (07/15/2018)  . Carotid artery disease ([place])    a. <50% bilaterally 05/2016.  Marland Kitchen Chronic combined systolic and diastolic CHF (congestive heart failure) ([place])   . Complication of anesthesia    "didn't wake up well after OR on 07/30/2016; took 2-3 to hold me down"  . Coronary artery disease    a. prior MI/stent around 2000. b. Nuc 09/2015 -> abnl, mgmd medically.  . Facial neuralgia   . History of blood transfusion 07/30/2016   "after the operation"  . Hyperlipidemia   . Hypertension   . Ischemic cardiomyopathy   . Melanoma of nose ([place])    "inside on the right side"  . Myocardial infarction ([place]) ~ 2000  .  Pneumonia 1935- 1970s ,"several times"  . Rocky Mountain spotted fever 1942  . Skin cancer    "on my back; arms; froze them off"  . Type 2 diabetes, diet controlled ([place])     Family History  Problem Relation Age of Onset  . Diabetes Mellitus II Brother        x2 brothers  . Alzheimer's disease Mother     Past Surgical History:  Procedure Laterality Date  . APPENDECTOMY    . BACK SURGERY    . CATARACT EXTRACTION, BILATERAL Bilateral   . COLONOSCOPY N/A 09/28/2015   Procedure: COLONOSCOPY;  Surgeon: Rogene Houston, MD;  [facility];  Service: Endoscopy;  Laterality: N/A;  955  . CORONARY ANGIOPLASTY WITH STENT PLACEMENT  2000  . CORONARY CTO INTERVENTION  07/15/2018  . CORONARY CTO INTERVENTION N/A 07/15/2018   Procedure: CORONARY CTO INTERVENTION;  Surgeon: Martinique, Eyanna Mcgonagle M, MD;  Location: [facility];  Service: Cardiovascular;  Laterality: N/A;  . CYSTOSCOPY W/ URETERAL STENT PLACEMENT    . EYE SURGERY Right 2013   "got hit by a limb & knocked my eye out"  . JOINT REPLACEMENT    . LEFT HEART CATH AND CORONARY ANGIOGRAPHY N/A 06/15/2018   Procedure: LEFT HEART CATH AND CORONARY ANGIOGRAPHY;  Surgeon: Martinique, Angell Honse M, MD;  Location: [facility];  Service: Cardiovascular;  Laterality: N/A;  . LUMBAR [place]  . MELANOMA EXCISION Right    "inside my nose"  . TONSILLECTOMY    . TOTAL HIP ARTHROPLASTY Right 07/29/2016   Procedure: TOTAL HIP ARTHROPLASTY;  Surgeon: Garald Balding, MD;  Location: [facility];  Service: Orthopedics;  Laterality: Right;   Social History   Occupational History  . Occupation: Retired  Tobacco Use  . Smoking status: Former Smoker    Packs/day: 1.00    Years: 20.00    Pack years: 20.00    Types: Cigarettes    Start date: 12/30/1943  . Smokeless tobacco: Never Used  . Tobacco comment: "stopped smoking in 1970s when I got pneumonia"  Substance and Sexual Activity  . Alcohol use: Never    Alcohol/week: 0.0 standard drinks      Frequency: Never  . Drug use: Never  . Sexual activity: Not Currently

## 2019-01-26 ENCOUNTER — Emergency Department (HOSPITAL_COMMUNITY): Payer: Medicare Other

## 2019-01-26 ENCOUNTER — Encounter (HOSPITAL_COMMUNITY): Payer: Self-pay | Admitting: Emergency Medicine

## 2019-01-26 ENCOUNTER — Other Ambulatory Visit: Payer: Self-pay

## 2019-01-26 ENCOUNTER — Emergency Department (HOSPITAL_COMMUNITY)
Admission: EM | Admit: 2019-01-26 | Discharge: 2019-01-26 | Disposition: A | Discharge status: Home / Self Care | Payer: Medicare Other | Attending: Emergency Medicine | Admitting: Emergency Medicine

## 2019-01-26 DIAGNOSIS — W228XXA Striking against or struck by other objects, initial encounter: Secondary | ICD-10-CM | POA: Diagnosis not present

## 2019-01-26 DIAGNOSIS — Z7982 Long term (current) use of aspirin: Secondary | ICD-10-CM | POA: Diagnosis not present

## 2019-01-26 DIAGNOSIS — Y9389 Activity, other specified: Secondary | ICD-10-CM | POA: Diagnosis not present

## 2019-01-26 DIAGNOSIS — S0101XA Laceration without foreign body of scalp, initial encounter: Secondary | ICD-10-CM | POA: Insufficient documentation

## 2019-01-26 DIAGNOSIS — E119 Type 2 diabetes mellitus without complications: Secondary | ICD-10-CM | POA: Diagnosis not present

## 2019-01-26 DIAGNOSIS — Z23 Encounter for immunization: Secondary | ICD-10-CM | POA: Diagnosis not present

## 2019-01-26 DIAGNOSIS — Y9289 Other specified places as the place of occurrence of the external cause: Secondary | ICD-10-CM | POA: Insufficient documentation

## 2019-01-26 DIAGNOSIS — Y999 Unspecified external cause status: Secondary | ICD-10-CM | POA: Insufficient documentation

## 2019-01-26 DIAGNOSIS — I2510 Atherosclerotic heart disease of native coronary artery without angina pectoris: Secondary | ICD-10-CM | POA: Diagnosis not present

## 2019-01-26 DIAGNOSIS — S0990XA Unspecified injury of head, initial encounter: Secondary | ICD-10-CM | POA: Diagnosis not present

## 2019-01-26 DIAGNOSIS — I5042 Chronic combined systolic (congestive) and diastolic (congestive) heart failure: Secondary | ICD-10-CM | POA: Insufficient documentation

## 2019-01-26 DIAGNOSIS — Z7902 Long term (current) use of antithrombotics/antiplatelets: Secondary | ICD-10-CM | POA: Insufficient documentation

## 2019-01-26 DIAGNOSIS — Z79899 Other long term (current) drug therapy: Secondary | ICD-10-CM | POA: Diagnosis not present

## 2019-01-26 DIAGNOSIS — Z87891 Personal history of nicotine dependence: Secondary | ICD-10-CM | POA: Diagnosis not present

## 2019-01-26 DIAGNOSIS — I110 Hypertensive heart disease with heart failure: Secondary | ICD-10-CM | POA: Insufficient documentation

## 2019-01-26 MED ORDER — LIDOCAINE-EPINEPHRINE-TETRACAINE (LET) SOLUTION
3.0000 mL | Freq: Once | NASAL | Status: AC
  Administered 2019-01-26: 3 mL via TOPICAL
  Filled 2019-01-26: qty 3

## 2019-01-26 MED ORDER — LIDOCAINE HCL (PF) 2 % IJ SOLN
10.0000 mL | Freq: Once | INTRAMUSCULAR | Status: AC
  Administered 2019-01-26: 10 mL

## 2019-01-26 MED ORDER — LIDOCAINE HCL (PF) 2 % IJ SOLN
INTRAMUSCULAR | Status: AC
  Filled 2019-01-26: qty 10

## 2019-01-26 MED ORDER — TETANUS-DIPHTH-ACELL PERTUSSIS 5-2.5-18.5 LF-MCG/0.5 IM SUSP
0.5000 mL | Freq: Once | INTRAMUSCULAR | Status: AC
  Administered 2019-01-26: 0.5 mL via INTRAMUSCULAR
  Filled 2019-01-26: qty 0.5

## 2019-01-26 NOTE — ED Notes (Signed)
Large C shaped flap to left scalp. Minimal bleeding. Pt is alert and oriented. Family at bedside

## 2019-01-26 NOTE — ED Triage Notes (Signed)
Patient states he tripped and fell an hour ago and hit his head. Patient has large laceration noted to top of head. Bleeding controlled at this time. Denies pain or dizziness. States he is not on blood thinners at this time.

## 2019-01-26 NOTE — ED Notes (Signed)
Pt ambulatory to waiting room. Pt verbalized understanding of discharge instructions.   

## 2019-01-26 NOTE — Discharge Instructions (Addendum)
Have your sutures removed in 10 days.  Keep your wound clean and dry,  Until a good scab forms - you may then wash gently twice daily with mild soap and water, but dry completely after.  Get rechecked for any sign of infection (redness,  Swelling,  Increased pain or drainage of purulent fluid). ° °

## 2019-01-26 NOTE — ED Provider Notes (Signed)
[facility] EMERGENCY DEPARTMENT Provider Note   CSN: [id number] Arrival date & time: 01/26/19  1344     History   Chief Complaint Chief Complaint  Patient presents with  . Head Injury    HPI Jimmy Knox is a 85 y.o. male.  The history is provided by the patient, the spouse and a relative.  Head Injury  Location:  L parietal Time since incident:  1 hour Mechanism of injury: fall   Fall:    Fall occurred: Patient was walking in his storage unit when he tripped over a ladder and hit his head against the ladder.  He denies LOC, dizziness or any other complaints except for scalp laceration.   Height of fall:  From standing Pain details:    Quality:  Dull   Severity:  Mild   Timing:  Constant Relieved by: The wound bled copiously but was moderately controlled with direct pressure. Associated symptoms: no blurred vision, no disorientation, no double vision, no focal weakness, no headaches, no hearing loss, no loss of consciousness, no memory loss, no nausea, no neck pain, no numbness, no seizures and no vomiting     Past Medical History:  Diagnosis Date  . Anxiety   . Arthritis    "right hip" (07/15/2018)  . Carotid artery disease ([place])    a. <50% bilaterally 05/2016.  Marland Kitchen Chronic combined systolic and diastolic CHF (congestive heart failure) ([place])   . Complication of anesthesia    "didn't wake up well after OR on 07/30/2016; took 2-3 to hold me down"  . Coronary artery disease    a. prior MI/stent around 2000. b. Nuc 09/2015 -> abnl, mgmd medically.  . Facial neuralgia   . History of blood transfusion 07/30/2016   "after the operation"  . Hyperlipidemia   . Hypertension   . Ischemic cardiomyopathy   . Melanoma of nose ([place])    "inside on the right side"  . Myocardial infarction ([place]) ~ 2000  . Pneumonia 1935- 1970s ,"several times"  . Rocky Mountain spotted fever 1942  . Skin cancer    "on my back; arms; froze them off"  . Type 2 diabetes, diet controlled [facility])      Patient Active Problem List   Diagnosis Date Noted  . Bilateral primary osteoarthritis of knee 01/19/2019  . Angina pectoris ([place]) 07/15/2018  . Dyspnea on exertion 06/15/2018  . Primary osteoarthritis of right hip 07/29/2016  . Hypotension 07/29/2016  . Chronic combined systolic and diastolic CHF (congestive heart failure) ([place]) 07/29/2016  . S/P total hip arthroplasty 07/29/2016  . Post-operative state   . Postprocedural hypotension   . Cardiomyopathy, ischemic 10/12/2013  . Carotid artery stenosis 10/12/2013  . Cerebrovascular disease 02/14/2013  . Arteriosclerotic cardiovascular disease (ASCVD)   . Essential hypertension   . Hyperlipidemia   . Fasting hyperglycemia     Past Surgical History:  Procedure Laterality Date  . APPENDECTOMY    . BACK SURGERY    . CATARACT EXTRACTION, BILATERAL Bilateral   . COLONOSCOPY N/A 09/28/2015   Procedure: COLONOSCOPY;  Surgeon: Rogene Houston, MD;  [facility];  Service: Endoscopy;  Laterality: N/A;  955  . CORONARY ANGIOPLASTY WITH STENT PLACEMENT  2000  . CORONARY CTO INTERVENTION  07/15/2018  . CORONARY CTO INTERVENTION N/A 07/15/2018   Procedure: CORONARY CTO INTERVENTION;  Surgeon: Martinique, Peter M, MD;  Location: [facility];  Service: Cardiovascular;  Laterality: N/A;  . CYSTOSCOPY W/ URETERAL STENT PLACEMENT    .  EYE SURGERY Right 2013   "got hit by a limb & knocked my eye out"  . JOINT REPLACEMENT    . LEFT HEART CATH AND CORONARY ANGIOGRAPHY N/A 06/15/2018   Procedure: LEFT HEART CATH AND CORONARY ANGIOGRAPHY;  Surgeon: Martinique, Peter M, MD;  Location: [facility];  Service: Cardiovascular;  Laterality: N/A;  . LUMBAR [place]  . MELANOMA EXCISION Right    "inside my nose"  . TONSILLECTOMY    . TOTAL HIP ARTHROPLASTY Right 07/29/2016   Procedure: TOTAL HIP ARTHROPLASTY;  Surgeon: Garald Balding, MD;  Location: [facility];  Service: Orthopedics;  Laterality: Right;        Home  Medications    Prior to Admission medications   Medication Sig Start Date End Date Taking? Authorizing Provider  amitriptyline (ELAVIL) 50 MG tablet Take 50 mg by mouth at bedtime.    [provider]  aspirin EC 81 MG tablet Take 81 mg by mouth daily.    [provider]  clopidogrel (PLAVIX) 75 MG tablet Take 1 tablet (75 mg total) by mouth daily. 07/16/18   Lyda Jester M, PA-C  losartan (COZAAR) 25 MG tablet TAKE 1 TABLET BY MOUTH ONCE DAILY. 04/30/18   Herminio Commons, MD  metoprolol succinate (TOPROL-XL) 25 MG 24 hr tablet Take 25 mg by mouth daily.    [provider]  MURO 128 2 % ophthalmic solution  01/06/19   [provider]  nitroGLYCERIN (NITROSTAT) 0.4 MG SL tablet Place 1 tablet (0.4 mg total) under the tongue every 5 (five) minutes as needed for chest pain. 07/16/18 07/16/19  Lyda Jester M, PA-C  simvastatin (ZOCOR) 40 MG tablet Take 40 mg by mouth every evening.     [provider]    Family History Family History  Problem Relation Age of Onset  . Diabetes Mellitus II Brother        x2 brothers  . Alzheimer's disease Mother     Social History Social History   Tobacco Use  . Smoking status: Former Smoker    Packs/day: 1.00    Years: 20.00    Pack years: 20.00    Types: Cigarettes    Start date: 12/30/1943  . Smokeless tobacco: Never Used  . Tobacco comment: "stopped smoking in 1970s when I got pneumonia"  Substance Use Topics  . Alcohol use: Never    Alcohol/week: 0.0 standard drinks    Frequency: Never  . Drug use: Never     Allergies   Patient has no known allergies.   Review of Systems Review of Systems  Constitutional: Negative for fever.  HENT: Negative for congestion, facial swelling and hearing loss.   Eyes: Negative.  Negative for blurred vision and double vision.  Respiratory: Negative for chest tightness and shortness of breath.   Cardiovascular: Negative for chest pain.   Gastrointestinal: Negative for abdominal pain, nausea and vomiting.  Genitourinary: Negative.   Musculoskeletal: Negative for arthralgias, joint swelling and neck pain.  Skin: Positive for wound. Negative for rash.  Neurological: Negative for dizziness, focal weakness, seizures, loss of consciousness, syncope, weakness, light-headedness, numbness and headaches.  Psychiatric/Behavioral: Negative.  Negative for memory loss.     Physical Exam Updated Vital Signs BP 91/61 (BP Location: Left Arm)   Pulse 94   Temp (!) 97.4 F (36.3 C) (Oral)   Resp 20   Ht 6' (1.829 m)   Wt 93 kg   SpO2 98%   BMI 27.80 kg/m  Physical Exam Vitals signs and nursing note reviewed.  Constitutional:      Appearance: He is well-developed.  HENT:     Head: Normocephalic.     Comments: Patient has a large stellate laceration of his left lateral parietal scalp.  It is currently hemostatic.  It is through the subcutaneous layer it does not involve deeper layers. Eyes:     Extraocular Movements: Extraocular movements intact.     Right eye: Normal extraocular motion.     Left eye: Normal extraocular motion.     Conjunctiva/sclera: Conjunctivae normal.  Neck:     Musculoskeletal: Normal range of motion. Normal range of motion. No neck rigidity, crepitus, injury or pain with movement.  Cardiovascular:     Rate and Rhythm: Normal rate.  Pulmonary:     Effort: Pulmonary effort is normal.  Musculoskeletal: Normal range of motion.  Skin:    General: Skin is warm and dry.  Neurological:     Mental Status: He is alert and oriented to person, place, and time.     Sensory: Sensation is intact.     Motor: Motor function is intact.     Gait: Gait normal.      ED Treatments / Results  Labs (all labs ordered are listed, but only abnormal results are displayed) Labs Reviewed - No data to display  EKG None  Radiology Ct Head Wo Contrast  Result Date: 01/26/2019 CLINICAL DATA:  Head injury after fall.   No loss of consciousness. EXAM: CT HEAD WITHOUT CONTRAST TECHNIQUE: Contiguous axial images were obtained from the base of the skull through the vertex without intravenous contrast. COMPARISON:  CT scan of April 01, 2018. FINDINGS: Brain: Mild diffuse cortical atrophy is noted. Mild chronic ischemic white matter disease is noted. No mass effect or midline shift is noted. Ventricular size is within normal limits. There is no evidence of mass lesion, hemorrhage or acute infarction. Vascular: No hyperdense vessel or unexpected calcification. Skull: Normal. Negative for fracture or focal lesion. Sinuses/Orbits: No acute finding. Other: Left frontal scalp laceration is noted. IMPRESSION: Mild diffuse cortical atrophy. Mild chronic ischemic white matter disease. Left frontal scalp laceration. No acute intracranial abnormality seen. Electronically Signed   By: Marijo Conception, M.D.   On: 01/26/2019 15:17    Procedures Procedures (including critical care time)  LACERATION REPAIR Performed by: Evalee Jefferson Authorized by: Evalee Jefferson Consent: Verbal consent obtained. Risks and benefits: risks, benefits and alternatives were discussed Consent given by: patient Patient identity confirmed: provided demographic data Prepped and Draped in normal sterile fashion Wound explored  Laceration Location: scalp  Laceration Length: 15 cm  No Foreign Bodies seen or palpated  Anesthesia: local infiltration to touch up after using LET topical  Local anesthetic: lidocaine 2% without epinephrine  Anesthetic total: 2 ml  Irrigation method: syringe Amount of cleaning: standard  Skin closure: ethilon 4-0  Number of sutures: 25  Technique: simple interrupted  Patient tolerance: Patient tolerated the procedure well with no immediate complications.  Complicated wound repair with multiple flaps, stellate laceration.   Medications Ordered in ED Medications  lidocaine-EPINEPHrine-tetracaine (LET) solution (3 mLs  Topical Given 01/26/19 1630)  lidocaine-EPINEPHrine-tetracaine (LET) solution (3 mLs Topical Given 01/26/19 1630)  lidocaine (XYLOCAINE) 2 % injection 10 mL (10 mLs Other Given by Other 01/26/19 1700)  Tdap (BOOSTRIX) injection 0.5 mL (0.5 mLs Intramuscular Given 01/26/19 1846)     Initial Impression / Assessment and Plan / ED Course  I have reviewed the triage vital  signs and the nursing notes.  Pertinent labs & imaging results that were available during my care of the patient were reviewed by me and considered in my medical decision making (see chart for details).     Wound care instructions given.  Pt advised to have sutures removed in 10 days,  Return here sooner for any signs of infection including redness, swelling, worse pain or drainage of pus.  His tetanus was updated today.  He was given wound care instructions along with minor head injury instructions.  CT imaging was reviewed and negative for acute injury.  He was ambulatory in department.  He appears stable for discharge home with his family.  Patient was seen by Dr. Roderic Palau prior to discharge home.     Final Clinical Impressions(s) / ED Diagnoses   Final diagnoses:  Injury of head, initial encounter  Scalp laceration, initial encounter    ED Discharge Orders    None       Landis Martins 01/26/19 Donnetta Hail, MD 01/26/19 [id number]

## 2019-02-03 ENCOUNTER — Other Ambulatory Visit: Payer: Self-pay | Admitting: Cardiovascular Disease

## 2019-02-07 DIAGNOSIS — Z4802 Encounter for removal of sutures: Secondary | ICD-10-CM | POA: Diagnosis not present

## 2019-02-10 DIAGNOSIS — E119 Type 2 diabetes mellitus without complications: Secondary | ICD-10-CM | POA: Diagnosis not present

## 2019-02-17 DIAGNOSIS — Z6828 Body mass index (BMI) 28.0-28.9, adult: Secondary | ICD-10-CM | POA: Diagnosis not present

## 2019-02-17 DIAGNOSIS — E1169 Type 2 diabetes mellitus with other specified complication: Secondary | ICD-10-CM | POA: Diagnosis not present

## 2019-02-17 DIAGNOSIS — I10 Essential (primary) hypertension: Secondary | ICD-10-CM | POA: Diagnosis not present

## 2019-02-17 DIAGNOSIS — E785 Hyperlipidemia, unspecified: Secondary | ICD-10-CM | POA: Diagnosis not present

## 2019-07-22 DIAGNOSIS — H47231 Glaucomatous optic atrophy, right eye: Secondary | ICD-10-CM | POA: Diagnosis not present

## 2019-07-22 DIAGNOSIS — H1811 Bullous keratopathy, right eye: Secondary | ICD-10-CM | POA: Diagnosis not present

## 2019-09-16 ENCOUNTER — Telehealth (INDEPENDENT_AMBULATORY_CARE_PROVIDER_SITE_OTHER): Payer: Medicare Other | Admitting: Cardiovascular Disease

## 2019-09-16 ENCOUNTER — Other Ambulatory Visit: Payer: Self-pay

## 2019-09-16 ENCOUNTER — Encounter: Payer: Self-pay | Admitting: Cardiovascular Disease

## 2019-09-16 VITALS — BP 128/72 | HR 72 | Ht 72.0 in | Wt 203.0 lb

## 2019-09-16 DIAGNOSIS — I25118 Atherosclerotic heart disease of native coronary artery with other forms of angina pectoris: Secondary | ICD-10-CM | POA: Diagnosis not present

## 2019-09-16 DIAGNOSIS — I779 Disorder of arteries and arterioles, unspecified: Secondary | ICD-10-CM

## 2019-09-16 DIAGNOSIS — Z955 Presence of coronary angioplasty implant and graft: Secondary | ICD-10-CM

## 2019-09-16 DIAGNOSIS — E785 Hyperlipidemia, unspecified: Secondary | ICD-10-CM

## 2019-09-16 DIAGNOSIS — I10 Essential (primary) hypertension: Secondary | ICD-10-CM

## 2019-09-16 NOTE — Patient Instructions (Signed)
Medication Instructions: Your physician recommends that you continue on your current medications as directed. Please refer to the Current Medication list given to you today.   Labwork: None today  Procedures/Testing: None  Follow-Up:  6 months Office visit or Telephone visit with Dr.Koneswarn  Any Additional Special Instructions Will Be Listed Below (If Applicable).     If you need a refill on your cardiac medications before your next appointment, please call your pharmacy.     Thank you for choosing [facility] !

## 2019-09-16 NOTE — Addendum Note (Signed)
Addended by: Barbarann Ehlers A on: 09/16/2019 11:17 AM   Modules accepted: Orders

## 2019-09-16 NOTE — Progress Notes (Signed)
Virtual Visit via Telephone Note   This visit type was conducted due to national recommendations for restrictions regarding the COVID-19 Pandemic (e.g. social distancing) in an effort to limit this patient's exposure and mitigate transmission in our community.  Due to his co-morbid illnesses, this patient is at least at moderate risk for complications without adequate follow up.  This format is felt to be most appropriate for this patient at this time.  The patient did not have access to video technology/had technical difficulties with video requiring transitioning to audio format only (telephone).  All issues noted in this document were discussed and addressed.  No physical exam could be performed with this format.  Please refer to the patient's chart for his  consent to telehealth for [facility].   Date:  09/16/2019   ID:  Jimmy Knox, DOB June 02, 1932, MRN [id number]  Patient Location: Home Provider Location: Office  PCP:  Asencion Noble, MD  Cardiologist:  Kate Sable, MD  Electrophysiologist:  None   Evaluation Performed:  Follow-Up Visit  Chief Complaint:  CAD  History of Present Illness:    Jimmy Knox is a 85 y.o. male with coronary artery disease. He underwent CTO PCI of the mid RCA with a drug-eluting stent on 07/15/2018. He underwent unsuccessful CTO PCI of the proximal left circumflex due to inability to cross with a wire.   I spoke with his wife, Patsy, as well. She is also my patient.   His chronic exertional dyspnea is stable. He denies chest pain and leg swelling. He remains very active working on his yard.  [place].  The patient does not have symptoms concerning for COVID-19 infection (fever, chills, cough).   SocHx: Married for 60yearsinJune 2020. Has 2 children and 3 granddaughters who live fairly close to them.  One granddaughter is Materials engineer and math at [employer], another granddaughter is Scientist, product/process development at [employer],  and other one is a Paramedic in [employer].   Past Medical History:  Diagnosis Date  . Anxiety   . Arthritis    "right hip" (07/15/2018)  . Carotid artery disease ([place])    a. <50% bilaterally 05/2016.  Marland Kitchen Chronic combined systolic and diastolic CHF (congestive heart failure) ([place])   . Complication of anesthesia    "didn't wake up well after OR on 07/30/2016; took 2-3 to hold me down"  . Coronary artery disease    a. prior MI/stent around 2000. b. Nuc 09/2015 -> abnl, mgmd medically.  . Facial neuralgia   . History of blood transfusion 07/30/2016   "after the operation"  . Hyperlipidemia   . Hypertension   . Ischemic cardiomyopathy   . Melanoma of nose ([place])    "inside on the right side"  . Myocardial infarction ([place]) ~ 2000  . Pneumonia 1935- 1970s ,"several times"  . Rocky Mountain spotted fever 1942  . Skin cancer    "on my back; arms; froze them off"  . Type 2 diabetes, diet controlled ([place])    Past Surgical History:  Procedure Laterality Date  . APPENDECTOMY    . BACK SURGERY    . CATARACT EXTRACTION, BILATERAL Bilateral   . COLONOSCOPY N/A 09/28/2015   Procedure: COLONOSCOPY;  Surgeon: Rogene Houston, MD;  [facility];  Service: Endoscopy;  Laterality: N/A;  955  . CORONARY ANGIOPLASTY WITH STENT PLACEMENT  2000  . CORONARY CTO INTERVENTION  07/15/2018  . CORONARY CTO INTERVENTION N/A 07/15/2018  Procedure: CORONARY CTO INTERVENTION;  Surgeon: Martinique, Peter M, MD;  Location: [place] [facility];  Service: Cardiovascular;  Laterality: N/A;  . CYSTOSCOPY W/ URETERAL STENT PLACEMENT    . EYE SURGERY Right 2013   "got hit by a limb & knocked my eye out"  . JOINT REPLACEMENT    . LEFT HEART CATH AND CORONARY ANGIOGRAPHY N/A 06/15/2018   Procedure: LEFT HEART CATH AND CORONARY ANGIOGRAPHY;  Surgeon: Martinique, Peter M, MD;  Location: [place] [facility];  Service: Cardiovascular;  Laterality: N/A;  . LUMBAR [place]  . MELANOMA EXCISION Right    "inside my  nose"  . TONSILLECTOMY    . TOTAL HIP ARTHROPLASTY Right 07/29/2016   Procedure: TOTAL HIP ARTHROPLASTY;  Surgeon: Garald Balding, MD;  Location: [place];  Service: Orthopedics;  Laterality: Right;     Current Meds  Medication Sig  . amitriptyline (ELAVIL) 50 MG tablet Take 50 mg by mouth at bedtime.  Marland Kitchen aspirin EC 81 MG tablet Take 81 mg by mouth daily.  Marland Kitchen losartan (COZAAR) 25 MG tablet TAKE 1 TABLET BY MOUTH ONCE DAILY.  . metoprolol succinate (TOPROL-XL) 25 MG 24 hr tablet Take 25 mg by mouth daily.  Marland Kitchen MURO 128 2 % ophthalmic solution   . nitroGLYCERIN (NITROSTAT) 0.4 MG SL tablet Place 1 tablet (0.4 mg total) under the tongue every 5 (five) minutes as needed for chest pain.  . simvastatin (ZOCOR) 40 MG tablet Take 40 mg by mouth every evening.      Allergies:   Patient has no known allergies.   Social History   Tobacco Use  . Smoking status: Former Smoker    Packs/day: 1.00    Years: 20.00    Pack years: 20.00    Types: Cigarettes    Start date: 12/30/1943  . Smokeless tobacco: Never Used  . Tobacco comment: "stopped smoking in 1970s when I got pneumonia"  Substance Use Topics  . Alcohol use: Never    Alcohol/week: 0.0 standard drinks    Frequency: Never  . Drug use: Never     Family Hx: The patient's family history includes Alzheimer's disease in his mother; Diabetes Mellitus II in his brother.  ROS:   Please see the history of present illness.     All other systems reviewed and are negative.   Prior CV studies:   The following studies were reviewed today:  See above  Labs/Other Tests and Data Reviewed:    EKG:  No ECG reviewed.  Recent Labs: No results found for requested labs within last 8760 hours.   Recent Lipid Panel No results found for: CHOL, TRIG, HDL, CHOLHDL, LDLCALC, LDLDIRECT  Wt Readings from Last 3 Encounters:  09/16/19 203 lb (92.1 kg)  01/26/19 205 lb (93 kg)  01/19/19 209 lb (94.8 kg)     Objective:    Vital Signs:  BP 128/72    Pulse 72   Ht 6' (1.829 m)   Wt 203 lb (92.1 kg)   BMI 27.53 kg/m    VITAL SIGNS:  reviewed  ASSESSMENT & PLAN:    1. Coronary artery disease: Symptomatically stable. Status post CTO PCI of the mid RCA on 07/15/2018. Attempted CTO PCI of the proximal left circumflex was unsuccessful due to inability to cross with a wire. Continue ASA, losartan, Toprol-XL, and simvastatin.   2. Hypertension: Controlled on present therapy. No changes.  3. Hyperlipidemia: Continue simvastatin 40 mg. I will obtain a copy of lipids from  PCP.  4.Carotid artery disease: Carotid Dopplers 06/20/16 less than 50% internal carotid artery stenosis bilaterally. Would repeat in 2years.   COVID-19 Education: The signs and symptoms of COVID-19 were discussed with the patient and how to seek care for testing (follow up with PCP or arrange E-visit).  The importance of social distancing was discussed today.  Time:   Today, I have spent 5 minutes with the patient with telehealth technology discussing the above problems.     Medication Adjustments/Labs and Tests Ordered: Current medicines are reviewed at length with the patient today.  Concerns regarding medicines are outlined above.   Tests Ordered: No orders of the defined types were placed in this encounter.   Medication Changes: No orders of the defined types were placed in this encounter.   Follow Up:  Virtual Visit or In Person in 6 month(s)  Signed, Kate Sable, MD  09/16/2019 11:08 AM    [place]

## 2019-10-24 DIAGNOSIS — Z23 Encounter for immunization: Secondary | ICD-10-CM | POA: Diagnosis not present

## 2019-11-07 ENCOUNTER — Other Ambulatory Visit: Payer: Self-pay | Admitting: Cardiology

## 2019-11-09 DIAGNOSIS — I10 Essential (primary) hypertension: Secondary | ICD-10-CM | POA: Diagnosis not present

## 2019-11-09 DIAGNOSIS — E119 Type 2 diabetes mellitus without complications: Secondary | ICD-10-CM | POA: Diagnosis not present

## 2019-11-09 DIAGNOSIS — E785 Hyperlipidemia, unspecified: Secondary | ICD-10-CM | POA: Diagnosis not present

## 2019-11-09 DIAGNOSIS — I2510 Atherosclerotic heart disease of native coronary artery without angina pectoris: Secondary | ICD-10-CM | POA: Diagnosis not present

## 2019-11-09 DIAGNOSIS — Z79899 Other long term (current) drug therapy: Secondary | ICD-10-CM | POA: Diagnosis not present

## 2019-11-09 DIAGNOSIS — I255 Ischemic cardiomyopathy: Secondary | ICD-10-CM | POA: Diagnosis not present

## 2019-11-16 DIAGNOSIS — I255 Ischemic cardiomyopathy: Secondary | ICD-10-CM | POA: Diagnosis not present

## 2019-11-16 DIAGNOSIS — I10 Essential (primary) hypertension: Secondary | ICD-10-CM | POA: Diagnosis not present

## 2019-11-16 DIAGNOSIS — E1159 Type 2 diabetes mellitus with other circulatory complications: Secondary | ICD-10-CM | POA: Diagnosis not present

## 2019-11-16 DIAGNOSIS — E785 Hyperlipidemia, unspecified: Secondary | ICD-10-CM | POA: Diagnosis not present

## 2019-11-16 DIAGNOSIS — I2510 Atherosclerotic heart disease of native coronary artery without angina pectoris: Secondary | ICD-10-CM | POA: Diagnosis not present

## 2020-01-03 DIAGNOSIS — Z23 Encounter for immunization: Secondary | ICD-10-CM | POA: Diagnosis not present

## 2020-01-25 DIAGNOSIS — H1811 Bullous keratopathy, right eye: Secondary | ICD-10-CM | POA: Diagnosis not present

## 2020-01-25 DIAGNOSIS — H47231 Glaucomatous optic atrophy, right eye: Secondary | ICD-10-CM | POA: Diagnosis not present

## 2020-01-26 ENCOUNTER — Other Ambulatory Visit: Payer: Self-pay | Admitting: Cardiovascular Disease

## 2020-01-31 DIAGNOSIS — Z23 Encounter for immunization: Secondary | ICD-10-CM | POA: Diagnosis not present

## 2020-02-29 ENCOUNTER — Encounter: Payer: Self-pay | Admitting: Cardiovascular Disease

## 2020-02-29 ENCOUNTER — Telehealth (INDEPENDENT_AMBULATORY_CARE_PROVIDER_SITE_OTHER): Payer: Medicare Other | Admitting: Cardiovascular Disease

## 2020-02-29 VITALS — BP 118/62 | Ht 72.0 in | Wt 205.0 lb

## 2020-02-29 DIAGNOSIS — R0600 Dyspnea, unspecified: Secondary | ICD-10-CM

## 2020-02-29 DIAGNOSIS — Z955 Presence of coronary angioplasty implant and graft: Secondary | ICD-10-CM

## 2020-02-29 DIAGNOSIS — R42 Dizziness and giddiness: Secondary | ICD-10-CM

## 2020-02-29 DIAGNOSIS — I10 Essential (primary) hypertension: Secondary | ICD-10-CM

## 2020-02-29 DIAGNOSIS — I779 Disorder of arteries and arterioles, unspecified: Secondary | ICD-10-CM

## 2020-02-29 DIAGNOSIS — R0609 Other forms of dyspnea: Secondary | ICD-10-CM

## 2020-02-29 DIAGNOSIS — I25118 Atherosclerotic heart disease of native coronary artery with other forms of angina pectoris: Secondary | ICD-10-CM

## 2020-02-29 DIAGNOSIS — E785 Hyperlipidemia, unspecified: Secondary | ICD-10-CM

## 2020-02-29 MED ORDER — ISOSORBIDE MONONITRATE ER 30 MG PO TB24: 30.0000 mg | ORAL_TABLET | Freq: Every day | ORAL | 3 refills | Status: DC

## 2020-02-29 MED ORDER — LOSARTAN POTASSIUM 25 MG PO TABS: 12.5000 mg | ORAL_TABLET | Freq: Every day | ORAL | 3 refills | Status: DC

## 2020-02-29 NOTE — Patient Instructions (Signed)
Medication Instructions:  Start imdur 30 mg daily   Decrease losartan to 12.5 mg daily   Labwork: bnp Cbc bmet   Testing/Procedures:  Your physician has requested that you have an echocardiogram. Echocardiography is a painless test that uses sound waves to create images of your heart. It provides your doctor with information about the size and shape of your heart and how well your heart's chambers and valves are working. This procedure takes approximately one hour. There are no restrictions for this procedure.  A chest x-ray takes a picture of the organs and structures inside the chest, including the heart, lungs, and blood vessels. This test can show several things, including, whether the heart is enlarges; whether fluid is building up in the lungs; and whether pacemaker / defibrillator leads are still in place.  Your physician has requested that you have a carotid duplex. This test is an ultrasound of the carotid arteries in your neck. It looks at blood flow through these arteries that supply the brain with blood. Allow one hour for this exam. There are no restrictions or special instructions.   Follow-Up: Your physician recommends that you schedule a follow-up appointment in: 6 weeks    Any Other Special Instructions Will Be Listed Below (If Applicable).     If you need a refill on your cardiac medications before your next appointment, please call your pharmacy.

## 2020-02-29 NOTE — Progress Notes (Signed)
Virtual Visit via Telephone Note   This visit type was conducted due to national recommendations for restrictions regarding the COVID-19 Pandemic (e.g. social distancing) in an effort to limit this patient's exposure and mitigate transmission in our community.  Due to his co-morbid illnesses, this patient is at least at moderate risk for complications without adequate follow up.  This format is felt to be most appropriate for this patient at this time.  The patient did not have access to video technology/had technical difficulties with video requiring transitioning to audio format only (telephone).  All issues noted in this document were discussed and addressed.  No physical exam could be performed with this format.  Please refer to the patient's chart for his  consent to telehealth for [facility].   Date:  02/29/2020   ID:  Jimmy Knox, DOB Jan 14, 1932, MRN [id number]  Patient Location: Home Provider Location: Home  PCP:  Asencion Noble, MD  Cardiologist:  Kate Sable, MD  Electrophysiologist:  None   Evaluation Performed:  Follow-Up Visit  Chief Complaint:  CAD  History of Present Illness:    Jimmy Knox is a 86 y.o. male with coronary artery disease. He underwent CTO PCI of the mid RCA with a drug-eluting stent on 07/15/2018. He underwent unsuccessful CTO PCI of the proximal left circumflex due to inability to cross with a wire.   His wife, Jimmy Knox, is also my patient.   He has been having some shortness of breath which he noticed during cardiac rehab. After exercising he would become slightly dizzy.  He's felt better over the past 2 days.   His wife's physical therapist checked his vitals yesterday and he was told BP and HR were normal. His O2 sats were initially low but gradually normalized.  If he walks long distances he becomes short of breath and dizzy. This resolves with rest.   He denies cough and fever. He denies leg swelling.   SocHx: Married for  60yearsinJune 2020. Has 2 children and 3 granddaughters who live fairly close to them.One granddaughter is Materials engineer and math at [employer], another granddaughter is studying Careers information officer, and other one is a Paramedic in high school.  Past Medical History:  Diagnosis Date  . Anxiety   . Arthritis    "right hip" (07/15/2018)  . Carotid artery disease ([place])    a. <50% bilaterally 05/2016.  Marland Kitchen Chronic combined systolic and diastolic CHF (congestive heart failure) ([place])   . Complication of anesthesia    "didn't wake up well after OR on 07/30/2016; took 2-3 to hold me down"  . Coronary artery disease    a. prior MI/stent around 2000. b. Nuc 09/2015 -> abnl, mgmd medically.  . Facial neuralgia   . History of blood transfusion 07/30/2016   "after the operation"  . Hyperlipidemia   . Hypertension   . Ischemic cardiomyopathy   . Melanoma of nose ([place])    "inside on the right side"  . Myocardial infarction ([place]) ~ 2000  . Pneumonia 1935- 1970s ,"several times"  . Rocky Mountain spotted fever 1942  . Skin cancer    "on my back; arms; froze them off"  . Type 2 diabetes, diet controlled ([place])    Past Surgical History:  Procedure Laterality Date  . APPENDECTOMY    . BACK SURGERY    . CATARACT EXTRACTION, BILATERAL Bilateral   . COLONOSCOPY N/A 09/28/2015   Procedure: COLONOSCOPY;  Surgeon: Rogene Houston, MD;  [facility];  Service: Endoscopy;  Laterality: N/A;  955  . CORONARY ANGIOPLASTY WITH STENT PLACEMENT  2000  . CORONARY CTO INTERVENTION  07/15/2018  . CORONARY CTO INTERVENTION N/A 07/15/2018   Procedure: CORONARY CTO INTERVENTION;  Surgeon: Martinique, Peter M, MD;  Location: [place] [facility];  Service: Cardiovascular;  Laterality: N/A;  . CYSTOSCOPY W/ URETERAL STENT PLACEMENT    . EYE SURGERY Right 2013   "got hit by a limb & knocked my eye out"  . JOINT REPLACEMENT    . LEFT HEART CATH AND CORONARY ANGIOGRAPHY N/A 06/15/2018   Procedure: LEFT HEART CATH AND  CORONARY ANGIOGRAPHY;  Surgeon: Martinique, Peter M, MD;  Location: [facility];  Service: Cardiovascular;  Laterality: N/A;  . LUMBAR [place]  . MELANOMA EXCISION Right    "inside my nose"  . TONSILLECTOMY    . TOTAL HIP ARTHROPLASTY Right 07/29/2016   Procedure: TOTAL HIP ARTHROPLASTY;  Surgeon: Garald Balding, MD;  Location: [facility];  Service: Orthopedics;  Laterality: Right;     Current Meds  Medication Sig  . amitriptyline (ELAVIL) 50 MG tablet Take 50 mg by mouth at bedtime.  Marland Kitchen aspirin EC 81 MG tablet Take 81 mg by mouth daily.  Marland Kitchen losartan (COZAAR) 25 MG tablet TAKE 1 TABLET BY MOUTH ONCE DAILY.  . metoprolol succinate (TOPROL-XL) 25 MG 24 hr tablet Take 25 mg by mouth daily.  Marland Kitchen MURO 128 2 % ophthalmic solution   . nitroGLYCERIN (NITROSTAT) 0.4 MG SL tablet Place 1 tablet (0.4 mg total) under the tongue every 5 (five) minutes as needed for chest pain.  . simvastatin (ZOCOR) 40 MG tablet Take 40 mg by mouth every evening.      Allergies:   Patient has no known allergies.   Social History   Tobacco Use  . Smoking status: Former Smoker    Packs/day: 1.00    Years: 20.00    Pack years: 20.00    Types: Cigarettes    Start date: 12/30/1943  . Smokeless tobacco: Never Used  . Tobacco comment: "stopped smoking in 1970s when I got pneumonia"  Substance Use Topics  . Alcohol use: Never    Alcohol/week: 0.0 standard drinks  . Drug use: Never     Family Hx: The patient's family history includes Alzheimer's disease in his mother; Diabetes Mellitus II in his brother.  ROS:   Please see the history of present illness.     All other systems reviewed and are negative.   Prior CV studies:   The following studies were reviewed today:  Echocardiogram 05/10/2018:  Left ventricle: The cavity size was normal. Wall thickness was  increased in a pattern of mild LVH. Systolic function was normal.  The estimated ejection fraction was in the range of 50% to 55%.    There is hypokinesis of the mid-apicalanteroseptal myocardium.  There is hypokinesis of the apicalinferior myocardium. Doppler  parameters are consistent with abnormal left ventricular  relaxation (grade 1 diastolic dysfunction).  - Aortic valve: Mildly to moderately calcified annulus. Trileaflet;  mildly calcified leaflets. There was trivial regurgitation.  - Mitral valve: Mildly calcified annulus. There was trivial  regurgitation.  - Right atrium: Central venous pressure (est): 3 mm Hg.  - Atrial septum: No defect or patent foramen ovale was identified.  - Tricuspid valve: There was trivial regurgitation.  - Pulmonary arteries: Systolic pressure could not be accurately  estimated.  - Pericardium, extracardiac: There was no pericardial effusion.   Labs/Other Tests  and Data Reviewed:    EKG:  No ECG reviewed.  Recent Labs: No results found for requested labs within last 8760 hours.   Recent Lipid Panel No results found for: CHOL, TRIG, HDL, CHOLHDL, LDLCALC, LDLDIRECT  Wt Readings from Last 3 Encounters:  02/29/20 205 lb (93 kg)  09/16/19 203 lb (92.1 kg)  01/26/19 205 lb (93 kg)     Objective:    Vital Signs:  BP 118/62   Ht 6' (1.829 m)   Wt 205 lb (93 kg)   SpO2 98%   BMI 27.80 kg/m    VITAL SIGNS:  reviewed  ASSESSMENT & PLAN:    1. Coronary artery disease with exertional dyspnea and dizziness: Status post CTO PCI of the mid RCA on 07/15/2018. Attempted CTO PCI of the proximal left circumflex was unsuccessful due to inability to cross with a wire. Continue ASA, Toprol-XL, and simvastatin. As he has been experiencing exertional dyspnea, I will add Imdur 30 mg daily to see if this helps improve symptoms.  I will reduce the dose of losartan to 12.5 mg daily so as not to precipitate hypotension.  I will obtain a chest x-ray.  I will also obtain an echocardiogram to reevaluate cardiac structure and function and to assess for interval changes.  I will check  a basic metabolic panel, CBC, and BNP as well.  2. Hypertension: BP is normal.  This will need continued monitoring given medication adjustments in #1.  3. Hyperlipidemia: Continue simvastatin 40 mg.  LDL 60 on 11/09/2019.  4.Carotid artery disease: Carotid Dopplers 06/20/16 less than 50% internal carotid artery stenosis bilaterally.  I will repeat carotid Dopplers.    COVID-19 Education: The signs and symptoms of COVID-19 were discussed with the patient and how to seek care for testing (follow up with PCP or arrange E-visit).  The importance of social distancing was discussed today.  Time:   Today, I have spent 25 minutes with the patient with telehealth technology discussing the above problems.     Medication Adjustments/Labs and Tests Ordered: Current medicines are reviewed at length with the patient today.  Concerns regarding medicines are outlined above.   Tests Ordered: No orders of the defined types were placed in this encounter.   Medication Changes: No orders of the defined types were placed in this encounter.   Follow Up:  Virtual Visit  in 6 week(s)  Signed, Kate Sable, MD  02/29/2020 11:03 AM    [place]

## 2020-02-29 NOTE — Addendum Note (Signed)
Addended by: Debbora Lacrosse R on: 02/29/2020 11:24 AM   Modules accepted: Orders

## 2020-03-02 ENCOUNTER — Telehealth: Payer: Self-pay | Admitting: Cardiovascular Disease

## 2020-03-02 NOTE — Telephone Encounter (Signed)
Pre-cert Verification for the following procedure    [place] CARTOID & ECHO   DATE:03/13/2020  LOCATION: [facility]

## 2020-03-07 ENCOUNTER — Ambulatory Visit (HOSPITAL_COMMUNITY): Payer: Medicare Other

## 2020-03-07 ENCOUNTER — Other Ambulatory Visit (HOSPITAL_COMMUNITY): Payer: Medicare Other

## 2020-03-13 ENCOUNTER — Other Ambulatory Visit: Payer: Self-pay

## 2020-03-13 ENCOUNTER — Ambulatory Visit (HOSPITAL_COMMUNITY)
Admission: RE | Admit: 2020-03-13 | Discharge: 2020-03-13 | Disposition: A | Discharge status: Home / Self Care | Payer: Medicare Other | Source: Ambulatory Visit | Attending: Cardiovascular Disease | Admitting: Cardiovascular Disease

## 2020-03-13 ENCOUNTER — Encounter (HOSPITAL_COMMUNITY): Payer: Self-pay

## 2020-03-13 DIAGNOSIS — R0600 Dyspnea, unspecified: Secondary | ICD-10-CM

## 2020-03-13 DIAGNOSIS — I6523 Occlusion and stenosis of bilateral carotid arteries: Secondary | ICD-10-CM | POA: Diagnosis not present

## 2020-03-13 DIAGNOSIS — I25118 Atherosclerotic heart disease of native coronary artery with other forms of angina pectoris: Secondary | ICD-10-CM

## 2020-03-13 DIAGNOSIS — R0602 Shortness of breath: Secondary | ICD-10-CM | POA: Diagnosis not present

## 2020-03-13 DIAGNOSIS — I779 Disorder of arteries and arterioles, unspecified: Secondary | ICD-10-CM | POA: Insufficient documentation

## 2020-03-13 DIAGNOSIS — R0609 Other forms of dyspnea: Secondary | ICD-10-CM

## 2020-03-13 NOTE — Progress Notes (Signed)
*  PRELIMINARY RESULTS* Echocardiogram 2D Echocardiogram has been performed.  Jimmy Knox 03/13/2020, 3:58 PM

## 2020-03-14 ENCOUNTER — Telehealth: Payer: Self-pay

## 2020-03-14 DIAGNOSIS — J849 Interstitial pulmonary disease, unspecified: Secondary | ICD-10-CM

## 2020-03-14 NOTE — Telephone Encounter (Signed)
Left message for patient to call back.  Order placed for CT  Patient still needs to get labs done from 02/29/20 order for bmet, bnp ,cbc

## 2020-03-14 NOTE — Telephone Encounter (Signed)
-----   Message from Herminio Commons, MD sent at 03/14/2020 10:38 AM EDT ----- There are areas of worsening patchy areas of septal thickening in both lungs.  Unclear if he has developed interstitial lung disease.  Please obtain high-resolution chest CT.

## 2020-03-16 ENCOUNTER — Telehealth: Payer: Self-pay

## 2020-03-16 NOTE — Telephone Encounter (Signed)
Daughter states she rec'd a call that Pt will need a CXR.  Please call to confirm [phone]  thanks renee

## 2020-03-16 NOTE — Telephone Encounter (Signed)
Returned pt call. No answer, left voicemail for pt to return call.

## 2020-03-16 NOTE — Telephone Encounter (Signed)
Spoke with pt. Informed him of results as well as the new test that was ordered. Asked Jarrett Soho in front office to schedule & call pt.

## 2020-03-20 NOTE — Telephone Encounter (Signed)
High resolution chest CT set up for 3/31, arrive radiology at 845 am    Wifeto check with daughter and call me back

## 2020-03-20 NOTE — Telephone Encounter (Signed)
-----   Message from Herminio Commons, MD sent at 03/14/2020 10:38 AM EDT ----- There are areas of worsening patchy areas of septal thickening in both lungs.  Unclear if he has developed interstitial lung disease.  Please obtain high-resolution chest CT.

## 2020-03-27 ENCOUNTER — Other Ambulatory Visit: Payer: Self-pay | Admitting: Cardiovascular Disease

## 2020-03-27 DIAGNOSIS — R42 Dizziness and giddiness: Secondary | ICD-10-CM | POA: Diagnosis not present

## 2020-03-27 DIAGNOSIS — R0600 Dyspnea, unspecified: Secondary | ICD-10-CM | POA: Diagnosis not present

## 2020-03-27 DIAGNOSIS — I10 Essential (primary) hypertension: Secondary | ICD-10-CM | POA: Diagnosis not present

## 2020-03-27 NOTE — Telephone Encounter (Signed)
I spoke with wife, pt will come to [facility] lab today

## 2020-03-28 ENCOUNTER — Other Ambulatory Visit: Payer: Self-pay

## 2020-03-28 ENCOUNTER — Ambulatory Visit (HOSPITAL_COMMUNITY)
Admission: RE | Admit: 2020-03-28 | Discharge: 2020-03-28 | Disposition: A | Discharge status: Home / Self Care | Payer: Medicare Other | Source: Ambulatory Visit | Attending: Cardiovascular Disease | Admitting: Cardiovascular Disease

## 2020-03-28 DIAGNOSIS — J849 Interstitial pulmonary disease, unspecified: Secondary | ICD-10-CM | POA: Diagnosis not present

## 2020-03-28 DIAGNOSIS — J479 Bronchiectasis, uncomplicated: Secondary | ICD-10-CM | POA: Diagnosis not present

## 2020-03-28 LAB — CBC/DIFF AMBIGUOUS DEFAULT
Basophils Absolute: 0.1 10*3/uL (ref 0.0–0.2)
Basos: 1 %
EOS (ABSOLUTE): 0.2 10*3/uL (ref 0.0–0.4)
Eos: 2 %
Hematocrit: 43.2 % (ref 37.5–51.0)
Hemoglobin: 14.2 g/dL (ref 13.0–17.7)
Immature Grans (Abs): 0 10*3/uL (ref 0.0–0.1)
Immature Granulocytes: 0 %
Lymphocytes Absolute: 4.1 10*3/uL — ABNORMAL HIGH (ref 0.7–3.1)
Lymphs: 45 %
MCH: 30.5 pg (ref 26.6–33.0)
MCHC: 32.9 g/dL (ref 31.5–35.7)
MCV: 93 fL (ref 79–97)
Monocytes Absolute: 0.5 10*3/uL (ref 0.1–0.9)
Monocytes: 6 %
Neutrophils Absolute: 4.3 10*3/uL (ref 1.4–7.0)
Neutrophils: 46 %
Platelets: 242 10*3/uL (ref 150–450)
RBC: 4.65 x10E6/uL (ref 4.14–5.80)
RDW: 13.2 % (ref 11.6–15.4)
WBC: 9.1 10*3/uL (ref 3.4–10.8)

## 2020-03-28 LAB — BRAIN NATRIURETIC PEPTIDE: BNP: 131.2 pg/mL — ABNORMAL HIGH (ref 0.0–100.0)

## 2020-03-28 LAB — BASIC METABOLIC PANEL
BUN/Creatinine Ratio: 17 (ref 10–24)
BUN: 16 mg/dL (ref 8–27)
CO2: 23 mmol/L (ref 20–29)
Calcium: 9.1 mg/dL (ref 8.6–10.2)
Chloride: 104 mmol/L (ref 96–106)
Creatinine, Ser: 0.94 mg/dL (ref 0.76–1.27)
GFR calc Af Amer: 85 mL/min/{1.73_m2} (ref >59)
GFR calc non Af Amer: 73 mL/min/{1.73_m2} (ref >59)
Glucose: 100 mg/dL — ABNORMAL HIGH (ref 65–99)
Potassium: 4.7 mmol/L (ref 3.5–5.2)
Sodium: 141 mmol/L (ref 134–144)

## 2020-03-28 LAB — SPECIMEN STATUS REPORT

## 2020-03-29 ENCOUNTER — Telehealth: Payer: Self-pay

## 2020-03-29 DIAGNOSIS — J849 Interstitial pulmonary disease, unspecified: Secondary | ICD-10-CM

## 2020-03-29 NOTE — Telephone Encounter (Signed)
Jimmy Commons, MD  Bernita Raisin, RN  Interstitial lung disease seen. Please make pulmonary referral. He will likely need PFT's as last one was in 2014.        Referral placed to pulmonary, wife made aware.

## 2020-03-29 NOTE — Telephone Encounter (Signed)
-----   Message from Herminio Commons, MD sent at 03/28/2020  5:55 PM EDT ----- Interstitial lung disease seen. Please make pulmonary referral. He will likely need PFT's as last one was in 2014.

## 2020-04-11 ENCOUNTER — Ambulatory Visit: Payer: Medicare Other | Admitting: Cardiovascular Disease

## 2020-04-12 ENCOUNTER — Encounter: Payer: Self-pay | Admitting: Cardiovascular Disease

## 2020-04-12 ENCOUNTER — Other Ambulatory Visit: Payer: Self-pay

## 2020-04-12 ENCOUNTER — Ambulatory Visit (INDEPENDENT_AMBULATORY_CARE_PROVIDER_SITE_OTHER): Payer: Medicare Other | Admitting: Cardiovascular Disease

## 2020-04-12 VITALS — BP 126/76 | HR 82 | Ht 73.0 in | Wt 206.0 lb

## 2020-04-12 DIAGNOSIS — I779 Disorder of arteries and arterioles, unspecified: Secondary | ICD-10-CM

## 2020-04-12 DIAGNOSIS — Z955 Presence of coronary angioplasty implant and graft: Secondary | ICD-10-CM | POA: Diagnosis not present

## 2020-04-12 DIAGNOSIS — J849 Interstitial pulmonary disease, unspecified: Secondary | ICD-10-CM

## 2020-04-12 DIAGNOSIS — I25118 Atherosclerotic heart disease of native coronary artery with other forms of angina pectoris: Secondary | ICD-10-CM | POA: Diagnosis not present

## 2020-04-12 DIAGNOSIS — E785 Hyperlipidemia, unspecified: Secondary | ICD-10-CM | POA: Diagnosis not present

## 2020-04-12 DIAGNOSIS — I10 Essential (primary) hypertension: Secondary | ICD-10-CM

## 2020-04-12 NOTE — Progress Notes (Signed)
SUBJECTIVE: The patient presents for follow-up of exertional dyspnea.  High-resolution chest CT demonstrated interstitial lung disease.  I have already made a referral to pulmonary.  He has a history of coronary artery disease.He underwent CTO PCI of the mid RCA with a drug-eluting stent on 07/15/2018. He underwent unsuccessful CTO PCI of the proximal left circumflex due to inability to cross with a wire.  His wife, [name], is also my patient.   Labs on 03/27/2020 showed BNP of 131, normal CBC, and normal renal function.  Since starting Imdur his dizziness has resolved.  His shortness of breath has improved to some degree as well.  His wife has been bothered by leg and feet swelling.  She was hospitalized for anemia in March.   SocHx: Married for60yearsinJune 2020. Has 2 children and 3 granddaughters who live fairly close to them.One granddaughter is Materials engineer and math at [employer], another granddaughter is studying Careers information officer, and other one is a Paramedic in high school.  Review of Systems: As per "subjective", otherwise negative.  No Known Allergies  Current Outpatient Medications  Medication Sig Dispense Refill  . amitriptyline (ELAVIL) 50 MG tablet Take 50 mg by mouth at bedtime.    Marland Kitchen aspirin EC 81 MG tablet Take 81 mg by mouth daily.    . clopidogrel (PLAVIX) 75 MG tablet Take 75 mg by mouth daily.    . isosorbide mononitrate (IMDUR) 30 MG 24 hr tablet Take 1 tablet (30 mg total) by mouth daily. 90 tablet 3  . losartan (COZAAR) 25 MG tablet Take 0.5 tablets (12.5 mg total) by mouth daily. 45 tablet 3  . metoprolol succinate (TOPROL-XL) 25 MG 24 hr tablet Take 25 mg by mouth daily.    Marland Kitchen MURO 128 2 % ophthalmic solution     . nitroGLYCERIN (NITROSTAT) 0.4 MG SL tablet Place 1 tablet (0.4 mg total) under the tongue every 5 (five) minutes as needed for chest pain. 25 tablet 2  . simvastatin (ZOCOR) 40 MG tablet Take 40 mg by mouth every evening.      No  current facility-administered medications for this visit.    Past Medical History:  Diagnosis Date  . Anxiety   . Arthritis    "right hip" (07/15/2018)  . Carotid artery disease ([place])    a. <50% bilaterally 05/2016.  Marland Kitchen Chronic combined systolic and diastolic CHF (congestive heart failure) ([place])   . Complication of anesthesia    "didn't wake up well after OR on 07/30/2016; took 2-3 to hold me down"  . Coronary artery disease    a. prior MI/stent around 2000. b. Nuc 09/2015 -> abnl, mgmd medically.  . Facial neuralgia   . History of blood transfusion 07/30/2016   "after the operation"  . Hyperlipidemia   . Hypertension   . Ischemic cardiomyopathy   . Melanoma of nose ([place])    "inside on the right side"  . Myocardial infarction ([place]) ~ 2000  . Pneumonia 1935- 1970s ,"several times"  . Rocky Mountain spotted fever 1942  . Skin cancer    "on my back; arms; froze them off"  . Type 2 diabetes, diet controlled ([place])     Past Surgical History:  Procedure Laterality Date  . APPENDECTOMY    . BACK SURGERY    . CATARACT EXTRACTION, BILATERAL Bilateral   . COLONOSCOPY N/A 09/28/2015   Procedure: COLONOSCOPY;  Surgeon: Rogene Houston, MD;  [facility];  Service: Endoscopy;  Laterality: N/A;  [place] WITH STENT PLACEMENT  2000  . CORONARY CTO INTERVENTION  07/15/2018  . CORONARY CTO INTERVENTION N/A 07/15/2018   Procedure: CORONARY CTO INTERVENTION;  Surgeon: Martinique, Peter M, MD;  Location: [facility];  Service: Cardiovascular;  Laterality: N/A;  . CYSTOSCOPY W/ URETERAL STENT PLACEMENT    . EYE SURGERY Right 2013   "got hit by a limb & knocked my eye out"  . JOINT REPLACEMENT    . LEFT HEART CATH AND CORONARY ANGIOGRAPHY N/A 06/15/2018   Procedure: LEFT HEART CATH AND CORONARY ANGIOGRAPHY;  Surgeon: Martinique, Peter M, MD;  Location: [facility];  Service: Cardiovascular;  Laterality: N/A;  . LUMBAR [place]  . MELANOMA EXCISION Right     "inside my nose"  . TONSILLECTOMY    . TOTAL HIP ARTHROPLASTY Right 07/29/2016   Procedure: TOTAL HIP ARTHROPLASTY;  Surgeon: Garald Balding, MD;  Location: [facility];  Service: Orthopedics;  Laterality: Right;    Social History   Socioeconomic History  . Marital status: Married    Spouse name: Not on file  . Number of children: Not on file  . Years of education: Not on file  . Highest education level: Not on file  Occupational History  . Occupation: Retired  Tobacco Use  . Smoking status: Former Smoker    Packs/day: 1.00    Years: 20.00    Pack years: 20.00    Types: Cigarettes    Start date: 12/30/1943  . Smokeless tobacco: Never Used  . Tobacco comment: "stopped smoking in 1970s when I got pneumonia"  Substance and Sexual Activity  . Alcohol use: Never    Alcohol/week: 0.0 standard drinks  . Drug use: Never  . Sexual activity: Not Currently  Other Topics Concern  . Not on file  Social History Narrative  . Not on file   Social Determinants of Health   Financial Resource Strain:   . Difficulty of Paying Living Expenses:   Food Insecurity:   . Worried About Charity fundraiser in the Last Year:   . Arboriculturist in the Last Year:   Transportation Needs:   . Film/video editor (Medical):   Marland Kitchen Lack of Transportation (Non-Medical):   Physical Activity:   . Days of Exercise per Week:   . Minutes of Exercise per Session:   Stress:   . Feeling of Stress :   Social Connections:   . Frequency of Communication with Friends and Family:   . Frequency of Social Gatherings with Friends and Family:   . Attends Religious Services:   . Active Member of Clubs or Organizations:   . Attends Archivist Meetings:   Marland Kitchen Marital Status:   Intimate Partner Violence:   . Fear of Current or Ex-Partner:   . Emotionally Abused:   Marland Kitchen Physically Abused:   . Sexually Abused:      Vitals:   04/12/20 1348  BP: 126/76  Pulse: 82  SpO2: 93%  Weight: 206 lb (93.4 kg)    Height: 6\' 1"  (1.854 m)    Wt Readings from Last 3 Encounters:  04/12/20 206 lb (93.4 kg)  02/29/20 205 lb (93 kg)  09/16/19 203 lb (92.1 kg)     PHYSICAL EXAM General: NAD HEENT: Normal. Neck: No JVD, no thyromegaly. Lungs: Clear to auscultation bilaterally with normal respiratory effort. CV: Regular rate and rhythm, normal S1/S2, no S3/S4, no murmur. No pretibial or periankle edema.  No carotid bruit.   Abdomen: Soft, nontender, no distention.  Neurologic: Alert and oriented.  Psych: Normal affect. Skin: Normal. Musculoskeletal: No gross deformities.      Labs: Lab Results  Component Value Date/Time   K 4.7 03/27/2020 11:48 AM   BUN 16 03/27/2020 11:48 AM   CREATININE 0.94 03/27/2020 11:48 AM   ALT 23 07/21/2016 01:50 PM   HGB 14.2 03/27/2020 11:48 AM     Lipids: No results found for: LDLCALC, LDLDIRECT, CHOL, TRIG, HDL     Prior CV studies:   The following studies were reviewed today:  Echocardiogram 03/13/2020:  1. Left ventricular ejection fraction, by estimation, is 45 to 50%. The  left ventricle has mildly decreased function. The left ventricle  demonstrates global hypokinesis. There is mild left ventricular  hypertrophy of the septal segment. Grade  indeterminate diastolic dysfunction. Elevated left ventricular  end-diastolic pressure.  2. Right ventricular systolic function is normal. The right ventricular  size is normal.  3. The mitral valve is grossly normal. No evidence of mitral valve  regurgitation.  4. The aortic valve is tricuspid. Aortic valve regurgitation is mild. No  aortic stenosis is present.     ASSESSMENT AND PLAN:  1.  Coronary artery disease: Status post CTO PCI of the mid RCA on 07/15/2018.AttemptedCTO PCI of the proximal left circumflex was unsuccessfuldue to inability to cross with a wire.  Symptomatically stable.  Continue ASA, Toprol-XL, Imdur 30 mg, and simvastatin.  LVEF mildly reduced at 45 to 50% (LVEF 50 to  55% by echocardiogram on 05/10/2018).  2.  Hypertension: Blood pressure is normal.  No changes to therapy.  3. Hyperlipidemia: Continue simvastatin 40 mg.  LDL 60 on 11/09/2019.  4.Carotid artery disease: Less than 50% ICA stenosis bilaterally by Dopplers on 03/13/2020.  Continue statin.  5.  Interstitial lung disease: This was seen by high-resolution chest CT.  I have already made a referral to pulmonary.  He will likely need pulmonary function testing.     Disposition: Follow up 6 months OV   Kate Sable, M.D., F.A.C.C.

## 2020-04-12 NOTE — Patient Instructions (Signed)
Medication Instructions:  Your physician recommends that you continue on your current medications as directed. Please refer to the Current Medication list given to you today.  *If you need a refill on your cardiac medications before your next appointment, please call your pharmacy*   Lab Work: None today If you have labs (blood work) drawn today and your tests are completely normal, you will receive your results only by: . MyChart Message (if you have MyChart) OR . A paper copy in the mail If you have any lab test that is abnormal or we need to change your treatment, we will call you to review the results.   Testing/Procedures: None today   Follow-Up: At [facility], you and your health needs are our priority.  As part of our continuing mission to provide you with exceptional heart care, we have created designated Provider Care Teams.  These Care Teams include your primary Cardiologist (physician) and Advanced Practice Providers (APPs -  Physician Assistants and Nurse Practitioners) who all work together to provide you with the care you need, when you need it.  We recommend signing up for the patient portal called "MyChart".  Sign up information is provided on this After Visit Summary.  MyChart is used to connect with patients for Virtual Visits (Telemedicine).  Patients are able to view lab/test results, encounter notes, upcoming appointments, etc.  Non-urgent messages can be sent to your provider as well.   To learn more about what you can do with MyChart, go to [email or web address].    Your next appointment:   6 month(s)  The format for your next appointment:   In Person  Provider:   Suresh Koneswaran, MD   Other Instructions None      Thank you for choosing [facility] !         

## 2020-05-14 ENCOUNTER — Other Ambulatory Visit: Payer: Self-pay | Admitting: Cardiology

## 2020-05-16 ENCOUNTER — Other Ambulatory Visit (HOSPITAL_COMMUNITY)
Admission: RE | Admit: 2020-05-16 | Discharge: 2020-05-16 | Disposition: A | Discharge status: Home / Self Care | Payer: Medicare Other | Source: Ambulatory Visit | Attending: Internal Medicine | Admitting: Internal Medicine

## 2020-05-16 ENCOUNTER — Other Ambulatory Visit: Payer: Self-pay

## 2020-05-16 ENCOUNTER — Encounter: Payer: Self-pay | Admitting: Internal Medicine

## 2020-05-16 ENCOUNTER — Ambulatory Visit (INDEPENDENT_AMBULATORY_CARE_PROVIDER_SITE_OTHER): Payer: Medicare Other | Admitting: Internal Medicine

## 2020-05-16 VITALS — BP 112/70 | HR 82 | Temp 98.0°F | Ht 72.0 in | Wt 202.0 lb

## 2020-05-16 DIAGNOSIS — E1159 Type 2 diabetes mellitus with other circulatory complications: Secondary | ICD-10-CM | POA: Diagnosis not present

## 2020-05-16 DIAGNOSIS — J84112 Idiopathic pulmonary fibrosis: Secondary | ICD-10-CM | POA: Insufficient documentation

## 2020-05-16 DIAGNOSIS — I25118 Atherosclerotic heart disease of native coronary artery with other forms of angina pectoris: Secondary | ICD-10-CM

## 2020-05-16 DIAGNOSIS — I2510 Atherosclerotic heart disease of native coronary artery without angina pectoris: Secondary | ICD-10-CM | POA: Diagnosis not present

## 2020-05-16 DIAGNOSIS — Z79899 Other long term (current) drug therapy: Secondary | ICD-10-CM | POA: Diagnosis not present

## 2020-05-16 DIAGNOSIS — I10 Essential (primary) hypertension: Secondary | ICD-10-CM | POA: Diagnosis not present

## 2020-05-16 DIAGNOSIS — E785 Hyperlipidemia, unspecified: Secondary | ICD-10-CM | POA: Diagnosis not present

## 2020-05-16 LAB — SEDIMENTATION RATE: Sed Rate: 17 mm/hr — ABNORMAL HIGH (ref 0–16)

## 2020-05-16 NOTE — Patient Instructions (Signed)
Make sure you check your oxygen saturations (by pulse oximeter)  at highest level of activity to be sure it stays over 90% and call if you are trending down over time     Please remember to go to the lab department   for your tests - we will call you with the results when they are available.      Please schedule a follow up office visit in 4- 6 weeks, call sooner if needed with pfts on return

## 2020-05-16 NOTE — Assessment & Plan Note (Addendum)
HRCT CHEST 03/28/20 The appearance of the lungs is compatible with interstitial lung disease, with a spectrum of findings considered probable interstitial pneumonia (UIP) per current ATS guidelines. -  05/16/2020   Walked RA  2 laps @ approx 300 ft @ slow pace  stopped due to end of study, sats still 95% with mild sob  - serology 05/16/2020 :  Lab Results  Component Value Date   ESRSEDRATE 17 (H) 05/16/2020   ESRSEDRATE 7 10/11/2009    DDx for pulmonary fibrosis  includes idiopathic pulmonary fibrosis, pulmonary fibrosis associated with rheumatologic diseases (which have a relatively benign course in most cases) , adverse effect from  drugs (which I could not identify) such as chemotherapy or amiodarone exposure (none used), nonspecific interstitial pneumonia which is typically steroid responsive, and chronic hypersensitivity pneumonitis(no risk factors).   In active  smokers Langerhan's Cell  Histiocyctosis (eosinophilic granuomatosis),  DIP,  and Respiratory Bronchiolitis ILD also need to be considered,  But don't likely apply here.  Will check serologies and have him return in 6 weeks with pft's  Discussed in detail all the  indications, usual  risks and alternatives  relative to the benefits with patient who agrees to proceed with w/u as outlined.            Each maintenance medication was reviewed in detail including emphasizing most importantly the difference between maintenance and prns and under what circumstances the prns are to be triggered using an action plan format where appropriate.  Total time for H and P, chart review, counseling,  directly observing portions of ambulatory 02 saturation study/  and generating customized AVS unique to this office visit / charting = 60 min

## 2020-05-16 NOTE — Progress Notes (Signed)
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 ILD ? First seen on cxr 02/10/2013      History of Present Illness  05/16/2020  Pulmonary/ 1st office eval/Pearlean Sabina  Chief Complaint  Patient presents with  . Pulmonary Consult    Referred by Dr. Jacinta Shoe. Pt c/o DOE x 3 years. He gets winded walking approx 200 yards.   Dyspnea:  5-10 min walking flat/ slow  Cough: reproducible with deep breath Sleep: able to lie flat / no pillows  SABA use: none    No obvious day to day or daytime variability or assoc excess/ purulent sputum or mucus plugs or hemoptysis or cp or chest tightness, subjective wheeze or overt sinus or hb symptoms.   Sleeping as above  without nocturnal  or early am exacerbation  of respiratory  c/o's or need for noct saba. Also denies any obvious fluctuation of symptoms with weather or environmental changes or other aggravating or alleviating factors except as outlined above   No unusual exposure hx or h/o childhood pna/ asthma or knowledge of premature birth.  Current Allergies, Complete Past Medical History, Past Surgical History, Family History, and Social History were reviewed in [employer] record.  ROS  The following are not active complaints unless bolded Hoarseness, sore throat, dysphagia, dental problems, itching, sneezing,  nasal congestion or discharge of excess mucus or purulent secretions, ear ache,   fever, chills, sweats, unintended wt loss or wt gain, classically pleuritic or exertional cp,  orthopnea pnd or arm/hand swelling  or leg swelling, presyncope, palpitations, abdominal pain, anorexia, nausea, vomiting, diarrhea  or change  in bowel habits or change in bladder habits, change in stools or change in urine, dysuria, hematuria,  rash, arthralgias, visual complaints, headache, numbness, weakness or ataxia or problems with walking or coordination,  change in mood or  memory.           Past Medical History:  Diagnosis Date  . Anxiety   . Arthritis    "right hip" (07/15/2018)  . Carotid artery disease ([place])    a. <50% bilaterally 05/2016.  Marland Kitchen Chronic combined systolic and diastolic CHF (congestive heart failure) ([place])   . Complication of anesthesia    "didn't wake up well after OR on 07/30/2016; took 2-3 to hold me down"  . Coronary artery disease    a. prior MI/stent around 2000. b. Nuc 09/2015 -> abnl, mgmd medically.  . Facial neuralgia   . History of blood transfusion 07/30/2016   "after the operation"  . Hyperlipidemia   . Hypertension   . Ischemic cardiomyopathy   . Melanoma of nose ([place])    "inside on the right side"  . Myocardial infarction ([place]) ~ 2000  . Pneumonia 1935- 1970s ,"several times"  . Rocky Mountain spotted fever 1942  . Skin cancer    "on my back; arms; froze them off"  . Type 2 diabetes, diet controlled ([place])     Outpatient Medications Prior to Visit  Medication Sig Dispense Refill  . amitriptyline (ELAVIL) 50 MG tablet Take 50 mg by mouth at bedtime.    Marland Kitchen aspirin EC  81 MG tablet Take 81 mg by mouth daily.    . clopidogrel (PLAVIX) 75 MG tablet TAKE 1 TABLET BY MOUTH ONCE DAILY. 90 tablet 1  . isosorbide mononitrate (IMDUR) 30 MG 24 hr tablet Take 1 tablet (30 mg total) by mouth daily. 90 tablet 3  . losartan (COZAAR) 25 MG tablet Take 0.5 tablets (12.5 mg total) by mouth daily. 45 tablet 3  . metoprolol succinate (TOPROL-XL) 25 MG 24 hr tablet Take 25 mg by mouth daily.    Marland Kitchen MURO 128 2 % ophthalmic solution     . nitroGLYCERIN (NITROSTAT) 0.4 MG SL tablet Place 1 tablet (0.4 mg total) under the tongue every 5 (five) minutes as needed for chest pain. 25 tablet 2  . simvastatin (ZOCOR)  40 MG tablet Take 40 mg by mouth every evening.            Objective:     BP 112/70 (BP Location: Left Arm, Cuff Size: Normal)   Pulse 82   Temp 98 F (36.7 C) (Temporal)   Ht 6' (1.829 m)   Wt 202 lb (91.6 kg)   SpO2 96% Comment: on RA  BMI 27.40 kg/m   SpO2: 96 %(on RA)   Pleasant amb wm nad   HEENT : pt wearing mask not removed for exam due to covid -19 concerns.    NECK :  without JVD/Nodes/TM/ nl carotid upstrokes bilaterally   LUNGS: no acc muscle use,  Nl contour chest with bilateral coarse insp crackles  With  cough on insp   maneuvers   CV:  RRR  no s3 or murmur or increase in P2, and no edema   ABD:  soft and nontender with nl inspiratory excursion in the supine position. No bruits or organomegaly appreciated, bowel sounds nl  MS:  Nl gait/ ext warm without deformities, calf tenderness, cyanosis or clubbing No obvious joint restrictions   SKIN: warm and dry without lesions    NEURO:  alert, approp, nl sensorium with  no motor or cerebellar deficits apparent.     I personally reviewed images and agree with radiology impression as follows:   Chest HRCT  03/28/20 The appearance of the lungs is compatible with interstitial lung disease, with a spectrum of findings considered probable interstitial pneumonia (UIP) per current ATS guidelines.      Assessment   IPF (idiopathic pulmonary fibrosis) ([facility]) HRCT CHEST 03/28/20 The appearance of the lungs is compatible with interstitial lung disease, with a spectrum of findings considered probable interstitial pneumonia (UIP) per current ATS guidelines. -  05/16/2020   Walked RA  2 laps @ approx 300 ft @ slow pace  stopped due to end of study, sats still 95% with mild sob  - serology 05/16/2020 :  Lab Results  Component Value Date   ESRSEDRATE 17 (H) 05/16/2020   ESRSEDRATE 7 10/11/2009    DDx for pulmonary fibrosis  includes idiopathic pulmonary fibrosis, pulmonary fibrosis associated with rheumatologic  diseases (which have a relatively benign course in most cases) , adverse effect from  drugs (which I could not identify) such as chemotherapy or amiodarone exposure (none used), nonspecific interstitial pneumonia which is typically steroid responsive, and chronic hypersensitivity pneumonitis(no risk factors).   In active  smokers Langerhan's Cell  Histiocyctosis (eosinophilic granuomatosis),  DIP,  and Respiratory Bronchiolitis ILD also need to be considered,  But don't likely apply here.  Will check serologies and have him return in 6 weeks with pft's  Discussed in detail all  the  indications, usual  risks and alternatives  relative to the benefits with patient who agrees to proceed with w/u as outlined.            Each maintenance medication was reviewed in detail including emphasizing most importantly the difference between maintenance and prns and under what circumstances the prns are to be triggered using an action plan format where appropriate.  Total time for H and P, chart review, counseling,  directly observing portions of ambulatory 02 saturation study/  and generating customized AVS unique to this office visit / charting = 60 min            Christinia Gully, MD 05/16/2020

## 2020-05-17 LAB — CYCLIC CITRUL PEPTIDE ANTIBODY, IGG/IGA: CCP Antibodies IgG/IgA: 4 units (ref 0–19)

## 2020-05-17 LAB — RHEUMATOID FACTOR: Rheumatoid fact SerPl-aCnc: <10 IU/mL (ref 0.0–13.9)

## 2020-05-17 LAB — ANA: Anti Nuclear Antibody (ANA): NEGATIVE

## 2020-05-21 LAB — HYPERSENSITIVITY PNEUMONITIS
A. Pullulans Abs: NEGATIVE
A.Fumigatus #1 Abs: NEGATIVE
Micropolyspora faeni, IgG: NEGATIVE
Pigeon Serum Abs: NEGATIVE
Thermoact. Saccharii: NEGATIVE
Thermoactinomyces vulgaris, IgG: NEGATIVE

## 2020-05-22 NOTE — Progress Notes (Signed)
Left detailed msg ok per DPR

## 2020-05-23 DIAGNOSIS — G500 Trigeminal neuralgia: Secondary | ICD-10-CM | POA: Diagnosis not present

## 2020-05-23 DIAGNOSIS — E1129 Type 2 diabetes mellitus with other diabetic kidney complication: Secondary | ICD-10-CM | POA: Diagnosis not present

## 2020-05-23 DIAGNOSIS — I10 Essential (primary) hypertension: Secondary | ICD-10-CM | POA: Diagnosis not present

## 2020-06-22 ENCOUNTER — Other Ambulatory Visit: Payer: Self-pay

## 2020-06-22 ENCOUNTER — Other Ambulatory Visit (HOSPITAL_COMMUNITY)
Admission: RE | Admit: 2020-06-22 | Discharge: 2020-06-22 | Disposition: A | Discharge status: Home / Self Care | Payer: Medicare Other | Source: Ambulatory Visit | Attending: Internal Medicine | Admitting: Internal Medicine

## 2020-06-22 DIAGNOSIS — Z01812 Encounter for preprocedural laboratory examination: Secondary | ICD-10-CM | POA: Insufficient documentation

## 2020-06-22 DIAGNOSIS — Z20822 Contact with and (suspected) exposure to covid-19: Secondary | ICD-10-CM | POA: Diagnosis not present

## 2020-06-23 LAB — SARS CORONAVIRUS 2 (TAT 6-24 HRS): SARS Coronavirus 2: NEGATIVE

## 2020-06-26 ENCOUNTER — Other Ambulatory Visit: Payer: Self-pay

## 2020-06-26 ENCOUNTER — Ambulatory Visit (HOSPITAL_COMMUNITY)
Admission: RE | Admit: 2020-06-26 | Discharge: 2020-06-26 | Disposition: A | Discharge status: Home / Self Care | Payer: Medicare Other | Source: Ambulatory Visit | Attending: Internal Medicine | Admitting: Internal Medicine

## 2020-06-26 DIAGNOSIS — J84112 Idiopathic pulmonary fibrosis: Secondary | ICD-10-CM | POA: Insufficient documentation

## 2020-06-26 LAB — PULMONARY FUNCTION TEST
DL/VA % pred: 76 %
DL/VA: 2.89 ml/min/mmHg/L
DLCO unc % pred: 50 %
DLCO unc: 12.58 ml/min/mmHg
FEF 25-75 Post: 2.49 L/sec
FEF 25-75 Pre: 2.6 L/sec
FEF2575-%Change-Post: -4 %
FEF2575-%Pred-Post: 133 %
FEF2575-%Pred-Pre: 139 %
FEV1-%Change-Post: 0 %
FEV1-%Pred-Post: 92 %
FEV1-%Pred-Pre: 91 %
FEV1-Post: 2.66 L
FEV1-Pre: 2.64 L
FEV1FVC-%Change-Post: 1 %
FEV1FVC-%Pred-Pre: 115 %
FEV6-%Change-Post: 0 %
FEV6-%Pred-Post: 83 %
FEV6-%Pred-Pre: 84 %
FEV6-Post: 3.21 L
FEV6-Pre: 3.21 L
FEV6FVC-%Change-Post: 0 %
FEV6FVC-%Pred-Post: 106 %
FEV6FVC-%Pred-Pre: 105 %
FVC-%Change-Post: -1 %
FVC-%Pred-Post: 78 %
FVC-%Pred-Pre: 79 %
FVC-Post: 3.22 L
FVC-Pre: 3.26 L
Post FEV1/FVC ratio: 82 %
Post FEV6/FVC ratio: 99 %
Pre FEV1/FVC ratio: 81 %
Pre FEV6/FVC Ratio: 99 %
RV % pred: 48 %
RV: 1.39 L
TLC % pred: 63 %
TLC: 4.73 L

## 2020-06-26 MED ORDER — ALBUTEROL SULFATE (2.5 MG/3ML) 0.083% IN NEBU
2.5000 mg | INHALATION_SOLUTION | Freq: Once | RESPIRATORY_TRACT | Status: AC
  Administered 2020-06-26: 2.5 mg via RESPIRATORY_TRACT

## 2020-06-27 ENCOUNTER — Ambulatory Visit: Payer: Medicare Other | Admitting: Internal Medicine

## 2020-06-28 ENCOUNTER — Ambulatory Visit (INDEPENDENT_AMBULATORY_CARE_PROVIDER_SITE_OTHER): Payer: Medicare Other | Admitting: Internal Medicine

## 2020-06-28 ENCOUNTER — Encounter: Payer: Self-pay | Admitting: Internal Medicine

## 2020-06-28 ENCOUNTER — Other Ambulatory Visit: Payer: Self-pay

## 2020-06-28 DIAGNOSIS — I25118 Atherosclerotic heart disease of native coronary artery with other forms of angina pectoris: Secondary | ICD-10-CM | POA: Diagnosis not present

## 2020-06-28 DIAGNOSIS — J84112 Idiopathic pulmonary fibrosis: Secondary | ICD-10-CM | POA: Diagnosis not present

## 2020-06-28 DIAGNOSIS — R29898 Other symptoms and signs involving the musculoskeletal system: Secondary | ICD-10-CM | POA: Diagnosis not present

## 2020-06-28 DIAGNOSIS — I9581 Postprocedural hypotension: Secondary | ICD-10-CM | POA: Diagnosis not present

## 2020-06-28 NOTE — Patient Instructions (Addendum)
Make sure you check your oxygen saturations at highest level of activity to be sure it stays over 90% and call if losing ground.  We will be referring you to a neurologist in [place] re dizzy spells and leg weakness    Please schedule a follow up visit in 6 months but call sooner if needed with pfts and cxr same day  Add rec trial off elavil x 3 weeks for ? Orthostatic dizzy spells

## 2020-06-28 NOTE — Progress Notes (Signed)
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 ILD ? First seen on cxr 02/10/2013     History of Present Illness  05/16/2020  Pulmonary/ 1st office eval/Jimmy Knox  Chief Complaint  Patient presents with  . Pulmonary Consult    Referred by Dr. Jacinta Knox. Pt c/o DOE x 3 years. He gets winded walking approx 200 yards.   Dyspnea:  5-10 min walking flat/ slow  Cough: reproducible with deep breath Sleep: able to lie flat / no pillows  SABA use: none  rec Make sure you check your oxygen saturations (by pulse oximeter)  at highest level of activity to be sure it stays over 90% and call if you are trending down over time   Please remember to go to the lab department   for your tests - we will call you with the results when they are available.     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 neuopathy Chief Complaint  Patient presents with  . Follow-up    PFT done 06/26/20. Breathing is unchanged since the last visit.   Dyspnea:  No change doe 5-10 min has not tried pacing but usually sats 95% when has to stop/also limited by "unsteady on feet" with neg cards w/u to date. Cough: none  Sleeping:flat/ no pillows  SABA use: none  02: none    No obvious day to day or daytime variability or assoc excess/ purulent sputum or mucus plugs or hemoptysis or cp or chest tightness, subjective wheeze or overt sinus or hb symptoms.   Sleeping fine  without nocturnal  or early am exacerbation  of respiratory  c/o's or need for noct saba. Also denies any obvious fluctuation of symptoms with weather or environmental changes or other aggravating or alleviating factors except as outlined above   No  unusual exposure hx or h/o childhood pna/ asthma or knowledge of premature birth.  Current Allergies, Complete Past Medical History, Past Surgical History, Family History, and Social History were reviewed in [employer] record.  ROS  The following are not active complaints unless bolded Hoarseness, sore throat, dysphagia, dental problems, itching, sneezing,  nasal congestion or discharge of excess mucus or purulent secretions, ear ache,   fever, chills, sweats, unintended wt loss or wt gain, classically pleuritic or exertional cp,  orthopnea pnd or arm/hand swelling  or leg swelling, presyncope, palpitations, abdominal pain, anorexia, nausea, vomiting, diarrhea  or change in bowel habits or change in bladder habits, change in stools or change in urine, dysuria, hematuria,  rash, arthralgias, visual complaints, headache, numbness, weakness or ataxia or problems with walking or coordination,  change in mood or  memory.        Current Meds  Medication Sig  . amitriptyline (ELAVIL) 50 MG tablet Take 50 mg by mouth at bedtime.  Marland Kitchen aspirin EC 81 MG tablet Take 81 mg by mouth daily.  . clopidogrel (PLAVIX) 75 MG tablet TAKE 1 TABLET BY MOUTH ONCE DAILY.  . metoprolol succinate (TOPROL-XL) 25 MG 24 hr tablet Take 25 mg by mouth daily.  Marland Kitchen MURO 128 2 % ophthalmic  solution   . simvastatin (ZOCOR) 40 MG tablet Take 40 mg by mouth every evening.                     Past Medical History:  Diagnosis Date  . Anxiety   . Arthritis    "right hip" (07/15/2018)  . Carotid artery disease ([place])    a. <50% bilaterally 05/2016.  Marland Kitchen Chronic combined systolic and diastolic CHF (congestive heart failure) ([place])   . Complication of anesthesia    "didn't wake up well after OR on 07/30/2016; took 2-3 to hold me down"  . Coronary artery disease    a. prior MI/stent around 2000. b. Nuc 09/2015 -> abnl, mgmd medically.  . Facial neuralgia   . History of blood transfusion 07/30/2016   "after the  operation"  . Hyperlipidemia   . Hypertension   . Ischemic cardiomyopathy   . Melanoma of nose ([place])    "inside on the right side"  . Myocardial infarction ([place]) ~ 2000  . Pneumonia 1935- 1970s ,"several times"  . Rocky Mountain spotted fever 1942  . Skin cancer    "on my back; arms; froze them off"  . Type 2 diabetes, diet controlled ([facility])       Objective:     Wt Readings from Last 3 Encounters:  05/16/20 202 lb (91.6 kg)  04/12/20 206 lb (93.4 kg)  02/29/20 205 lb (93 kg)     Vital signs reviewed - Note on arrival 02 sats  96% on RA     Elderly wm / looks unsteady on feet/ awkward gait/ walks with knees bent    HEENT : pt wearing mask not removed for exam due to covid -19 concerns.    NECK :  without JVD/Nodes/TM/ nl carotid upstrokes bilaterally   LUNGS: no acc muscle use,  Nl contour chest with min insp crackles bases  bilaterally without cough on insp or exp maneuvers   CV:  RRR  no s3 or murmur or increase in P2, and no edema   ABD:  soft and nontender with nl inspiratory excursion in the supine position. No bruits or organomegaly appreciated, bowel sounds nl  MS:    ext warm without deformities, calf tenderness, cyanosis or clubbing No obvious joint restrictions   SKIN: warm and dry without lesions    NEURO:  alert, approp, nl sensorium with  no  r cerebellar deficits apparent.   He uses his arms to get out of a chair (last 6 months per hx) and then walks with knees bent and somewhat wobbly with Pos Rhom[place]           Assessment

## 2020-06-29 ENCOUNTER — Encounter: Payer: Self-pay | Admitting: Internal Medicine

## 2020-06-29 NOTE — Assessment & Plan Note (Signed)
Referred to neurology 06/28/2020   -  Exam is c/w neuromuscular weakness ? Polyneuropathy > eval strongly rec asap   F/u here in 6 m, sooner if needed          Medical decision making was a moderate level of complexity in this case because of  two chronic conditions /diagnoses (one of them new and not dx yet) requiring extra time for  H and P, chart review, counseling,  and generating customized AVS unique to this office visit and charting.   Each maintenance medication was reviewed in detail including emphasizing most importantly the difference between maintenance and prns and under what circumstances the prns are to be triggered using an action plan format where appropriate. Please see avs for details which were reviewed in writing by both me and my nurse and patient given a written copy highlighted where appropriate with yellow highlighter for the patient's continued care at home along with an updated version of their medications.  Patient was asked to maintain medication reconciliation by comparing this list to the actual medications being used at home and to contact this office right away if there is a conflict or discrepancy.

## 2020-06-29 NOTE — Assessment & Plan Note (Addendum)
By hx but not documented at [facility] 06/28/2020   >>>> Note he's on elavil, one of the most common medication causes for postural hypotension and rec  trial off x 3 weeks to see improves and if not ok to resume it.

## 2020-06-29 NOTE — Assessment & Plan Note (Addendum)
Onset of symptoms 2018 HRCT CHEST 03/28/20 The appearance of the lungs is compatible with interstitial lung disease, with a spectrum of findings considered probable interstitial pneumonia (UIP) per current ATS guidelines. -  05/16/2020   Walked RA  2 laps @ approx 300 ft @ slow pace  stopped due to end of study, sats still 95% with mild sob  - serology 05/16/2020 :  Neg for collagen vasc dz/ neg HSP serology  Lab Results  Component Value Date   ESRSEDRATE 17 (H) 05/16/2020   ESRSEDRATE 7 10/11/2009  - PFT's  06/26/20  FVC  3.26 (79%) and no obst and  DLCO  12.58 (50%) corrects to 2.89 (76%)  for alv volume and FV curve nl    Discussed the dx probable UIP but dont' think that's actually limiting him in any way and may have been present x 3 years so has a very flat curve in terms of progression so best to just follow for now with serial sats and FVC's   >>> f/u 6 months

## 2020-07-04 ENCOUNTER — Telehealth: Payer: Self-pay | Admitting: *Deleted

## 2020-07-04 NOTE — Telephone Encounter (Signed)
-----   Message from Tanda Rockers, MD sent at 06/29/2020  7:56 PM EDT ----- Call with Rec trial off elavil x 3 weeks to see if helps dizzy spells

## 2020-07-04 NOTE — Telephone Encounter (Signed)
Spoke with the pt and notified of recommendations per Dr Melvyn Novas and he verbalized understanding.

## 2020-07-25 DIAGNOSIS — H47231 Glaucomatous optic atrophy, right eye: Secondary | ICD-10-CM | POA: Diagnosis not present

## 2020-07-25 DIAGNOSIS — H1811 Bullous keratopathy, right eye: Secondary | ICD-10-CM | POA: Diagnosis not present

## 2020-09-18 DIAGNOSIS — R42 Dizziness and giddiness: Secondary | ICD-10-CM | POA: Diagnosis not present

## 2020-09-18 DIAGNOSIS — I959 Hypotension, unspecified: Secondary | ICD-10-CM | POA: Diagnosis not present

## 2020-09-18 DIAGNOSIS — R55 Syncope and collapse: Secondary | ICD-10-CM | POA: Diagnosis not present

## 2020-09-24 DIAGNOSIS — Z85828 Personal history of other malignant neoplasm of skin: Secondary | ICD-10-CM | POA: Diagnosis not present

## 2020-09-24 DIAGNOSIS — Z08 Encounter for follow-up examination after completed treatment for malignant neoplasm: Secondary | ICD-10-CM | POA: Diagnosis not present

## 2020-09-24 DIAGNOSIS — C44519 Basal cell carcinoma of skin of other part of trunk: Secondary | ICD-10-CM | POA: Diagnosis not present

## 2020-09-24 DIAGNOSIS — C44619 Basal cell carcinoma of skin of left upper limb, including shoulder: Secondary | ICD-10-CM | POA: Diagnosis not present

## 2020-09-24 DIAGNOSIS — C44719 Basal cell carcinoma of skin of left lower limb, including hip: Secondary | ICD-10-CM | POA: Diagnosis not present

## 2020-09-24 DIAGNOSIS — L570 Actinic keratosis: Secondary | ICD-10-CM | POA: Diagnosis not present

## 2020-09-24 DIAGNOSIS — X32XXXD Exposure to sunlight, subsequent encounter: Secondary | ICD-10-CM | POA: Diagnosis not present

## 2020-09-28 ENCOUNTER — Ambulatory Visit: Payer: Medicare Other | Admitting: Cardiovascular Disease

## 2020-10-02 DIAGNOSIS — R42 Dizziness and giddiness: Secondary | ICD-10-CM | POA: Diagnosis not present

## 2020-10-05 DIAGNOSIS — Z23 Encounter for immunization: Secondary | ICD-10-CM | POA: Diagnosis not present

## 2020-10-23 DIAGNOSIS — Z23 Encounter for immunization: Secondary | ICD-10-CM | POA: Diagnosis not present

## 2020-11-01 DIAGNOSIS — K5904 Chronic idiopathic constipation: Secondary | ICD-10-CM | POA: Diagnosis not present

## 2020-11-01 DIAGNOSIS — R42 Dizziness and giddiness: Secondary | ICD-10-CM | POA: Diagnosis not present

## 2020-11-05 DIAGNOSIS — L570 Actinic keratosis: Secondary | ICD-10-CM | POA: Diagnosis not present

## 2020-11-05 DIAGNOSIS — X32XXXD Exposure to sunlight, subsequent encounter: Secondary | ICD-10-CM | POA: Diagnosis not present

## 2020-11-05 DIAGNOSIS — Z08 Encounter for follow-up examination after completed treatment for malignant neoplasm: Secondary | ICD-10-CM | POA: Diagnosis not present

## 2020-11-05 DIAGNOSIS — Z85828 Personal history of other malignant neoplasm of skin: Secondary | ICD-10-CM | POA: Diagnosis not present

## 2020-11-20 DIAGNOSIS — J8410 Pulmonary fibrosis, unspecified: Secondary | ICD-10-CM | POA: Diagnosis not present

## 2020-11-20 DIAGNOSIS — G500 Trigeminal neuralgia: Secondary | ICD-10-CM | POA: Diagnosis not present

## 2020-11-20 DIAGNOSIS — E1129 Type 2 diabetes mellitus with other diabetic kidney complication: Secondary | ICD-10-CM | POA: Diagnosis not present

## 2020-11-20 DIAGNOSIS — Z79899 Other long term (current) drug therapy: Secondary | ICD-10-CM | POA: Diagnosis not present

## 2020-11-20 DIAGNOSIS — I10 Essential (primary) hypertension: Secondary | ICD-10-CM | POA: Diagnosis not present

## 2020-11-27 DIAGNOSIS — E785 Hyperlipidemia, unspecified: Secondary | ICD-10-CM | POA: Diagnosis not present

## 2020-11-27 DIAGNOSIS — J84113 Idiopathic non-specific interstitial pneumonitis: Secondary | ICD-10-CM | POA: Diagnosis not present

## 2020-11-27 DIAGNOSIS — R7309 Other abnormal glucose: Secondary | ICD-10-CM | POA: Diagnosis not present

## 2020-11-27 DIAGNOSIS — I10 Essential (primary) hypertension: Secondary | ICD-10-CM | POA: Diagnosis not present

## 2020-11-27 DIAGNOSIS — I2510 Atherosclerotic heart disease of native coronary artery without angina pectoris: Secondary | ICD-10-CM | POA: Diagnosis not present

## 2020-11-27 DIAGNOSIS — Z6827 Body mass index (BMI) 27.0-27.9, adult: Secondary | ICD-10-CM | POA: Diagnosis not present

## 2020-11-27 DIAGNOSIS — Z0001 Encounter for general adult medical examination with abnormal findings: Secondary | ICD-10-CM | POA: Diagnosis not present

## 2020-11-27 DIAGNOSIS — E1100 Type 2 diabetes mellitus with hyperosmolarity without nonketotic hyperglycemic-hyperosmolar coma (NKHHC): Secondary | ICD-10-CM | POA: Diagnosis not present

## 2021-01-25 DIAGNOSIS — H1811 Bullous keratopathy, right eye: Secondary | ICD-10-CM | POA: Diagnosis not present

## 2021-01-25 DIAGNOSIS — H47231 Glaucomatous optic atrophy, right eye: Secondary | ICD-10-CM | POA: Diagnosis not present

## 2021-02-19 DIAGNOSIS — M6281 Muscle weakness (generalized): Secondary | ICD-10-CM | POA: Diagnosis not present

## 2021-02-19 DIAGNOSIS — R42 Dizziness and giddiness: Secondary | ICD-10-CM | POA: Diagnosis not present

## 2021-03-11 ENCOUNTER — Emergency Department (HOSPITAL_COMMUNITY): Payer: Medicare Other

## 2021-03-11 ENCOUNTER — Encounter (HOSPITAL_COMMUNITY): Payer: Self-pay

## 2021-03-11 ENCOUNTER — Other Ambulatory Visit: Payer: Self-pay

## 2021-03-11 ENCOUNTER — Emergency Department (HOSPITAL_COMMUNITY)
Admission: EM | Admit: 2021-03-11 | Discharge: 2021-03-11 | Disposition: A | Discharge status: Home / Self Care | Payer: Medicare Other | Attending: Emergency Medicine | Admitting: Emergency Medicine

## 2021-03-11 DIAGNOSIS — I110 Hypertensive heart disease with heart failure: Secondary | ICD-10-CM | POA: Insufficient documentation

## 2021-03-11 DIAGNOSIS — R1030 Lower abdominal pain, unspecified: Secondary | ICD-10-CM | POA: Diagnosis not present

## 2021-03-11 DIAGNOSIS — Z96641 Presence of right artificial hip joint: Secondary | ICD-10-CM | POA: Insufficient documentation

## 2021-03-11 DIAGNOSIS — I5042 Chronic combined systolic (congestive) and diastolic (congestive) heart failure: Secondary | ICD-10-CM | POA: Insufficient documentation

## 2021-03-11 DIAGNOSIS — Z7902 Long term (current) use of antithrombotics/antiplatelets: Secondary | ICD-10-CM | POA: Diagnosis not present

## 2021-03-11 DIAGNOSIS — Z951 Presence of aortocoronary bypass graft: Secondary | ICD-10-CM | POA: Insufficient documentation

## 2021-03-11 DIAGNOSIS — Z87891 Personal history of nicotine dependence: Secondary | ICD-10-CM | POA: Diagnosis not present

## 2021-03-11 DIAGNOSIS — E119 Type 2 diabetes mellitus without complications: Secondary | ICD-10-CM | POA: Diagnosis not present

## 2021-03-11 DIAGNOSIS — Z7982 Long term (current) use of aspirin: Secondary | ICD-10-CM | POA: Insufficient documentation

## 2021-03-11 DIAGNOSIS — Z79899 Other long term (current) drug therapy: Secondary | ICD-10-CM | POA: Insufficient documentation

## 2021-03-11 DIAGNOSIS — Z85828 Personal history of other malignant neoplasm of skin: Secondary | ICD-10-CM | POA: Diagnosis not present

## 2021-03-11 DIAGNOSIS — I25119 Atherosclerotic heart disease of native coronary artery with unspecified angina pectoris: Secondary | ICD-10-CM | POA: Insufficient documentation

## 2021-03-11 DIAGNOSIS — R109 Unspecified abdominal pain: Secondary | ICD-10-CM | POA: Diagnosis not present

## 2021-03-11 LAB — CBC WITH DIFFERENTIAL/PLATELET
Abs Immature Granulocytes: 0.04 10*3/uL (ref 0.00–0.07)
Basophils Absolute: 0.1 10*3/uL (ref 0.0–0.1)
Basophils Relative: 1 %
Eosinophils Absolute: 0.2 10*3/uL (ref 0.0–0.5)
Eosinophils Relative: 1 %
HCT: 49.2 % (ref 39.0–52.0)
Hemoglobin: 16.1 g/dL (ref 13.0–17.0)
Immature Granulocytes: 0 %
Lymphocytes Relative: 30 %
Lymphs Abs: 3.8 10*3/uL (ref 0.7–4.0)
MCH: 31.2 pg (ref 26.0–34.0)
MCHC: 32.7 g/dL (ref 30.0–36.0)
MCV: 95.3 fL (ref 80.0–100.0)
Monocytes Absolute: 0.9 10*3/uL (ref 0.1–1.0)
Monocytes Relative: 7 %
Neutro Abs: 7.4 10*3/uL (ref 1.7–7.7)
Neutrophils Relative %: 61 %
Platelets: 216 10*3/uL (ref 150–400)
RBC: 5.16 MIL/uL (ref 4.22–5.81)
RDW: 13.3 % (ref 11.5–15.5)
WBC: 12.5 10*3/uL — ABNORMAL HIGH (ref 4.0–10.5)
nRBC: 0 % (ref 0.0–0.2)

## 2021-03-11 LAB — COMPREHENSIVE METABOLIC PANEL
ALT: 20 U/L (ref 0–44)
AST: 22 U/L (ref 15–41)
Albumin: 3.9 g/dL (ref 3.5–5.0)
Alkaline Phosphatase: 79 U/L (ref 38–126)
Anion gap: 10 (ref 5–15)
BUN: 17 mg/dL (ref 8–23)
CO2: 24 mmol/L (ref 22–32)
Calcium: 9.2 mg/dL (ref 8.9–10.3)
Chloride: 101 mmol/L (ref 98–111)
Creatinine, Ser: 1 mg/dL (ref 0.61–1.24)
GFR, Estimated: >60 mL/min (ref >60)
Glucose, Bld: 146 mg/dL — ABNORMAL HIGH (ref 70–99)
Potassium: 4.1 mmol/L (ref 3.5–5.1)
Sodium: 135 mmol/L (ref 135–145)
Total Bilirubin: 1.1 mg/dL (ref 0.3–1.2)
Total Protein: 7.7 g/dL (ref 6.5–8.1)

## 2021-03-11 LAB — URINALYSIS, ROUTINE W REFLEX MICROSCOPIC
Bacteria, UA: NONE SEEN
Bilirubin Urine: NEGATIVE
Glucose, UA: NEGATIVE mg/dL
Ketones, ur: NEGATIVE mg/dL
Leukocytes,Ua: NEGATIVE
Nitrite: NEGATIVE
Protein, ur: NEGATIVE mg/dL
Specific Gravity, Urine: 1.042 — ABNORMAL HIGH (ref 1.005–1.030)
pH: 6 (ref 5.0–8.0)

## 2021-03-11 LAB — LIPASE, BLOOD: Lipase: 32 U/L (ref 11–51)

## 2021-03-11 MED ORDER — IOHEXOL 300 MG/ML  SOLN
100.0000 mL | Freq: Once | INTRAMUSCULAR | Status: AC | PRN
  Administered 2021-03-11: 100 mL via INTRAVENOUS

## 2021-03-11 MED ORDER — TRAMADOL HCL 50 MG PO TABS: 50.0000 mg | ORAL_TABLET | Freq: Four times a day (QID) | ORAL | 0 refills | Status: DC | PRN

## 2021-03-11 NOTE — ED Provider Notes (Signed)
[facility] EMERGENCY DEPARTMENT Provider Note   CSN: [id number] Arrival date & time: 03/11/21  0941     History Chief Complaint  Patient presents with  . Abdominal Pain    Jimmy Knox is a 87 y.o. male.  Patient with complaint of abdominal pain mostly in the lower quadrant area.  Is been ongoing for the past 2 days that it has been worse.  But it has been kind of going on for 2 weeks.  No nausea vomiting or diarrhea.  Patient has not had pain like this before.  Past medical history significant for chronic combined systolic diastolic congestive heart failure.  Coronary artery disease.  Hyperlipidemia hypertension.        Past Medical History:  Diagnosis Date  . Anxiety   . Arthritis    "right hip" (07/15/2018)  . Carotid artery disease ([place])    a. <50% bilaterally 05/2016.  Marland Kitchen Chronic combined systolic and diastolic CHF (congestive heart failure) ([place])   . Complication of anesthesia    "didn't wake up well after OR on 07/30/2016; took 2-3 to hold me down"  . Coronary artery disease    a. prior MI/stent around 2000. b. Nuc 09/2015 -> abnl, mgmd medically.  . Facial neuralgia   . History of blood transfusion 07/30/2016   "after the operation"  . Hyperlipidemia   . Hypertension   . Ischemic cardiomyopathy   . Melanoma of nose ([place])    "inside on the right side"  . Myocardial infarction ([place]) ~ 2000  . Pneumonia 1935- 1970s ,"several times"  . Rocky Mountain spotted fever 1942  . Skin cancer    "on my back; arms; froze them off"  . Type 2 diabetes, diet controlled [facility])     Patient Active Problem List   Diagnosis Date Noted  . Leg weakness, bilateral 06/28/2020  . IPF (idiopathic pulmonary fibrosis) ([place]) 05/16/2020  . Bilateral primary osteoarthritis of knee 01/19/2019  . Angina pectoris ([place]) 07/15/2018  . Dyspnea on exertion 06/15/2018  . Primary osteoarthritis of right hip 07/29/2016  . Hypotension 07/29/2016  . Chronic combined systolic and diastolic CHF  (congestive heart failure) ([place]) 07/29/2016  . S/P total hip arthroplasty 07/29/2016  . Post-operative state   . Postprocedural hypotension   . Cardiomyopathy, ischemic 10/12/2013  . Carotid artery stenosis 10/12/2013  . Cerebrovascular disease 02/14/2013  . Arteriosclerotic cardiovascular disease (ASCVD)   . Essential hypertension   . Hyperlipidemia   . Fasting hyperglycemia     Past Surgical History:  Procedure Laterality Date  . APPENDECTOMY    . BACK SURGERY    . CATARACT EXTRACTION, BILATERAL Bilateral   . COLONOSCOPY N/A 09/28/2015   Procedure: COLONOSCOPY;  Surgeon: Rogene Houston, MD;  Location: [facility];  Service: Endoscopy;  Laterality: N/A;  955  . CORONARY ANGIOPLASTY WITH STENT PLACEMENT  2000  . CORONARY CTO INTERVENTION  07/15/2018  . CORONARY CTO INTERVENTION N/A 07/15/2018   Procedure: CORONARY CTO INTERVENTION;  Surgeon: Martinique, Peter M, MD;  Location: [place] CV LAB;  Service: Cardiovascular;  Laterality: N/A;  . CYSTOSCOPY W/ URETERAL STENT PLACEMENT    . EYE SURGERY Right 2013   "got hit by a limb & knocked my eye out"  . JOINT REPLACEMENT    . LEFT HEART CATH AND CORONARY ANGIOGRAPHY N/A 06/15/2018   Procedure: LEFT HEART CATH AND CORONARY ANGIOGRAPHY;  Surgeon: Martinique, Peter M, MD;  Location: [place] CV LAB;  Service: Cardiovascular;  Laterality: N/A;  .  [place]  . MELANOMA EXCISION Right    "inside my nose"  . TONSILLECTOMY    . TOTAL HIP ARTHROPLASTY Right 07/29/2016   Procedure: TOTAL HIP ARTHROPLASTY;  Surgeon: Garald Balding, MD;  Location: [place];  Service: Orthopedics;  Laterality: Right;       Family History  Problem Relation Age of Onset  . Diabetes Mellitus II Brother        x2 brothers  . Alzheimer's disease Mother     Social History   Tobacco Use  . Smoking status: Former Smoker    Packs/day: 1.00    Years: 27.00    Pack years: 27.00    Types: Cigarettes    Start date: 12/30/1943    Quit date:  12/29/1970    Years since quitting: 50.2  . Smokeless tobacco: Never Used  . Tobacco comment: "stopped smoking in 1970s when I got pneumonia"  Vaping Use  . Vaping Use: Never used  Substance Use Topics  . Alcohol use: Never    Alcohol/week: 0.0 standard drinks  . Drug use: Never    Home Medications Prior to Admission medications   Medication Sig Start Date End Date Taking? Authorizing Provider  aspirin EC 81 MG tablet Take 81 mg by mouth daily.   Yes [provider]  clopidogrel (PLAVIX) 75 MG tablet TAKE 1 TABLET BY MOUTH ONCE DAILY. Patient taking differently: Take 75 mg by mouth daily. 05/15/20  Yes Herminio Commons, MD  gabapentin (NEURONTIN) 300 MG capsule Take 300 mg by mouth 2 (two) times daily. 02/25/21  Yes [provider]  isosorbide mononitrate (IMDUR) 30 MG 24 hr tablet Take 1 tablet (30 mg total) by mouth daily. 02/29/20 05/29/20 Yes Herminio Commons, MD  metoprolol succinate (TOPROL-XL) 25 MG 24 hr tablet Take 25 mg by mouth daily.   Yes [provider]  traMADol (ULTRAM) 50 MG tablet Take 1 tablet (50 mg total) by mouth every 6 (six) hours as needed. 03/11/21  Yes Fredia Sorrow, MD  nitroGLYCERIN (NITROSTAT) 0.4 MG SL tablet Place 1 tablet (0.4 mg total) under the tongue every 5 (five) minutes as needed for chest pain. 07/16/18 05/16/20  Consuelo Pandy, PA-C    Allergies    Patient has no known allergies.  Review of Systems   Review of Systems  Constitutional: Negative for chills and fever.  HENT: Negative for rhinorrhea and sore throat.   Eyes: Negative for visual disturbance.  Respiratory: Negative for cough and shortness of breath.   Cardiovascular: Negative for chest pain and leg swelling.  Gastrointestinal: Positive for abdominal pain. Negative for diarrhea, nausea and vomiting.  Genitourinary: Negative for dysuria.  Musculoskeletal: Negative for back pain and neck pain.  Skin: Negative for rash.  Neurological: Negative for  dizziness, light-headedness and headaches.  Hematological: Does not bruise/bleed easily.  Psychiatric/Behavioral: Negative for confusion.    Physical Exam Updated Vital Signs BP 129/85   Pulse 78   Temp 97.8 F (36.6 C) (Oral)   Resp 19   Ht 1.829 m (6')   Wt 86.2 kg   SpO2 96%   BMI 25.77 kg/m   Physical Exam Vitals and nursing note reviewed.  Constitutional:      Appearance: Normal appearance. He is well-developed.  HENT:     Head: Normocephalic and atraumatic.  Eyes:     Extraocular Movements: Extraocular movements intact.     Conjunctiva/sclera: Conjunctivae normal.     Pupils: Pupils are equal, round, and  reactive to light.  Cardiovascular:     Rate and Rhythm: Normal rate and regular rhythm.     Heart sounds: No murmur heard.   Pulmonary:     Effort: Pulmonary effort is normal. No respiratory distress.     Breath sounds: Normal breath sounds.  Abdominal:     General: There is no distension.     Palpations: Abdomen is soft. There is no mass.     Tenderness: There is no abdominal tenderness.  Musculoskeletal:        General: Normal range of motion.     Cervical back: Neck supple.  Skin:    General: Skin is warm and dry.  Neurological:     General: No focal deficit present.     Mental Status: He is alert and oriented to person, place, and time.     Cranial Nerves: No cranial nerve deficit.     Sensory: No sensory deficit.     ED Results / Procedures / Treatments   Labs (all labs ordered are listed, but only abnormal results are displayed) Labs Reviewed  COMPREHENSIVE METABOLIC PANEL - Abnormal; Notable for the following components:      Result Value   Glucose, Bld 146 (*)    All other components within normal limits  CBC WITH DIFFERENTIAL/PLATELET - Abnormal; Notable for the following components:   WBC 12.5 (*)    All other components within normal limits  URINALYSIS, ROUTINE W REFLEX MICROSCOPIC - Abnormal; Notable for the following components:    Specific Gravity, Urine 1.042 (*)    Hgb urine dipstick SMALL (*)    All other components within normal limits  LIPASE, BLOOD    EKG None  Radiology CT Abdomen Pelvis W Contrast  Result Date: 03/11/2021 CLINICAL DATA:  Abdominal pain EXAM: CT ABDOMEN AND PELVIS WITH CONTRAST TECHNIQUE: Multidetector CT imaging of the abdomen and pelvis was performed using the standard protocol following bolus administration of intravenous contrast. CONTRAST:  126mL OMNIPAQUE IOHEXOL 300 MG/ML  SOLN COMPARISON:  None. FINDINGS: Lower chest: Bibasilar scarring/atelectasis. Hepatobiliary: Too small to characterize low-attenuation lesion of the superior left hepatic lobe. Gallbladder unremarkable. No biliary dilatation. Pancreas: Unremarkable. Spleen: Unremarkable. Adrenals/Urinary Tract: Adrenals are unremarkable. There are bilateral nonobstructing calculi measuring up to 5 mm the right upper pole. Distal ureters are obscured. Bladder is mostly obscured. Stomach/Bowel: Stomach is within normal limits. Bowel is normal in caliber. Normal appendix. Mild distal colonic diverticulosis. Vascular/Lymphatic: Extensive aortoiliac atherosclerosis with mixed plaque. No enlarged lymph nodes identified. Reproductive: Prostate is unremarkable.  Bilateral hydroceles. Other: No ascites.  No acute abnormality of the abdominal wall. Musculoskeletal: Degenerative changes of the included spine. No acute osseous abnormality. Partially imaged right total hip arthroplasty with associated streak artifact. IMPRESSION: No acute abnormality. Bilateral nonobstructing renal calculi. Colonic diverticulosis. Electronically Signed   By: Macy Mis M.D.   On: 03/11/2021 12:59    Procedures Procedures   Medications Ordered in ED Medications  iohexol (OMNIPAQUE) 300 MG/ML solution 100 mL (100 mLs Intravenous Contrast Given 03/11/21 1229)    ED Course  I have reviewed the triage vital signs and the nursing notes.  Pertinent labs & imaging  results that were available during my care of the patient were reviewed by me and considered in my medical decision making (see chart for details).    MDM Rules/Calculators/A&P                          Work-up for  the abdominal pain without any significant abnormalities.  No evidence of urinary tract infection.  Blood sugar little elevated at 146.  CT scan of the abdomen without any acute findings.  Mild leukocytosis with a white count of 12.5.  Patient wants tramadol to help with the pain prescription provided.  We will have him follow-up with his primary care doctor for blood sugar recheck.  And I will make a referral to gastroenterology for consideration for colonoscopy.  Patient will return for any new or worse symptoms    Final Clinical Impression(s) / ED Diagnoses Final diagnoses:  Lower abdominal pain    Rx / DC Orders ED Discharge Orders         Ordered    traMADol (ULTRAM) 50 MG tablet  Every 6 hours PRN        03/11/21 1441           Fredia Sorrow, MD 03/11/21 1444

## 2021-03-11 NOTE — ED Triage Notes (Signed)
Pt presents to ED with complaints of generalized abdominal pain x 3 days. Pt denies N/V/D

## 2021-03-11 NOTE — Discharge Instructions (Addendum)
Work-up for the abdominal pain now without any acute findings on CT scan.  Labs showed just a slight increase in your white blood cell count.  And a slight increase in your glucose.  Would recommend following up with Dr. Willey Blade to have your glucose rechecked.  Would recommend make an appointment with gastroenterology information provided above to for consideration for colonoscopy.  Return for any new or worse symptoms.  Take the tramadol as needed for pain.

## 2021-03-12 ENCOUNTER — Encounter (HOSPITAL_COMMUNITY): Payer: Self-pay | Admitting: *Deleted

## 2021-03-12 ENCOUNTER — Emergency Department (HOSPITAL_COMMUNITY)
Admission: EM | Admit: 2021-03-12 | Discharge: 2021-03-12 | Disposition: A | Discharge status: Home / Self Care | Payer: Medicare Other | Attending: Emergency Medicine | Admitting: Emergency Medicine

## 2021-03-12 ENCOUNTER — Other Ambulatory Visit: Payer: Self-pay

## 2021-03-12 DIAGNOSIS — Z87891 Personal history of nicotine dependence: Secondary | ICD-10-CM | POA: Diagnosis not present

## 2021-03-12 DIAGNOSIS — Z96641 Presence of right artificial hip joint: Secondary | ICD-10-CM | POA: Diagnosis not present

## 2021-03-12 DIAGNOSIS — I2510 Atherosclerotic heart disease of native coronary artery without angina pectoris: Secondary | ICD-10-CM | POA: Insufficient documentation

## 2021-03-12 DIAGNOSIS — Z7982 Long term (current) use of aspirin: Secondary | ICD-10-CM | POA: Diagnosis not present

## 2021-03-12 DIAGNOSIS — R55 Syncope and collapse: Secondary | ICD-10-CM | POA: Diagnosis not present

## 2021-03-12 DIAGNOSIS — I110 Hypertensive heart disease with heart failure: Secondary | ICD-10-CM | POA: Diagnosis not present

## 2021-03-12 DIAGNOSIS — I5042 Chronic combined systolic (congestive) and diastolic (congestive) heart failure: Secondary | ICD-10-CM | POA: Insufficient documentation

## 2021-03-12 DIAGNOSIS — R1030 Lower abdominal pain, unspecified: Secondary | ICD-10-CM | POA: Insufficient documentation

## 2021-03-12 DIAGNOSIS — Z79899 Other long term (current) drug therapy: Secondary | ICD-10-CM | POA: Diagnosis not present

## 2021-03-12 DIAGNOSIS — Z85828 Personal history of other malignant neoplasm of skin: Secondary | ICD-10-CM | POA: Insufficient documentation

## 2021-03-12 DIAGNOSIS — Z955 Presence of coronary angioplasty implant and graft: Secondary | ICD-10-CM | POA: Insufficient documentation

## 2021-03-12 DIAGNOSIS — E119 Type 2 diabetes mellitus without complications: Secondary | ICD-10-CM | POA: Insufficient documentation

## 2021-03-12 DIAGNOSIS — Z7902 Long term (current) use of antithrombotics/antiplatelets: Secondary | ICD-10-CM | POA: Diagnosis not present

## 2021-03-12 DIAGNOSIS — R1084 Generalized abdominal pain: Secondary | ICD-10-CM | POA: Diagnosis not present

## 2021-03-12 LAB — COMPREHENSIVE METABOLIC PANEL
ALT: 20 U/L (ref 0–44)
AST: 22 U/L (ref 15–41)
Albumin: 3.9 g/dL (ref 3.5–5.0)
Alkaline Phosphatase: 76 U/L (ref 38–126)
Anion gap: 10 (ref 5–15)
BUN: 20 mg/dL (ref 8–23)
CO2: 23 mmol/L (ref 22–32)
Calcium: 9.1 mg/dL (ref 8.9–10.3)
Chloride: 102 mmol/L (ref 98–111)
Creatinine, Ser: 1.08 mg/dL (ref 0.61–1.24)
GFR, Estimated: >60 mL/min (ref >60)
Glucose, Bld: 123 mg/dL — ABNORMAL HIGH (ref 70–99)
Potassium: 4.3 mmol/L (ref 3.5–5.1)
Sodium: 135 mmol/L (ref 135–145)
Total Bilirubin: 1.3 mg/dL — ABNORMAL HIGH (ref 0.3–1.2)
Total Protein: 8.1 g/dL (ref 6.5–8.1)

## 2021-03-12 LAB — LACTIC ACID, PLASMA: Lactic Acid, Venous: 1.2 mmol/L (ref 0.5–1.9)

## 2021-03-12 LAB — CBC WITH DIFFERENTIAL/PLATELET
Abs Immature Granulocytes: 0.03 10*3/uL (ref 0.00–0.07)
Basophils Absolute: 0.1 10*3/uL (ref 0.0–0.1)
Basophils Relative: 1 %
Eosinophils Absolute: 0.1 10*3/uL (ref 0.0–0.5)
Eosinophils Relative: 1 %
HCT: 47.4 % (ref 39.0–52.0)
Hemoglobin: 15.8 g/dL (ref 13.0–17.0)
Immature Granulocytes: 0 %
Lymphocytes Relative: 40 %
Lymphs Abs: 4.2 10*3/uL — ABNORMAL HIGH (ref 0.7–4.0)
MCH: 31.2 pg (ref 26.0–34.0)
MCHC: 33.3 g/dL (ref 30.0–36.0)
MCV: 93.5 fL (ref 80.0–100.0)
Monocytes Absolute: 0.8 10*3/uL (ref 0.1–1.0)
Monocytes Relative: 8 %
Neutro Abs: 5.3 10*3/uL (ref 1.7–7.7)
Neutrophils Relative %: 50 %
Platelets: 231 10*3/uL (ref 150–400)
RBC: 5.07 MIL/uL (ref 4.22–5.81)
RDW: 13.6 % (ref 11.5–15.5)
WBC: 10.6 10*3/uL — ABNORMAL HIGH (ref 4.0–10.5)
nRBC: 0 % (ref 0.0–0.2)

## 2021-03-12 LAB — LIPASE, BLOOD: Lipase: 29 U/L (ref 11–51)

## 2021-03-12 NOTE — ED Triage Notes (Signed)
Referred by PCP for abdominal pain.

## 2021-03-12 NOTE — Discharge Instructions (Signed)
Your lab work today is reassuring, white blood cell count is even improved compared to yesterday.  In addition to the pain medicine you were prescribed yesterday you can use over-the-counter Pepcid and Maalox to see if this helps with your symptoms.  Please call tomorrow morning to schedule close follow-up with GI.  If you develop fevers, new or worsening abdominal pain, vomiting, diarrhea, blood in the stool or any other new or concerning symptoms please return for reevaluation.

## 2021-03-12 NOTE — ED Provider Notes (Signed)
[facility] EMERGENCY DEPARTMENT Provider Note   CSN: [id number] Arrival date & time: 03/12/21  1544     History Chief Complaint  Patient presents with  . Abdominal Pain    Jimmy Knox is a 87 y.o. male.  Jimmy Knox is a 87 y.o. male with history of CHF, CAD, diabetes, hypertension, hyperlipidemia, who presents to the emergency department for evaluation of abdominal pain.  Patient was seen here in the ED for the same yesterday.  Reports he has been having lower abdominal pain, and told this pain has been present intermittently over the past 2 weeks but pain has become more severe over the past 4 days.  He has been having more persistent episodes of pain across his lower abdomen that last for longer amounts of time.  He is not sure what makes this come and go, he reports it seems to get worse at night.  He has not had much to eat over the past few days and so does not think it gets worse with food.  He denies any associated nausea or vomiting.  Reports normal bowel movements, no constipation, diarrhea or blood in the stool.  Denies any dysuria or urinary frequency.  No fevers or chills.  Was seen by his PCP in follow-up today after he had a reassuring evaluation yesterday with normal lab work aside from mild leukocytosis and reassuring CT.  Dr. Willey Blade, patient's PCP was concerned and sent the patient back to the emergency department, suggesting that he may need to see a GI doctor.        Past Medical History:  Diagnosis Date  . Anxiety   . Arthritis    "right hip" (07/15/2018)  . Carotid artery disease ([place])    a. <50% bilaterally 05/2016.  Marland Kitchen Chronic combined systolic and diastolic CHF (congestive heart failure) ([place])   . Complication of anesthesia    "didn't wake up well after OR on 07/30/2016; took 2-3 to hold me down"  . Coronary artery disease    a. prior MI/stent around 2000. b. Nuc 09/2015 -> abnl, mgmd medically.  . Facial neuralgia   . History of blood transfusion  07/30/2016   "after the operation"  . Hyperlipidemia   . Hypertension   . Ischemic cardiomyopathy   . Melanoma of nose ([place])    "inside on the right side"  . Myocardial infarction ([place]) ~ 2000  . Pneumonia 1935- 1970s ,"several times"  . [place] spotted fever 1942  . Skin cancer    "on my back; arms; froze them off"  . Type 2 diabetes, diet controlled [facility])     Patient Active Problem List   Diagnosis Date Noted  . Leg weakness, bilateral 06/28/2020  . IPF (idiopathic pulmonary fibrosis) ([place]) 05/16/2020  . Bilateral primary osteoarthritis of knee 01/19/2019  . Angina pectoris ([place]) 07/15/2018  . Dyspnea on exertion 06/15/2018  . Primary osteoarthritis of right hip 07/29/2016  . Hypotension 07/29/2016  . Chronic combined systolic and diastolic CHF (congestive heart failure) ([place]) 07/29/2016  . S/P total hip arthroplasty 07/29/2016  . Post-operative state   . Postprocedural hypotension   . Cardiomyopathy, ischemic 10/12/2013  . Carotid artery stenosis 10/12/2013  . Cerebrovascular disease 02/14/2013  . Arteriosclerotic cardiovascular disease (ASCVD)   . Essential hypertension   . Hyperlipidemia   . Fasting hyperglycemia     Past Surgical History:  Procedure Laterality Date  . APPENDECTOMY    . BACK SURGERY    . CATARACT EXTRACTION, BILATERAL  Bilateral   . COLONOSCOPY N/A 09/28/2015   Procedure: COLONOSCOPY;  Surgeon: Rogene Houston, MD;  [facility];  Service: Endoscopy;  Laterality: N/A;  955  . CORONARY ANGIOPLASTY WITH STENT PLACEMENT  2000  . CORONARY CTO INTERVENTION  07/15/2018  . CORONARY CTO INTERVENTION N/A 07/15/2018   Procedure: CORONARY CTO INTERVENTION;  Surgeon: Martinique, Peter M, MD;  Location: [place] CV LAB;  Service: Cardiovascular;  Laterality: N/A;  . CYSTOSCOPY W/ URETERAL STENT PLACEMENT    . EYE SURGERY Right 2013   "got hit by a limb & knocked my eye out"  . JOINT REPLACEMENT    . LEFT HEART CATH AND CORONARY ANGIOGRAPHY  N/A 06/15/2018   Procedure: LEFT HEART CATH AND CORONARY ANGIOGRAPHY;  Surgeon: Martinique, Peter M, MD;  Location: [place] CV LAB;  Service: Cardiovascular;  Laterality: N/A;  . LUMBAR [place]  . MELANOMA EXCISION Right    "inside my nose"  . TONSILLECTOMY    . TOTAL HIP ARTHROPLASTY Right 07/29/2016   Procedure: TOTAL HIP ARTHROPLASTY;  Surgeon: Garald Balding, MD;  Location: [place];  Service: Orthopedics;  Laterality: Right;       Family History  Problem Relation Age of Onset  . Diabetes Mellitus II Brother        x2 brothers  . Alzheimer's disease Mother     Social History   Tobacco Use  . Smoking status: Former Smoker    Packs/day: 1.00    Years: 27.00    Pack years: 27.00    Types: Cigarettes    Start date: 12/30/1943    Quit date: 12/29/1970    Years since quitting: 50.2  . Smokeless tobacco: Never Used  . Tobacco comment: "stopped smoking in 1970s when I got pneumonia"  Vaping Use  . Vaping Use: Never used  Substance Use Topics  . Alcohol use: Never    Alcohol/week: 0.0 standard drinks  . Drug use: Never    Home Medications Prior to Admission medications   Medication Sig Start Date End Date Taking? Authorizing Provider  aspirin EC 81 MG tablet Take 81 mg by mouth daily.    [provider]  clopidogrel (PLAVIX) 75 MG tablet TAKE 1 TABLET BY MOUTH ONCE DAILY. Patient taking differently: Take 75 mg by mouth daily. 05/15/20   Herminio Commons, MD  gabapentin (NEURONTIN) 300 MG capsule Take 300 mg by mouth 2 (two) times daily. 02/25/21   [provider]  isosorbide mononitrate (IMDUR) 30 MG 24 hr tablet Take 1 tablet (30 mg total) by mouth daily. 02/29/20 05/29/20  Herminio Commons, MD  metoprolol succinate (TOPROL-XL) 25 MG 24 hr tablet Take 25 mg by mouth daily.    [provider]  nitroGLYCERIN (NITROSTAT) 0.4 MG SL tablet Place 1 tablet (0.4 mg total) under the tongue every 5 (five) minutes as needed for chest pain. 07/16/18  05/16/20  Consuelo Pandy, PA-C  traMADol (ULTRAM) 50 MG tablet Take 1 tablet (50 mg total) by mouth every 6 (six) hours as needed. 03/11/21   Fredia Sorrow, MD    Allergies    Patient has no known allergies.  Review of Systems   Review of Systems  Constitutional: Negative for chills and fever.  HENT: Negative.   Respiratory: Negative for cough and shortness of breath.   Cardiovascular: Negative for chest pain.  Gastrointestinal: Positive for abdominal pain. Negative for blood in stool, constipation, diarrhea, nausea and vomiting.  Genitourinary: Negative for dysuria  and frequency.  Musculoskeletal: Negative for arthralgias and myalgias.  Skin: Negative for color change and rash.  Neurological: Negative for dizziness, syncope and light-headedness.  All other systems reviewed and are negative.   Physical Exam Updated Vital Signs BP 124/85   Pulse 87   Temp 98 F (36.7 C)   Resp 19   SpO2 99%   Physical Exam Vitals and nursing note reviewed.  Constitutional:      General: He is not in acute distress.    Appearance: He is well-developed. He is not ill-appearing or diaphoretic.  HENT:     Head: Normocephalic and atraumatic.     Mouth/Throat:     Mouth: Mucous membranes are moist.     Pharynx: Oropharynx is clear.  Eyes:     General:        Right eye: No discharge.        Left eye: No discharge.     Pupils: Pupils are equal, round, and reactive to light.  Cardiovascular:     Rate and Rhythm: Normal rate and regular rhythm.     Heart sounds: Normal heart sounds. No murmur heard. No friction rub. No gallop.   Pulmonary:     Effort: Pulmonary effort is normal. No respiratory distress.     Breath sounds: Normal breath sounds. No wheezing or rales.     Comments: Respirations equal and unlabored, patient able to speak in full sentences, lungs clear to auscultation bilaterally  Abdominal:     General: Bowel sounds are normal. There is no distension.     Palpations:  Abdomen is soft. There is no mass.     Tenderness: There is no abdominal tenderness. There is no guarding.     Comments: Abdomen is soft and nondistended, bowel sounds are present throughout, there is no tenderness over and over the abdomen, no guarding or peritoneal signs.  Musculoskeletal:        General: No deformity.     Cervical back: Neck supple.  Skin:    General: Skin is warm and dry.     Capillary Refill: Capillary refill takes less than 2 seconds.  Neurological:     Mental Status: He is alert and oriented to person, place, and time.     Coordination: Coordination normal.     Comments: Speech is clear, able to follow commands Moves extremities without ataxia, coordination intact  Psychiatric:        Mood and Affect: Mood normal.        Behavior: Behavior normal.     ED Results / Procedures / Treatments   Labs (all labs ordered are listed, but only abnormal results are displayed) Labs Reviewed  COMPREHENSIVE METABOLIC PANEL - Abnormal; Notable for the following components:      Result Value   Glucose, Bld 123 (*)    Total Bilirubin 1.3 (*)    All other components within normal limits  CBC WITH DIFFERENTIAL/PLATELET - Abnormal; Notable for the following components:   WBC 10.6 (*)    Lymphs Abs 4.2 (*)    All other components within normal limits  LIPASE, BLOOD  LACTIC ACID, PLASMA  URINALYSIS, ROUTINE W REFLEX MICROSCOPIC    EKG None  Radiology CT Abdomen Pelvis W Contrast  Result Date: 03/11/2021 CLINICAL DATA:  Abdominal pain EXAM: CT ABDOMEN AND PELVIS WITH CONTRAST TECHNIQUE: Multidetector CT imaging of the abdomen and pelvis was performed using the standard protocol following bolus administration of intravenous contrast. CONTRAST:  145mL OMNIPAQUE IOHEXOL 300 MG/ML  SOLN COMPARISON:  None. FINDINGS: Lower chest: Bibasilar scarring/atelectasis. Hepatobiliary: Too small to characterize low-attenuation lesion of the superior left hepatic lobe. Gallbladder  unremarkable. No biliary dilatation. Pancreas: Unremarkable. Spleen: Unremarkable. Adrenals/Urinary Tract: Adrenals are unremarkable. There are bilateral nonobstructing calculi measuring up to 5 mm the right upper pole. Distal ureters are obscured. Bladder is mostly obscured. Stomach/Bowel: Stomach is within normal limits. Bowel is normal in caliber. Normal appendix. Mild distal colonic diverticulosis. Vascular/Lymphatic: Extensive aortoiliac atherosclerosis with mixed plaque. No enlarged lymph nodes identified. Reproductive: Prostate is unremarkable.  Bilateral hydroceles. Other: No ascites.  No acute abnormality of the abdominal wall. Musculoskeletal: Degenerative changes of the included spine. No acute osseous abnormality. Partially imaged right total hip arthroplasty with associated streak artifact. IMPRESSION: No acute abnormality. Bilateral nonobstructing renal calculi. Colonic diverticulosis. Electronically Signed   By: Macy Mis M.D.   On: 03/11/2021 12:59    Procedures Procedures   Medications Ordered in ED Medications - No data to display  ED Course  I have reviewed the triage vital signs and the nursing notes.  Pertinent labs & imaging results that were available during my care of the patient were reviewed by me and considered in my medical decision making (see chart for details).    MDM Rules/Calculators/A&P                         Patient presents to the ED with complaints of continued abdominal pain. Patient nontoxic appearing, in no apparent distress, vitals WNL. On exam patient without any noted abdominal tenderness in all quadrants, no peritoneal signs noted. Will evaluate with labs, but will hold off on repeating imaging at this time given reassuring CT yesterday. Additional history obtained:  Additional history obtained from chart review & nursing note review.  Also spoke with patient's daughter regarding symptoms.  Lab Tests:  I Ordered, reviewed, and interpreted labs,  which included:  CBC: Improved white blood cell count, yesterday was 12.5, today is 10.6, normal hemoglobin CMP: Glucose mildly elevated at 123 but no other electrolyte derangements and normal renal function, T bili of 1.3, was 1.1 yesterday, all other LFTs unremarkable and patient without any focal right upper quadrant tenderness Lipase: WNL Lactic acid: WNL, low suspicion for mesenteric ischemia  Given reassuring lab, discussed with Dr. Laverta Baltimore saw and evaluated patient as well, do not feel that patient would benefit from repeat abdominal imaging.  ED Course:   Discussed case with Dr. Jenetta Downer with GI who reviewed patient's work-up yesterday and today, does not feel that there is something emergent that would be done with GI for evaluation, feels he would benefit from close outpatient follow-up, but does not think he needs admission at this time.  I have provided information regarding GI follow-up and discussed this with patient's daughter as well who are in agreement.  On repeat abdominal exam patient remains without peritoneal signs, low suspicion for cholecystitis, pancreatitis, diverticulitis, appendicitis, bowel obstruction/perforation or other acute surgical process. Patient tolerating PO in the emergency department. Will discharge home with supportive measures. I discussed results, treatment plan, need for GI follow-up, and return precautions with the patient. Provided opportunity for questions, patient confirmed understanding and is in agreement with plan.    Portions of this note were generated with [employer]. Dictation errors may occur despite best attempts at proofreading.  Final Clinical Impression(s) / ED Diagnoses Final diagnoses:  Lower abdominal pain    Rx / DC Orders ED Discharge Orders  None       Janet Berlin 03/12/21 2051    Margette Fast, MD 03/13/21 1148

## 2021-03-12 NOTE — ED Triage Notes (Deleted)
States he vapes 7-8 times a day

## 2021-03-12 NOTE — ED Notes (Signed)
Jimmy Knox left home # for chart ([phone], (C) [phone]

## 2021-03-13 ENCOUNTER — Telehealth: Payer: Self-pay | Admitting: Internal Medicine

## 2021-03-13 NOTE — Telephone Encounter (Signed)
He is Dr.Rehmans patient

## 2021-03-13 NOTE — Telephone Encounter (Signed)
NOTIFIED PCP

## 2021-03-13 NOTE — Telephone Encounter (Signed)
Patient went to er for Abdominal pain, patient PCP Dr. Willey Blade wanted him to be admitted but the hospital would not.  Has seen [facility] in the past, does he need to go there or will we accept him

## 2021-03-19 ENCOUNTER — Other Ambulatory Visit: Payer: Self-pay

## 2021-03-19 ENCOUNTER — Encounter (INDEPENDENT_AMBULATORY_CARE_PROVIDER_SITE_OTHER): Payer: Self-pay | Admitting: Internal Medicine

## 2021-03-19 ENCOUNTER — Ambulatory Visit (INDEPENDENT_AMBULATORY_CARE_PROVIDER_SITE_OTHER): Payer: Medicare Other | Admitting: Internal Medicine

## 2021-03-19 DIAGNOSIS — R110 Nausea: Secondary | ICD-10-CM

## 2021-03-19 MED ORDER — POLYETHYLENE GLYCOL 3350 17 GM/SCOOP PO POWD: 8.5000 g | Freq: Every day | ORAL | 0 refills | Status: DC

## 2021-03-19 MED ORDER — ONDANSETRON HCL 4 MG PO TABS: 4.0000 mg | ORAL_TABLET | Freq: Two times a day (BID) | ORAL | 1 refills | Status: DC | PRN

## 2021-03-19 NOTE — Patient Instructions (Signed)
Please call Dr. Willey Blade regarding difficulty sleeping Heme occult x 1

## 2021-03-19 NOTE — Progress Notes (Addendum)
Presenting complaint;  Nausea and constipation.  History of present illness  Patient is 87 year old Caucasian male who is referred through courtesy of Dr. Willey Blade for GI evaluation.  He is accompanied by his daughter Lattie Haw. He was in usual state of health until 8 days ago when he developed pain in right abdomen radiating posteriorly.  Pain was intense and made him doubled over.  He did not experience nausea vomiting fever chills or diaphoresis.  He was evaluated in the emergency room on 03/11/2021.  He was felt to have kidney stone.  He was evaluated and discharged.  He returned a day later on 03/12/2021 with similar pain.  He was able to finally pass a stone after second visit.  His abdominal pain has resolved and now he is experiencing nausea.  He also complains of constipation.  He has gone as many as 3 days without a bowel movement.  His daughter states that he was having issues with constipation 3 months ago but then he got better.  He also complains of postprandial bloating but he denies epigastric pain.  He also denies heartburn or dysphagia.  He has not had a good appetite is since he had issues with urolithiasis.  He states weight has been going up although he is eating much less.  He has difficulty walking.  He has issues with balance possibly secondary to peripheral neuropathy.  He is scheduled to be seen by neurologist in near future.  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  No history of peptic ulcer disease.  He denies melena or rectal bleeding. He has a history of colonic adenomas.  His last exam was in September 2016.  He had 2 small polyps removed but one was lost.  Polyp that was retrieved was tubular adenoma.  He also had sigmoid diverticulosis and small external hemorrhoids. According to his daughter he is still having difficult time due to his wife passing away.  He also complains of insomnia.  Current Medications: Outpatient Encounter  Medications as of 03/19/2021  Medication Sig  . aspirin EC 81 MG tablet Take 81 mg by mouth daily.  . clopidogrel (PLAVIX) 75 MG tablet TAKE 1 TABLET BY MOUTH ONCE DAILY. (Patient taking differently: Take 75 mg by mouth daily.)  . gabapentin (NEURONTIN) 300 MG capsule Take 300 mg by mouth 2 (two) times daily.  . isosorbide mononitrate (IMDUR) 30 MG 24 hr tablet Take 1 tablet (30 mg total) by mouth daily.  . metoprolol succinate (TOPROL-XL) 25 MG 24 hr tablet Take 25 mg by mouth daily.  . nitroGLYCERIN (NITROSTAT) 0.4 MG SL tablet Place 1 tablet (0.4 mg total) under the tongue every 5 (five) minutes as needed for chest pain.  . [DISCONTINUED] traMADol (ULTRAM) 50 MG tablet Take 1 tablet (50 mg total) by mouth every 6 (six) hours as needed. (Patient not taking: Reported on 03/19/2021)   No facility-administered encounter medications on file as of 03/19/2021.   Past medical history  Diabetes mellitus. Coronary artery disease.  He had CABG 30 years ago when he had coronary stenting in July 2019. Hypertension. Trigeminal neuralgia. History of colonic adenomas.  Last colonoscopy was in September 2016 as above. Head injury in 2002 and 2020. Peripheral neuropathy. Gait disorder possibly due to neuropathy History of kidney stones.  Past Surgical History:  Procedure Laterality Date  . APPENDECTOMY    . BACK SURGERY    . CATARACT EXTRACTION, BILATERAL Bilateral   .  COLONOSCOPY N/A 09/28/2015   Procedure: COLONOSCOPY;  Surgeon: Rogene Houston, MD;  [facility];  Service: Endoscopy;  Laterality: N/A;  955  . CORONARY ANGIOPLASTY WITH STENT PLACEMENT  2000  . CORONARY CTO INTERVENTION  07/15/2018  . CORONARY CTO INTERVENTION N/A 07/15/2018   Procedure: CORONARY CTO INTERVENTION;  Surgeon: Martinique, Peter M, MD;  Location: [place] CV LAB;  Service: Cardiovascular;  Laterality: N/A;  . CYSTOSCOPY W/ URETERAL STENT PLACEMENT    . EYE SURGERY Right 2013   "got hit by a limb & knocked my  eye out"  . JOINT REPLACEMENT    . LEFT HEART CATH AND CORONARY ANGIOGRAPHY N/A 06/15/2018   Procedure: LEFT HEART CATH AND CORONARY ANGIOGRAPHY;  Surgeon: Martinique, Peter M, MD;  Location: [place] CV LAB;  Service: Cardiovascular;  Laterality: N/A;  . LUMBAR [place]  . MELANOMA EXCISION Right    "inside my nose"  . TONSILLECTOMY    . TOTAL HIP ARTHROPLASTY Right 07/29/2016   Procedure: TOTAL HIP ARTHROPLASTY;  Surgeon: Garald Balding, MD;  Location: [place];  Service: Orthopedics;  Laterality: Right;    Allergies  No Known Allergies  Family history  Father lived to be 72.  Mother was diabetic and also lived to be 62.  He has 1 brother living who is 75 years old.  He is diabetic.  Another brother died at 78.  He was also diabetic.  Social history  Patient is widowed.  He lost his wife over 2 months ago.  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  He has a son and a daughter in good health.  He smokes cigarettes for 20 years less than a pack a day but quit in 1973.  He does not drink alcohol.  He lives alone but is children are close by.  Physical examination  Blood pressure 118/66, pulse 77, temperature (!) 97.4 F (36.3 C), temperature source Oral, height '6\' 1"'  (1.854 m), weight 202 lb 9.6 oz (91.9 kg). Patient is alert and in no acute distress. He is wearing a mask. Conjunctiva is pink. Sclera is nonicteric Oropharyngeal mucosa is normal. He has upper and lower dentures in place. No neck masses or thyromegaly noted. Cardiac exam with regular rhythm normal S1 and S2. No murmur or gallop noted. Lungs are clear to auscultation. Abdomen is full.  Bowel sounds are normal.  On palpation abdomen is soft and nontender with organomegaly or masses. No LE edema or clubbing noted.  Labs/studies Results:  CBC Latest Ref Rng & Units 03/12/2021 03/11/2021 03/27/2020  WBC 4.0 - 10.5 K/uL 10.6(H) 12.5(H) 9.1  Hemoglobin  13.0 - 17.0 g/dL 15.8 16.1 14.2  Hematocrit 39.0 - 52.0 % 47.4 49.2 43.2  Platelets 150 - 400 K/uL 231 216 242    CMP Latest Ref Rng & Units 03/12/2021 03/11/2021 03/27/2020  Glucose 70 - 99 mg/dL 123(H) 146(H) 100(H)  BUN 8 - 23 mg/dL '20 17 16  ' Creatinine 0.61 - 1.24 mg/dL 1.08 1.00 0.94  Sodium 135 - 145 mmol/L 135 135 141  Potassium 3.5 - 5.1 mmol/L 4.3 4.1 4.7  Chloride 98 - 111 mmol/L 102 101 104  CO2 22 - 32 mmol/L '23 24 23  ' Calcium 8.9 - 10.3 mg/dL 9.1 9.2 9.1  Total Protein 6.5 - 8.1 g/dL 8.1 7.7 -  Total Bilirubin 0.3 - 1.2 mg/dL 1.3(H) 1.1 -  Alkaline Phos 38 -  126 U/L 76 79 -  AST 15 - 41 U/L 22 22 -  ALT 0 - 44 U/L 20 20 -    Hepatic Function Latest Ref Rng & Units 03/12/2021 03/11/2021 07/21/2016  Total Protein 6.5 - 8.1 g/dL 8.1 7.7 7.4  Albumin 3.5 - 5.0 g/dL 3.9 3.9 4.2  AST 15 - 41 U/L '22 22 27  ' ALT 0 - 44 U/L '20 20 23  ' Alk Phosphatase 38 - 126 U/L 76 79 104  Total Bilirubin 0.3 - 1.2 mg/dL 1.3(H) 1.1 1.0     Abdominal pelvic CT from 03/11/2021 reviewed. Small bilateral renal calculi. No abnormality noted to gastric small bowel or colon other than sigmoid diverticulosis.  Assessment:  #1.  Nausea.  Patient's nausea started last week when he had renal colic.  He has passed the stone and pain has resolved but he remains with nausea.  He is not having any vomiting diarrhea melena or rectal bleeding.  I suspect this nausea is residual from his recent acute illness.  He is on low-dose aspirin and clopidogrel and therefore 1 has to worry about peptic ulcer disease but his abdominal exam is normal.  Recent H&H is normal.  LFTs are also normal with exception of bilirubin 1.3 which is felt to be insignificant.  Nausea may be related to constipation.  If he does not improve over the next week or 2 would consider esophagogastroduodenoscopy.  #2.  Constipation.  He does not have any alarm symptoms.  Last colonoscopy was in September 2016 as above.  We will try him on high-fiber diet  and polyethylene glycol and see how he does.  #3.  Insomnia.  He has been having difficulty sleeping since he lost his wife few months ago.  I recommended patient advised to follow-up with Dr. Willey Blade regarding insomnia.  Recommendations  Hemoccult x1. Ondansetron 4 mg p.o. twice daily as needed. Increase dietary fiber. Polyethylene glycol 8.5 g p.o. daily. Office visit in 1 month.

## 2021-03-25 ENCOUNTER — Telehealth (INDEPENDENT_AMBULATORY_CARE_PROVIDER_SITE_OTHER): Payer: Self-pay | Admitting: *Deleted

## 2021-03-25 NOTE — Telephone Encounter (Signed)
   Diagnosis:    Result(s)   Card 1 Negative:            Completed by: Thomas Hoff, LPN   HEMOCCULT SENSA DEVELOPER: TMA#:[id number] Marguarite Arbour DATE: 2022-06   HEMOCCULT SENSA CARD:  LOT#:  [id number] 1R EXPIRATION DATE: 07/22   CARD CONTROL RESULTS:  POSITIVE:  NEGATIVE:     ADDITIONAL COMMENTS: Patient's daughter was called and made aware.

## 2021-03-27 ENCOUNTER — Ambulatory Visit (INDEPENDENT_AMBULATORY_CARE_PROVIDER_SITE_OTHER): Payer: Self-pay

## 2021-03-27 ENCOUNTER — Other Ambulatory Visit: Payer: Self-pay

## 2021-04-09 DIAGNOSIS — Z23 Encounter for immunization: Secondary | ICD-10-CM | POA: Diagnosis not present

## 2021-04-18 ENCOUNTER — Other Ambulatory Visit: Payer: Self-pay

## 2021-04-18 ENCOUNTER — Encounter (INDEPENDENT_AMBULATORY_CARE_PROVIDER_SITE_OTHER): Payer: Self-pay | Admitting: Internal Medicine

## 2021-04-18 ENCOUNTER — Ambulatory Visit (INDEPENDENT_AMBULATORY_CARE_PROVIDER_SITE_OTHER): Payer: Medicare Other | Admitting: Internal Medicine

## 2021-04-18 DIAGNOSIS — K5900 Constipation, unspecified: Secondary | ICD-10-CM | POA: Diagnosis not present

## 2021-04-18 DIAGNOSIS — R109 Unspecified abdominal pain: Secondary | ICD-10-CM | POA: Diagnosis not present

## 2021-04-18 NOTE — Patient Instructions (Signed)
Can try decreasing polyethylene glycol to every other day. Take Phazyme 180 mg 2 tablets at bedtime daily for for 1 week and see if it helps alleviate abdominal pressure. Notify if abdominal pressure gets worse.

## 2021-04-18 NOTE — Progress Notes (Signed)
Presenting complaint;  Follow-up for nausea and constipation.  Database and subjective:  Patient is 87 year old Caucasian male who is here for scheduled visit accompanied by his son today. He was last seen on 03/19/2021 for nausea and constipation.  Few days prior to that he had been seen in emergency room on 2 successive days for abdominal pain which turned to be due to renal colic and he passed 1 stone.  He has few more stones left.  I felt his nausea was due to his recent illness and constipation may have been due to the same. Patient was advised to take polyethylene glycol daily at a reduced dose.   He states he is feeling much better.  He has not had any more nausea.  His bowels are moving daily.  He is taking 8.5 g of polyethylene glycol daily.  He wants to know if he could use it less often or on as-needed basis.  He is eating 3 meals a day and 1 snack which is usually an apple.  He is watching his diet.  He does not understand why he keeps gaining weight.  However he has lost 1 pound pound since his last visit few weeks ago. Patient complains of abdominal pressure and periumbilical region when he lies flat at night.  When he sits or stands this pressure goes away.  This pressure sensation is not associated with any other symptoms.  He does not have this symptom during the daytime. He stays busy.  He does yard work essentially every day.   Current Medications: Outpatient Encounter Medications as of 04/18/2021  Medication Sig  . aspirin EC 81 MG tablet Take 81 mg by mouth daily.  . clopidogrel (PLAVIX) 75 MG tablet TAKE 1 TABLET BY MOUTH ONCE DAILY. (Patient taking differently: Take 75 mg by mouth daily.)  . gabapentin (NEURONTIN) 300 MG capsule Take 300 mg by mouth 2 (two) times daily.  . metoprolol succinate (TOPROL-XL) 25 MG 24 hr tablet Take 25 mg by mouth daily.  . mometasone (NASONEX) 50 MCG/ACT nasal spray Place 2 sprays into the nose daily.  . ondansetron (ZOFRAN) 4 MG tablet Take 1  tablet (4 mg total) by mouth 2 (two) times daily as needed for nausea or vomiting.  . polyethylene glycol powder (GLYCOLAX/MIRALAX) 17 GM/SCOOP powder Take 8.5 g by mouth daily.  . isosorbide mononitrate (IMDUR) 30 MG 24 hr tablet Take 1 tablet (30 mg total) by mouth daily.  . nitroGLYCERIN (NITROSTAT) 0.4 MG SL tablet Place 1 tablet (0.4 mg total) under the tongue every 5 (five) minutes as needed for chest pain.   No facility-administered encounter medications on file as of 04/18/2021.    Objective: Blood pressure 114/67, pulse 80, temperature (!) 97.3 F (36.3 C), temperature source Oral, height _0  (1.854 m), weight 201 lb (91.2 kg). Patient is alert and in no acute distress. Conjunctiva is pink. Sclera is nonicteric Oropharyngeal mucosa is normal. He has upper and lower dentures in place. No neck masses or thyromegaly noted. Cardiac exam with regular rhythm normal S1 and S2. No murmur or gallop noted. Lungs are clear to auscultation. Abdomen is symmetrical.  Bowel sounds are hyperactive.  On palpation abdomen is soft and nontender with organomegaly or masses.  Percussion note is normal. No LE edema or clubbing noted.  Labs/studies Results:  CBC Latest Ref Rng & Units 03/12/2021 03/11/2021 03/27/2020  WBC 4.0 - 10.5 K/uL 10.6(H) 12.5(H) 9.1  Hemoglobin 13.0 - 17.0 g/dL 15.8 16.1 14.2  Hematocrit 39.0 -  52.0 % 47.4 49.2 43.2  Platelets 150 - 400 K/uL 231 216 242    CMP Latest Ref Rng & Units 03/12/2021 03/11/2021 03/27/2020  Glucose 70 - 99 mg/dL 123(H) 146(H) 100(H)  BUN 8 - 23 mg/dL _0 Creatinine 0.61 - 1.24 mg/dL 1.08 1.00 0.94  Sodium 135 - 145 mmol/L 135 135 141  Potassium 3.5 - 5.1 mmol/L 4.3 4.1 4.7  Chloride 98 - 111 mmol/L 102 101 104  CO2 22 - 32 mmol/L _1 Calcium 8.9 - 10.3 mg/dL 9.1 9.2 9.1  Total Protein 6.5 - 8.1 g/dL 8.1 7.7 -  Total Bilirubin 0.3 - 1.2 mg/dL 1.3(H) 1.1 -  Alkaline Phos 38 - 126 U/L 76 79 -  AST 15 - 41 U/L 22 22 -  ALT 0 - 44 U/L  20 20 -    Hepatic Function Latest Ref Rng & Units 03/12/2021 03/11/2021 07/21/2016  Total Protein 6.5 - 8.1 g/dL 8.1 7.7 7.4  Albumin 3.5 - 5.0 g/dL 3.9 3.9 4.2  AST 15 - 41 U/L _2 ALT 0 - 44 U/L _3 Alk Phosphatase 38 - 126 U/L 76 79 104  Total Bilirubin 0.3 - 1.2 mg/dL 1.3(H) 1.1 1.0    Abdominal pelvic CT from 03/11/2021 reviewed. No abnormality noted to pancreas.  No adenopathy.  Assessment:  #1.  Nausea most likely related to urolithiasis.  Nausea has resolved.  He has not had an episode since his last visit of 03/19/2021.  Therefore no further work-up unless nausea recurs.  #2.  Constipation.  He is doing well with dietary measures on low-dose polyethylene glycol.  He can try it every other day.  #3.  Abdominal pressure which curiously only occurs when he lies flat at night.  I wonder if this is referred from his back or nerve pain.  There is nothing suggest GI etiology.  It would be nice if he could be examined at night when he is having the symptom.  Plan:  Can try polyethylene glycol every other day.  If it does not work he can go back to taking it daily. Patient's granddaughter is a Marine scientist and I have asked if she could examine patient's abdomen at night when he has a sensation to see if he could learn anything from this exam. Patient or family member will call with progress report in few weeks. Patient will return for office visit in 3 months.

## 2021-04-22 ENCOUNTER — Telehealth (INDEPENDENT_AMBULATORY_CARE_PROVIDER_SITE_OTHER): Payer: Self-pay

## 2021-04-22 DIAGNOSIS — R109 Unspecified abdominal pain: Secondary | ICD-10-CM | POA: Diagnosis not present

## 2021-04-22 DIAGNOSIS — L62 Nail disorders in diseases classified elsewhere: Secondary | ICD-10-CM | POA: Diagnosis not present

## 2021-04-22 MED ORDER — SIMETHICONE 180 MG PO CAPS: 360.0000 mg | ORAL_CAPSULE | Freq: Every day | ORAL | 0 refills | Status: DC

## 2021-04-22 NOTE — Telephone Encounter (Signed)
Jimmy Knox, CMA  

## 2021-04-24 DIAGNOSIS — E119 Type 2 diabetes mellitus without complications: Secondary | ICD-10-CM | POA: Diagnosis not present

## 2021-04-24 DIAGNOSIS — H524 Presbyopia: Secondary | ICD-10-CM | POA: Diagnosis not present

## 2021-04-25 ENCOUNTER — Encounter: Payer: Self-pay | Admitting: Neurology

## 2021-04-25 ENCOUNTER — Ambulatory Visit (INDEPENDENT_AMBULATORY_CARE_PROVIDER_SITE_OTHER): Payer: Medicare Other | Admitting: Neurology

## 2021-04-25 VITALS — BP 139/83 | HR 69 | Ht 72.0 in | Wt 202.0 lb

## 2021-04-25 DIAGNOSIS — E538 Deficiency of other specified B group vitamins: Secondary | ICD-10-CM

## 2021-04-25 DIAGNOSIS — M4807 Spinal stenosis, lumbosacral region: Secondary | ICD-10-CM | POA: Diagnosis not present

## 2021-04-25 DIAGNOSIS — G629 Polyneuropathy, unspecified: Secondary | ICD-10-CM | POA: Diagnosis not present

## 2021-04-25 DIAGNOSIS — R2689 Other abnormalities of gait and mobility: Secondary | ICD-10-CM

## 2021-04-25 DIAGNOSIS — M48061 Spinal stenosis, lumbar region without neurogenic claudication: Secondary | ICD-10-CM

## 2021-04-25 DIAGNOSIS — R296 Repeated falls: Secondary | ICD-10-CM

## 2021-04-25 DIAGNOSIS — R269 Unspecified abnormalities of gait and mobility: Secondary | ICD-10-CM

## 2021-04-25 DIAGNOSIS — R42 Dizziness and giddiness: Secondary | ICD-10-CM

## 2021-04-25 DIAGNOSIS — R29898 Other symptoms and signs involving the musculoskeletal system: Secondary | ICD-10-CM | POA: Diagnosis not present

## 2021-04-25 NOTE — Patient Instructions (Signed)
MRI of the brain and lumbar spine Needs a thorough cardiac evaluation for dizziness

## 2021-04-25 NOTE — Progress Notes (Signed)
[facility]    Provider:  Dr Jaynee Eagles Requesting Provider: Asencion Noble, MD Primary Care Provider:  Asencion Noble, MD  CC:  Lower extremity weakness  HPI:  Jimmy Knox is a 87 y.o. male here as requested by Asencion Noble, MD for leg weakness and numbness.  He has a past medical history of atherosclerotic heart disease/coronary artery disease, kidney stones, essential hypertension, hyperlipidemia, depression, lumbosacral intervertebral disc degeneration, trigeminal neuralgia and type 2 diabetes, history of back surgery and is a former smoker.  I reviewed Dr. Ria Comment notes, he complains of weakness in his feet and legs and numbness in his feet, intermittent episodes of dizziness especially when leaning over, doses of metoprolol have been reduced to see if this will help, he has had well-controlled diabetes last hemoglobin A1c 6, he has a history of lumbar disc disease and back surgery years ago, he feels more weakness in his right leg than his left, he has a history of falling, he had a CT scan of the head 3 years ago revealing small vessel ischemic changes, examination showed his lungs were clear, heart regular with no murmurs, extremities revealed bilateral weakness with dorsiflexion of the feet, some weakness with the quads in the right leg, but he can arise from a chair independently, gait is abnormal and unsteady, no weakness in the upper extremities and Romberg negative.  His legs feel numb. Here with his daughter who also provides much information. His knees is giving him problems. Started 1 year ago, not getting any better, his balance is poor. He was out in the yard and he was bending over to tend to tomatoes and fell over. More imbalance than weakness but he does feel weak in the legs. Arms are completely fine, upper body very strong Numbness in the upper legs, in the feet and in the upper thighs. No back pain. Numbnes sin the feet for a month. In the legs a year. His feet feel  like they are asleep. He is a poor historian. Daughter says he has been more unstable for 6 months, worsening, using a cane the last 6 months, 4 months with the numbness sin the feet.  His legs swell. Right leg is weaker than his left leg. Dizziness is a separate issue, when he bends over he gets dizzy and he has to sit down for 30 minutes and he gets better, Mostly the dizziness is in standing position but can happen sitting. No other focal neurologic deficits, associated symptoms, inciting events or modifiable factors.  Reviewed notes, labs and imaging from outside physicians, which showed:   CT head 01/17/19: reviewed images and agree:  IMPRESSION: Mild diffuse cortical atrophy. Mild chronic ischemic white matter disease. Left frontal scalp laceration. No acute intracranial abnormality seen.  MRI lumbar spine 2017: 1. At L2-3 there is a moderate broad-based disc bulge. Moderate bilateral facet arthropathy. Severe central canal stenosis. 2. At L3-4 there is a moderate broad-based disc bulge. Severe bilateral facet arthropathy. Severe spinal stenosis. 3. At L4-5 there is a moderate broad-based disc bulge with a right foraminal disc protrusion. Severe right foraminal stenosis. Moderate bilateral facet arthropathy. Severe spinal stenosis. 4. Ectatic abdominal aorta at risk for aneurysm development. Recommend followup by ultrasound in 5 years. This recommendation follows ACR consensus guidelines: White Paper of the ACR Incidental Findings Committee II on Vascular Findings. J Am Coll Radiol 2013; 10:789-794.  Review of Systems: Patient complains of symptoms per HPI as well as the following symptoms: dizziness. Pertinent negatives and  positives per HPI. All others negative.   Social History   Socioeconomic History  . Marital status: Widowed    Spouse name: Not on file  . Number of children: Not on file  . Years of education: Not on file  . Highest education level: Not on file   Occupational History  . Occupation: Retired  Tobacco Use  . Smoking status: Former Smoker    Packs/day: 1.00    Years: 27.00    Pack years: 27.00    Types: Cigarettes    Start date: 12/30/1943    Quit date: 12/29/1970    Years since quitting: 50.3  . Smokeless tobacco: Never Used  . Tobacco comment: "stopped smoking in 1970s when I got pneumonia"  Vaping Use  . Vaping Use: Never used  Substance and Sexual Activity  . Alcohol use: Yes    Alcohol/week: 0.0 standard drinks    Comment: beer every once in awhile recommended by doctor for kidneys   . Drug use: Never  . Sexual activity: Not Currently  Other Topics Concern  . Not on file  Social History Narrative   Lives alone    Right handed   Caffeine: decaf coffee once a week or so    Social Determinants of Health   Financial Resource Strain: Not on file  Food Insecurity: Not on file  Transportation Needs: Not on file  Physical Activity: Not on file  Stress: Not on file  Social Connections: Not on file  Intimate Partner Violence: Not on file    Family History  Problem Relation Age of Onset  . Diabetes Mellitus II Brother        x2 brothers  . Alzheimer's disease Mother   . Diabetes Brother   . Diabetes Maternal Uncle     Past Medical History:  Diagnosis Date  . Anxiety   . Arthritis    "right hip" (07/15/2018)  . Carotid artery disease ([place])    a. <50% bilaterally 05/2016.  Marland Kitchen Chronic combined systolic and diastolic CHF (congestive heart failure) ([place])   . Complication of anesthesia    "didn't wake up well after OR on 07/30/2016; took 2-3 to hold me down"  . Coronary artery disease    a. prior MI/stent around 2000. b. Nuc 09/2015 -> abnl, mgmd medically.  . Facial neuralgia   . History of blood transfusion 07/30/2016   "after the operation"  . Hyperlipidemia   . Hypertension   . Ischemic cardiomyopathy   . Melanoma of nose ([place])    "inside on the right side"  . Myocardial infarction ([place]) ~ 2000  . Pneumonia  1935- 1970s ,"several times"  . Rocky Mountain spotted fever 1942  . Skin cancer    "on my back; arms; froze them off"  . Type 2 diabetes, diet controlled [facility])     Patient Active Problem List   Diagnosis Date Noted  . Spinal stenosis of lumbar region 04/25/2021  . Constipation 04/18/2021  . Abdominal pressure 04/18/2021  . Nausea without vomiting 03/19/2021  . Leg weakness, bilateral 06/28/2020  . IPF (idiopathic pulmonary fibrosis) ([place]) 05/16/2020  . Bilateral primary osteoarthritis of knee 01/19/2019  . Angina pectoris ([place]) 07/15/2018  . Dyspnea on exertion 06/15/2018  . Primary osteoarthritis of right hip 07/29/2016  . Hypotension 07/29/2016  . Chronic combined systolic and diastolic CHF (congestive heart failure) ([place]) 07/29/2016  . S/P total hip arthroplasty 07/29/2016  . Post-operative state   . Postprocedural hypotension   . Cardiomyopathy, ischemic 10/12/2013  .  Carotid artery stenosis 10/12/2013  . Cerebrovascular disease 02/14/2013  . Arteriosclerotic cardiovascular disease (ASCVD)   . Essential hypertension   . Hyperlipidemia   . Fasting hyperglycemia     Past Surgical History:  Procedure Laterality Date  . APPENDECTOMY    . BACK SURGERY    . CATARACT EXTRACTION, BILATERAL Bilateral   . COLONOSCOPY N/A 09/28/2015   Procedure: COLONOSCOPY;  Surgeon: Rogene Houston, MD;  Location[facility];  Service: Endoscopy;  Laterality: N/A;  955  . CORONARY ANGIOPLASTY WITH STENT PLACEMENT  2000  . CORONARY CTO INTERVENTION  07/15/2018  . CORONARY CTO INTERVENTION N/A 07/15/2018   Procedure: CORONARY CTO INTERVENTION;  Surgeon: Martinique, Peter M, MD;  Location: [facility];  Service: Cardiovascular;  Laterality: N/A;  . CYSTOSCOPY W/ URETERAL STENT PLACEMENT    . EYE SURGERY Right 2013   "got hit by a limb & knocked my eye out"  . JOINT REPLACEMENT    . LEFT HEART CATH AND CORONARY ANGIOGRAPHY N/A 06/15/2018   Procedure: LEFT HEART CATH AND CORONARY  ANGIOGRAPHY;  Surgeon: Martinique, Peter M, MD;  Location: [facility];  Service: Cardiovascular;  Laterality: N/A;  . LUMBAR [place]  . MELANOMA EXCISION Right    "inside my nose"  . TONSILLECTOMY    . TOTAL HIP ARTHROPLASTY Right 07/29/2016   Procedure: TOTAL HIP ARTHROPLASTY;  Surgeon: Garald Balding, MD;  Location: [facility];  Service: Orthopedics;  Laterality: Right;    Current Outpatient Medications  Medication Sig Dispense Refill  . aspirin EC 81 MG tablet Take 81 mg by mouth daily.    . clopidogrel (PLAVIX) 75 MG tablet TAKE 1 TABLET BY MOUTH ONCE DAILY. (Patient taking differently: Take 75 mg by mouth daily.) 90 tablet 1  . gabapentin (NEURONTIN) 300 MG capsule Take 300 mg by mouth 2 (two) times daily.    . isosorbide mononitrate (IMDUR) 30 MG 24 hr tablet Take 1 tablet (30 mg total) by mouth daily. 90 tablet 3  . metoprolol succinate (TOPROL-XL) 25 MG 24 hr tablet Take 25 mg by mouth daily.    . nitroGLYCERIN (NITROSTAT) 0.4 MG SL tablet Place 1 tablet (0.4 mg total) under the tongue every 5 (five) minutes as needed for chest pain. 25 tablet 2  . Simethicone (PHAZYME) 180 MG CAPS Take 2 capsules (360 mg total) by mouth at bedtime. 90 capsule 0   No current facility-administered medications for this visit.    Allergies as of 04/25/2021  . (No Known Allergies)    Vitals: BP 139/83 (BP Location: Right Arm, Patient Position: Sitting)   Pulse 69   Ht 6' (1.829 m)   Wt 202 lb (91.6 kg)   BMI 27.40 kg/m  Last Weight:  Wt Readings from Last 1 Encounters:  04/25/21 202 lb (91.6 kg)   Last Height:   Ht Readings from Last 1 Encounters:  04/25/21 6' (1.829 m)     Physical exam: Exam: Gen: NAD              CV: RRR, no MRG. No Carotid Bruits. +peripheral edema, warm, nontender Eyes: Conjunctivae clear without exudates or hemorrhage  Neuro: Detailed Neurologic Exam  Speech:    Speech is normal; fluent and spontaneous with normal comprehension.  Cognition:     The patient is oriented to person, place, and time;     recent and remote memory impaired;     language fluent;     Impaired attention, concentration,  fund of knowledge Cranial Nerves:   Left pupil pinpoint, right pupil irregular and fixed (chonic, eye injury), could not visualize left fundi, right flat. Visual fields are impaired in the right eye. Marland Kitchen Extraocular movements are intact. Trigeminal sensation is intact and the muscles of mastication are normal. The face is symmetric. The palate elevates in the midline. Hearing impaired. Voice is normal. Shoulder shrug is normal. The tongue has normal motion without fasciculations.   Coordination:    Normal finger to nose  Gait:    Can stand independently, wide based, cautious, multi-step turn but not classically shuffling and does swing his arms, no tremor.   Motor Observation:    No asymmetry, no atrophy, and no involuntary movements noted. Tone:    Normal muscle tone.    Posture:    Posture is slightly stooped    Strength: right hip flexion and leg flexion and right dorsiflexion 4/5, left dorsiflexion slightly weak otherwise strength is V/V in the upper and lower limbs.      Sensation: intact to LT     Reflex Exam:  DTR's: Absent AJs, otherwise deep tendon reflexes in the upper and lower extremities are 1+ bilaterally.   Toes:    The toes are downgoing bilaterally.   Clonus:    Clonus is absent.    Assessment/Plan:   87 y.o. male here as requested by Asencion Noble, MD for leg weakness and numbness and dizziness.  He has a past medical history of atherosclerotic heart disease/coronary artery disease, kidney stones, essential hypertension, hyperlipidemia, depression, lumbosacral intervertebral disc degeneration, trigeminal neuralgia and type 2 diabetes, history of back surgery many years ago(over decade) and is a former smoker. They see Dr. Vertell Limber. He has severe multilevel central stenosis in his low back likely the cause of many of his  symptoms, needs cardiac eval for unrelated dizziness (see below)  Imbalance and numbness: MRI 2017 is quite severe, extensive/severe multi-level central canal stenosis at L2-L3, L3-L4, L4-L5. Likely the cause of his imbalance and numbness. His diabetes can also cause numbness in the feet, will check b12 a few other labs today. Given the severity and his age I am not sure whether he would be a good candidate for surgery but family wants repeat MRI Lspine(They state he has not had surgery since the 2017 MRI was completed) and referral to Dr. Vertell Limber.   Dizziness: Says not related to his leg weakness, different issue.  Cannot address everything at this appointment today in neurology. Recommend orthostatic vital signs  with Dr. Willey Blade, f/u with Dr. Willey Blade or cardiology and a cardiac workup, his echo in 2017 showed reduced EF and global hypokinesis, with his new leg swelling needs thorough cardiac evaluation. Will check MRI of the brain but return to Dr. Willey Blade for cardiac eval per Dr. Ria Comment recommendations. He has not seen Cardiology in 2+ years.  MRI brain and MRI lumbar spine [facility]  He has also not been to physical therapy per patient and daughter for imbalance. After evaluation above, recommend discussion with Dr. Willey Blade and referral  We will see if there are any findings in the brain that necessitate repeat neuro appointment, f/u pending results.  Orders Placed This Encounter  Procedures  . MR BRAIN W WO CONTRAST  . MR LUMBAR SPINE WO CONTRAST  . B12 and Folate Panel  . Methylmalonic acid, serum  . Vitamin B1  . Comprehensive metabolic panel  . CBC  . Ambulatory referral to Neurosurgery     Cc: Willey Blade,  Carloyn Manner, MD,  Erline Levine MD  Sarina Ill, MD  [facility] [address] [place], [place] [address]  Phone [phone] Fax [phone]  I spent over 90 minutes of face-to-face and non-face-to-face time with patient on the  1. Imbalance   2.  Neuropathy   3. Dizziness   4. B12 deficiency   5. Spinal stenosis of lumbar region, unspecified whether neurogenic claudication present   6. Gait abnormality   7. Spinal stenosis of lumbosacral region   8. Right leg weakness   9. Falls frequently    diagnosis.  This included previsit chart review, lab review, study review, order entry, electronic health record documentation, patient education on the different diagnostic and therapeutic options, counseling and coordination of care, risks and benefits of management, compliance, or risk factor reduction

## 2021-04-27 ENCOUNTER — Telehealth: Payer: Self-pay | Admitting: Neurology

## 2021-04-27 NOTE — Telephone Encounter (Signed)
Jimmy Knox: Please call daughter, he has B12 deficiency. Recommend 1078mcg b12 shots weekly for 4 weeks and then once a month for 6 months. 1052mcg B12 daily po long term after that and regular checks with [place] or pcp.  Long-term B12 deficiency can cause multiple problems and in severe cases, dementia.See phone note.(FYI Dr. Willey Blade)

## 2021-04-29 ENCOUNTER — Telehealth: Payer: Medicare Other | Admitting: Neurology

## 2021-04-29 NOTE — Telephone Encounter (Signed)
Faxed referral to Dr. Unice Cobble [facility] at [phone]. Phone [phone]).

## 2021-04-29 NOTE — Telephone Encounter (Signed)
LVM for daughter to call office back.

## 2021-04-29 NOTE — Telephone Encounter (Signed)
Medicare/aarp no auth.   Patient is scheduled at [facility] for 05/14/21 to arrive at 3:30 pm. I spoke with Daughter Lattie Haw on the [facility] and she is aware of time & day. I also gave her their number of [phone] incase they needed to r/s.

## 2021-04-30 ENCOUNTER — Telehealth: Payer: Self-pay | Admitting: *Deleted

## 2021-04-30 DIAGNOSIS — E538 Deficiency of other specified B group vitamins: Secondary | ICD-10-CM

## 2021-04-30 NOTE — Telephone Encounter (Signed)
Patient's daughter, Lattie Haw, returned my call. She is in agreement for patient to proceed with vitamin B12 injections once weekly x4 then once monthly x6 then likely will need oral B12 1000 mcg daily long-term and also needs long-term management.  She stated the easiest thing was for them to go to primary care and she had already called Dr. Ria Comment office but they said a prescription will have to be sent to the pharmacy because they do not keep it on hand in the office.  However when the patient picks up the prescription they can keep it there for him each time he returns for an injection.  She asked that I reach out to Dr. Ria Comment office to coordinate.  I assured her that I would and she verbalized appreciation for the call.   I called Dr. Ria Comment office and spoke with Kathlee Nations.  She will have Abigail Butts call me back.

## 2021-04-30 NOTE — Telephone Encounter (Signed)
Called and spoke w/ daughter, Lattie Haw. Relayed results per AA,MD note. She is going to contact PCP office in [place] to see if they can do injections. (he typically goes to PCP office in [place] but she knows he won't go there once a week). She will call back to let us know how to proceed once she speaks w/ pt and her husband.

## 2021-04-30 NOTE — Telephone Encounter (Signed)
-----   Message from Melvenia Beam, MD sent at 04/27/2021 10:44 AM EDT ----- Jimmy Knox: Please call daughter, he has B12 deficiency. Recommend 1021mcg b12 shots weekly for 4 weeks and then once a month for 6 months. 1050mcg B12 daily po long term after that and regular checks with [place] or pcp. Long-term B12 deficiency can cause multiple problems and in severe cases, dementia.See phone note.(FYI Dr. Willey Blade)

## 2021-04-30 NOTE — Telephone Encounter (Signed)
Jimmy Knox returned my call.  She asked that I talk with her more with Dr. Jaynee Eagles to see if she would be prescribing it.  If not she will talk to Dr. Ria Comment.  We discussed Dr. Cathren Laine recommended schedule and dosage of the B12 injection replacement.  I let her know I would give her a call back after I spoke with Dr. Jaynee Eagles. She verbalized appreciation.

## 2021-05-01 ENCOUNTER — Telehealth: Payer: Self-pay

## 2021-05-01 MED ORDER — CYANOCOBALAMIN 1000 MCG/ML IJ SOLN: INTRAMUSCULAR | 9 refills | Status: DC

## 2021-05-01 NOTE — Telephone Encounter (Signed)
Pt's daughter called this AM and stated she found out that [facility] in [place] will actually administer the B12 injections to the patient. It is only 4 minutes from patient. She stated Dr Willey Blade is ok with this and knows the pharmacist there. Pt's daughter prefers this if ok with Dr Jaynee Eagles and rx will need to be sent. I told her I would d/w Dr Jaynee Eagles and let her know. Lattie Haw stated it was ok to leave a message with update on her phone.

## 2021-05-01 NOTE — Telephone Encounter (Signed)
I had not seen this phone note and started a new one yesterday 5/3. Please see other phone note regarding the latest information.

## 2021-05-01 NOTE — Telephone Encounter (Signed)
Spoke with Dr Jaynee Eagles. She would like to proceed with sending B12 injection prescription to [facility] for them to administer.   I spoke with staff at [facility] and discussed plan. Confirmed they do prefer e-scribe.   Order placed for Cyanocobalamin 1000 mcg IM once weekly x 4, then once monthly x 6. Dispense 1 mL with 9 refills.   Spoke with pt's daughter, Lattie Haw (on [place]), and let her know the prescription has been sent. She will be in touch with the pharmacy to coordinate injection. She also stated the pt has an appt in July with Dr Willey Blade and will ask about rechecking B12 at that time. She verbalized appreciation for the call. She will call back with any questions.

## 2021-05-01 NOTE — Addendum Note (Signed)
Addended by: Gildardo Griffes on: 05/01/2021 03:03 PM   Modules accepted: Orders

## 2021-05-01 NOTE — Telephone Encounter (Signed)
error 

## 2021-05-09 LAB — VITAMIN B1: Thiamine: 150.3 nmol/L (ref 66.5–200.0)

## 2021-05-09 LAB — COMPREHENSIVE METABOLIC PANEL
ALT: 21 IU/L (ref 0–44)
AST: 24 IU/L (ref 0–40)
Albumin/Globulin Ratio: 1.6 (ref 1.2–2.2)
Albumin: 4.4 g/dL (ref 3.6–4.6)
Alkaline Phosphatase: 97 IU/L (ref 44–121)
BUN/Creatinine Ratio: 13 (ref 10–24)
BUN: 12 mg/dL (ref 8–27)
Bilirubin Total: 0.7 mg/dL (ref 0.0–1.2)
CO2: 23 mmol/L (ref 20–29)
Calcium: 9.3 mg/dL (ref 8.6–10.2)
Chloride: 104 mmol/L (ref 96–106)
Creatinine, Ser: 0.95 mg/dL (ref 0.76–1.27)
Globulin, Total: 2.8 g/dL (ref 1.5–4.5)
Glucose: 106 mg/dL — ABNORMAL HIGH (ref 65–99)
Potassium: 4.5 mmol/L (ref 3.5–5.2)
Sodium: 141 mmol/L (ref 134–144)
Total Protein: 7.2 g/dL (ref 6.0–8.5)
eGFR: 77 mL/min/{1.73_m2} (ref >59)

## 2021-05-09 LAB — CBC
Hematocrit: 45.1 % (ref 37.5–51.0)
Hemoglobin: 15.3 g/dL (ref 13.0–17.7)
MCH: 31 pg (ref 26.6–33.0)
MCHC: 33.9 g/dL (ref 31.5–35.7)
MCV: 92 fL (ref 79–97)
Platelets: 220 10*3/uL (ref 150–450)
RBC: 4.93 x10E6/uL (ref 4.14–5.80)
RDW: 13 % (ref 11.6–15.4)
WBC: 10.9 10*3/uL — ABNORMAL HIGH (ref 3.4–10.8)

## 2021-05-09 LAB — B12 AND FOLATE PANEL
Folate: 4.7 ng/mL (ref >3.0)
Vitamin B-12: 200 pg/mL — ABNORMAL LOW (ref 232–1245)

## 2021-05-09 LAB — METHYLMALONIC ACID, SERUM: Methylmalonic Acid: 669 nmol/L — ABNORMAL HIGH (ref 0–378)

## 2021-05-14 ENCOUNTER — Other Ambulatory Visit: Payer: Self-pay

## 2021-05-14 ENCOUNTER — Ambulatory Visit (HOSPITAL_COMMUNITY)
Admission: RE | Admit: 2021-05-14 | Discharge: 2021-05-14 | Disposition: A | Discharge status: Home / Self Care | Payer: Medicare Other | Source: Ambulatory Visit | Attending: Neurology | Admitting: Neurology

## 2021-05-14 DIAGNOSIS — R42 Dizziness and giddiness: Secondary | ICD-10-CM | POA: Diagnosis not present

## 2021-05-14 DIAGNOSIS — R531 Weakness: Secondary | ICD-10-CM | POA: Diagnosis not present

## 2021-05-14 DIAGNOSIS — R29898 Other symptoms and signs involving the musculoskeletal system: Secondary | ICD-10-CM | POA: Diagnosis not present

## 2021-05-14 DIAGNOSIS — R2689 Other abnormalities of gait and mobility: Secondary | ICD-10-CM

## 2021-05-14 DIAGNOSIS — M48061 Spinal stenosis, lumbar region without neurogenic claudication: Secondary | ICD-10-CM | POA: Diagnosis not present

## 2021-05-14 DIAGNOSIS — R296 Repeated falls: Secondary | ICD-10-CM | POA: Diagnosis not present

## 2021-05-14 DIAGNOSIS — M4807 Spinal stenosis, lumbosacral region: Secondary | ICD-10-CM

## 2021-05-14 DIAGNOSIS — R269 Unspecified abnormalities of gait and mobility: Secondary | ICD-10-CM

## 2021-05-14 DIAGNOSIS — G319 Degenerative disease of nervous system, unspecified: Secondary | ICD-10-CM | POA: Diagnosis not present

## 2021-05-14 DIAGNOSIS — M5126 Other intervertebral disc displacement, lumbar region: Secondary | ICD-10-CM | POA: Diagnosis not present

## 2021-05-14 DIAGNOSIS — R200 Anesthesia of skin: Secondary | ICD-10-CM | POA: Diagnosis not present

## 2021-05-14 MED ORDER — GADOBUTROL 1 MMOL/ML IV SOLN
10.0000 mL | Freq: Once | INTRAVENOUS | Status: AC | PRN
  Administered 2021-05-14: 10 mL via INTRAVENOUS

## 2021-05-16 ENCOUNTER — Telehealth: Payer: Self-pay | Admitting: *Deleted

## 2021-05-16 NOTE — Telephone Encounter (Addendum)
Called patient's daughter Lattie Haw (on [place]) and LVM (ok per DPR) with results of MRI brain and lumbar spine.  I have asked for a call back to let us know if the patient is okay with proceeding with a neurosurgery consult.  Left office number in message.  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 He needs to see neurosurgery for evaluation of surgery. Let me know and I can place referral to Dr. Vertell Limber if he agrees. thanks  Melvenia Beam, MD  05/16/2021 8:48 AM EDT Back to Top     The brain is unremarkable. See MRI lumbar spine with severe central stenosis at multiple levels, needs neurosurgery, if agreeable let me know and I can place referral for Dr. Vertell Limber unless he is established with another neurosurgeon or orthopaedic doc that performs these surgeries. thanks

## 2021-05-20 NOTE — Progress Notes (Signed)
Cardiology Office Note    Date:  05/28/2021   ID:  Jimmy Knox, DOB [birth year], MRN [id number]   PCP:  Asencion Noble, [place]  Cardiologist:  None  Advanced Practice Provider:  No care team member to display Electrophysiologist:  None   [id number]   No chief complaint on file.   History of Present Illness:  Jimmy Knox is a 87 y.o. male with history of coronary artery disease. He underwent CTO PCI of the mid RCA with a drug-eluting stent on 07/15/2018.  He underwent unsuccessful CTO PCI of the proximal left circumflex due to inability to cross with a wire. Also has HTN, HLD, carotid disease,  history of  exertional dyspnea and ILD on CT followed by pulmonary.  Last seen 04/12/20 and referred to pulmonary for ILD. Echo 02/2020 EF 45-50% mild LVH  Is referred back by neurology for recent dizziness when leaning over and metoprolol reduced. He has severe multilevel central stenosis in his low back. Dr. Willey Blade recommends ENT evaluation. MRI 05/14/21 severe spinal stenosis-see below for details.  Patient comes in with his daughter. He complains of dizziness for about 6 months when he bends over and stands up or gets up from laying down. His wife died in January 23, 2023 and he has lost 10 lbs. Patient checks BP at home regularly. Was having a lot of leg swelling a few weeks ago but that resolved. Eating canned meats, sausage and biscuits, country ham. No chest pain. No bleeding problems on Plavix.  Past Medical History:  Diagnosis Date  . Anxiety   . Arthritis    "right hip" (07/15/2018)  . Carotid artery disease ([place])    a. <50% bilaterally 05/2016.  Marland Kitchen Chronic combined systolic and diastolic CHF (congestive heart failure) ([place])   . Complication of anesthesia    "didn't wake up well after OR on 07/30/2016; took 2-3 to hold me down"  . Coronary artery disease    a. prior MI/stent around 2000. b. Nuc 09/2015 -> abnl, mgmd medically.  . Facial neuralgia   .  History of blood transfusion 07/30/2016   "after the operation"  . Hyperlipidemia   . Hypertension   . Ischemic cardiomyopathy   . Melanoma of nose ([place])    "inside on the right side"  . Myocardial infarction ([place]) ~ 2000  . Pneumonia 1935- 1970s ,"several times"  . [place] spotted fever 1942  . Skin cancer    "on my back; arms; froze them off"  . Type 2 diabetes, diet controlled ([place])     Past Surgical History:  Procedure Laterality Date  . APPENDECTOMY    . BACK SURGERY    . CATARACT EXTRACTION, BILATERAL Bilateral   . COLONOSCOPY N/A 09/28/2015   Procedure: COLONOSCOPY;  Surgeon: Rogene Houston, MD;  [facility];  Service: Endoscopy;  Laterality: N/A;  955  . CORONARY ANGIOPLASTY WITH STENT PLACEMENT  2000  . CORONARY CTO INTERVENTION  07/15/2018  . CORONARY CTO INTERVENTION N/A 07/15/2018   Procedure: CORONARY CTO INTERVENTION;  Surgeon: Martinique, Peter M, MD;  Location: [facility];  Service: Cardiovascular;  Laterality: N/A;  . CYSTOSCOPY W/ URETERAL STENT PLACEMENT    . EYE SURGERY Right 2013   "got hit by a limb & knocked my eye out"  . JOINT REPLACEMENT    . LEFT HEART CATH AND CORONARY ANGIOGRAPHY N/A 06/15/2018   Procedure: LEFT HEART CATH AND CORONARY ANGIOGRAPHY;  Surgeon: Martinique,  Ander Slade, MD;  Location: [facility];  Service: Cardiovascular;  Laterality: N/A;  . LUMBAR [place]  . MELANOMA EXCISION Right    "inside my nose"  . TONSILLECTOMY    . TOTAL HIP ARTHROPLASTY Right 07/29/2016   Procedure: TOTAL HIP ARTHROPLASTY;  Surgeon: Garald Balding, MD;  Location: [facility];  Service: Orthopedics;  Laterality: Right;    Current Medications: Current Meds  Medication Sig  . aspirin EC 81 MG tablet Take 81 mg by mouth daily.  . clopidogrel (PLAVIX) 75 MG tablet TAKE 1 TABLET BY MOUTH ONCE DAILY. (Patient taking differently: Take 75 mg by mouth daily.)  . cyanocobalamin (,VITAMIN B-12,) 1000 MCG/ML injection Inject 1000 mcg IM  once weekly for 4 weeks then once monthly for 6 months.  . gabapentin (NEURONTIN) 300 MG capsule Take 300 mg by mouth 2 (two) times daily.  . metoprolol succinate (TOPROL-XL) 25 MG 24 hr tablet Take 25 mg by mouth daily.  . mirtazapine (REMERON) 7.5 MG tablet Take 7.5 mg by mouth at bedtime.  . nitroGLYCERIN (NITROSTAT) 0.4 MG SL tablet Place 1 tablet (0.4 mg total) under the tongue every 5 (five) minutes as needed for chest pain.  . Simethicone (PHAZYME) 180 MG CAPS Take 2 capsules (360 mg total) by mouth at bedtime.  . [DISCONTINUED] isosorbide mononitrate (IMDUR) 30 MG 24 hr tablet Take 1 tablet (30 mg total) by mouth daily.     Allergies:   Patient has no known allergies.   Social History   Socioeconomic History  . Marital status: Widowed    Spouse name: Not on file  . Number of children: Not on file  . Years of education: Not on file  . Highest education level: Not on file  Occupational History  . Occupation: Retired  Tobacco Use  . Smoking status: Former Smoker    Packs/day: 1.00    Years: 27.00    Pack years: 27.00    Types: Cigarettes    Start date: 12/30/1943    Quit date: 12/29/1970    Years since quitting: 50.4  . Smokeless tobacco: Never Used  . Tobacco comment: "stopped smoking in 1970s when I got pneumonia"  Vaping Use  . Vaping Use: Never used  Substance and Sexual Activity  . Alcohol use: Yes    Alcohol/week: 0.0 standard drinks    Comment: beer every once in awhile recommended by doctor for kidneys   . Drug use: Never  . Sexual activity: Not Currently  Other Topics Concern  . Not on file  Social History Narrative   Lives alone    Right handed   Caffeine: decaf coffee once a week or so    Social Determinants of Health   Financial Resource Strain: Not on file  Food Insecurity: Not on file  Transportation Needs: Not on file  Physical Activity: Not on file  Stress: Not on file  Social Connections: Not on file     Family History:  The patient's family  history includes Alzheimer's disease in his mother; Diabetes in his brother and maternal uncle; Diabetes Mellitus II in his brother.   ROS:   Please see the history of present illness.    ROS All other systems reviewed and are negative.   PHYSICAL EXAM:   VS:  BP 126/78   Pulse 75   Ht 6' (1.829 m)   Wt 190 lb 9.6 oz (86.5 kg)   SpO2 98%   BMI 25.85 kg/m   Physical  Exam  GEN: Well nourished, well developed, in no acute distress  Neck: no JVD, carotid bruits, or masses Cardiac:RRR; no murmurs, rubs, or gallops  Respiratory:  clear to auscultation bilaterally, normal work of breathing GI: soft, nontender, nondistended, + BS Ext: without cyanosis, clubbing, or edema, Good distal pulses bilaterally Neuro:  Alert and Oriented x 3,  Psych: euthymic mood, full affect  Wt Readings from Last 3 Encounters:  05/28/21 190 lb 9.6 oz (86.5 kg)  04/25/21 202 lb (91.6 kg)  04/18/21 201 lb (91.2 kg)      Studies/Labs Reviewed:   EKG:  EKG is  ordered today.  The ekg ordered today demonstrates NSR with old AWMI and inf MI unchanged.  Recent Labs: 04/25/2021: ALT 21; BUN 12; Creatinine, Ser 0.95; Hemoglobin 15.3; Platelets 220; Potassium 4.5; Sodium 141   Lipid Panel No results found for: CHOL, TRIG, HDL, CHOLHDL, VLDL, LDLCALC, LDLDIRECT  Additional studies/ records that were reviewed today include:   MRI lumbar spine IMPRESSION: Severe spinal stenosis at L2-3 unchanged.   Severe spinal stenosis at L3-4 has progressed due to superimposed central disc protrusion on top of disc space and facet spurring.   Moderate to severe spinal stenosis L4-5 unchanged from the prior study. Prior laminectomy on the right.   Moderate subarticular and foraminal stenosis bilaterally L5-S1 unchanged.     Electronically Signed   By: Franchot Gallo M.D.   On: 05/16/2021 08:09      Echocardiogram 03/13/2020:    1. Left ventricular ejection fraction, by estimation, is 45 to 50%. The  left  ventricle has mildly decreased function. The left ventricle  demonstrates global hypokinesis. There is mild left ventricular  hypertrophy of the septal segment. Grade  indeterminate diastolic dysfunction. Elevated left ventricular  end-diastolic pressure.   2. Right ventricular systolic function is normal. The right ventricular  size is normal.   3. The mitral valve is grossly normal. No evidence of mitral valve  regurgitation.   4. The aortic valve is tricuspid. Aortic valve regurgitation is mild. No  aortic stenosis is present.         Risk Assessment/Calculations:         ASSESSMENT:    1. Orthostatic hypotension   2. Dizziness   3. Coronary artery disease of native artery of native heart with stable angina pectoris ([place])   4. Essential hypertension   5. Hyperlipidemia, unspecified hyperlipidemia type   6. Stenosis of carotid artery, unspecified laterality   7. Interstitial pulmonary disease (HCC)      PLAN:  In order of problems listed above:  Dizziness/orthostatic hypotension-metoprolol reduced to 12.5 mg daily by PCP.  Daughter also thinks his Imdur is only a half a tablet daily but they will verify when they go home.  Blood pressure drops from 126 down to 110 with sitting up.  Has lost 10 pounds since the loss of his wife.  We will stop Imdur and see if this helps.  He also has an appointment with ENT to rule out vertigo.  Labs last month were stable.  No bleeding problems on Plavix.   Coronary artery disease: Status post CTO PCI of the mid RCA on 07/15/2018. Attempted CTO PCI of the proximal left circumflex was unsuccessful due to inability to cross with a wire.  Symptomatically stable.  Continue ASA, and Plavix.  LVEF mildly reduced at 45 to 50% (LVEF 50 to 55% by echocardiogram on 05/10/2018).   Recent lower extremity edema recently most likely from excessive sodium  intake.  2 g sodium diet discussed.  We will hold off on ordering echo at this time.    Hypertension  blood pressure has been stable at home.  He will continue to monitor with change in medicines.   Hyperlipidemia off simvastatin. No sure med list is accurate today. Daughter will check   Carotid artery disease Less than 50% ICA stenosis bilaterally by Dopplers on 03/13/2020.  Continue statin.   Interstitial lung disease followed by pulmonary.      Severe spinal stenosis-see MRI report has appt Dr. Vertell Limber  Shared Decision Making/Informed Consent        Medication Adjustments/Labs and Tests Ordered: Current medicines are reviewed at length with the patient today.  Concerns regarding medicines are outlined above.  Medication changes, Labs and Tests ordered today are listed in the Patient Instructions below. Patient Instructions   Medication Instructions:  Your physician recommends that you continue on your current medications as directed. Please refer to the Current Medication list given to you today.  Stop Taking Imdur   *If you need a refill on your cardiac medications before your next appointment, please call your pharmacy*   Lab Work: NONE   If you have labs (blood work) drawn today and your tests are completely normal, you will receive your results only by: Marland Kitchen MyChart Message (if you have MyChart) OR . A paper copy in the mail If you have any lab test that is abnormal or we need to change your treatment, we will call you to review the results.   Testing/Procedures: NONE    Follow-Up: At [facility], you and your health needs are our priority.  As part of our continuing mission to provide you with exceptional heart care, we have created designated Provider Care Teams.  These Care Teams include your primary Cardiologist (physician) and Advanced Practice Providers (APPs -  Physician Assistants and Nurse Practitioners) who all work together to provide you with the care you need, when you need it.  We recommend signing up for the patient portal called "MyChart".  Sign up  information is provided on this After Visit Summary.  MyChart is used to connect with patients for Virtual Visits (Telemedicine).  Patients are able to view lab/test results, encounter notes, upcoming appointments, etc.  Non-urgent messages can be sent to your provider as well.   To learn more about what you can do with MyChart, go to [email or web address].    Your next appointment:   6 week(s)  The format for your next appointment:   In Person  Provider:   Ermalinda Barrios, PA-C   Other Instructions Thank you for choosing [facility]!   Two Gram Sodium Diet 2000 mg  What is Sodium? Sodium is a mineral found naturally in many foods. The most significant source of sodium in the diet is table salt, which is about 40% sodium.  Processed, convenience, and preserved foods also contain a large amount of sodium.  The body needs only 500 mg of sodium daily to function,  A normal diet provides more than enough sodium even if you do not use salt.  Why Limit Sodium? A build up of sodium in the body can cause thirst, increased blood pressure, shortness of breath, and water retention.  Decreasing sodium in the diet can reduce edema and risk of heart attack or stroke associated with high blood pressure.  Keep in mind that there are many other factors involved in these health problems.  Heredity, obesity, lack of exercise, cigarette  smoking, stress and what you eat all play a role.  General Guidelines:  Do not add salt at the table or in cooking.  One teaspoon of salt contains over 2 grams of sodium.  Read food labels  Avoid processed and convenience foods  Ask your dietitian before eating any foods not dicussed in the menu planning guidelines  Consult your physician if you wish to use a salt substitute or a sodium containing medication such as antacids.  Limit milk and milk products to 16 oz (2 cups) per day.  Shopping Hints:  READ LABELS!! "Dietetic" does not necessarily mean low  sodium.  Salt and other sodium ingredients are often added to foods during processing.   Menu Planning Guidelines Food Group Choose More Often Avoid  Beverages (see also the milk group All fruit juices, low-sodium, salt-free vegetables juices, low-sodium carbonated beverages Regular vegetable or tomato juices, commercially softened water used for drinking or cooking  Breads and Cereals Enriched white, wheat, rye and pumpernickel bread, hard rolls and dinner rolls; muffins, cornbread and waffles; most dry cereals, cooked cereal without added salt; unsalted crackers and breadsticks; low sodium or homemade bread crumbs Bread, rolls and crackers with salted tops; quick breads; instant hot cereals; pancakes; commercial bread stuffing; self-rising flower and biscuit mixes; regular bread crumbs or cracker crumbs  Desserts and Sweets Desserts and sweets mad with mild should be within allowance Instant pudding mixes and cake mixes  Fats Butter or margarine; vegetable oils; unsalted salad dressings, regular salad dressings limited to 1 Tbs; light, sour and heavy cream Regular salad dressings containing bacon fat, bacon bits, and salt pork; snack dips made with instant soup mixes or processed cheese; salted nuts  Fruits Most fresh, frozen and canned fruits Fruits processed with salt or sodium-containing ingredient (some dried fruits are processed with sodium sulfites        Vegetables Fresh, frozen vegetables and low- sodium canned vegetables Regular canned vegetables, sauerkraut, pickled vegetables, and others prepared in brine; frozen vegetables in sauces; vegetables seasoned with ham, bacon or salt pork  Condiments, Sauces, Miscellaneous  Salt substitute with physician's approval; pepper, herbs, spices; vinegar, lemon or lime juice; hot pepper sauce; garlic powder, onion powder, low sodium soy sauce (1 Tbs.); low sodium condiments (ketchup, chili sauce, mustard) in limited amounts (1 tsp.) fresh ground  horseradish; unsalted tortilla chips, pretzels, potato chips, popcorn, salsa (1/4 cup) Any seasoning made with salt including garlic salt, celery salt, onion salt, and seasoned salt; sea salt, rock salt, kosher salt; meat tenderizers; monosodium glutamate; mustard, regular soy sauce, barbecue, sauce, chili sauce, teriyaki sauce, steak sauce, Worcestershire sauce, and most flavored vinegars; canned gravy and mixes; regular condiments; salted snack foods, olives, picles, relish, horseradish sauce, catsup   Food preparation: Try these seasonings Meats:    Pork Sage, onion Serve with applesauce  Chicken Poultry seasoning, thyme, parsley Serve with cranberry sauce  Lamb Curry powder, rosemary, garlic, thyme Serve with mint sauce or jelly  Veal Marjoram, basil Serve with current jelly, cranberry sauce  Beef Pepper, bay leaf Serve with dry mustard, unsalted chive butter  Fish [place] leaf, dill Serve with unsalted lemon butter, unsalted parsley butter  Vegetables:    Asparagus Lemon juice   Broccoli Lemon juice   Carrots Mustard dressing parsley, mint, nutmeg, glazed with unsalted butter and sugar   Green beans Marjoram, lemon juice, nutmeg,dill seed   Tomatoes Basil, marjoram, onion   Spice /blend for [employer]" 4 tsp ground thyme 1 tsp ground sage  3 tsp ground rosemary 4 tsp ground marjoram   Test your knowledge 1. A product that says "Salt Free" may still contain sodium. True or False 2. Garlic Powder and Hot Pepper Sauce an be used as alternative seasonings.True or False 3. Processed foods have more sodium than fresh foods.  True or False 4. Canned Vegetables have less sodium than froze True or False  WAYS TO DECREASE YOUR SODIUM INTAKE 1. Avoid the use of added salt in cooking and at the table.  Table salt (and other prepared seasonings which contain salt) is probably one of the greatest sources of sodium in the diet.  Unsalted foods can gain flavor from the sweet, sour, and butter taste  sensations of herbs and spices.  Instead of using salt for seasoning, try the following seasonings with the foods listed.  Remember: how you use them to enhance natural food flavors is limited only by your creativity... Allspice-Meat, fish, eggs, fruit, peas, red and yellow vegetables Almond Extract-Fruit baked goods Anise Seed-Sweet breads, fruit, carrots, beets, cottage cheese, cookies (tastes like licorice) Basil-Meat, fish, eggs, vegetables, rice, vegetables salads, soups, sauces Bay Leaf-Meat, fish, stews, poultry Burnet-Salad, vegetables (cucumber-like flavor) Caraway Seed-Bread, cookies, cottage cheese, meat, vegetables, cheese, rice Cardamon-Baked goods, fruit, soups Celery Powder or seed-Salads, salad dressings, sauces, meatloaf, soup, bread.Do not use  celery salt Chervil-Meats, salads, fish, eggs, vegetables, cottage cheese (parsley-like flavor) Chili Power-Meatloaf, chicken cheese, corn, eggplant, egg dishes Chives-Salads cottage cheese, egg dishes, soups, vegetables, sauces Cilantro-Salsa, casseroles Cinnamon-Baked goods, fruit, pork, lamb, chicken, carrots Cloves-Fruit, baked goods, fish, pot roast, green beans, beets, carrots Coriander-Pastry, cookies, meat, salads, cheese (lemon-orange flavor) Cumin-Meatloaf, fish,cheese, eggs, cabbage,fruit pie (caraway flavor) [employer], fruit, eggs, fish, poultry, cottage cheese, vegetables Dill Seed-Meat, cottage cheese, poultry, vegetables, fish, salads, bread Fennel Seed-Bread, cookies, apples, pork, eggs, fish, beets, cabbage, cheese, Licorice-like flavor Garlic-(buds or powder) Salads, meat, poultry, fish, bread, butter, vegetables, potatoes.Do not  use garlic salt Ginger-Fruit, vegetables, baked goods, meat, fish, poultry Horseradish Root-Meet, vegetables, butter Lemon Juice or Extract-Vegetables, fruit, tea, baked goods, fish salads Mace-Baked goods fruit, vegetables, fish, poultry (taste like nutmeg) Maple  Extract-Syrups Marjoram-Meat, chicken, fish, vegetables, breads, green salads (taste like Sage) Mint-Tea, lamb, sherbet, vegetables, desserts, carrots, cabbage Mustard, Dry or Seed-Cheese, eggs, meats, vegetables, poultry Nutmeg-Baked goods, fruit, chicken, eggs, vegetables, desserts Onion Powder-Meat, fish, poultry, vegetables, cheese, eggs, bread, rice salads (Do not use   Onion salt) Orange Extract-Desserts, baked goods Oregano-Pasta, eggs, cheese, onions, pork, lamb, fish, chicken, vegetables, green salads Paprika-Meat, fish, poultry, eggs, cheese, vegetables Parsley Flakes-Butter, vegetables, meat fish, poultry, eggs, bread, salads (certain forms may   Contain sodium Pepper-Meat fish, poultry, vegetables, eggs Peppermint Extract-Desserts, baked goods Poppy Seed-Eggs, bread, cheese, fruit dressings, baked goods, noodles, vegetables, cottage  [name], poultry, meat, fish, cauliflower, turnips,eggs bread Saffron-Rice, bread, veal, chicken, fish, eggs Sage-Meat, fish, poultry, onions, eggplant, tomateos, pork, stews Savory-Eggs, salads, poultry, meat, rice, vegetables, soups, pork Tarragon-Meat, poultry, fish, eggs, butter, vegetables (licorice-like flavor)  Thyme-Meat, poultry, fish, eggs, vegetables, (clover-like flavor), sauces, soups Tumeric-Salads, butter, eggs, fish, rice, vegetables (saffron-like flavor) Vanilla Extract-Baked goods, candy Vinegar-Salads, vegetables, meat marinades Walnut Extract-baked goods, candy  2. Choose your Foods Wisely   The following is a list of foods to avoid which are high in sodium:  Meats-Avoid all smoked, canned, salt cured, dried and kosher meat and fish as well as Anchovies   [employer] meats:Bologna, Liverwurst, Pastrami Canned meat or fish  Marinated  herring Caviar    Pepperoni Corned Beef   Pizza Dried chipped beef  Salami Frozen breaded fish or meat Salt pork Frankfurters or hot  dogs  Sardines Gefilte fish   Sausage Ham (boiled ham, Proscuitto Smoked butt    spiced ham)   Spam      TV Dinners Vegetables Canned vegetables (Regular) Relish Canned mushrooms  Sauerkraut Olives    Tomato juice Pickles  Bakery and Dessert Products Canned puddings  Cream pies Cheesecake   Decorated cakes Cookies  Beverages/Juices Tomato juice, regular  Gatorade   V-8 vegetable juice, regular  Breads and Cereals Biscuit mixes   Salted potato chips, corn chips, pretzels Bread stuffing mixes  Salted crackers and rolls Pancake and waffle mixes Self-rising flour  Seasonings Accent    Meat sauces Barbecue sauce  Meat tenderizer Catsup    Monosodium glutamate (MSG) Celery salt   Onion salt Chili sauce   Prepared mustard Garlic salt   Salt, seasoned salt, sea salt Gravy mixes   Soy sauce Horseradish   Steak sauce Ketchup   Tartar sauce Lite salt    Teriyaki sauce Marinade mixes   Worcestershire sauce  Others Baking powder   Cocoa and cocoa mixes Baking soda   Commercial casserole mixes Candy-caramels, chocolate  Dehydrated soups    Bars, fudge,nougats  Instant rice and pasta mixes Canned broth or soup  Maraschino cherries Cheese, aged and processed cheese and cheese spreads  Learning Assessment Quiz  Indicated T (for True) or F (for False) for each of the following statements:  1. _____ Fresh fruits and vegetables and unprocessed grains are generally low in sodium 2. _____ Water may contain a considerable amount of sodium, depending on the source 3. _____ You can always tell if a food is high in sodium by tasting it 4. _____ Certain laxatives my be high in sodium and should be avoided unless prescribed   by a physician or pharmacist 5. _____ Salt substitutes may be used freely by anyone on a sodium restricted diet 6. _____ Sodium is present in table salt, food additives and as a natural component of   most foods 7. _____ Table salt is approximately 90%  sodium 8. _____ Limiting sodium intake may help prevent excess fluid accumulation in the body 9. _____ On a sodium-restricted diet, seasonings such as bouillon soy sauce, and    cooking wine should be used in place of table salt 10. _____ On an ingredient list, a product which lists monosodium glutamate as the first   ingredient is an appropriate food to include on a low sodium diet  Circle the best answer(s) to the following statements (Hint: there may be more than one correct answer)  11. On a low-sodium diet, some acceptable snack items are:    A. Olives  F. Bean dip   K. Grapefruit juice    B. Salted Pretzels G. Commercial Popcorn   L. Canned peaches    C. Carrot Sticks  H. Bouillon   M. Unsalted nuts   D. [place] fries  I. Peanut butter crackers N. Salami   E. Sweet pickles J. Tomato Juice   O. Pizza  12.  Seasonings that may be used freely on a reduced - sodium diet include   A. Lemon wedges F.Monosodium glutamate K. Celery seed    B.Soysauce   G. Pepper   L. Mustard powder   C. Sea salt  H. Cooking wine  M. Onion flakes   D. Vinegar  E. Prepared  horseradish N. Salsa   E. Sage   J. Worcestershire sauce  O. [address]     Sumner Boast, PA-C  05/28/2021 12:12 PM    [facility] [place], [place], [place]  [address] Phone: [phone]; Fax: [phone]

## 2021-05-21 NOTE — Telephone Encounter (Signed)
Spoke with pt's daughter, Jimmy Knox. She stated the patient has an appt scheduled for consultation with Dr Vertell Limber. She also stated the pt's dizziness had gotten worse and last weekend was bad. They called Dr Ria Comment office and he has suggested ENT. She also stated the patient has an appt with cardiology next Tuesday (this was recommended by Dr Jaynee Eagles). She wanted Dr Jaynee Eagles to be updated. She thanked me for the call.

## 2021-05-22 ENCOUNTER — Telehealth (INDEPENDENT_AMBULATORY_CARE_PROVIDER_SITE_OTHER): Payer: Self-pay | Admitting: *Deleted

## 2021-05-22 NOTE — Telephone Encounter (Signed)
Lattie Haw, patient's daughter called and states that her Dad continues to have issues with his stomach rolling when laying flat in the bed. When he is setting/sleeping in the recliner he is fine. He is eating well , having bowel movements. Continues to complain of dizziness. He has had a MRI that shows that his back is a mess and he has appointment with Dr. Vertell Limber to further evaluate. Mr. Banton will also be seeing the cardiologist soon also.  Patient ask that the daughter call and see if Dr. Laural Golden had any suggestions. Dr. Laural Golden made aware.

## 2021-05-23 NOTE — Telephone Encounter (Signed)
Patient's daughter was called and given Dr. Olevia Perches recommendation. He has appointment with Dr. Vertell Limber at the end of June with Dr. Vertell Limber for further workup. She is calling weekly to see if they may have a cancellation sooner. She was appreciative of Dr. Olevia Perches recommendation and will tell her dad.

## 2021-05-28 ENCOUNTER — Ambulatory Visit (INDEPENDENT_AMBULATORY_CARE_PROVIDER_SITE_OTHER): Payer: Medicare Other | Admitting: Physician Assistant

## 2021-05-28 ENCOUNTER — Encounter: Payer: Self-pay | Admitting: Physician Assistant

## 2021-05-28 ENCOUNTER — Other Ambulatory Visit: Payer: Self-pay

## 2021-05-28 VITALS — BP 126/78 | HR 75 | Ht 72.0 in | Wt 190.6 lb

## 2021-05-28 DIAGNOSIS — J849 Interstitial pulmonary disease, unspecified: Secondary | ICD-10-CM | POA: Diagnosis not present

## 2021-05-28 DIAGNOSIS — E785 Hyperlipidemia, unspecified: Secondary | ICD-10-CM

## 2021-05-28 DIAGNOSIS — I951 Orthostatic hypotension: Secondary | ICD-10-CM

## 2021-05-28 DIAGNOSIS — I6529 Occlusion and stenosis of unspecified carotid artery: Secondary | ICD-10-CM

## 2021-05-28 DIAGNOSIS — R42 Dizziness and giddiness: Secondary | ICD-10-CM

## 2021-05-28 DIAGNOSIS — I25118 Atherosclerotic heart disease of native coronary artery with other forms of angina pectoris: Secondary | ICD-10-CM | POA: Diagnosis not present

## 2021-05-28 DIAGNOSIS — I10 Essential (primary) hypertension: Secondary | ICD-10-CM | POA: Diagnosis not present

## 2021-05-28 NOTE — Addendum Note (Signed)
Addended by: Levonne Hubert on: 05/28/2021 02:25 PM   Modules accepted: Orders

## 2021-05-28 NOTE — Patient Instructions (Signed)
Medication Instructions:  Your physician recommends that you continue on your current medications as directed. Please refer to the Current Medication list given to you today.  Stop Taking Imdur   *If you need a refill on your cardiac medications before your next appointment, please call your pharmacy*   Lab Work: NONE   If you have labs (blood work) drawn today and your tests are completely normal, you will receive your results only by: Marland Kitchen MyChart Message (if you have MyChart) OR . A paper copy in the mail If you have any lab test that is abnormal or we need to change your treatment, we will call you to review the results.   Testing/Procedures: NONE    Follow-Up: At [facility] you and your health needs are our priority.  As part of our continuing mission to provide you with exceptional heart care, we have created designated Provider Care Teams.  These Care Teams include your primary Cardiologist (physician) and Advanced Practice Providers (APPs -  Physician Assistants and Nurse Practitioners) who all work together to provide you with the care you need, when you need it.  We recommend signing up for the patient portal called "MyChart".  Sign up information is provided on this After Visit Summary.  MyChart is used to connect with patients for Virtual Visits (Telemedicine).  Patients are able to view lab/test results, encounter notes, upcoming appointments, etc.  Non-urgent messages can be sent to your provider as well.   To learn more about what you can do with MyChart, go to [email or web address].    Your next appointment:   6 week(s)  The format for your next appointment:   In Person  Provider:   Ermalinda Barrios, PA-C   Other Instructions Thank you for choosing [facility]!   Two Gram Sodium Diet 2000 mg  What is Sodium? Sodium is a mineral found naturally in many foods. The most significant source of sodium in the diet is table salt, which is about 40%  sodium.  Processed, convenience, and preserved foods also contain a large amount of sodium.  The body needs only 500 mg of sodium daily to function,  A normal diet provides more than enough sodium even if you do not use salt.  Why Limit Sodium? A build up of sodium in the body can cause thirst, increased blood pressure, shortness of breath, and water retention.  Decreasing sodium in the diet can reduce edema and risk of heart attack or stroke associated with high blood pressure.  Keep in mind that there are many other factors involved in these health problems.  Heredity, obesity, lack of exercise, cigarette smoking, stress and what you eat all play a role.  General Guidelines:  Do not add salt at the table or in cooking.  One teaspoon of salt contains over 2 grams of sodium.  Read food labels  Avoid processed and convenience foods  Ask your dietitian before eating any foods not dicussed in the menu planning guidelines  Consult your physician if you wish to use a salt substitute or a sodium containing medication such as antacids.  Limit milk and milk products to 16 oz (2 cups) per day.  Shopping Hints:  READ LABELS!! "Dietetic" does not necessarily mean low sodium.  Salt and other sodium ingredients are often added to foods during processing.   Menu Planning Guidelines Food Group Choose More Often Avoid  Beverages (see also the milk group All fruit juices, low-sodium, salt-free vegetables juices, low-sodium carbonated beverages Regular  vegetable or tomato juices, commercially softened water used for drinking or cooking  Breads and Cereals Enriched white, wheat, rye and pumpernickel bread, hard rolls and dinner rolls; muffins, cornbread and waffles; most dry cereals, cooked cereal without added salt; unsalted crackers and breadsticks; low sodium or homemade bread crumbs Bread, rolls and crackers with salted tops; quick breads; instant hot cereals; pancakes; commercial bread stuffing;  self-rising flower and biscuit mixes; regular bread crumbs or cracker crumbs  Desserts and Sweets Desserts and sweets mad with mild should be within allowance Instant pudding mixes and cake mixes  Fats Butter or margarine; vegetable oils; unsalted salad dressings, regular salad dressings limited to 1 Tbs; light, sour and heavy cream Regular salad dressings containing bacon fat, bacon bits, and salt pork; snack dips made with instant soup mixes or processed cheese; salted nuts  Fruits Most fresh, frozen and canned fruits Fruits processed with salt or sodium-containing ingredient (some dried fruits are processed with sodium sulfites        Vegetables Fresh, frozen vegetables and low- sodium canned vegetables Regular canned vegetables, sauerkraut, pickled vegetables, and others prepared in brine; frozen vegetables in sauces; vegetables seasoned with ham, bacon or salt pork  Condiments, Sauces, Miscellaneous  Salt substitute with physician's approval; pepper, herbs, spices; vinegar, lemon or lime juice; hot pepper sauce; garlic powder, onion powder, low sodium soy sauce (1 Tbs.); low sodium condiments (ketchup, chili sauce, mustard) in limited amounts (1 tsp.) fresh ground horseradish; unsalted tortilla chips, pretzels, potato chips, popcorn, salsa (1/4 cup) Any seasoning made with salt including garlic salt, celery salt, onion salt, and seasoned salt; sea salt, rock salt, kosher salt; meat tenderizers; monosodium glutamate; mustard, regular soy sauce, barbecue, sauce, chili sauce, teriyaki sauce, steak sauce, Worcestershire sauce, and most flavored vinegars; canned gravy and mixes; regular condiments; salted snack foods, olives, picles, relish, horseradish sauce, catsup   Food preparation: Try these seasonings Meats:    Pork Sage, onion Serve with applesauce  Chicken Poultry seasoning, thyme, parsley Serve with cranberry sauce  Lamb Curry powder, rosemary, garlic, thyme Serve with mint sauce or jelly   Veal Marjoram, basil Serve with current jelly, cranberry sauce  Beef Pepper, bay leaf Serve with dry mustard, unsalted chive butter  Fish Bay leaf, dill Serve with unsalted lemon butter, unsalted parsley butter  Vegetables:    Asparagus Lemon juice   Broccoli Lemon juice   Carrots Mustard dressing parsley, mint, nutmeg, glazed with unsalted butter and sugar   Green beans Marjoram, lemon juice, nutmeg,dill seed   Tomatoes Basil, marjoram, onion   Spice /blend for [employer]" 4 tsp ground thyme 1 tsp ground sage 3 tsp ground rosemary 4 tsp ground marjoram   Test your knowledge 1. A product that says "Salt Free" may still contain sodium. True or False 2. Garlic Powder and Hot Pepper Sauce an be used as alternative seasonings.True or False 3. Processed foods have more sodium than fresh foods.  True or False 4. Canned Vegetables have less sodium than froze True or False  WAYS TO DECREASE YOUR SODIUM INTAKE 1. Avoid the use of added salt in cooking and at the table.  Table salt (and other prepared seasonings which contain salt) is probably one of the greatest sources of sodium in the diet.  Unsalted foods can gain flavor from the sweet, sour, and butter taste sensations of herbs and spices.  Instead of using salt for seasoning, try the following seasonings with the foods listed.  Remember: how you use them to  enhance natural food flavors is limited only by your creativity... Allspice-Meat, fish, eggs, fruit, peas, red and yellow vegetables Almond Extract-Fruit baked goods Anise Seed-Sweet breads, fruit, carrots, beets, cottage cheese, cookies (tastes like licorice) Basil-Meat, fish, eggs, vegetables, rice, vegetables salads, soups, sauces Bay Leaf-Meat, fish, stews, poultry Burnet-Salad, vegetables (cucumber-like flavor) Caraway Seed-Bread, cookies, cottage cheese, meat, vegetables, cheese, rice Cardamon-Baked goods, fruit, soups Celery Powder or seed-Salads, salad dressings, sauces,  meatloaf, soup, bread.Do not use  celery salt Chervil-Meats, salads, fish, eggs, vegetables, cottage cheese (parsley-like flavor) Chili Power-Meatloaf, chicken cheese, corn, eggplant, egg dishes Chives-Salads cottage cheese, egg dishes, soups, vegetables, sauces Cilantro-Salsa, casseroles Cinnamon-Baked goods, fruit, pork, lamb, chicken, carrots Cloves-Fruit, baked goods, fish, pot roast, green beans, beets, carrots Coriander-Pastry, cookies, meat, salads, cheese (lemon-orange flavor) Cumin-Meatloaf, fish,cheese, eggs, cabbage,fruit pie (caraway flavor) [employer], fruit, eggs, fish, poultry, cottage cheese, vegetables Dill Seed-Meat, cottage cheese, poultry, vegetables, fish, salads, bread Fennel Seed-Bread, cookies, apples, pork, eggs, fish, beets, cabbage, cheese, Licorice-like flavor Garlic-(buds or powder) Salads, meat, poultry, fish, bread, butter, vegetables, potatoes.Do not  use garlic salt Ginger-Fruit, vegetables, baked goods, meat, fish, poultry Horseradish Root-Meet, vegetables, butter Lemon Juice or Extract-Vegetables, fruit, tea, baked goods, fish salads Mace-Baked goods fruit, vegetables, fish, poultry (taste like nutmeg) Maple Extract-Syrups Marjoram-Meat, chicken, fish, vegetables, breads, green salads (taste like Sage) Mint-Tea, lamb, sherbet, vegetables, desserts, carrots, cabbage Mustard, Dry or Seed-Cheese, eggs, meats, vegetables, poultry Nutmeg-Baked goods, fruit, chicken, eggs, vegetables, desserts Onion Powder-Meat, fish, poultry, vegetables, cheese, eggs, bread, rice salads (Do not use   Onion salt) Orange Extract-Desserts, baked goods Oregano-Pasta, eggs, cheese, onions, pork, lamb, fish, chicken, vegetables, green salads Paprika-Meat, fish, poultry, eggs, cheese, vegetables Parsley Flakes-Butter, vegetables, meat fish, poultry, eggs, bread, salads (certain forms may   Contain sodium Pepper-Meat fish, poultry, vegetables, eggs Peppermint  Extract-Desserts, baked goods Poppy Seed-Eggs, bread, cheese, fruit dressings, baked goods, noodles, vegetables, cottage  [name], poultry, meat, fish, cauliflower, turnips,eggs bread Saffron-Rice, bread, veal, chicken, fish, eggs Sage-Meat, fish, poultry, onions, eggplant, tomateos, pork, stews Savory-Eggs, salads, poultry, meat, rice, vegetables, soups, pork Tarragon-Meat, poultry, fish, eggs, butter, vegetables (licorice-like flavor)  Thyme-Meat, poultry, fish, eggs, vegetables, (clover-like flavor), sauces, soups Tumeric-Salads, butter, eggs, fish, rice, vegetables (saffron-like flavor) Vanilla Extract-Baked goods, candy Vinegar-Salads, vegetables, meat marinades Walnut Extract-baked goods, candy  2. Choose your Foods Wisely   The following is a list of foods to avoid which are high in sodium:  Meats-Avoid all smoked, canned, salt cured, dried and kosher meat and fish as well as Anchovies   [employer] meats:Bologna, Liverwurst, Pastrami Canned meat or fish  Marinated herring Caviar    Pepperoni Corned Beef   Pizza Dried chipped beef  Salami Frozen breaded fish or meat Salt pork Frankfurters or hot dogs  Sardines Gefilte fish   Sausage Ham (boiled ham, Proscuitto Smoked butt    spiced ham)   Spam      TV Dinners Vegetables Canned vegetables (Regular) Relish Canned mushrooms  Sauerkraut Olives    Tomato juice Pickles  Bakery and Dessert Products Canned puddings  Cream pies Cheesecake   Decorated cakes Cookies  Beverages/Juices Tomato juice, regular  Gatorade   V-8 vegetable juice, regular  Breads and Cereals Biscuit mixes   Salted potato chips, corn chips, pretzels Bread stuffing mixes  Salted crackers and rolls Pancake and waffle mixes Self-rising flour  Seasonings Accent    Meat sauces Barbecue sauce  Meat tenderizer Catsup    Monosodium glutamate (MSG)  Celery salt   Onion salt Chili sauce   Prepared  mustard Garlic salt   Salt, seasoned salt, sea salt Gravy mixes   Soy sauce Horseradish   Steak sauce Ketchup   Tartar sauce Lite salt    Teriyaki sauce Marinade mixes   Worcestershire sauce  Others Baking powder   Cocoa and cocoa mixes Baking soda   Commercial casserole mixes Candy-caramels, chocolate  Dehydrated soups    Bars, fudge,nougats  Instant rice and pasta mixes Canned broth or soup  Maraschino cherries Cheese, aged and processed cheese and cheese spreads  Learning Assessment Quiz  Indicated T (for True) or F (for False) for each of the following statements:  1. _____ Fresh fruits and vegetables and unprocessed grains are generally low in sodium 2. _____ Water may contain a considerable amount of sodium, depending on the source 3. _____ You can always tell if a food is high in sodium by tasting it 4. _____ Certain laxatives my be high in sodium and should be avoided unless prescribed   by a physician or pharmacist 5. _____ Salt substitutes may be used freely by anyone on a sodium restricted diet 6. _____ Sodium is present in table salt, food additives and as a natural component of   most foods 7. _____ Table salt is approximately 90% sodium 8. _____ Limiting sodium intake may help prevent excess fluid accumulation in the body 9. _____ On a sodium-restricted diet, seasonings such as bouillon soy sauce, and    cooking wine should be used in place of table salt 10. _____ On an ingredient list, a product which lists monosodium glutamate as the first   ingredient is an appropriate food to include on a low sodium diet  Circle the best answer(s) to the following statements (Hint: there may be more than one correct answer)  11. On a low-sodium diet, some acceptable snack items are:    A. Olives  F. Bean dip   K. Grapefruit juice    B. Salted Pretzels G. Commercial Popcorn   L. Canned peaches    C. Carrot Sticks  H. Bouillon   M. Unsalted nuts   D. [place] fries  I. Peanut  butter crackers N. Salami   E. Sweet pickles J. Tomato Juice   O. Pizza  12.  Seasonings that may be used freely on a reduced - sodium diet include   A. Lemon wedges F.Monosodium glutamate K. Celery seed    B.Soysauce   G. Pepper   L. Mustard powder   C. Sea salt  H. Cooking wine  M. Onion flakes   D. Vinegar  E. Prepared horseradish N. Salsa   E. Sage   J. Worcestershire sauce  O. Chutney

## 2021-06-10 ENCOUNTER — Telehealth: Payer: Self-pay | Admitting: Physician Assistant

## 2021-06-10 NOTE — Telephone Encounter (Signed)
I spoke with daughter.She said after his Imdur was stopped 2 weeks ago, he had 1 day where his dizziness was one. He was even able to drive his car. However since then he has days where he has dizziness to the point of being "drunk". She describes dizziness from getting out of bed in the am, bending over, rising from a chair. His BP is in the 120's /70's and daughter states HR is "fine".   He has an appointment to see ENT later this week.

## 2021-06-10 NOTE — Telephone Encounter (Signed)
I spoke with daughter and they will stop patients metoprolol.

## 2021-06-10 NOTE — Telephone Encounter (Signed)
New Message    Patient daughter called to let Ermalinda Barrios know that the patient is still having the spells of dizziness even after decreasing medication

## 2021-06-13 DIAGNOSIS — I739 Peripheral vascular disease, unspecified: Secondary | ICD-10-CM | POA: Diagnosis not present

## 2021-06-17 ENCOUNTER — Telehealth: Payer: Self-pay | Admitting: Physician Assistant

## 2021-06-17 NOTE — Telephone Encounter (Signed)
Jenene Slicker called to let Cathey/Michele [facility]nell Public know that Mr. Hellen is still having dizziness. He saw ENT Dr. on 06-13-21. The ENT is Dr. Raphael Gibney at [facility]. He put him on Pentoxifyliline 100mg  3x daily w/meals. Dr. Raphael Gibney  said it would couple months before they would know if this medication will work. She said she can be reached at #[phone]([facility]) or  #[phone]([facility]).

## 2021-06-17 NOTE — Telephone Encounter (Signed)
Pt spouse just wanted to let Ermalinda Barrios, PA-C know that the ENT pt saw in [place] thinks that pt's dizziness could be coming from a blocked artery. Just FYI.

## 2021-06-26 NOTE — Progress Notes (Deleted)
Cardiology Office Note    Date:  06/26/2021   ID:  Jimmy Knox, DOB March 15, 1932, MRN [id number]   PCP:  Asencion Noble, [place]  Cardiologist:  None *** Advanced Practice Provider:  No care team member to display Electrophysiologist:  None   [id number]}   No chief complaint on file.   History of Present Illness:  Jimmy Knox is a 87 y.o. male with history of coronary artery disease. He underwent CTO PCI of the mid RCA with a drug-eluting stent on 07/15/2018.  He underwent unsuccessful CTO PCI of the proximal left circumflex due to inability to cross with a wire. Also has HTN, HLD, carotid disease,  history of  exertional dyspnea and ILD on CT followed by pulmonary.   Referred to pulmonary for ILD. Echo 02/2020 EF 45-50% mild LVH  I saw the patient 05/28/21 with dizziness referred by neurology. He has severe multilevel central stenosis in his low back. Dr. Willey Blade recommends ENT evaluation. MRI 05/14/21 severe spinal stenosis-see below for details.  Patient had lost 10 pounds since his wife died in 02-01-2023.  He was eating a very high salt diet.  Had had some leg swelling that resolved. He was orthostatic and I stopped Imdur, labs were ok.        Past Medical History:  Diagnosis Date   Anxiety    Arthritis    "right hip" (07/15/2018)   Carotid artery disease ([place])    a. <50% bilaterally 05/2016.   Chronic combined systolic and diastolic CHF (congestive heart failure) ([place])    Complication of anesthesia    "didn't wake up well after OR on 07/30/2016; took 2-3 to hold me down"   Coronary artery disease    a. prior MI/stent around 2000. b. Nuc 09/2015 -> abnl, mgmd medically.   Facial neuralgia    History of blood transfusion 07/30/2016   "after the operation"   Hyperlipidemia    Hypertension    Ischemic cardiomyopathy    Melanoma of nose ([place])    "inside on the right side"   Myocardial infarction ([place]) ~ 2000   Pneumonia 1935- 1970s  ,"several times"   [facility] fever 1942   Skin cancer    "on my back; arms; froze them off"   Type 2 diabetes, diet controlled ([place])     Past Surgical History:  Procedure Laterality Date   APPENDECTOMY     BACK SURGERY     CATARACT EXTRACTION, BILATERAL Bilateral    COLONOSCOPY N/A 09/28/2015   Procedure: COLONOSCOPY;  Surgeon: Rogene Houston, MD;  [facility];  Service: Endoscopy;  Laterality: N/A;  955   CORONARY ANGIOPLASTY WITH STENT PLACEMENT  2000   CORONARY CTO INTERVENTION  07/15/2018   CORONARY CTO INTERVENTION N/A 07/15/2018   Procedure: CORONARY CTO INTERVENTION;  Surgeon: Martinique, Peter M, MD;  Location: [facility];  Service: Cardiovascular;  Laterality: N/A;   CYSTOSCOPY W/ URETERAL STENT PLACEMENT     EYE SURGERY Right 2013   "got hit by a limb & knocked my eye out"   JOINT REPLACEMENT     LEFT HEART CATH AND CORONARY ANGIOGRAPHY N/A 06/15/2018   Procedure: LEFT HEART CATH AND CORONARY ANGIOGRAPHY;  Surgeon: Martinique, Peter M, MD;  Location: [facility];  Service: Cardiovascular;  Laterality: N/A;   LUMBAR [place] Right    "inside my nose"   TONSILLECTOMY  TOTAL HIP ARTHROPLASTY Right 07/29/2016   Procedure: TOTAL HIP ARTHROPLASTY;  Surgeon: Garald Balding, MD;  Location: [facility];  Service: Orthopedics;  Laterality: Right;    Current Medications: No outpatient medications have been marked as taking for the 07/08/21 encounter (Appointment) with Jimmy Burn, PA-C.     Allergies:   Patient has no known allergies.   Social History   Socioeconomic History   Marital status: Widowed    Spouse name: Not on file   Number of children: Not on file   Years of education: Not on file   Highest education level: Not on file  Occupational History   Occupation: Retired  Tobacco Use   Smoking status: Former    Packs/day: 1.00    Years: 27.00    Pack years: 27.00    Types: Cigarettes    Start date:  12/30/1943    Quit date: 12/29/1970    Years since quitting: 50.5   Smokeless tobacco: Never   Tobacco comments:    "stopped smoking in 1970s when I got pneumonia"  Vaping Use   Vaping Use: Never used  Substance and Sexual Activity   Alcohol use: Yes    Alcohol/week: 0.0 standard drinks    Comment: beer every once in awhile recommended by doctor for kidneys    Drug use: Never   Sexual activity: Not Currently  Other Topics Concern   Not on file  Social History Narrative   Lives alone    Right handed   Caffeine: decaf coffee once a week or so    Social Determinants of Health   Financial Resource Strain: Not on file  Food Insecurity: Not on file  Transportation Needs: Not on file  Physical Activity: Not on file  Stress: Not on file  Social Connections: Not on file     Family History:  The patient's ***family history includes Alzheimer's disease in his mother; Diabetes in his brother and maternal uncle; Diabetes Mellitus II in his brother.   ROS:   Please see the history of present illness.    ROS All other systems reviewed and are negative.   PHYSICAL EXAM:   VS:  There were no vitals taken for this visit.  Physical Exam  GEN: Well nourished, well developed, in no acute distress  HEENT: normal  Neck: no JVD, carotid bruits, or masses Cardiac:RRR; no murmurs, rubs, or gallops  Respiratory:  clear to auscultation bilaterally, normal work of breathing GI: soft, nontender, nondistended, + BS Ext: without cyanosis, clubbing, or edema, Good distal pulses bilaterally MS: no deformity or atrophy  Skin: warm and dry, no rash Neuro:  Alert and Oriented x 3, Strength and sensation are intact Psych: euthymic mood, full affect  Wt Readings from Last 3 Encounters:  05/28/21 190 lb 9.6 oz (86.5 kg)  04/25/21 202 lb (91.6 kg)  04/18/21 201 lb (91.2 kg)      Studies/Labs Reviewed:   EKG:  EKG is*** ordered today.  The ekg ordered today demonstrates ***  Recent  Labs: 04/25/2021: ALT 21; BUN 12; Creatinine, Ser 0.95; Hemoglobin 15.3; Platelets 220; Potassium 4.5; Sodium 141   Lipid Panel No results found for: CHOL, TRIG, HDL, CHOLHDL, VLDL, LDLCALC, LDLDIRECT  Additional studies/ records that were reviewed today include:  MRI lumbar spine IMPRESSION: Severe spinal stenosis at L2-3 unchanged.   Severe spinal stenosis at L3-4 has progressed due to superimposed central disc protrusion on top of disc space and facet spurring.   Moderate to severe spinal stenosis  L4-5 unchanged from the prior study. Prior laminectomy on the right.   Moderate subarticular and foraminal stenosis bilaterally L5-S1 unchanged.     Electronically Signed   By: Franchot Gallo M.D.   On: 05/16/2021 08:09       Echocardiogram 03/13/2020:    1. Left ventricular ejection fraction, by estimation, is 45 to 50%. The  left ventricle has mildly decreased function. The left ventricle  demonstrates global hypokinesis. There is mild left ventricular  hypertrophy of the septal segment. Grade  indeterminate diastolic dysfunction. Elevated left ventricular  end-diastolic pressure.   2. Right ventricular systolic function is normal. The right ventricular  size is normal.   3. The mitral valve is grossly normal. No evidence of mitral valve  regurgitation.   4. The aortic valve is tricuspid. Aortic valve regurgitation is mild. No  aortic stenosis is present.         Risk Assessment/Calculations:   {Does this patient have ATRIAL FIBRILLATION?[phone]}     ASSESSMENT:    No diagnosis found.   PLAN:  In order of problems listed above:  Dizziness/orthostatic hypotension-metoprolol reduced to 12.5 mg daily by PCP.  I stopped Imdur. He also has an appointment with ENT to rule out vertigo.     Coronary artery disease: Status post CTO PCI of the mid RCA on 07/15/2018. Attempted CTO PCI of the proximal left circumflex was unsuccessful due to inability to cross with a  wire.  Symptomatically stable.  Continue ASA, and Plavix.  LVEF mildly reduced at 45 to 50% (LVEF 50 to 55% by echocardiogram on 05/10/2018).    Recent lower extremity edema recently most likely from excessive sodium intake.  2 g sodium diet discussed.  We will hold off on ordering echo at this time.    Hypertension blood pressure has been stable at home.  He will continue to monitor with change in medicines.    Hyperlipidemia off simvastatin. No sure med list is accurate today. Daughter will check   Carotid artery disease Less than 50% ICA stenosis bilaterally by Dopplers on 03/13/2020.  Continue statin.   Interstitial lung disease followed by pulmonary.      Severe spinal stenosis-see MRI report has appt Dr. Vertell Limber  Shared Decision Making/Informed Consent   {Are you ordering a CV Procedure (e.g. stress test, cath, DCCV, TEE, etc)?   Press F2        :[id number]}    Medication Adjustments/Labs and Tests Ordered: Current medicines are reviewed at length with the patient today.  Concerns regarding medicines are outlined above.  Medication changes, Labs and Tests ordered today are listed in the Patient Instructions below. There are no Patient Instructions on file for this visit.   Sumner Boast, PA-C  06/26/2021 10:30 AM    [facility] [address], [place], [place]  [address] Phone: [phone]; Fax: [phone]

## 2021-07-04 DIAGNOSIS — L304 Erythema intertrigo: Secondary | ICD-10-CM | POA: Diagnosis not present

## 2021-07-04 DIAGNOSIS — E1159 Type 2 diabetes mellitus with other circulatory complications: Secondary | ICD-10-CM | POA: Diagnosis not present

## 2021-07-04 DIAGNOSIS — L820 Inflamed seborrheic keratosis: Secondary | ICD-10-CM | POA: Diagnosis not present

## 2021-07-04 DIAGNOSIS — C44519 Basal cell carcinoma of skin of other part of trunk: Secondary | ICD-10-CM | POA: Diagnosis not present

## 2021-07-04 DIAGNOSIS — X32XXXD Exposure to sunlight, subsequent encounter: Secondary | ICD-10-CM | POA: Diagnosis not present

## 2021-07-04 DIAGNOSIS — Z79899 Other long term (current) drug therapy: Secondary | ICD-10-CM | POA: Diagnosis not present

## 2021-07-04 DIAGNOSIS — L570 Actinic keratosis: Secondary | ICD-10-CM | POA: Diagnosis not present

## 2021-07-08 ENCOUNTER — Ambulatory Visit: Payer: Medicare Other | Admitting: Physician Assistant

## 2021-07-09 DIAGNOSIS — I2510 Atherosclerotic heart disease of native coronary artery without angina pectoris: Secondary | ICD-10-CM | POA: Diagnosis not present

## 2021-07-09 DIAGNOSIS — F320 Major depressive disorder, single episode, mild: Secondary | ICD-10-CM | POA: Diagnosis not present

## 2021-07-09 DIAGNOSIS — E785 Hyperlipidemia, unspecified: Secondary | ICD-10-CM | POA: Diagnosis not present

## 2021-07-09 DIAGNOSIS — E1122 Type 2 diabetes mellitus with diabetic chronic kidney disease: Secondary | ICD-10-CM | POA: Diagnosis not present

## 2021-07-16 NOTE — Progress Notes (Deleted)
Cardiology Office Note    Date:  07/16/2021   ID:  Jimmy Knox, DOB 05/26/1932, MRN [id number]   PCP:  Asencion Noble, [place]  Cardiologist:  None *** Advanced Practice Provider:  No care team member to display Electrophysiologist:  None   [id number]}   No chief complaint on file.   History of Present Illness:  Jimmy Knox is a 87 y.o. male with history of coronary artery disease. He underwent CTO PCI of the mid RCA with a drug-eluting stent on 07/15/2018.  He underwent unsuccessful CTO PCI of the proximal left circumflex due to inability to cross with a wire. Also has HTN, HLD, carotid disease,  history of  exertional dyspnea and ILD on CT followed by pulmonary.   Patient seen 04/12/20 and referred to pulmonary for ILD. Echo 02/2020 EF 45-50% mild LVH   Is referred back by neurology for recent dizziness when leaning over and metoprolol reduced. He has severe multilevel central stenosis in his low back.  MRI 05/14/21 severe spinal stenosis-see below for details.  I saw the patient 05/28/2021 and he was still orthostatic.  Had lost 10 pounds since he lost his wife.  I stopped Imdur and also stopped metoprolol when they called in with ongoing dizziness.  Patient was seen by ENT at [facility] in [place] and was started on pentoxifylline 100 mg 3 times daily with meals.  He was told it would take a couple months before they would know if it is working.      Past Medical History:  Diagnosis Date   Anxiety    Arthritis    "right hip" (07/15/2018)   Carotid artery disease ([place])    a. <50% bilaterally 05/2016.   Chronic combined systolic and diastolic CHF (congestive heart failure) ([place])    Complication of anesthesia    "didn't wake up well after OR on 07/30/2016; took 2-3 to hold me down"   Coronary artery disease    a. prior MI/stent around 2000. b. Nuc 09/2015 -> abnl, mgmd medically.   Facial neuralgia    History of blood transfusion  07/30/2016   "after the operation"   Hyperlipidemia    Hypertension    Ischemic cardiomyopathy    Melanoma of nose ([place])    "inside on the right side"   Myocardial infarction ([place]) ~ 2000   Pneumonia 1935- 1970s ,"several times"   [facility] spotted fever 1942   Skin cancer    "on my back; arms; froze them off"   Type 2 diabetes, diet controlled ([place])     Past Surgical History:  Procedure Laterality Date   APPENDECTOMY     BACK SURGERY     CATARACT EXTRACTION, BILATERAL Bilateral    COLONOSCOPY N/A 09/28/2015   Procedure: COLONOSCOPY;  Surgeon: Rogene Houston, MD;  [facility];  Service: Endoscopy;  Laterality: N/A;  955   CORONARY ANGIOPLASTY WITH STENT PLACEMENT  2000   CORONARY CTO INTERVENTION  07/15/2018   CORONARY CTO INTERVENTION N/A 07/15/2018   Procedure: CORONARY CTO INTERVENTION;  Surgeon: Martinique, Peter M, MD;  Location: [facility];  Service: Cardiovascular;  Laterality: N/A;   CYSTOSCOPY W/ URETERAL STENT PLACEMENT     EYE SURGERY Right 2013   "got hit by a limb & knocked my eye out"   JOINT REPLACEMENT     LEFT HEART CATH AND CORONARY ANGIOGRAPHY N/A 06/15/2018   Procedure: LEFT HEART CATH AND CORONARY  ANGIOGRAPHY;  Surgeon: Martinique, Peter M, MD;  Location: [facility];  Service: Cardiovascular;  Laterality: N/A;   LUMBAR [place] Right    "inside my nose"   TONSILLECTOMY     TOTAL HIP ARTHROPLASTY Right 07/29/2016   Procedure: TOTAL HIP ARTHROPLASTY;  Surgeon: Garald Balding, MD;  Location: [facility];  Service: Orthopedics;  Laterality: Right;    Current Medications: No outpatient medications have been marked as taking for the 07/29/21 encounter (Appointment) with Imogene Burn, PA-C.     Allergies:   Patient has no known allergies.   Social History   Socioeconomic History   Marital status: Widowed    Spouse name: Not on file   Number of children: Not on file   Years of education: Not on file    Highest education level: Not on file  Occupational History   Occupation: Retired  Tobacco Use   Smoking status: Former    Packs/day: 1.00    Years: 27.00    Pack years: 27.00    Types: Cigarettes    Start date: 12/30/1943    Quit date: 12/29/1970    Years since quitting: 50.5   Smokeless tobacco: Never   Tobacco comments:    "stopped smoking in 1970s when I got pneumonia"  Vaping Use   Vaping Use: Never used  Substance and Sexual Activity   Alcohol use: Yes    Alcohol/week: 0.0 standard drinks    Comment: beer every once in awhile recommended by doctor for kidneys    Drug use: Never   Sexual activity: Not Currently  Other Topics Concern   Not on file  Social History Narrative   Lives alone    Right handed   Caffeine: decaf coffee once a week or so    Social Determinants of Health   Financial Resource Strain: Not on file  Food Insecurity: Not on file  Transportation Needs: Not on file  Physical Activity: Not on file  Stress: Not on file  Social Connections: Not on file     Family History:  The patient's ***family history includes Alzheimer's disease in his mother; Diabetes in his brother and maternal uncle; Diabetes Mellitus II in his brother.   ROS:   Please see the history of present illness.    ROS All other systems reviewed and are negative.   PHYSICAL EXAM:   VS:  There were no vitals taken for this visit.  Physical Exam  GEN: Well nourished, well developed, in no acute distress  HEENT: normal  Neck: no JVD, carotid bruits, or masses Cardiac:RRR; no murmurs, rubs, or gallops  Respiratory:  clear to auscultation bilaterally, normal work of breathing GI: soft, nontender, nondistended, + BS Ext: without cyanosis, clubbing, or edema, Good distal pulses bilaterally MS: no deformity or atrophy  Skin: warm and dry, no rash Neuro:  Alert and Oriented x 3, Strength and sensation are intact Psych: euthymic mood, full affect  Wt Readings from Last 3 Encounters:   05/28/21 190 lb 9.6 oz (86.5 kg)  04/25/21 202 lb (91.6 kg)  04/18/21 201 lb (91.2 kg)      Studies/Labs Reviewed:   EKG:  EKG is*** ordered today.  The ekg ordered today demonstrates ***  Recent Labs: 04/25/2021: ALT 21; BUN 12; Creatinine, Ser 0.95; Hemoglobin 15.3; Platelets 220; Potassium 4.5; Sodium 141   Lipid Panel No results found for: CHOL, TRIG, HDL, CHOLHDL, VLDL, LDLCALC, LDLDIRECT  Additional studies/ records that were  reviewed today include:  MRI lumbar spine IMPRESSION: Severe spinal stenosis at L2-3 unchanged.   Severe spinal stenosis at L3-4 has progressed due to superimposed central disc protrusion on top of disc space and facet spurring.   Moderate to severe spinal stenosis L4-5 unchanged from the prior study. Prior laminectomy on the right.   Moderate subarticular and foraminal stenosis bilaterally L5-S1 unchanged.     Electronically Signed   By: Franchot Gallo M.D.   On: 05/16/2021 08:09       Echocardiogram 03/13/2020:    1. Left ventricular ejection fraction, by estimation, is 45 to 50%. The  left ventricle has mildly decreased function. The left ventricle  demonstrates global hypokinesis. There is mild left ventricular  hypertrophy of the septal segment. Grade  indeterminate diastolic dysfunction. Elevated left ventricular  end-diastolic pressure.   2. Right ventricular systolic function is normal. The right ventricular  size is normal.   3. The mitral valve is grossly normal. No evidence of mitral valve  regurgitation.   4. The aortic valve is tricuspid. Aortic valve regurgitation is mild. No  aortic stenosis is present.         Risk Assessment/Calculations:   {Does this patient have ATRIAL FIBRILLATION?[phone]}     ASSESSMENT:    No diagnosis found.   PLAN:  In order of problems listed above:  Dizziness/orthostatic hypotension-metoprolol  and Imdur stopped.  ENT started him on pentoxifylline 100 mg 3 times daily    Coronary artery disease: Status post CTO PCI of the mid RCA on 07/15/2018. Attempted CTO PCI of the proximal left circumflex was unsuccessful due to inability to cross with a wire.  Symptomatically stable.  Continue ASA, and Plavix.  LVEF mildly reduced at 45 to 50% (LVEF 50 to 55% by echocardiogram on 05/10/2018).    Recent lower extremity edema recently most likely from excessive sodium intake.  2 g sodium diet discussed.  We will hold off on ordering echo at this time.    Hypertension blood pressure has been stable at home.  He will continue to monitor with change in medicines.    Hyperlipidemia off simvastatin. No sure med list is accurate today. Daughter will check   Carotid artery disease Less than 50% ICA stenosis bilaterally by Dopplers on 03/13/2020.  Continue statin.   Interstitial lung disease followed by pulmonary.      Severe spinal stenosis-see MRI report has appt Dr. Vertell Limber  Shared Decision Making/Informed Consent   {Are you ordering a CV Procedure (e.g. stress test, cath, DCCV, TEE, etc)?   Press F2        :[id number]}    Medication Adjustments/Labs and Tests Ordered: Current medicines are reviewed at length with the patient today.  Concerns regarding medicines are outlined above.  Medication changes, Labs and Tests ordered today are listed in the Patient Instructions below. There are no Patient Instructions on file for this visit.   Sumner Boast, PA-C  07/16/2021 11:48 AM    [facility] [place], [place], [place]  [address] Phone: [phone]; Fax: [phone]

## 2021-07-19 ENCOUNTER — Telehealth (INDEPENDENT_AMBULATORY_CARE_PROVIDER_SITE_OTHER): Payer: Self-pay | Admitting: Internal Medicine

## 2021-07-19 NOTE — Telephone Encounter (Signed)
Dr. Willey Blade requested office visit. Talk with patient's daughter Helene Kelp. He does not have good appetite.  He is losing weight.  He feels as if food is not digesting and he feels bloated.  Now he is having abdominal pain during the daytime. He was last seen 3 months ago. Will arrange for office visit next week.

## 2021-07-25 ENCOUNTER — Ambulatory Visit (INDEPENDENT_AMBULATORY_CARE_PROVIDER_SITE_OTHER): Payer: Medicare Other | Admitting: Internal Medicine

## 2021-07-25 ENCOUNTER — Encounter (INDEPENDENT_AMBULATORY_CARE_PROVIDER_SITE_OTHER): Payer: Self-pay | Admitting: Internal Medicine

## 2021-07-25 ENCOUNTER — Other Ambulatory Visit: Payer: Self-pay

## 2021-07-25 VITALS — BP 100/65 | HR 103 | Temp 98.3°F | Ht 72.0 in | Wt 193.7 lb

## 2021-07-25 DIAGNOSIS — K5900 Constipation, unspecified: Secondary | ICD-10-CM | POA: Diagnosis not present

## 2021-07-25 DIAGNOSIS — R634 Abnormal weight loss: Secondary | ICD-10-CM | POA: Diagnosis not present

## 2021-07-25 DIAGNOSIS — I6529 Occlusion and stenosis of unspecified carotid artery: Secondary | ICD-10-CM | POA: Diagnosis not present

## 2021-07-25 MED ORDER — POLYETHYLENE GLYCOL 3350 17 GM/SCOOP PO POWD: 8.5000 g | Freq: Every day | ORAL | 0 refills | Status: AC

## 2021-07-25 NOTE — Progress Notes (Signed)
Presenting complaint;  Follow for constipation pain and weight loss.  Database and subjective:  Patient is 87 year old Caucasian male who was seen on 03/19/2021 for nausea and constipation.  He was seen in emergency room 8 days earlier for abdominal pain felt to be due to urolithiasis.  On that visit he weighed 202  pounds.  Patient reported his baseline weight to be around 210 pounds. Patient returns for follow-up visit on 04/18/2021.  He was feeling much better.  His nausea had resolved.  Nausea was felt to be due to urolithiasis.  He was doing better with low-dose polyethylene glycol.  I recommended using it every other day.  His weight was 190 pounds.  Weight loss was felt to be due to protracted nausea.  On that visit he was complaining of abdominal pressure and periumbilical region which only occurred at night when he would lie flat.  I felt this was referred back.  He was to return for follow-up visit in 3 months. Dr. Willey Blade saw patient about 2 weeks ago and was wondering if any further evaluation is needed.  Patient is accompanied by his daughter Lattie Haw.  He has no complaints.  He states his appetite is good.  He generally eats 1-2 meals and the other meals are smaller snacks.  His daughter Lattie Haw states he ate at [employer] last night and he ate very well.  He denies nausea.  He is not having abdominal pressure anymore.  He complains of constipation.  States he has not had a bowel movement in 4 to 5 days.  He is not using polyethylene glycol as recommended. He remains with numbness involving both legs up to level of knees.  He is also having gait difficulty.  He has been evaluated by Dr. Lavell Anchors of neurology and underwent brain and lumbar spine MRI.  Patient has given up driving because of neurologic symptoms.  He is using walker to move around.  Current Medications: Outpatient Encounter Medications as of 07/25/2021  Medication Sig   amitriptyline (ELAVIL) 50 MG tablet Take 50 mg by mouth at  bedtime.   aspirin EC 81 MG tablet Take 81 mg by mouth daily.   clopidogrel (PLAVIX) 75 MG tablet TAKE 1 TABLET BY MOUTH ONCE DAILY. (Patient taking differently: Take 75 mg by mouth daily.)   cyanocobalamin (,VITAMIN B-12,) 1000 MCG/ML injection Inject 1000 mcg IM once weekly for 4 weeks then once monthly for 6 months.   gabapentin (NEURONTIN) 300 MG capsule Take 300 mg by mouth 2 (two) times daily.   nitroGLYCERIN (NITROSTAT) 0.4 MG SL tablet Place 1 tablet (0.4 mg total) under the tongue every 5 (five) minutes as needed for chest pain.   pentoxifylline (TRENTAL) 400 MG CR tablet Take 400 mg by mouth 3 (three) times daily with meals.   rosuvastatin (CRESTOR) 10 MG tablet Take 10 mg by mouth daily.   Simethicone (PHAZYME) 180 MG CAPS Take 2 capsules (360 mg total) by mouth at bedtime. (Patient not taking: Reported on 07/25/2021)   [DISCONTINUED] mirtazapine (REMERON) 7.5 MG tablet Take 7.5 mg by mouth at bedtime.   No facility-administered encounter medications on file as of 07/25/2021.     Objective: Blood pressure 100/65, pulse (!) 103, temperature 98.3 F (36.8 C), temperature source Oral, height 6' (1.829 m), weight 193 lb 11.2 oz (87.9 kg). Patient is alert and in no acute distress.  Conjunctiva is pink. Sclera is nonicteric Oropharyngeal mucosa is normal. No neck masses or thyromegaly noted. Cardiac exam with regular rhythm  normal S1 and S2. No murmur or gallop noted. Lungs are clear to auscultation. Abdomen abdomen is symmetrical.  On palpation is soft and nontender with organomegaly or masses. No LE edema or clubbing noted.  Labs/studies Results:   CBC Latest Ref Rng & Units 04/25/2021 03/12/2021 03/11/2021  WBC 3.4 - 10.8 x10E3/uL 10.9(H) 10.6(H) 12.5(H)  Hemoglobin 13.0 - 17.7 g/dL 15.3 15.8 16.1  Hematocrit 37.5 - 51.0 % 45.1 47.4 49.2  Platelets 150 - 450 x10E3/uL 220 231 216    CMP Latest Ref Rng & Units 04/25/2021 03/12/2021 03/11/2021  Glucose 65 - 99 mg/dL 106(H) 123(H)  146(H)  BUN 8 - 27 mg/dL _0 Creatinine 0.76 - 1.27 mg/dL 0.95 1.08 1.00  Sodium 134 - 144 mmol/L 141 135 135  Potassium 3.5 - 5.2 mmol/L 4.5 4.3 4.1  Chloride 96 - 106 mmol/L 104 102 101  CO2 20 - 29 mmol/L _1 Calcium 8.6 - 10.2 mg/dL 9.3 9.1 9.2  Total Protein 6.0 - 8.5 g/dL 7.2 8.1 7.7  Total Bilirubin 0.0 - 1.2 mg/dL 0.7 1.3(H) 1.1  Alkaline Phos 44 - 121 IU/L 97 76 79  AST 0 - 40 IU/L _2 ALT 0 - 44 IU/L _3 Hepatic Function Latest Ref Rng & Units 04/25/2021 03/12/2021 03/11/2021  Total Protein 6.0 - 8.5 g/dL 7.2 8.1 7.7  Albumin 3.6 - 4.6 g/dL 4.4 3.9 3.9  AST 0 - 40 IU/L _4 ALT 0 - 44 IU/L _5 Alk Phosphatase 44 - 121 IU/L 97 76 79  Total Bilirubin 0.0 - 1.2 mg/dL 0.7 1.3(H) 1.1     Assessment:  #1.  Constipation.  He is constipated again but he has not been using polyethylene glycol as recommended.  He could also benefit by increasing intake of fiber rich foods such as eating apple every day and also eat almonds and walnuts and snacks.  He needs to go back to taking polyethylene glycol on scheduled which can be daily or every other day.  #2.  Weight loss.  He has lost 11 pounds between his March and April visit.  He has stopped losing weight.  He actually has gained 3 pounds.  Weight loss was felt to be due to protracted nausea resulting from urolithiasis which is not an issue anymore.  #3.  History of vague abdominal discomfort felt to be referred pain from his back.  He is not having this symptom anymore.  Abdominal examination is benign.  Therefore no further work-up indicated.  Plan:  Patient advised to eat 3 meals a day and maybe 1 or 2 snacks in between. He will incorporate fiber rich foods in his diet as discussed. He should take polyethylene glycol on schedule which can be continued every other day.  He is about to titrate the dose between 8.5 to 17 g each time. If he does not have spontaneous bowel movement by end of the day he  can use Dulcolax suppository. Office visit in 3 months.

## 2021-07-25 NOTE — Patient Instructions (Signed)
Dietary measures as discussed. Can titrate MiraLAX dose as discussed.  Please take it on schedule whether it is daily or every other day. Can use Dulcolax suppository if you have not had a bowel movement by end of the day today.

## 2021-07-29 ENCOUNTER — Ambulatory Visit: Payer: Medicare Other | Admitting: Physician Assistant

## 2021-08-01 ENCOUNTER — Encounter (INDEPENDENT_AMBULATORY_CARE_PROVIDER_SITE_OTHER): Payer: Self-pay

## 2021-08-20 ENCOUNTER — Ambulatory Visit (INDEPENDENT_AMBULATORY_CARE_PROVIDER_SITE_OTHER): Payer: Medicare Other | Admitting: Internal Medicine

## 2021-08-21 DIAGNOSIS — H1045 Other chronic allergic conjunctivitis: Secondary | ICD-10-CM | POA: Diagnosis not present

## 2021-08-26 DIAGNOSIS — Z85828 Personal history of other malignant neoplasm of skin: Secondary | ICD-10-CM | POA: Diagnosis not present

## 2021-08-26 DIAGNOSIS — Z08 Encounter for follow-up examination after completed treatment for malignant neoplasm: Secondary | ICD-10-CM | POA: Diagnosis not present

## 2021-10-08 ENCOUNTER — Telehealth (INDEPENDENT_AMBULATORY_CARE_PROVIDER_SITE_OTHER): Payer: Self-pay | Admitting: *Deleted

## 2021-10-08 NOTE — Telephone Encounter (Signed)
Pt last seen 07/25/21. Pt's daughter Lattie Haw called to report pt is still having some stomach issues. States he does not have much of an appetite for last 3 days. He did eat an egg and cheese biscuit today, a bowl of cereal yesterday and ate a plate 2 days ago at [employer]. He is drinking liquids good. States he is having abdominal pain around navel that goes out to around rib cage only when lying down. States it happens after he goes to sleep and pain wakes him. Not taking anything for pain. Taking otc famotidine that has helped abdominal pain some but none the past 3 days.

## 2021-10-09 NOTE — Telephone Encounter (Signed)
Per dr Laural Golden can repeat CT scan that was done in march or do EGD or can do both. And would like to know what pt weights.  I called and spoke with daughter and she states his weight is between 197-200 right now. She will speak with him and let me know what he decides on doing.

## 2021-10-09 NOTE — Telephone Encounter (Signed)
Daughter Lattie Haw called back and she talked with father and he would like to proceed with EGD. Any day is good EXCEPT nov 3rd or 4th.

## 2021-10-10 ENCOUNTER — Other Ambulatory Visit (INDEPENDENT_AMBULATORY_CARE_PROVIDER_SITE_OTHER): Payer: Self-pay

## 2021-10-10 DIAGNOSIS — R110 Nausea: Secondary | ICD-10-CM

## 2021-10-10 DIAGNOSIS — K5900 Constipation, unspecified: Secondary | ICD-10-CM

## 2021-10-10 DIAGNOSIS — R109 Unspecified abdominal pain: Secondary | ICD-10-CM

## 2021-10-10 DIAGNOSIS — R634 Abnormal weight loss: Secondary | ICD-10-CM

## 2021-10-10 NOTE — Patient Instructions (Signed)
[facility]  10/10/2021     @PREFPERIOPPHARMACY @   Your procedure is scheduled on  10/16/2021.   Report to [facility] at  1000 A.M.    Call this number if you have problems the morning of surgery:  [phone]   Remember:  Follow the diet instructions given to you by the office.    Take these medicines the morning of surgery with A SIP OF WATER                                    gabapentin.     Do not wear jewelry, make-up or nail polish.  Do not wear lotions, powders, or perfumes, or deodorant.  Do not shave 48 hours prior to surgery.  Men may shave face and neck.  Do not bring valuables to the hospital.  [facility] is not responsible for any belongings or valuables.  Contacts, dentures or bridgework may not be worn into surgery.  Leave your suitcase in the car.  After surgery it may be brought to your room.  For patients admitted to the hospital, discharge time will be determined by your treatment team.  Patients discharged the day of surgery will not be allowed to drive home and must have someone with them for 24 hours.    Special instructions:  DO NOT smoke tobacco or vape for 24 hours before your procedure.  Please read over the following fact sheets that you were given. Anesthesia Post-op Instructions and Care and Recovery After Surgery      Upper Endoscopy, Adult, Care After This sheet gives you information about how to care for yourself after your procedure. Your health care provider may also give you more specific instructions. If you have problems or questions, contact your health care provider. What can I expect after the procedure? After the procedure, it is common to have: A sore throat. Mild stomach pain or discomfort. Bloating. Nausea. Follow these instructions at home:  Follow instructions from your health care provider about what to eat or drink after your procedure. Return to your normal activities as told by your health care  provider. Ask your health care provider what activities are safe for you. Take over-the-counter and prescription medicines only as told by your health care provider. If you were given a sedative during the procedure, it can affect you for several hours. Do not drive or operate machinery until your health care provider says that it is safe. Keep all follow-up visits as told by your health care provider. This is important. Contact a health care provider if you have: A sore throat that lasts longer than one day. Trouble swallowing. Get help right away if: You vomit blood or your vomit looks like coffee grounds. You have: A fever. Bloody, black, or tarry stools. A severe sore throat or you cannot swallow. Difficulty breathing. Severe pain in your chest or abdomen. Summary After the procedure, it is common to have a sore throat, mild stomach discomfort, bloating, and nausea. If you were given a sedative during the procedure, it can affect you for several hours. Do not drive or operate machinery until your health care provider says that it is safe. Follow instructions from your health care provider about what to eat or drink after your procedure. Return to your normal activities as told by your health care provider. This information is not intended to replace advice  given to you by your health care provider. Make sure you discuss any questions you have with your health care provider. Document Revised: 12/13/2019 Document Reviewed: 05/17/2018 Elsevier Patient Education  2022 [employer] After This sheet gives you information about how to care for yourself after your procedure. Your health care provider may also give you more specific instructions. If you have problems or questions, contact your health care provider. What can I expect after the procedure? After the procedure, it is common to have: Tiredness. Forgetfulness about what happened after the  procedure. Impaired judgment for important decisions. Nausea or vomiting. Some difficulty with balance. Follow these instructions at home: For the time period you were told by your health care provider:   Rest as needed. Do not participate in activities where you could fall or become injured. Do not drive or use machinery. Do not drink alcohol. Do not take sleeping pills or medicines that cause drowsiness. Do not make important decisions or sign legal documents. Do not take care of children on your own. Eating and drinking Follow the diet that is recommended by your health care provider. Drink enough fluid to keep your urine pale yellow. If you vomit: Drink water, juice, or soup when you can drink without vomiting. Make sure you have little or no nausea before eating solid foods. General instructions Have a responsible adult stay with you for the time you are told. It is important to have someone help care for you until you are awake and alert. Take over-the-counter and prescription medicines only as told by your health care provider. If you have sleep apnea, surgery and certain medicines can increase your risk for breathing problems. Follow instructions from your health care provider about wearing your sleep device: Anytime you are sleeping, including during daytime naps. While taking prescription pain medicines, sleeping medicines, or medicines that make you drowsy. Avoid smoking. Keep all follow-up visits as told by your health care provider. This is important. Contact a health care provider if: You keep feeling nauseous or you keep vomiting. You feel light-headed. You are still sleepy or having trouble with balance after 24 hours. You develop a rash. You have a fever. You have redness or swelling around the IV site. Get help right away if: You have trouble breathing. You have new-onset confusion at home. Summary For several hours after your procedure, you may feel tired.  You may also be forgetful and have poor judgment. Have a responsible adult stay with you for the time you are told. It is important to have someone help care for you until you are awake and alert. Rest as told. Do not drive or operate machinery. Do not drink alcohol or take sleeping pills. Get help right away if you have trouble breathing, or if you suddenly become confused. This information is not intended to replace advice given to you by your health care provider. Make sure you discuss any questions you have with your health care provider. Document Revised: 08/30/2020 Document Reviewed: 11/17/2019 Elsevier Patient Education  2022 [employer].

## 2021-10-11 ENCOUNTER — Encounter (INDEPENDENT_AMBULATORY_CARE_PROVIDER_SITE_OTHER): Payer: Self-pay

## 2021-10-14 ENCOUNTER — Encounter (HOSPITAL_COMMUNITY)
Admission: RE | Admit: 2021-10-14 | Discharge: 2021-10-14 | Disposition: A | Discharge status: Home / Self Care | Payer: Medicare Other | Source: Ambulatory Visit | Attending: Internal Medicine | Admitting: Internal Medicine

## 2021-10-14 ENCOUNTER — Other Ambulatory Visit: Payer: Self-pay

## 2021-10-14 ENCOUNTER — Encounter (HOSPITAL_COMMUNITY): Payer: Self-pay

## 2021-10-14 DIAGNOSIS — Z01812 Encounter for preprocedural laboratory examination: Secondary | ICD-10-CM | POA: Insufficient documentation

## 2021-10-14 LAB — BASIC METABOLIC PANEL
Anion gap: 4 — ABNORMAL LOW (ref 5–15)
BUN: 18 mg/dL (ref 8–23)
CO2: 25 mmol/L (ref 22–32)
Calcium: 8.9 mg/dL (ref 8.9–10.3)
Chloride: 106 mmol/L (ref 98–111)
Creatinine, Ser: 1.03 mg/dL (ref 0.61–1.24)
GFR, Estimated: >60 mL/min (ref >60)
Glucose, Bld: 142 mg/dL — ABNORMAL HIGH (ref 70–99)
Potassium: 4.2 mmol/L (ref 3.5–5.1)
Sodium: 135 mmol/L (ref 135–145)

## 2021-10-16 ENCOUNTER — Encounter (HOSPITAL_COMMUNITY)
Admission: RE | Disposition: A | Discharge status: Home / Self Care | Payer: Self-pay | Source: Ambulatory Visit | Attending: Internal Medicine

## 2021-10-16 ENCOUNTER — Encounter (HOSPITAL_COMMUNITY): Payer: Self-pay | Admitting: Internal Medicine

## 2021-10-16 ENCOUNTER — Ambulatory Visit (HOSPITAL_COMMUNITY): Payer: Medicare Other | Admitting: Anesthesiology

## 2021-10-16 ENCOUNTER — Ambulatory Visit (HOSPITAL_COMMUNITY)
Admission: RE | Admit: 2021-10-16 | Discharge: 2021-10-16 | Disposition: A | Discharge status: Home / Self Care | Payer: Medicare Other | Source: Ambulatory Visit | Attending: Internal Medicine | Admitting: Internal Medicine

## 2021-10-16 ENCOUNTER — Other Ambulatory Visit: Payer: Self-pay

## 2021-10-16 DIAGNOSIS — K5900 Constipation, unspecified: Secondary | ICD-10-CM | POA: Diagnosis not present

## 2021-10-16 DIAGNOSIS — I2510 Atherosclerotic heart disease of native coronary artery without angina pectoris: Secondary | ICD-10-CM | POA: Diagnosis not present

## 2021-10-16 DIAGNOSIS — R140 Abdominal distension (gaseous): Secondary | ICD-10-CM | POA: Insufficient documentation

## 2021-10-16 DIAGNOSIS — K5730 Diverticulosis of large intestine without perforation or abscess without bleeding: Secondary | ICD-10-CM | POA: Insufficient documentation

## 2021-10-16 DIAGNOSIS — R1010 Upper abdominal pain, unspecified: Secondary | ICD-10-CM | POA: Insufficient documentation

## 2021-10-16 DIAGNOSIS — Z6825 Body mass index (BMI) 25.0-25.9, adult: Secondary | ICD-10-CM | POA: Insufficient documentation

## 2021-10-16 DIAGNOSIS — R634 Abnormal weight loss: Secondary | ICD-10-CM | POA: Diagnosis not present

## 2021-10-16 DIAGNOSIS — K2970 Gastritis, unspecified, without bleeding: Secondary | ICD-10-CM | POA: Insufficient documentation

## 2021-10-16 DIAGNOSIS — R110 Nausea: Secondary | ICD-10-CM

## 2021-10-16 DIAGNOSIS — K2960 Other gastritis without bleeding: Secondary | ICD-10-CM | POA: Diagnosis not present

## 2021-10-16 DIAGNOSIS — N200 Calculus of kidney: Secondary | ICD-10-CM | POA: Diagnosis not present

## 2021-10-16 DIAGNOSIS — Z87891 Personal history of nicotine dependence: Secondary | ICD-10-CM | POA: Insufficient documentation

## 2021-10-16 DIAGNOSIS — B9681 Helicobacter pylori [H. pylori] as the cause of diseases classified elsewhere: Secondary | ICD-10-CM | POA: Diagnosis not present

## 2021-10-16 DIAGNOSIS — Z7982 Long term (current) use of aspirin: Secondary | ICD-10-CM | POA: Insufficient documentation

## 2021-10-16 DIAGNOSIS — K2289 Other specified disease of esophagus: Secondary | ICD-10-CM | POA: Diagnosis not present

## 2021-10-16 DIAGNOSIS — R109 Unspecified abdominal pain: Secondary | ICD-10-CM

## 2021-10-16 HISTORY — PX: ESOPHAGOGASTRODUODENOSCOPY (EGD) WITH PROPOFOL: SHX5813

## 2021-10-16 HISTORY — PX: BIOPSY: SHX5522

## 2021-10-16 LAB — GLUCOSE, CAPILLARY: Glucose-Capillary: 133 mg/dL — ABNORMAL HIGH (ref 70–99)

## 2021-10-16 SURGERY — ESOPHAGOGASTRODUODENOSCOPY (EGD) WITH PROPOFOL
Anesthesia: General

## 2021-10-16 MED ORDER — PROPOFOL 500 MG/50ML IV EMUL
INTRAVENOUS | Status: DC | PRN
  Administered 2021-10-16: 150 ug/kg/min via INTRAVENOUS

## 2021-10-16 MED ORDER — LACTATED RINGERS IV SOLN: INTRAVENOUS | Status: DC

## 2021-10-16 MED ORDER — PHENYLEPHRINE HCL (PRESSORS) 10 MG/ML IV SOLN
INTRAVENOUS | Status: DC | PRN
  Administered 2021-10-16: 120 ug via INTRAVENOUS

## 2021-10-16 NOTE — Anesthesia Preprocedure Evaluation (Signed)
Anesthesia Evaluation  Patient identified by MRN, date of birth, ID band Patient awake    Reviewed: Allergy & Precautions, H&P , NPO status , Patient's Chart, lab work & pertinent test results, reviewed documented beta blocker date and time   Airway Mallampati: II  TM Distance: >3 FB Neck ROM: full    Dental no notable dental hx.    Pulmonary neg pulmonary ROS, former smoker,    Pulmonary exam normal breath sounds clear to auscultation       Cardiovascular Exercise Tolerance: Good hypertension, + CAD and + Past MI   Rhythm:regular Rate:Normal     Neuro/Psych PSYCHIATRIC DISORDERS Anxiety negative neurological ROS     GI/Hepatic negative GI ROS, Neg liver ROS,   Endo/Other  negative endocrine ROSdiabetes, Type 2  Renal/GU negative Renal ROS  negative genitourinary   Musculoskeletal   Abdominal   Peds  Hematology negative hematology ROS (+)   Anesthesia Other Findings 1. Left ventricular ejection fraction, by estimation, is 45 to 50%. The  left ventricle has mildly decreased function. The left ventricle  demonstrates global hypokinesis. There is mild left ventricular  hypertrophy of the septal segment. Grade  indeterminate diastolic dysfunction. Elevated left ventricular  end-diastolic pressure.  2. Right ventricular systolic function is normal. The right ventricular  size is normal.  3. The mitral valve is grossly normal. No evidence of mitral valve  regurgitation.  4. The aortic valve is tricuspid. Aortic valve regurgitation is mild. No  aortic stenosis is present.   Reproductive/Obstetrics negative OB ROS                             Anesthesia Physical Anesthesia Plan  ASA: 3  Anesthesia Plan: General   Post-op Pain Management:    Induction:   PONV Risk Score and Plan: Propofol infusion  Airway Management Planned:   Additional Equipment:   Intra-op Plan:    Post-operative Plan:   Informed Consent: I have reviewed the patients History and Physical, chart, labs and discussed the procedure including the risks, benefits and alternatives for the proposed anesthesia with the patient or authorized representative who has indicated his/her understanding and acceptance.     Dental Advisory Given  Plan Discussed with: CRNA  Anesthesia Plan Comments:         Anesthesia Quick Evaluation

## 2021-10-16 NOTE — Anesthesia Postprocedure Evaluation (Signed)
Anesthesia Post Note  Patient: Jimmy Knox  Procedure(s) Performed: ESOPHAGOGASTRODUODENOSCOPY (EGD) WITH PROPOFOL BIOPSY  Patient location during evaluation: Phase II Anesthesia Type: General Level of consciousness: awake Pain management: pain level controlled Vital Signs Assessment: post-procedure vital signs reviewed and stable Respiratory status: spontaneous breathing and respiratory function stable Cardiovascular status: blood pressure returned to baseline and stable Postop Assessment: no headache and no apparent nausea or vomiting Anesthetic complications: no Comments: Late entry   No notable events documented.   Last Vitals:  Vitals:   10/16/21 1244 10/16/21 1249  BP: (!) 98/57 97/63  Pulse: 62 60  Resp: 16 15  Temp:    SpO2: 96% 97%    Last Pain:  Vitals:   10/16/21 1253  TempSrc:   PainSc: 0-No pain                 Louann Sjogren

## 2021-10-16 NOTE — Discharge Instructions (Addendum)
No aspirin or NSAIDs for 24 hours Resume other medications and diet as before No driving for 24 hours Physician will call with biopsy results and further recommendations.

## 2021-10-16 NOTE — Transfer of Care (Signed)
Immediate Anesthesia Transfer of Care Note  Patient: Jimmy Knox  Procedure(s) Performed: ESOPHAGOGASTRODUODENOSCOPY (EGD) WITH PROPOFOL BIOPSY  Patient Location: PACU  Anesthesia Type:General  Level of Consciousness: awake and patient cooperative  Airway & Oxygen Therapy: Patient Spontanous Breathing  Post-op Assessment: Report given to RN, Post -op Vital signs reviewed and stable and Patient moving all extremities X 4  Post vital signs: Reviewed and stable  Last Vitals:  Vitals Value Taken Time  BP 102/62 10/16/21 1241  Temp 36.6 C 10/16/21 1241  Pulse    Resp 18 10/16/21 1241  SpO2 97 % 10/16/21 1241    Last Pain:  Vitals:   10/16/21 1241  TempSrc: Axillary  PainSc:          Complications: No notable events documented.

## 2021-10-16 NOTE — H&P (Signed)
Jimmy Knox is an 87 y.o. male.   Chief Complaint: Patient is here for esophagogastroduodenoscopy HPI:  patient is 87 year old Caucasian male who has been experiencing upper abdominal pain for over 6 months.  He has had poor appetite and 15 pound weight loss.   he feels bloated all the time.  He denies nausea vomiting melena or rectal bleeding.  He has constipation and he gets satisfactory results with polyethylene glycol.  He had abdominal pelvic CT back in March 2022 when he was seen in emergency room for abdominal pain and he had bilateral renal calculi without obstruction and colonic diverticulosis. He is on low-dose aspirin.  He does not take any other OTC NSAIDs.  Past Medical History:  Diagnosis Date   Anxiety    Arthritis    "right hip" (07/15/2018)   Carotid artery disease ([place])    a. <50% bilaterally 05/2016.   Chronic combined systolic and diastolic CHF (congestive heart failure) ([place])    Complication of anesthesia    "didn't wake up well after OR on 07/30/2016; took 2-3 to hold me down"   Coronary artery disease    a. prior MI/stent around 2000. b. Nuc 09/2015 -> abnl, mgmd medically.   Facial neuralgia    History of blood transfusion 07/30/2016   "after the operation"   Hyperlipidemia    Hypertension    Ischemic cardiomyopathy    Melanoma of nose ([place])    "inside on the right side"   Myocardial infarction ([place]) ~ 2000   Pneumonia 1935- 1970s ,"several times"   [facility] spotted fever 1942   Skin cancer    "on my back; arms; froze them off"   Type 2 diabetes, diet controlled ([place])     Past Surgical History:  Procedure Laterality Date   APPENDECTOMY     BACK SURGERY     CATARACT EXTRACTION, BILATERAL Bilateral    COLONOSCOPY N/A 09/28/2015   Procedure: COLONOSCOPY;  Surgeon: Rogene [place], MD;  [facility];  Service: Endoscopy;  Laterality: N/A;  955   CORONARY ANGIOPLASTY WITH STENT PLACEMENT  2000   CORONARY CTO INTERVENTION  07/15/2018    CORONARY CTO INTERVENTION N/A 07/15/2018   Procedure: CORONARY CTO INTERVENTION;  Surgeon: Martinique, Peter M, MD;  Location: [place] [facility];  Service: Cardiovascular;  Laterality: N/A;   CYSTOSCOPY W/ URETERAL STENT PLACEMENT     EYE SURGERY Right 2013   "got hit by a limb & knocked my eye out"   JOINT REPLACEMENT     LEFT HEART CATH AND CORONARY ANGIOGRAPHY N/A 06/15/2018   Procedure: LEFT HEART CATH AND CORONARY ANGIOGRAPHY;  Surgeon: Martinique, Peter M, MD;  Location: [place] [facility];  Service: Cardiovascular;  Laterality: N/A;   LUMBAR [place] Right    "inside my nose"   TONSILLECTOMY     TOTAL HIP ARTHROPLASTY Right 07/29/2016   Procedure: TOTAL HIP ARTHROPLASTY;  Surgeon: Garald Balding, MD;  Location: [place];  Service: Orthopedics;  Laterality: Right;    Family History  Problem Relation Age of Onset   Diabetes Mellitus II Brother        x2 brothers   Alzheimer's disease Mother    Diabetes Brother    Diabetes Maternal Uncle    Social History:  reports that he quit smoking about 50 years ago. His smoking use included cigarettes. He started smoking about 77 years ago. He has a 27.00 pack-year smoking history. He has  never used smokeless tobacco. He reports current alcohol use. He reports that he does not use drugs.  Allergies: No Known Allergies  Medications Prior to Admission  Medication Sig Dispense Refill   aspirin EC 81 MG tablet Take 81 mg by mouth in the morning.     clopidogrel (PLAVIX) 75 MG tablet TAKE 1 TABLET BY MOUTH ONCE DAILY. (Patient taking differently: Take 75 mg by mouth every evening.) 90 tablet 1   gabapentin (NEURONTIN) 300 MG capsule Take 300 mg by mouth 2 (two) times daily.     polyethylene glycol powder (GLYCOLAX/MIRALAX) 17 GM/SCOOP powder Take 8.5 g by mouth daily. (Patient taking differently: Take 8.5 g by mouth daily as needed (constipation.).) 255 g 0   rosuvastatin (CRESTOR) 10 MG tablet Take 10 mg by mouth at bedtime.      cyanocobalamin (,VITAMIN B-12,) 1000 MCG/ML injection Inject 1000 mcg IM once weekly for 4 weeks then once monthly for 6 months. (Patient not taking: Reported on 10/15/2021) 1 mL 9   nitroGLYCERIN (NITROSTAT) 0.4 MG SL tablet Place 1 tablet (0.4 mg total) under the tongue every 5 (five) minutes as needed for chest pain. 25 tablet 2    Results for orders placed or performed during the hospital encounter of 10/16/21 (from the past 48 hour(s))  Glucose, capillary     Status: Abnormal   Collection Time: 10/16/21 10:01 AM  Result Value Ref Range   Glucose-Capillary 133 (H) 70 - 99 mg/dL    Comment: Glucose reference range applies only to samples taken after fasting for at least 8 hours.   No results found.  Review of Systems  Blood pressure (!) 144/83, pulse 77, temperature 97.8 F (36.6 C), temperature source Oral, resp. rate 19, SpO2 98 %. Physical Exam HENT:     Mouth/Throat:     Mouth: Mucous membranes are moist.     Pharynx: Oropharynx is clear.  Eyes:     General: No scleral icterus.    Conjunctiva/sclera: Conjunctivae normal.  Cardiovascular:     Rate and Rhythm: Normal rate and regular rhythm.     Heart sounds: Normal heart sounds. No murmur heard. Pulmonary:     Effort: Pulmonary effort is normal.     Breath sounds: Normal breath sounds.  Abdominal:     General: There is no distension.     Palpations: Abdomen is soft. There is no mass.     Tenderness: There is no abdominal tenderness.  Musculoskeletal:        General: No swelling.     Cervical back: Neck supple.  Lymphadenopathy:     Cervical: No cervical adenopathy.  Skin:    General: Skin is warm and dry.  Neurological:     Mental Status: He is alert.     Assessment/Plan    Upper abdominal pain bloating and weight loss.   Diagnostic esophagogastroduodenoscopy.  Jimmy Laser, MD 10/16/2021, 12:20 PM

## 2021-10-16 NOTE — Op Note (Signed)
[facility] Patient Name: Jimmy Knox Procedure Date: 10/16/2021 11:55 AM MRN: [id number] Date of Birth: 1932-08-23 Attending MD: Hildred Laser , MD CSN: [id number] Age: 87 Admit Type: Outpatient Procedure:                Upper GI endoscopy Indications:              Upper abdominal pain, Abdominal bloating, Weight                            loss Providers:                Hildred Laser, MD, Janeece Riggers, RN, Aram Candela Referring MD:             Asencion Noble, MD Medicines:                Propofol per Anesthesia Complications:            No immediate complications. Estimated Blood Loss:     Estimated blood loss was minimal. Procedure:                Pre-Anesthesia Assessment:                           - Prior to the procedure, a History and Physical                            was performed, and patient medications and                            allergies were reviewed. The patient's tolerance of                            previous anesthesia was also reviewed. The risks                            and benefits of the procedure and the sedation                            options and risks were discussed with the patient.                            All questions were answered, and informed consent                            was obtained. Prior Anticoagulants: The patient has                            taken no previous anticoagulant or antiplatelet                            agents except for aspirin. ASA Grade Assessment: II                            - A patient with mild systemic disease. After  reviewing the risks and benefits, the patient was                            deemed in satisfactory condition to undergo the                            procedure.                           After obtaining informed consent, the endoscope was                            passed under direct vision. Throughout the                            procedure, the patient's  blood pressure, pulse, and                            oxygen saturations were monitored continuously. The                            [id number]-[id number] ([id number]) scope was introduced through the                            mouth, and advanced to the second part of duodenum.                            The upper GI endoscopy was accomplished without                            difficulty. The patient tolerated the procedure                            well. Scope In: 12:27:46 PM Scope Out: 12:34:54 PM Total Procedure Duration: 0 hours 7 minutes 8 seconds  Findings:      The examined esophagus was normal.      The Z-line was irregular and was found 40 cm from the incisors.      Patchy mild inflammation characterized by congestion (edema), erythema       and friability was found in the gastric body and in the gastric antrum.       Biopsies were taken with a cold forceps for histology. The pathology       specimen from gastric antrum was placed into Bottle Number 1. The       pathology specimen from gastric body was placed into Bottle Number 2.      The cardia, gastric fundus and pylorus were normal.      The duodenal bulb and second portion of the duodenum were normal. Impression:               - Normal esophagus.                           - Z-line irregular, 40 cm from the incisors.                           -  Gastritis. Biopsied.                           - Normal cardia, gastric fundus and pylorus.                           - Normal duodenal bulb and second portion of the                            duodenum. Moderate Sedation:      Per Anesthesia Care Recommendation:           - Patient has a contact number available for                            emergencies. The signs and symptoms of potential                            delayed complications were discussed with the                            patient. Return to normal activities tomorrow.                            Written discharge instructions  were provided to the                            patient.                           - Resume previous diet today.                           - Continue present medications.                           - No aspirin, ibuprofen, naproxen, or other                            non-steroidal anti-inflammatory drugs for 1 day.                           - Await pathology results. Procedure Code(s):        --- Professional ---                           [id number], Esophagogastroduodenoscopy, flexible,                            transoral; with biopsy, single or multiple Diagnosis Code(s):        --- Professional ---                           K22.8, Other specified diseases of esophagus                           K29.70, Gastritis, unspecified, without bleeding  R10.10, Upper abdominal pain, unspecified                           R14.0, Abdominal distension (gaseous)                           R63.4, Abnormal weight loss CPT copyright 2019 American Medical Association. All rights reserved. The codes documented in this report are preliminary and upon coder review may  be revised to meet current compliance requirements. Hildred Laser, MD Hildred Laser, MD 10/16/2021 12:45:49 PM This report has been signed electronically. Number of Addenda: 0

## 2021-10-18 ENCOUNTER — Encounter (HOSPITAL_COMMUNITY): Payer: Self-pay | Admitting: Internal Medicine

## 2021-10-18 LAB — SURGICAL PATHOLOGY

## 2021-10-19 ENCOUNTER — Other Ambulatory Visit (INDEPENDENT_AMBULATORY_CARE_PROVIDER_SITE_OTHER): Payer: Self-pay | Admitting: Internal Medicine

## 2021-10-19 MED ORDER — PANTOPRAZOLE SODIUM 40 MG PO TBEC: 40.0000 mg | DELAYED_RELEASE_TABLET | Freq: Two times a day (BID) | ORAL | 0 refills | Status: DC

## 2021-10-19 MED ORDER — PYLERA 140-125-125 MG PO CAPS: 3.0000 | ORAL_CAPSULE | Freq: Three times a day (TID) | ORAL | 0 refills | Status: DC

## 2021-10-21 ENCOUNTER — Telehealth (INDEPENDENT_AMBULATORY_CARE_PROVIDER_SITE_OTHER): Payer: Self-pay | Admitting: *Deleted

## 2021-10-21 NOTE — Telephone Encounter (Signed)
Pt's daughter called Lattie Haw and states tetracycline was going to be $190 that dr Laural Golden prescribed for hpylori. Pt has covid and taking paxlovid and states they want to wait til he is done with that med before starting new med.  [place].

## 2021-10-22 NOTE — Telephone Encounter (Signed)
Per dr Laural Golden. Take tetracycline 500mg  tid for 10 days, flagyl 500mg  tid for 10 days and pepto 2 tabs tid for 10 days.

## 2021-10-23 NOTE — Telephone Encounter (Signed)
Called pharm and was told tetracycline 500 would still be 190 split up. I checked good rx and pt can get tetracycline fir 47.04 at [facility] with good rx card. Called and spoke with pt's daughter and she states she will just pick up from [employer] and pay 190. I let her know if she changes her mind to call back and I will sent to [facility] so it would be cheaper. She states she will let me know if she changes her mind.

## 2021-11-11 ENCOUNTER — Telehealth: Payer: Self-pay | Admitting: Physician Assistant

## 2021-11-11 NOTE — Telephone Encounter (Signed)
Pt c/o medication issue:  1. Name of Medication:  gabapentin (NEURONTIN) 300 MG capsule aspirin EC 81 MG tablet  2. How are you currently taking this medication (dosage and times per day)? As prescribed   3. Are you having a reaction (difficulty breathing--STAT)? No   4. What is your medication issue? Lattie Haw is calling wanting to know what heart medications Froylan needs to be on. She states the only two she is currently taking is aspirin and gabapentin. He has been taking off all other heart related meds. Please advise.

## 2021-11-12 NOTE — Telephone Encounter (Signed)
Left message for Jimmy Knox to return call.

## 2021-11-13 NOTE — Telephone Encounter (Signed)
Left a message for a call back regarding an appt.

## 2021-11-13 NOTE — Telephone Encounter (Signed)
Spoke to  [place] who stated that pt is currently only taking ASA 81 mg tablets and Nitro 0.4 SL mg tablets PRN.  Per Jerilynn Mages. Lenze's last office note, pt was to take Plavix 75 mg tablets and Crestor 10 mg, but pt has  been stopped by Dr. Laural Golden and Dr. Willey Blade.   Please advise.

## 2021-11-15 NOTE — Telephone Encounter (Signed)
Pt's daughter notified and verbalized understanding. Pt's daughter will ask about schedule and call us back.

## 2021-11-26 ENCOUNTER — Encounter (INDEPENDENT_AMBULATORY_CARE_PROVIDER_SITE_OTHER): Payer: Self-pay | Admitting: Internal Medicine

## 2021-11-26 ENCOUNTER — Ambulatory Visit (INDEPENDENT_AMBULATORY_CARE_PROVIDER_SITE_OTHER): Payer: Medicare Other | Admitting: Internal Medicine

## 2021-11-26 ENCOUNTER — Other Ambulatory Visit: Payer: Self-pay

## 2021-11-26 VITALS — BP 102/67 | HR 83 | Temp 97.6°F | Ht 72.0 in | Wt 191.4 lb

## 2021-11-26 DIAGNOSIS — B9681 Helicobacter pylori [H. pylori] as the cause of diseases classified elsewhere: Secondary | ICD-10-CM | POA: Diagnosis not present

## 2021-11-26 DIAGNOSIS — R140 Abdominal distension (gaseous): Secondary | ICD-10-CM

## 2021-11-26 DIAGNOSIS — R634 Abnormal weight loss: Secondary | ICD-10-CM

## 2021-11-26 DIAGNOSIS — I6529 Occlusion and stenosis of unspecified carotid artery: Secondary | ICD-10-CM

## 2021-11-26 DIAGNOSIS — K2970 Gastritis, unspecified, without bleeding: Secondary | ICD-10-CM | POA: Diagnosis not present

## 2021-11-26 MED ORDER — SIMETHICONE 180 MG PO CAPS: 180.0000 mg | ORAL_CAPSULE | Freq: Two times a day (BID) | ORAL | 0 refills | Status: DC | PRN

## 2021-11-26 NOTE — Progress Notes (Signed)
Presenting complaint;  Follow-up for abdominal pain and weight loss. History of constipation.  Database and subjective:  Patient is 87 year old Caucasian male who is here for scheduled visit accompanied by his daughter Lattie Haw. He underwent esophagogastroduodenoscopy on 10/16/2021 for poor appetite 15 pound weight loss in intermittent upper abdominal pain.  EGD revealed H. pylori gastritis but no evidence of peptic ulcer disease.  Patient was treated for Pylera for 10 days along with PPI.  Patient says he is not having abdominal pain anymore but he continues to experience bloating after every meal he does experience mild nausea but no heaving or vomiting.  This sensation can last for 2 hours.  He also has noted if he eats his supper after his 6 PM he experiences upset stomach.  He says his appetite is good.  Bowels are moving daily.  He denies melena or rectal bleeding.  He is using MiraLAX. He wants to know if H. pylori infection has been successfully treated.   Current Medications: Outpatient Encounter Medications as of 11/26/2021  Medication Sig   aspirin EC 81 MG tablet Take 1 tablet (81 mg total) by mouth in the morning.   gabapentin (NEURONTIN) 300 MG capsule Take 300 mg by mouth 2 (two) times daily.   nitroGLYCERIN (NITROSTAT) 0.4 MG SL tablet Place 1 tablet (0.4 mg total) under the tongue every 5 (five) minutes as needed for chest pain.   polyethylene glycol powder (GLYCOLAX/MIRALAX) 17 GM/SCOOP powder Take 8.5 g by mouth daily. (Patient taking differently: Take 8.5 g by mouth daily as needed (constipation.).)   [DISCONTINUED] bismuth-metronidazole-tetracycline (PYLERA) 140-125-125 MG capsule Take 3 capsules by mouth 4 (four) times daily -  before meals and at bedtime.   [DISCONTINUED] pantoprazole (PROTONIX) 40 MG tablet Take 1 tablet (40 mg total) by mouth 2 (two) times daily before a meal.   [DISCONTINUED] rosuvastatin (CRESTOR) 10 MG tablet Take 10 mg by mouth at bedtime.   No  facility-administered encounter medications on file as of 11/26/2021.     Objective: Blood pressure 102/67, pulse 83, temperature 97.6 F (36.4 C), temperature source Oral, height 6' (1.829 m), weight 191 lb 6.4 oz (86.8 kg). Patient is alert and in no acute distress. Conjunctiva is pink. Sclera is nonicteric Oropharyngeal mucosa is normal. No neck masses or thyromegaly noted. Cardiac exam with regular rhythm normal S1 and S2. No murmur or gallop noted. Lungs are clear to auscultation. Abdomen is symmetrical soft and nontender with organomegaly or masses. No LE edema or clubbing noted.  Labs/studies Results:   CBC Latest Ref Rng & Units 04/25/2021 03/12/2021 03/11/2021  WBC 3.4 - 10.8 x10E3/uL 10.9(H) 10.6(H) 12.5(H)  Hemoglobin 13.0 - 17.7 g/dL 15.3 15.8 16.1  Hematocrit 37.5 - 51.0 % 45.1 47.4 49.2  Platelets 150 - 450 x10E3/uL 220 231 216    CMP Latest Ref Rng & Units 10/14/2021 04/25/2021 03/12/2021  Glucose 70 - 99 mg/dL 142(H) 106(H) 123(H)  BUN 8 - 23 mg/dL _0 Creatinine 0.61 - 1.24 mg/dL 1.03 0.95 1.08  Sodium 135 - 145 mmol/L 135 141 135  Potassium 3.5 - 5.1 mmol/L 4.2 4.5 4.3  Chloride 98 - 111 mmol/L 106 104 102  CO2 22 - 32 mmol/L _1 Calcium 8.9 - 10.3 mg/dL 8.9 9.3 9.1  Total Protein 6.0 - 8.5 g/dL - 7.2 8.1  Total Bilirubin 0.0 - 1.2 mg/dL - 0.7 1.3(H)  Alkaline Phos 44 - 121 IU/L - 97 76  AST 0 - 40 IU/L -  24 22  ALT 0 - 44 IU/L - 21 20    Hepatic Function Latest Ref Rng & Units 04/25/2021 03/12/2021 03/11/2021  Total Protein 6.0 - 8.5 g/dL 7.2 8.1 7.7  Albumin 3.6 - 4.6 g/dL 4.4 3.9 3.9  AST 0 - 40 IU/L _0 ALT 0 - 44 IU/L _1 Alk Phosphatase 44 - 121 IU/L 97 76 79  Total Bilirubin 0.0 - 1.2 mg/dL 0.7 1.3(H) 1.1    Above lab data reviewed.  Assessment:  #1.  H. pylori gastritis.  He finished 10 days of Pylera along with double dose PPI.  Would wait another 4 weeks before doing stool test for H. pylori antigen to document  eradication.  #2.  Postprandial bloating and nausea.  He had a CT back in March 2022 for abdominal pain and was negative for cholelithiasis.  He could have cholesterol stones in spite of a negative CT.  #3.  Chronic constipation.  He is doing well with MiraLAX.  #4.  Weight loss.  His baseline weight has been around 210 pounds.  He weighed 202 pounds in March 2022.  Weight loss seem to have slowed.  Weight loss most likely due to diminished calorie intake.  Plan:  Proceed with abdominal ultrasound. Stool H. pylori antigen in 4 weeks. Phazyme 180 mg by mouth after lunch and supper daily for 1 to 2 weeks and thereafter on as-needed basis. Office visit in 3 months.

## 2021-11-26 NOTE — Patient Instructions (Signed)
Physician will call with results of tests when completed. 

## 2021-11-27 DIAGNOSIS — Z23 Encounter for immunization: Secondary | ICD-10-CM | POA: Diagnosis not present

## 2021-11-28 ENCOUNTER — Telehealth (INDEPENDENT_AMBULATORY_CARE_PROVIDER_SITE_OTHER): Payer: Self-pay | Admitting: *Deleted

## 2021-11-28 NOTE — Telephone Encounter (Signed)
Pt's daughter Lattie Haw calling to check on ultrasound. States she cannot do any Tuesday or Thursday dec8th. Any other day is good but would like mid morning.   Please call daughter Lattie Haw [phone] when appt is set.

## 2021-11-28 NOTE — Progress Notes (Signed)
Cardiology Office Note:   Date:  11/29/2021  NAME:  Jimmy Knox    MRN: [id number] DOB:  1932-03-17   PCP:  Asencion Noble, MD  Cardiologist:  None  Electrophysiologist:  None   Referring MD: Asencion Noble, MD   Chief Complaint  Patient presents with   Coronary Artery Disease    History of Present Illness:   Jimmy Knox is a 87 y.o. male with a hx of CAD, systolic heart failure, interstitial lung disease, spinal stenosis who is being seen today for the evaluation of CAD at the request of Asencion Noble, MD. he presents with his daughter.  Seems to be doing well.  Denies any chest pain or trouble breathing.  He does still live at home alone.  No falls recently.  He is not diabetic.  LDL cholesterol 77 last year.  Apparently he is no longer on a statin.  We discussed going back on this.  He has had no chest pain episodes.  He is not diabetic.  A1c 6.3.  He has no lower extremity edema.  He describes no symptoms concerning for congestive heart failure.  Overall seems to be doing quite well.  Blood pressure 114/70 on no medication.  Problem List CAD -CTO RCA -> PCI 07/05/2018 -CTO LCX (unable to open) -50% mid LAD 2. Systolic HF -EF 30-86% 3. HLD -T chol 136, HDL 38, LDL 77, TG 113 4. HTN 5. Spinal stenosis  -severe L2-3, L3-4, L4-5 6. Interstitial lung dz 7. Carotid artery disease -bilateral <50% 8. Microvascular cerebral ischemic changes  Past Medical History: Past Medical History:  Diagnosis Date   Anxiety    Arthritis    "right hip" (07/15/2018)   Carotid artery disease ([place])    a. <50% bilaterally 05/2016.   Chronic combined systolic and diastolic CHF (congestive heart failure) ([place])    Complication of anesthesia    "didn't wake up well after OR on 07/30/2016; took 2-3 to hold me down"   Coronary artery disease    a. prior MI/stent around 2000. b. Nuc 09/2015 -> abnl, mgmd medically.   Facial neuralgia    History of blood transfusion 07/30/2016   "after the operation"    Hyperlipidemia    Hypertension    Ischemic cardiomyopathy    Melanoma of nose ([place])    "inside on the right side"   Myocardial infarction ([place]) ~ 2000   Pneumonia 1935- 1970s ,"several times"   [facility] spotted fever 1942   Skin cancer    "on my back; arms; froze them off"   Type 2 diabetes, diet controlled ([place])     Past Surgical History: Past Surgical History:  Procedure Laterality Date   APPENDECTOMY     BACK SURGERY     BIOPSY  10/16/2021   Procedure: BIOPSY;  Surgeon: Rogene Houston, MD;  [facility];  Service: Endoscopy;;   CATARACT EXTRACTION, BILATERAL Bilateral    COLONOSCOPY N/A 09/28/2015   Procedure: COLONOSCOPY;  Surgeon: Rogene Houston, MD;  [facility];  Service: Endoscopy;  Laterality: N/A;  955   CORONARY ANGIOPLASTY WITH STENT PLACEMENT  2000   CORONARY CTO INTERVENTION  07/15/2018   CORONARY CTO INTERVENTION N/A 07/15/2018   Procedure: CORONARY CTO INTERVENTION;  Surgeon: Martinique, Peter M, MD;  Location: [facility];  Service: Cardiovascular;  Laterality: N/A;   CYSTOSCOPY W/ URETERAL STENT PLACEMENT     ESOPHAGOGASTRODUODENOSCOPY (EGD) WITH PROPOFOL N/A 10/16/2021   Procedure: ESOPHAGOGASTRODUODENOSCOPY (EGD) WITH  PROPOFOL;  Surgeon: Rogene Houston, MD;  [facility];  Service: Endoscopy;  Laterality: N/A;  11:05   EYE SURGERY Right 2013   "got hit by a limb & knocked my eye out"   JOINT REPLACEMENT     LEFT HEART CATH AND CORONARY ANGIOGRAPHY N/A 06/15/2018   Procedure: LEFT HEART CATH AND CORONARY ANGIOGRAPHY;  Surgeon: Martinique, Peter M, MD;  Location: [facility];  Service: Cardiovascular;  Laterality: N/A;   LUMBAR [place] Right    "inside my nose"   TONSILLECTOMY     TOTAL HIP ARTHROPLASTY Right 07/29/2016   Procedure: TOTAL HIP ARTHROPLASTY;  Surgeon: Garald Balding, MD;  Location: [facility];  Service: Orthopedics;  Laterality: Right;    Current  Medications: Current Meds  Medication Sig   aspirin EC 81 MG tablet Take 1 tablet (81 mg total) by mouth in the morning.   gabapentin (NEURONTIN) 300 MG capsule Take 300 mg by mouth 2 (two) times daily.   nitroGLYCERIN (NITROSTAT) 0.4 MG SL tablet Place 1 tablet (0.4 mg total) under the tongue every 5 (five) minutes as needed for chest pain.   polyethylene glycol powder (GLYCOLAX/MIRALAX) 17 GM/SCOOP powder Take 8.5 g by mouth daily. (Patient taking differently: Take 8.5 g by mouth daily as needed (constipation.).)   Simethicone (PHAZYME) 180 MG CAPS Take 1 capsule (180 mg total) by mouth 2 (two) times daily as needed.     Allergies:    Patient has no known allergies.   Social History: Social History   Socioeconomic History   Marital status: Widowed    Spouse name: Not on file   Number of children: Not on file   Years of education: Not on file   Highest education level: Not on file  Occupational History   Occupation: Retired  Tobacco Use   Smoking status: Former    Packs/day: 1.00    Years: 27.00    Pack years: 27.00    Types: Cigarettes    Start date: 12/30/1943    Quit date: 12/29/1970    Years since quitting: 50.9   Smokeless tobacco: Never   Tobacco comments:    "stopped smoking in 1970s when I got pneumonia"  Vaping Use   Vaping Use: Never used  Substance and Sexual Activity   Alcohol use: Not Currently    Comment: beer every once in awhile recommended by doctor for kidneys    Drug use: Never   Sexual activity: Not Currently  Other Topics Concern   Not on file  Social History Narrative   Lives alone    Right handed   Caffeine: decaf coffee once a week or so    Social Determinants of Health   Financial Resource Strain: Not on file  Food Insecurity: Not on file  Transportation Needs: Not on file  Physical Activity: Not on file  Stress: Not on file  Social Connections: Not on file     Family History: The patient's family history includes Alzheimer's disease  in his mother; Diabetes in his brother and maternal uncle; Diabetes Mellitus II in his brother.  ROS:   All other ROS reviewed and negative. Pertinent positives noted in the HPI.     EKGs/Labs/Other Studies Reviewed:   The following studies were personally reviewed by me today:  TTE 03/13/2020  1. Left ventricular ejection fraction, by estimation, is 45 to 50%. The  left ventricle has mildly decreased function. The left ventricle  demonstrates global hypokinesis. There is mild left ventricular  hypertrophy of the septal segment. Grade  indeterminate diastolic dysfunction. Elevated left ventricular  end-diastolic pressure.   2. Right ventricular systolic function is normal. The right ventricular  size is normal.   3. The mitral valve is grossly normal. No evidence of mitral valve  regurgitation.   4. The aortic valve is tricuspid. Aortic valve regurgitation is mild. No  aortic stenosis is present.   LHC 06/15/2018 1. 2 vessel occlusive CAD    - patent stent in the mid LAD. 50% disease proximal to the stent    - 100% CTO of the proximal LCx with left to left collaterals.    - 99% mid RCA with left to right collaterals. 2. Mild LV dysfunction 3. Mildly elevated LVEDP  Recent Labs: 04/25/2021: ALT 21; Hemoglobin 15.3; Platelets 220 10/14/2021: BUN 18; Creatinine, Ser 1.03; Potassium 4.2; Sodium 135   Recent Lipid Panel No results found for: CHOL, TRIG, HDL, CHOLHDL, VLDL, LDLCALC, LDLDIRECT  Physical Exam:   VS:  BP 114/70   Pulse 84   Ht 6' (1.829 m)   Wt 192 lb (87.1 kg)   SpO2 97%   BMI 26.04 kg/m    Wt Readings from Last 3 Encounters:  11/29/21 192 lb (87.1 kg)  11/26/21 191 lb 6.4 oz (86.8 kg)  10/14/21 190 lb (86.2 kg)    General: Well nourished, well developed, in no acute distress Head: Atraumatic, normal size  Eyes: PEERLA, EOMI  Neck: Supple, no JVD Endocrine: No thryomegaly Cardiac: Normal S1, S2; RRR; no murmurs, rubs, or gallops Lungs: Clear to  auscultation bilaterally, no wheezing, rhonchi or rales  Abd: Soft, nontender, no hepatomegaly  Ext: No edema, pulses 2+ Musculoskeletal: No deformities, BUE and BLE strength normal and equal Skin: Warm and dry, no rashes   Neuro: Alert and oriented to person, place, time, and situation, CNII-XII grossly intact, no focal deficits  Psych: Normal mood and affect   ASSESSMENT:   Jimmy Knox is a 87 y.o. male who presents for the following: 1. Coronary artery disease involving native coronary artery of native heart without angina pectoris   2. Chronic systolic heart failure ([place])   3. Mixed hyperlipidemia     PLAN:   1. Coronary artery disease involving native coronary artery of native heart without angina pectoris 2. Chronic systolic heart failure ([place]) 3. Mixed hyperlipidemia -Prior history of CTO of the RCA that underwent PCI in 2019.  He has a CTO of the circumflex that was unable to be intervened upon due to technical difficulties. -He had a 50% mid LAD lesion as well. -He seems to have done well since his intervention in 2019.  He denies symptoms of chest pain or trouble breathing. -EF 45-50%  -Given his lack of symptoms he has been continued on medical therapy.  His blood pressure is quite low certainly no need to escalate any therapy given lack of symptoms.  Would recommend to continue aspirin 81 mg daily.  His most recent LDL is not at goal despite his history of CAD.  He apparently is no longer taking a statin.  We discussed going on low-dose Lipitor which she is amendable to do.  We will start him on Lipitor 10 mg daily. -He has no symptoms of congestive heart failure.  Given his age and marginal blood pressure I see no need to treat this aggressively. -He has no need for Lasix.  He has no edema.  He has no  clinical symptoms of heart failure. -Overall doing well.  He will see [place] yearly.  Mainstay of therapy is aspirin and statin.  Disposition: Return in about 1 year (around  11/29/2022).  Medication Adjustments/Labs and Tests Ordered: Current medicines are reviewed at length with the patient today.  Concerns regarding medicines are outlined above.  No orders of the defined types were placed in this encounter.  No orders of the defined types were placed in this encounter.   There are no Patient Instructions on file for this visit.   Time Spent with Patient: I have spent a total of 35 minutes with patient reviewing hospital notes, telemetry, EKGs, labs and examining the patient as well as establishing an assessment and plan that was discussed with the patient.  > 50% of time was spent in direct patient care.  Signed, Addison Naegeli. Audie Box, MD, [place]  [address], [place], [place] [address] [phone]  11/29/2021 10:39 AM

## 2021-11-29 ENCOUNTER — Ambulatory Visit (INDEPENDENT_AMBULATORY_CARE_PROVIDER_SITE_OTHER): Payer: Medicare Other | Admitting: Cardiovascular Disease

## 2021-11-29 ENCOUNTER — Encounter: Payer: Self-pay | Admitting: Cardiovascular Disease

## 2021-11-29 VITALS — BP 114/70 | HR 84 | Ht 72.0 in | Wt 192.0 lb

## 2021-11-29 DIAGNOSIS — E782 Mixed hyperlipidemia: Secondary | ICD-10-CM | POA: Diagnosis not present

## 2021-11-29 DIAGNOSIS — I2510 Atherosclerotic heart disease of native coronary artery without angina pectoris: Secondary | ICD-10-CM

## 2021-11-29 DIAGNOSIS — I5022 Chronic systolic (congestive) heart failure: Secondary | ICD-10-CM | POA: Diagnosis not present

## 2021-11-29 MED ORDER — ATORVASTATIN CALCIUM 10 MG PO TABS: 10.0000 mg | ORAL_TABLET | Freq: Every day | ORAL | 3 refills | Status: DC

## 2021-11-29 NOTE — Patient Instructions (Signed)
Medication Instructions:   Start Lipitor 10 mg Daily   *If you need a refill on your cardiac medications before your next appointment, please call your pharmacy*   Lab Work: NONE   If you have labs (blood work) drawn today and your tests are completely normal, you will receive your results only by: [place] (if you have MyChart) OR A paper copy in the mail If you have any lab test that is abnormal or we need to change your treatment, we will call you to review the results.   Testing/Procedures: NONE    Follow-Up: At [facility], you and your health needs are our priority.  As part of our continuing mission to provide you with exceptional heart care, we have created designated Provider Care Teams.  These Care Teams include your primary Cardiologist (physician) and Advanced Practice Providers (APPs -  Physician Assistants and Nurse Practitioners) who all work together to provide you with the care you need, when you need it.  We recommend signing up for the patient portal called "MyChart".  Sign up information is provided on this After Visit Summary.  MyChart is used to connect with patients for Virtual Visits (Telemedicine).  Patients are able to view lab/test results, encounter notes, upcoming appointments, etc.  Non-urgent messages can be sent to your provider as well.   To learn more about what you can do with MyChart, go to [email or web address].    Your next appointment:   1 year(s)  The format for your next appointment:   In Person  Provider:   Dr. Audie Box      Other Instructions Thank you for choosing [facility]!

## 2021-12-05 ENCOUNTER — Ambulatory Visit (HOSPITAL_COMMUNITY)
Admission: RE | Admit: 2021-12-05 | Discharge: 2021-12-05 | Disposition: A | Discharge status: Home / Self Care | Payer: Medicare Other | Source: Ambulatory Visit | Attending: Internal Medicine | Admitting: Internal Medicine

## 2021-12-05 ENCOUNTER — Other Ambulatory Visit: Payer: Self-pay

## 2021-12-05 DIAGNOSIS — R140 Abdominal distension (gaseous): Secondary | ICD-10-CM | POA: Insufficient documentation

## 2021-12-05 DIAGNOSIS — N200 Calculus of kidney: Secondary | ICD-10-CM | POA: Diagnosis not present

## 2021-12-25 ENCOUNTER — Telehealth (INDEPENDENT_AMBULATORY_CARE_PROVIDER_SITE_OTHER): Payer: Self-pay | Admitting: *Deleted

## 2021-12-25 DIAGNOSIS — Z79899 Other long term (current) drug therapy: Secondary | ICD-10-CM | POA: Diagnosis not present

## 2021-12-25 DIAGNOSIS — I2510 Atherosclerotic heart disease of native coronary artery without angina pectoris: Secondary | ICD-10-CM | POA: Diagnosis not present

## 2021-12-25 DIAGNOSIS — F329 Major depressive disorder, single episode, unspecified: Secondary | ICD-10-CM | POA: Diagnosis not present

## 2021-12-25 DIAGNOSIS — E1159 Type 2 diabetes mellitus with other circulatory complications: Secondary | ICD-10-CM | POA: Diagnosis not present

## 2021-12-25 DIAGNOSIS — I10 Essential (primary) hypertension: Secondary | ICD-10-CM | POA: Diagnosis not present

## 2021-12-25 DIAGNOSIS — E785 Hyperlipidemia, unspecified: Secondary | ICD-10-CM | POA: Diagnosis not present

## 2021-12-25 NOTE — Telephone Encounter (Signed)
Patient came in for a weight check with daughter Jimmy Knox today.   Last seen by Dr Laural Golden on 11/26/21. Weight that day was 191. Note from that visit states baseline weight is 210 lbs and weight in march 2022 was 202. Daughter states he was sick with a cold last week and did not eat much at all that week. Eating better now. Eats 2 meals a day. No snacks. Does not do any meal replacement shakes. Can call daughter Jimmy Knox at [phone].   Baseline - 210 March 2022 - 202 11/26/21 - 191 12/25/21 - 188.4

## 2021-12-27 DIAGNOSIS — B9681 Helicobacter pylori [H. pylori] as the cause of diseases classified elsewhere: Secondary | ICD-10-CM | POA: Diagnosis not present

## 2021-12-27 DIAGNOSIS — K2970 Gastritis, unspecified, without bleeding: Secondary | ICD-10-CM | POA: Diagnosis not present

## 2021-12-27 NOTE — Telephone Encounter (Signed)
Per dr Laural Golden recheck weight in 2 months and check on status of hypylori stool antigen test. I called and spoke with daughter Lattie Haw and let her know to do weight check in 75months and added patient to reminder file for wt check in 2 months. Daughter states they will do the stool test next week and take back to lab.

## 2021-12-29 LAB — H. PYLORI ANTIGEN, STOOL: H pylori Ag, Stl: NEGATIVE

## 2022-01-06 DIAGNOSIS — I5022 Chronic systolic (congestive) heart failure: Secondary | ICD-10-CM | POA: Diagnosis not present

## 2022-01-06 DIAGNOSIS — Z6826 Body mass index (BMI) 26.0-26.9, adult: Secondary | ICD-10-CM | POA: Diagnosis not present

## 2022-01-06 DIAGNOSIS — E785 Hyperlipidemia, unspecified: Secondary | ICD-10-CM | POA: Diagnosis not present

## 2022-01-06 DIAGNOSIS — I2510 Atherosclerotic heart disease of native coronary artery without angina pectoris: Secondary | ICD-10-CM | POA: Diagnosis not present

## 2022-01-06 DIAGNOSIS — E1122 Type 2 diabetes mellitus with diabetic chronic kidney disease: Secondary | ICD-10-CM | POA: Diagnosis not present

## 2022-01-06 DIAGNOSIS — I10 Essential (primary) hypertension: Secondary | ICD-10-CM | POA: Diagnosis not present

## 2022-01-06 DIAGNOSIS — I700 Atherosclerosis of aorta: Secondary | ICD-10-CM | POA: Diagnosis not present

## 2022-01-06 DIAGNOSIS — G629 Polyneuropathy, unspecified: Secondary | ICD-10-CM | POA: Diagnosis not present

## 2022-01-30 DIAGNOSIS — H16211 Exposure keratoconjunctivitis, right eye: Secondary | ICD-10-CM | POA: Diagnosis not present

## 2022-03-07 ENCOUNTER — Telehealth (INDEPENDENT_AMBULATORY_CARE_PROVIDER_SITE_OTHER): Payer: Self-pay

## 2022-03-07 NOTE — Telephone Encounter (Signed)
03/07/2022: Spoke with the patient daughter she states they forgot about the weight check at the end of Feb. They will weight at home and call this in otherwise they want to wait until the patient appointment here on 03/25/2022 to have his weight checked here. CLS ?

## 2022-03-10 NOTE — Telephone Encounter (Signed)
I called patients daughter Jimmy Knox to get his weight and she said yesterday he weighed 200 lbs. Weight at last OV on 11/29 was 191 lbs. She states he is eating well and has upcoming appt on 3/28.  ?Copied last OV note below about weight.  ?#4.  Weight loss.  His baseline weight has been around 210 pounds.  He weighed 202 pounds in March 2022.  Weight loss seem to have slowed.  Weight loss most likely due to diminished calorie intake. ?

## 2022-03-13 NOTE — Telephone Encounter (Signed)
Per dr Laural Golden - gaining weight and that is good and will discuss further at ov on 3/28 ?

## 2022-03-25 ENCOUNTER — Other Ambulatory Visit: Payer: Self-pay

## 2022-03-25 ENCOUNTER — Ambulatory Visit (INDEPENDENT_AMBULATORY_CARE_PROVIDER_SITE_OTHER): Payer: Medicare Other | Admitting: Internal Medicine

## 2022-03-25 ENCOUNTER — Encounter (INDEPENDENT_AMBULATORY_CARE_PROVIDER_SITE_OTHER): Payer: Self-pay | Admitting: Internal Medicine

## 2022-03-25 VITALS — BP 107/70 | HR 77 | Temp 98.1°F | Ht 72.0 in | Wt 197.7 lb

## 2022-03-25 DIAGNOSIS — K5900 Constipation, unspecified: Secondary | ICD-10-CM | POA: Diagnosis not present

## 2022-03-25 DIAGNOSIS — R634 Abnormal weight loss: Secondary | ICD-10-CM

## 2022-03-25 NOTE — Progress Notes (Signed)
Presenting complaint; ? ?Follow-up for constipation and weight loss. ? ?Database and subjective: ? ?Patient is 88 year old Caucasian male who is here for scheduled visit accompanied by his daughter Lattie Haw. ?He was last seen on 11/26/2021 when he weighed 191 pounds. ?He has history of abdominal pain constipation and weight loss.  He underwent EGD back in October 2022 when he was found to have H. pylori gastritis.  He was treated with Pylera.  Stool H. pylori antigen was checked 3 months ago and was negative implying eradication of H. pylori. ?His weight was 188 pounds on 12/25/2021. ? ?Patient has no GI complaints today.  His bowels move daily as long as he takes polyethylene glycol.  He is not having abdominal pain anymore.  He denies nausea vomiting melena or rectal bleeding.  His daughter Lattie Haw states that he had physical exam about 8 weeks ago and his lab studies unremarkable.  His appetite is good.  He eats 2-3 meals a day.  On days when he has 2 meals usually has a snack as a third meal.  He has gained 6 pounds since his last visit of November 2022. ? ?Today he complains of lightheadedness when he stands up.  He also complains of intermittent lower extremity edema.  It comes and goes.  He has been on low-salt diet.  He denies chest pain but does complain of exertional dyspnea.  He denies orthopnea. ? ?Current Medications: ?Outpatient Encounter Medications as of 03/25/2022  ?Medication Sig  ? aspirin EC 81 MG tablet Take 1 tablet (81 mg total) by mouth in the morning.  ? atorvastatin (LIPITOR) 10 MG tablet Take 1 tablet (10 mg total) by mouth daily.  ? gabapentin (NEURONTIN) 300 MG capsule Take 300 mg by mouth 2 (two) times daily.  ? nitroGLYCERIN (NITROSTAT) 0.4 MG SL tablet Place 1 tablet (0.4 mg total) under the tongue every 5 (five) minutes as needed for chest pain.  ? polyethylene glycol powder (GLYCOLAX/MIRALAX) 17 GM/SCOOP powder Take 8.5 g by mouth daily. (Patient taking differently: Take 8.5 g by mouth  daily as needed (constipation.).)  ? Simethicone (PHAZYME) 180 MG CAPS Take 1 capsule (180 mg total) by mouth 2 (two) times daily as needed.  ? ?No facility-administered encounter medications on file as of 03/25/2022.  ? ? ? ?Objective: ?Blood pressure (!) 98/54, pulse 88, temperature 98.1 ?F (36.7 ?C), temperature source Oral, height 6' (1.829 m), weight 197 lb 11.2 oz (89.7 kg).  Repeat blood pressure while supine was 107/70 and pulse was 76. ?Patient is alert and in no acute distress. ?He has hearing impairment. ?Conjunctiva is pink. Sclera is nonicteric ?Oropharyngeal mucosa is normal. ?No neck masses or thyromegaly noted. ?Cardiac exam with irregular rhythm normal S1 and S2. No murmur or gallop noted. ?Lungs are clear to auscultation. ?Abdomen is soft and nontender without organomegaly or masses. ?He has trace edema around his ankles. ? ?Labs/studies Results: ? ?H. pylori stool antigen negative on 12/27/2021. ? ? ?Assessment: ? ?#1.  #1 constipation.  He is doing well with dietary measures and polyethylene glycol.  He will continue to use it as long as it is working. ? ?#2.  Weight loss.  His baseline weight was around 210 pounds.  He dropped to 188 pounds and now he weighs 197 pounds.  He has trace edema around the ankles which I do not believe this contributing to his weight gain. ? ?#3.  Lightheadedness.  His blood pressure is low normal.  Rhythm appears to be irregular.  He  is not tachycardic.  Need to make sure he is not develop atrial fibrillation. ? ?#4.  History of epigastric pain.  Pain has resolved with eradication of H. pylori infection. ? ? ?Plan: ? ?He will follow-up with Dr. Willey Blade at 11 AM tomorrow. ?Patient advised to go to ER if he develops shortness of breath or lightheadedness worsens. ?Patient advised not to jump out of bed and turn slowly. ?Weight check in 2 months ?Office visit in 6 months ? ? ? ?  ?

## 2022-03-25 NOTE — Patient Instructions (Signed)
Followed by Dr. Willey Blade at 11 AM tomorrow ?Notified MiraLAX/polyethylene glycol stops working. ?We will recheck in 2 months ?

## 2022-03-26 DIAGNOSIS — I951 Orthostatic hypotension: Secondary | ICD-10-CM | POA: Diagnosis not present

## 2022-03-26 DIAGNOSIS — R002 Palpitations: Secondary | ICD-10-CM | POA: Diagnosis not present

## 2022-04-17 DIAGNOSIS — H524 Presbyopia: Secondary | ICD-10-CM | POA: Diagnosis not present

## 2022-04-17 DIAGNOSIS — E119 Type 2 diabetes mellitus without complications: Secondary | ICD-10-CM | POA: Diagnosis not present

## 2022-04-27 DIAGNOSIS — I10 Essential (primary) hypertension: Secondary | ICD-10-CM | POA: Diagnosis not present

## 2022-04-27 DIAGNOSIS — E119 Type 2 diabetes mellitus without complications: Secondary | ICD-10-CM | POA: Diagnosis not present

## 2022-04-27 DIAGNOSIS — E785 Hyperlipidemia, unspecified: Secondary | ICD-10-CM | POA: Diagnosis not present

## 2022-05-01 DIAGNOSIS — E1159 Type 2 diabetes mellitus with other circulatory complications: Secondary | ICD-10-CM | POA: Diagnosis not present

## 2022-05-01 DIAGNOSIS — Z79899 Other long term (current) drug therapy: Secondary | ICD-10-CM | POA: Diagnosis not present

## 2022-05-08 DIAGNOSIS — I5022 Chronic systolic (congestive) heart failure: Secondary | ICD-10-CM | POA: Diagnosis not present

## 2022-05-08 DIAGNOSIS — E1169 Type 2 diabetes mellitus with other specified complication: Secondary | ICD-10-CM | POA: Diagnosis not present

## 2022-05-08 DIAGNOSIS — I951 Orthostatic hypotension: Secondary | ICD-10-CM | POA: Diagnosis not present

## 2022-05-08 DIAGNOSIS — R7309 Other abnormal glucose: Secondary | ICD-10-CM | POA: Diagnosis not present

## 2022-05-15 DIAGNOSIS — L03032 Cellulitis of left toe: Secondary | ICD-10-CM | POA: Diagnosis not present

## 2022-05-29 DIAGNOSIS — E1142 Type 2 diabetes mellitus with diabetic polyneuropathy: Secondary | ICD-10-CM | POA: Diagnosis not present

## 2022-05-29 DIAGNOSIS — L03032 Cellulitis of left toe: Secondary | ICD-10-CM | POA: Diagnosis not present

## 2022-05-29 DIAGNOSIS — L601 Onycholysis: Secondary | ICD-10-CM | POA: Diagnosis not present

## 2022-06-12 DIAGNOSIS — L601 Onycholysis: Secondary | ICD-10-CM | POA: Diagnosis not present

## 2022-06-12 DIAGNOSIS — E1142 Type 2 diabetes mellitus with diabetic polyneuropathy: Secondary | ICD-10-CM | POA: Diagnosis not present

## 2022-06-16 ENCOUNTER — Telehealth (INDEPENDENT_AMBULATORY_CARE_PROVIDER_SITE_OTHER): Payer: Self-pay | Admitting: Gastroenterology

## 2022-06-16 NOTE — Telephone Encounter (Signed)
Does not seem to be related to H. Pylori. So is he having diarrhea now? Is he still taking the Miralax while having diarrhea or this is one of the episodes of explosive diarrhea after being constipated and taking Miralax? If having diarrhea would recommend to hold off the Miralax for now. Can he see [place] in the clinic for these new symptoms?

## 2022-06-16 NOTE — Telephone Encounter (Signed)
Pt's daughter, Lattie Haw, called to say she needed to speak to a nurse regarding patient. Hx of H Pylori and having spells of diarrhea, no appetite. Please call [phone]

## 2022-06-16 NOTE — Telephone Encounter (Signed)
Patient daughter Jimmy Knox called stating patient having issues with diarrhea on going for the last few days. He reports to have a " Rolling Stomach", and decreased appetite. He has had H pylori in the past and unsure if this is it again. Patient mainly reports diarrhea after eating, last night had three episodes of diarrhea. He also says he has seen dark stools, no bright red blood seen. They say he is normally constipated and will go days between bm's, he takes miralax prn and when he finally is able to have a bm is it explosive diarrhea. He has not taken any imodium,but has taken pepto. He denies any fever, nausea, vomiting or any other gi issues. If anything sent in would like it to go to [facility] in [place].

## 2022-06-16 NOTE — Telephone Encounter (Signed)
I called and left a message asked that the patient please return call if they still need assistance.

## 2022-06-16 NOTE — Telephone Encounter (Signed)
Thanks

## 2022-06-16 NOTE — Telephone Encounter (Signed)
Patient daughter aware of all.   She says she is really not sure if this is one of the episode after taking MiraLax, but she did not think it was. Patient has not had any diarrhea today even after eating two hotdogs. Daughter aware to have patient here at the office on Monday 06/23/2022 at 3 pm. Jimmy Knox has made the appointment.

## 2022-06-23 ENCOUNTER — Ambulatory Visit (INDEPENDENT_AMBULATORY_CARE_PROVIDER_SITE_OTHER): Payer: Medicare Other | Admitting: Gastroenterology

## 2022-06-23 ENCOUNTER — Encounter (INDEPENDENT_AMBULATORY_CARE_PROVIDER_SITE_OTHER): Payer: Self-pay | Admitting: Gastroenterology

## 2022-06-23 VITALS — BP 118/83 | HR 125 | Temp 97.8°F | Ht 72.0 in | Wt 194.8 lb

## 2022-06-23 DIAGNOSIS — K5904 Chronic idiopathic constipation: Secondary | ICD-10-CM

## 2022-06-23 DIAGNOSIS — R140 Abdominal distension (gaseous): Secondary | ICD-10-CM

## 2022-06-23 NOTE — Progress Notes (Signed)
[name], M.D. Gastroenterology & Hepatology [facility] [address] [place], [place] [address]  Primary Care Physician: Carylon Perches, MD [address] [place] [address]  I will communicate my assessment and recommendations to the referring MD via EMR.  Problems: Constipation  History of Present Illness: Jimmy Knox is a 88 y.o. male with past medical history of anxiety, arthritis, coronary artery disease, hypertension, hyperlipidemia, ischemic cardiomyopathy, diabetes, melanoma, history of constipation, who presents for follow up of constipation.  The patient was last seen on 03/17/2022. At that time, the patient was advised to continue MiraLAX for constipation.  Patient reports that he has presented frequent bloating episodes despite taking his laxatives compliantly. He has been taking half a capful of Miralax and two Dulcolax every day, has never increased his Miralax. He is having a Bm every 4 days. However, he reports that when he has to have a BM he has urgency and a massive episode of diarrhea, but no more bowel movements.  He has seen some black stools occasionally which is solid in consistency.   Has lost a few lb since last time in clinic.  The patient denies having any nausea, vomiting, fever, chills, hematochezia, melena, hematemesis, abdominal pain, diarrhea, jaundice, pruritus or weight loss.  Last EGD: 10/16/2021 - Normal esophagus. - Z-line irregular, 40 cm from the incisors. - Gastritis. Biopsied -positive for H. pylori gastritis without dysplasia or metaplasia. - Normal cardia, gastric fundus and pylorus. - Normal duodenal bulb and second portion of the duodenum.  Last colonoscopy 2016 - Examination performed to cecum.  Small polyp cold snare from ascending colon. This polyp was lost.  Small polyp removed with cold biopsy forceps from distal transverse colon.  mild sigmoid colon  diverticulosis.  Small external hemorrhoids.  Past Medical History: Past Medical History:  Diagnosis Date   Anxiety    Arthritis    "right hip" (07/15/2018)   Carotid artery disease (HCC)    a. <50% bilaterally 05/2016.   Chronic combined systolic and diastolic CHF (congestive heart failure) (HCC)    Complication of anesthesia    "didn't wake up well after OR on 07/30/2016; took 2-3 to hold me down"   Coronary artery disease    a. prior MI/stent around 2000. b. Nuc 09/2015 -> abnl, mgmd medically.   Facial neuralgia    History of blood transfusion 07/30/2016   "after the operation"   Hyperlipidemia    Hypertension    Ischemic cardiomyopathy    Melanoma of nose (HCC)    "inside on the right side"   Myocardial infarction (HCC) ~ 2000   Pneumonia 1935- 1970s ,"several times"   [facility] spotted fever 1942   Skin cancer    "on my back; arms; froze them off"   Type 2 diabetes, diet controlled (HCC)     Past Surgical History: Past Surgical History:  Procedure Laterality Date   APPENDECTOMY     BACK SURGERY     BIOPSY  10/16/2021   Procedure: BIOPSY;  Surgeon: Malissa Hippo, MD;  Location: [facility] ENDO [facility]ITE;  Service: Endoscopy;;   CATARACT EXTRACTION, BILATERAL Bilateral    COLONOSCOPY N/A 09/28/2015   Procedure: COLONOSCOPY;  Surgeon: Malissa Hippo, MD;  Location: [facility];  Service: Endoscopy;  Laterality: N/A;  955   CORONARY ANGIOPLASTY WITH STENT PLACEMENT  2000   CORONARY CTO INTERVENTION  07/15/2018   CORONARY CTO INTERVENTION N/A 07/15/2018   Procedure: CORONARY CTO INTERVENTION;  Surgeon: Swaziland, Peter M, MD;  Location: [facility] INVASIVE CV LAB;  Service: Cardiovascular;  Laterality: N/A;   CYSTOSCOPY W/ URETERAL STENT PLACEMENT     ESOPHAGOGASTRODUODENOSCOPY (EGD) WITH PROPOFOL N/A 10/16/2021   Procedure: ESOPHAGOGASTRODUODENOSCOPY (EGD) WITH PROPOFOL;  Surgeon: Malissa Hippo, MD;  Location: [facility];  Service: Endoscopy;  Laterality: N/A;  11:05   EYE  SURGERY Right 2013   "got hit by a limb & knocked my eye out"   JOINT REPLACEMENT     LEFT HEART CATH AND CORONARY ANGIOGRAPHY N/A 06/15/2018   Procedure: LEFT HEART CATH AND CORONARY ANGIOGRAPHY;  Surgeon: Swaziland, Peter M, MD;  Location: [facility] INVASIVE CV LAB;  Service: Cardiovascular;  Laterality: N/A;   LUMBAR DISC SURGERY  1992   MELANOMA EXCISION Right    "inside my nose"   TONSILLECTOMY     TOTAL HIP ARTHROPLASTY Right 07/29/2016   Procedure: TOTAL HIP ARTHROPLASTY;  Surgeon: Valeria Batman, MD;  Location: [facility] OR;  Service: Orthopedics;  Laterality: Right;    Family History: Family History  Problem Relation Age of Onset   Diabetes Mellitus II Brother        x2 brothers   Alzheimer's disease Mother    Diabetes Brother    Diabetes Maternal Uncle     Social History: Social History   Tobacco Use  Smoking Status Former   Packs/day: 1.00   Years: 27.00   Total pack years: 27.00   Types: Cigarettes   Start date: 12/30/1943   Quit date: 12/29/1970   Years since quitting: 51.5   Passive exposure: Past  Smokeless Tobacco Never  Tobacco Comments   "stopped smoking in 1970s when I got pneumonia"   Social History   Substance and Sexual Activity  Alcohol Use Not Currently   Comment: beer every once in awhile recommended by doctor for kidneys    Social History   Substance and Sexual Activity  Drug Use Never    Allergies: No Known Allergies  Medications: Current Outpatient Medications  Medication Sig Dispense Refill   aspirin EC 81 MG tablet Take 1 tablet (81 mg total) by mouth in the morning. 30 tablet 11   atorvastatin (LIPITOR) 10 MG tablet Take 1 tablet (10 mg total) by mouth daily. 90 tablet 3   furosemide (LASIX) 20 MG tablet Take 20 mg by mouth as needed for edema.     gabapentin (NEURONTIN) 300 MG capsule Take 300 mg by mouth 2 (two) times daily.     midodrine (PROAMATINE) 5 MG tablet Take 5 mg by mouth 2 (two) times daily with a meal.     nitroGLYCERIN  (NITROSTAT) 0.4 MG SL tablet Place 1 tablet (0.4 mg total) under the tongue every 5 (five) minutes as needed for chest pain. 25 tablet 2   polyethylene glycol powder (GLYCOLAX/MIRALAX) 17 GM/SCOOP powder Take 8.5 g by mouth daily. 255 g 0   No current facility-administered medications for this visit.    Review of Systems: GENERAL: negative for malaise, night sweats HEENT: No changes in hearing or vision, no nose bleeds or other nasal problems. NECK: Negative for lumps, goiter, pain and significant neck swelling RESPIRATORY: Negative for cough, wheezing CARDIOVASCULAR: Negative for chest pain, leg swelling, palpitations, orthopnea GI: SEE HPI MUSCULOSKELETAL: Negative for joint pain or swelling, back pain, and muscle pain. SKIN: Negative for lesions, rash PSYCH: Negative for sleep disturbance, mood disorder and recent psychosocial stressors. HEMATOLOGY Negative for prolonged bleeding, bruising easily, and swollen nodes. ENDOCRINE: Negative for cold or  heat intolerance, polyuria, polydipsia and goiter. NEURO: negative for tremor, gait imbalance, syncope and seizures. The remainder of the review of systems is noncontributory.   Physical Exam: BP 118/83 (BP Location: Left Arm, Patient Position: Sitting, Cuff Size: Large)   Pulse (!) 125   Temp 97.8 F (36.6 C) (Oral)   Ht 6' (1.829 m)   Wt 194 lb 12.8 oz (88.4 kg)   BMI 26.42 kg/m  GENERAL: The patient is AO x3, in no acute distress. HEENT: Head is normocephalic and atraumatic. EOMI are intact. Mouth is well hydrated and without lesions. NECK: Supple. No masses LUNGS: Clear to auscultation. No presence of rhonchi/wheezing/rales. Adequate chest expansion HEART: RRR, normal s1 and s2. ABDOMEN: Soft, nontender, no guarding, no peritoneal signs, and nondistended. BS +. No masses. EXTREMITIES: Without any cyanosis, clubbing, rash, lesions or edema. NEUROLOGIC: AOx3, no focal motor deficit. SKIN: no jaundice, no  rashes  Imaging/Labs: as above  I personally reviewed and interpreted the available labs, imaging and endoscopic files.  Impression and Plan: Jimmy Knox is a 88 y.o. male with past medical history of anxiety, arthritis, coronary artery disease, hypertension, hyperlipidemia, ischemic cardiomyopathy, diabetes, melanoma, history of constipation, who presents for follow up of constipation.  The patient has presented recurrent episodes of constipation despite taking MiraLAX.  He is taking a very low-dose of MiraLAX on a daily basis.  We discussed the fact that he can go to a higher dose to achieve better bowel movement regimen which also will improve his bloating.  I advised him to stop the Dulcolax and update [place] about the change in his bowel movement frequency.  - Increase Miralax to 2 capfuls every day - Stop Dulcolax - Call in 1 week to update change in bowel movement frequency  All questions were answered.      Dolores Frame, MD Gastroenterology and Hepatology [facility]

## 2022-07-28 DIAGNOSIS — I951 Orthostatic hypotension: Secondary | ICD-10-CM | POA: Diagnosis not present

## 2022-07-28 DIAGNOSIS — I959 Hypotension, unspecified: Secondary | ICD-10-CM | POA: Diagnosis not present

## 2022-07-28 DIAGNOSIS — Z79899 Other long term (current) drug therapy: Secondary | ICD-10-CM | POA: Diagnosis not present

## 2022-07-28 DIAGNOSIS — I5022 Chronic systolic (congestive) heart failure: Secondary | ICD-10-CM | POA: Diagnosis not present

## 2022-08-07 DIAGNOSIS — E1142 Type 2 diabetes mellitus with diabetic polyneuropathy: Secondary | ICD-10-CM | POA: Diagnosis not present

## 2022-08-07 DIAGNOSIS — L603 Nail dystrophy: Secondary | ICD-10-CM | POA: Diagnosis not present

## 2022-08-13 DIAGNOSIS — X32XXXD Exposure to sunlight, subsequent encounter: Secondary | ICD-10-CM | POA: Diagnosis not present

## 2022-08-13 DIAGNOSIS — L570 Actinic keratosis: Secondary | ICD-10-CM | POA: Diagnosis not present

## 2022-08-13 DIAGNOSIS — Z85828 Personal history of other malignant neoplasm of skin: Secondary | ICD-10-CM | POA: Diagnosis not present

## 2022-08-13 DIAGNOSIS — D225 Melanocytic nevi of trunk: Secondary | ICD-10-CM | POA: Diagnosis not present

## 2022-08-13 DIAGNOSIS — Z08 Encounter for follow-up examination after completed treatment for malignant neoplasm: Secondary | ICD-10-CM | POA: Diagnosis not present

## 2022-09-18 ENCOUNTER — Ambulatory Visit (INDEPENDENT_AMBULATORY_CARE_PROVIDER_SITE_OTHER): Payer: Medicare Other | Admitting: Gastroenterology

## 2022-10-02 ENCOUNTER — Ambulatory Visit (INDEPENDENT_AMBULATORY_CARE_PROVIDER_SITE_OTHER): Payer: Medicare Other | Admitting: Gastroenterology

## 2022-10-09 DIAGNOSIS — Z23 Encounter for immunization: Secondary | ICD-10-CM | POA: Diagnosis not present

## 2022-10-13 ENCOUNTER — Encounter (INDEPENDENT_AMBULATORY_CARE_PROVIDER_SITE_OTHER): Payer: Self-pay

## 2022-10-13 ENCOUNTER — Other Ambulatory Visit (INDEPENDENT_AMBULATORY_CARE_PROVIDER_SITE_OTHER): Payer: Self-pay

## 2022-10-13 ENCOUNTER — Encounter (INDEPENDENT_AMBULATORY_CARE_PROVIDER_SITE_OTHER): Payer: Self-pay | Admitting: Gastroenterology

## 2022-10-13 ENCOUNTER — Ambulatory Visit (INDEPENDENT_AMBULATORY_CARE_PROVIDER_SITE_OTHER): Payer: Medicare Other | Admitting: Gastroenterology

## 2022-10-13 VITALS — BP 110/70 | HR 72 | Temp 97.7°F | Ht 72.0 in | Wt 191.1 lb

## 2022-10-13 DIAGNOSIS — K921 Melena: Secondary | ICD-10-CM | POA: Diagnosis not present

## 2022-10-13 DIAGNOSIS — R6881 Early satiety: Secondary | ICD-10-CM

## 2022-10-13 DIAGNOSIS — R634 Abnormal weight loss: Secondary | ICD-10-CM

## 2022-10-13 DIAGNOSIS — R140 Abdominal distension (gaseous): Secondary | ICD-10-CM | POA: Diagnosis not present

## 2022-10-13 MED ORDER — PANTOPRAZOLE SODIUM 40 MG PO TBEC: 40.0000 mg | DELAYED_RELEASE_TABLET | Freq: Every day | ORAL | 3 refills | Status: DC

## 2022-10-13 NOTE — Patient Instructions (Signed)
It was nice to meet you! I am going to start you on protonix '40mg'$  once daily and get you scheduled for EGD for further evaluation of your symptoms Please avoid NSAIDs (advil, aleve, naproxen, goody powder, ibuprofen) as these can be very hard on your GI tract, causing inflammation, ulcers and damage to the lining of your GI tract.   Follow up 3 months

## 2022-10-13 NOTE — Progress Notes (Signed)
Referring Provider: Asencion Noble, MD Primary Care Physician:  Asencion Noble, MD Primary GI Physician: Jenetta Downer   Chief Complaint  Patient presents with   Follow-up    Pt complains of a lot of bloating. Not painful but bloating decreases appetite   HPI:   Jimmy Knox is a 88 y.o. male with past medical history of anxiety, arthritis, coronary artery disease, hypertension, hyperlipidemia, ischemic cardiomyopathy, diabetes, melanoma, history of constipation  Patient presenting today for bloating.  Last seen in June, at that time having frequent bloating, taking miralax 1/2 capful daily and two dulcolax daily. Having a BM every 4 days, but with urgency and massive diarrhea when he does. Occasional black stools.   Recommended to increase miralax to 2 capfuls/day, stop dulcolax.   Present:   Patient reports that he is still feeling bloated, reports that he often has this feeling after eating, noting that he feels full no matter how much he eats. He is having a BM usually daily, notably smaller in volume since his food intake has declined. He is doing miralax 2 capfuls per day.  Appetite is not great, has lost about 6 pounds since March. Denies passing flatulence. Bloating does not improve with a BM. He is not taking anything for bloating. Denies any GERD symptoms, nausea or vomiting.  He denies rectal bleeding. Notes that stools continue to be black, maybe 3 times per week. Denies any NSAIDs other than baby aspirin.  Last EGD: 10/16/2021 - Normal esophagus. - Z-line irregular, 40 cm from the incisors. - Gastritis. Biopsied -positive for H. pylori gastritis without dysplasia or metaplasia. - Normal cardia, gastric fundus and pylorus. - Normal duodenal bulb and second portion of the duodenum.  Last colonoscopy 2016 - Examination performed to cecum. Small polyp cold snare from ascending colon. This polyp was lost. Small polyp removed with cold biopsy forceps from distal transverse  colon-tubular adenoma mild sigmoid colon diverticulosis. Small external hemorrhoids.   Past Medical History:  Diagnosis Date   Anxiety    Arthritis    "right hip" (07/15/2018)   Carotid artery disease ([place])    a. <50% bilaterally 05/2016.   Chronic combined systolic and diastolic CHF (congestive heart failure) ([place])    Complication of anesthesia    "didn't wake up well after OR on 07/30/2016; took 2-3 to hold me down"   Coronary artery disease    a. prior MI/stent around 2000. b. Nuc 09/2015 -> abnl, mgmd medically.   Facial neuralgia    History of blood transfusion 07/30/2016   "after the operation"   Hyperlipidemia    Hypertension    Ischemic cardiomyopathy    Melanoma of nose ([place])    "inside on the right side"   Myocardial infarction ([place]) ~ 2000   Pneumonia 1935- 1970s ,"several times"   [facility] spotted fever 1942   Skin cancer    "on my back; arms; froze them off"   Type 2 diabetes, diet controlled ([place])     Past Surgical History:  Procedure Laterality Date   APPENDECTOMY     BACK SURGERY     BIOPSY  10/16/2021   Procedure: BIOPSY;  Surgeon: Rogene Houston, MD;  [facility];  Service: Endoscopy;;   CATARACT EXTRACTION, BILATERAL Bilateral    COLONOSCOPY N/A 09/28/2015   Procedure: COLONOSCOPY;  Surgeon: Rogene Houston, MD;  [facility];  Service: Endoscopy;  Laterality: N/A;  [place]  CORONARY CTO INTERVENTION  07/15/2018   CORONARY CTO INTERVENTION N/A 07/15/2018   Procedure: CORONARY CTO INTERVENTION;  Surgeon: Martinique, Peter M, MD;  Location: [place] [facility];  Service: Cardiovascular;  Laterality: N/A;   CYSTOSCOPY W/ URETERAL STENT PLACEMENT     ESOPHAGOGASTRODUODENOSCOPY (EGD) WITH PROPOFOL N/A 10/16/2021   Procedure: ESOPHAGOGASTRODUODENOSCOPY (EGD) WITH PROPOFOL;  Surgeon: Rogene Houston, MD;  [facility];  Service: Endoscopy;  Laterality: N/A;  11:05   EYE SURGERY  Right 2013   "got hit by a limb & knocked my eye out"   JOINT REPLACEMENT     LEFT HEART CATH AND CORONARY ANGIOGRAPHY N/A 06/15/2018   Procedure: LEFT HEART CATH AND CORONARY ANGIOGRAPHY;  Surgeon: Martinique, Peter M, MD;  Location: [place] [facility];  Service: Cardiovascular;  Laterality: N/A;   LUMBAR [place] Right    "inside my nose"   TONSILLECTOMY     TOTAL HIP ARTHROPLASTY Right 07/29/2016   Procedure: TOTAL HIP ARTHROPLASTY;  Surgeon: Garald Balding, MD;  Location: [place];  Service: Orthopedics;  Laterality: Right;    Current Outpatient Medications  Medication Sig Dispense Refill   aspirin EC 81 MG tablet Take 1 tablet (81 mg total) by mouth in the morning. 30 tablet 11   atorvastatin (LIPITOR) 10 MG tablet Take 1 tablet (10 mg total) by mouth daily. 90 tablet 3   furosemide (LASIX) 20 MG tablet Take 20 mg by mouth as needed for edema.     gabapentin (NEURONTIN) 300 MG capsule Take 300 mg by mouth 2 (two) times daily.     midodrine (PROAMATINE) 5 MG tablet Take 5 mg by mouth 2 (two) times daily with a meal.     nitroGLYCERIN (NITROSTAT) 0.4 MG SL tablet Place 1 tablet (0.4 mg total) under the tongue every 5 (five) minutes as needed for chest pain. 25 tablet 2   polyethylene glycol powder (GLYCOLAX/MIRALAX) 17 GM/SCOOP powder Take 8.5 g by mouth daily. 255 g 0   No current facility-administered medications for this visit.    Allergies as of 10/13/2022   (No Known Allergies)    Family History  Problem Relation Age of Onset   Diabetes Mellitus II Brother        x2 brothers   Alzheimer's disease Mother    Diabetes Brother    Diabetes Maternal Uncle     Social History   Socioeconomic History   Marital status: Widowed    Spouse name: Not on file   Number of children: Not on file   Years of education: Not on file   Highest education level: Not on file  Occupational History   Occupation: Retired  Tobacco Use   Smoking status: Former     Packs/day: 1.00    Years: 27.00    Total pack years: 27.00    Types: Cigarettes    Start date: 12/30/1943    Quit date: 12/29/1970    Years since quitting: 51.8    Passive exposure: Past   Smokeless tobacco: Never   Tobacco comments:    "stopped smoking in 1970s when I got pneumonia"  Vaping Use   Vaping Use: Never used  Substance and Sexual Activity   Alcohol use: Not Currently    Comment: beer every once in awhile recommended by doctor for kidneys    Drug use: Never   Sexual activity: Not Currently  Other Topics Concern   Not on file  Social History Narrative  Lives alone    Right handed   Caffeine: decaf coffee once a week or so    Social Determinants of Health   Financial Resource Strain: Not on file  Food Insecurity: Not on file  Transportation Needs: Not on file  Physical Activity: Not on file  Stress: Not on file  Social Connections: Not on file   Review of systems General: negative for malaise, night sweats, fever, chills,+weight loss Neck: Negative for lumps, goiter, pain and significant neck swelling Resp: Negative for cough, wheezing, dyspnea at rest CV: Negative for chest pain, leg swelling, palpitations, orthopnea GI: denies hematochezia, nausea, vomiting, diarrhea, constipation, dysphagia, odyonophagia, +weight loss +early satiety +bloating +melena MSK: Negative for joint pain or swelling, back pain, and muscle pain. Derm: Negative for itching or rash Psych: Denies depression, anxiety, memory loss, confusion. No homicidal or suicidal ideation.  Heme: Negative for prolonged bleeding, bruising easily, and swollen nodes. Endocrine: Negative for cold or heat intolerance, polyuria, polydipsia and goiter. Neuro: negative for tremor, gait imbalance, syncope and seizures. The remainder of the review of systems is noncontributory.  Physical Exam: There were no vitals taken for this visit. General:   Alert and oriented. No distress noted. Pleasant and cooperative.   Head:  Normocephalic and atraumatic. Eyes:  Conjuctiva clear without scleral icterus. Mouth:  Oral mucosa pink and moist. Good dentition. No lesions. Heart: Normal rate and rhythm, s1 and s2 heart sounds present.  Lungs: Clear lung sounds in all lobes. Respirations equal and unlabored. Abdomen:  +BS, soft, non-tender and non-distended. No rebound or guarding. No HSM or masses noted. Derm: No palmar erythema or jaundice Msk:  Symmetrical without gross deformities. Normal posture. Extremities:  Without edema. Neurologic:  Alert and  oriented x4 Psych:  Alert and cooperative. Normal mood and affect.  Invalid input(s): "6 MONTHS"   ASSESSMENT: Jimmy Knox is a 88 y.o. male presenting today for early satiety, bloating and melena.  Patient continues with bloating, especially after eating with melena a few times per week. He is doing miralax BID with a BM almost daily, though recently with smaller volume of stool since food intake has decreased some. Notably down 6 pounds since March. Patient reports eating only about 1 good meal per day, however, doesn't seem to matter how much he eats, he feels full right after. He is not taking any NSAIDs other than baby ASA, notable history of H pylori that was treated in 2022 with stool antigen confirming eradication previously. He is certainly at risk for PUD given his age and hx of h pylori and I have a low threshold to repeat endoscopic evaluation given his myriad of symptoms which includes melena and weight loss. Recommend proceeding with EGD for further evaluation. Will start low dose PPI in the meantime. He should avoid all NSAIDs.     PLAN:  Rx protonix 50m daily  2.  EGD ASA III, ENDO 3 3. Continue Miralax BID  4. Avoid NSAIDs.   All questions were answered, patient verbalized understanding and is in agreement with plan as outlined above.   Follow Up: 3 months  Jimmy Knox L. CAlver Sorrow MSN, APRN, AGNP-C Adult-Gerontology Nurse  Practitioner R[facility] Hospital[facility]  I have reviewed the note and agree with the APP's assessment as described in this progress note  Patient may need SIBO breath test depending on endoscopic findings  DMaylon Peppers MD Gastroenterology and [facility]

## 2022-10-13 NOTE — H&P (View-Only) (Signed)
Referring Provider: Asencion Noble, MD Primary Care Physician:  Asencion Noble, MD Primary GI Physician: Jenetta Downer   Chief Complaint  Patient presents with   Follow-up    Pt complains of a lot of bloating. Not painful but bloating decreases appetite   HPI:   Jimmy Knox is a 88 y.o. male with past medical history of anxiety, arthritis, coronary artery disease, hypertension, hyperlipidemia, ischemic cardiomyopathy, diabetes, melanoma, history of constipation  Patient presenting today for bloating.  Last seen in June, at that time having frequent bloating, taking miralax 1/2 capful daily and two dulcolax daily. Having a BM every 4 days, but with urgency and massive diarrhea when he does. Occasional black stools.   Recommended to increase miralax to 2 capfuls/day, stop dulcolax.   Present:   Patient reports that he is still feeling bloated, reports that he often has this feeling after eating, noting that he feels full no matter how much he eats. He is having a BM usually daily, notably smaller in volume since his food intake has declined. He is doing miralax 2 capfuls per day.  Appetite is not great, has lost about 6 pounds since March. Denies passing flatulence. Bloating does not improve with a BM. He is not taking anything for bloating. Denies any GERD symptoms, nausea or vomiting.  He denies rectal bleeding. Notes that stools continue to be black, maybe 3 times per week. Denies any NSAIDs other than baby aspirin.  Last EGD: 10/16/2021 - Normal esophagus. - Z-line irregular, 40 cm from the incisors. - Gastritis. Biopsied -positive for H. pylori gastritis without dysplasia or metaplasia. - Normal cardia, gastric fundus and pylorus. - Normal duodenal bulb and second portion of the duodenum.  Last colonoscopy 2016 - Examination performed to cecum. Small polyp cold snare from ascending colon. This polyp was lost. Small polyp removed with cold biopsy forceps from distal transverse  colon-tubular adenoma mild sigmoid colon diverticulosis. Small external hemorrhoids.   Past Medical History:  Diagnosis Date   Anxiety    Arthritis    "right hip" (07/15/2018)   Carotid artery disease ([place])    a. <50% bilaterally 05/2016.   Chronic combined systolic and diastolic CHF (congestive heart failure) ([place])    Complication of anesthesia    "didn't wake up well after OR on 07/30/2016; took 2-3 to hold me down"   Coronary artery disease    a. prior MI/stent around 2000. b. Nuc 09/2015 -> abnl, mgmd medically.   Facial neuralgia    History of blood transfusion 07/30/2016   "after the operation"   Hyperlipidemia    Hypertension    Ischemic cardiomyopathy    Melanoma of nose ([place])    "inside on the right side"   Myocardial infarction ([place]) ~ 2000   Pneumonia 1935- 1970s ,"several times"   [facility] spotted fever 1942   Skin cancer    "on my back; arms; froze them off"   Type 2 diabetes, diet controlled ([place])     Past Surgical History:  Procedure Laterality Date   APPENDECTOMY     BACK SURGERY     BIOPSY  10/16/2021   Procedure: BIOPSY;  Surgeon: Rogene Houston, MD;  [facility];  Service: Endoscopy;;   CATARACT EXTRACTION, BILATERAL Bilateral    COLONOSCOPY N/A 09/28/2015   Procedure: COLONOSCOPY;  Surgeon: Rogene Houston, MD;  [facility];  Service: Endoscopy;  Laterality: N/A;  [place]  CORONARY CTO INTERVENTION  07/15/2018   CORONARY CTO INTERVENTION N/A 07/15/2018   Procedure: CORONARY CTO INTERVENTION;  Surgeon: Martinique, Peter M, MD;  Location: [place] [facility];  Service: Cardiovascular;  Laterality: N/A;   CYSTOSCOPY W/ URETERAL STENT PLACEMENT     ESOPHAGOGASTRODUODENOSCOPY (EGD) WITH PROPOFOL N/A 10/16/2021   Procedure: ESOPHAGOGASTRODUODENOSCOPY (EGD) WITH PROPOFOL;  Surgeon: Rogene Houston, MD;  [facility];  Service: Endoscopy;  Laterality: N/A;  11:05   EYE SURGERY  Right 2013   "got hit by a limb & knocked my eye out"   JOINT REPLACEMENT     LEFT HEART CATH AND CORONARY ANGIOGRAPHY N/A 06/15/2018   Procedure: LEFT HEART CATH AND CORONARY ANGIOGRAPHY;  Surgeon: Martinique, Peter M, MD;  Location: [place] [facility];  Service: Cardiovascular;  Laterality: N/A;   LUMBAR [place] Right    "inside my nose"   TONSILLECTOMY     TOTAL HIP ARTHROPLASTY Right 07/29/2016   Procedure: TOTAL HIP ARTHROPLASTY;  Surgeon: Garald Balding, MD;  Location: [place];  Service: Orthopedics;  Laterality: Right;    Current Outpatient Medications  Medication Sig Dispense Refill   aspirin EC 81 MG tablet Take 1 tablet (81 mg total) by mouth in the morning. 30 tablet 11   atorvastatin (LIPITOR) 10 MG tablet Take 1 tablet (10 mg total) by mouth daily. 90 tablet 3   furosemide (LASIX) 20 MG tablet Take 20 mg by mouth as needed for edema.     gabapentin (NEURONTIN) 300 MG capsule Take 300 mg by mouth 2 (two) times daily.     midodrine (PROAMATINE) 5 MG tablet Take 5 mg by mouth 2 (two) times daily with a meal.     nitroGLYCERIN (NITROSTAT) 0.4 MG SL tablet Place 1 tablet (0.4 mg total) under the tongue every 5 (five) minutes as needed for chest pain. 25 tablet 2   polyethylene glycol powder (GLYCOLAX/MIRALAX) 17 GM/SCOOP powder Take 8.5 g by mouth daily. 255 g 0   No current facility-administered medications for this visit.    Allergies as of 10/13/2022   (No Known Allergies)    Family History  Problem Relation Age of Onset   Diabetes Mellitus II Brother        x2 brothers   Alzheimer's disease Mother    Diabetes Brother    Diabetes Maternal Uncle     Social History   Socioeconomic History   Marital status: Widowed    Spouse name: Not on file   Number of children: Not on file   Years of education: Not on file   Highest education level: Not on file  Occupational History   Occupation: Retired  Tobacco Use   Smoking status: Former     Packs/day: 1.00    Years: 27.00    Total pack years: 27.00    Types: Cigarettes    Start date: 12/30/1943    Quit date: 12/29/1970    Years since quitting: 51.8    Passive exposure: Past   Smokeless tobacco: Never   Tobacco comments:    "stopped smoking in 1970s when I got pneumonia"  Vaping Use   Vaping Use: Never used  Substance and Sexual Activity   Alcohol use: Not Currently    Comment: beer every once in awhile recommended by doctor for kidneys    Drug use: Never   Sexual activity: Not Currently  Other Topics Concern   Not on file  Social History Narrative  Lives alone    Right handed   Caffeine: decaf coffee once a week or so    Social Determinants of Health   Financial Resource Strain: Not on file  Food Insecurity: Not on file  Transportation Needs: Not on file  Physical Activity: Not on file  Stress: Not on file  Social Connections: Not on file   Review of systems General: negative for malaise, night sweats, fever, chills,+weight loss Neck: Negative for lumps, goiter, pain and significant neck swelling Resp: Negative for cough, wheezing, dyspnea at rest CV: Negative for chest pain, leg swelling, palpitations, orthopnea GI: denies hematochezia, nausea, vomiting, diarrhea, constipation, dysphagia, odyonophagia, +weight loss +early satiety +bloating +melena MSK: Negative for joint pain or swelling, back pain, and muscle pain. Derm: Negative for itching or rash Psych: Denies depression, anxiety, memory loss, confusion. No homicidal or suicidal ideation.  Heme: Negative for prolonged bleeding, bruising easily, and swollen nodes. Endocrine: Negative for cold or heat intolerance, polyuria, polydipsia and goiter. Neuro: negative for tremor, gait imbalance, syncope and seizures. The remainder of the review of systems is noncontributory.  Physical Exam: There were no vitals taken for this visit. General:   Alert and oriented. No distress noted. Pleasant and cooperative.   Head:  Normocephalic and atraumatic. Eyes:  Conjuctiva clear without scleral icterus. Mouth:  Oral mucosa pink and moist. Good dentition. No lesions. Heart: Normal rate and rhythm, s1 and s2 heart sounds present.  Lungs: Clear lung sounds in all lobes. Respirations equal and unlabored. Abdomen:  +BS, soft, non-tender and non-distended. No rebound or guarding. No HSM or masses noted. Derm: No palmar erythema or jaundice Msk:  Symmetrical without gross deformities. Normal posture. Extremities:  Without edema. Neurologic:  Alert and  oriented x4 Psych:  Alert and cooperative. Normal mood and affect.  Invalid input(s): "6 MONTHS"   ASSESSMENT: Jimmy Knox is a 88 y.o. male presenting today for early satiety, bloating and melena.  Patient continues with bloating, especially after eating with melena a few times per week. He is doing miralax BID with a BM almost daily, though recently with smaller volume of stool since food intake has decreased some. Notably down 6 pounds since March. Patient reports eating only about 1 good meal per day, however, doesn't seem to matter how much he eats, he feels full right after. He is not taking any NSAIDs other than baby ASA, notable history of H pylori that was treated in 2022 with stool antigen confirming eradication previously. He is certainly at risk for PUD given his age and hx of h pylori and I have a low threshold to repeat endoscopic evaluation given his myriad of symptoms which includes melena and weight loss. Recommend proceeding with EGD for further evaluation. Will start low dose PPI in the meantime. He should avoid all NSAIDs.     PLAN:  Rx protonix 98m daily  2.  EGD ASA III, ENDO 3 3. Continue Miralax BID  4. Avoid NSAIDs.   All questions were answered, patient verbalized understanding and is in agreement with plan as outlined above.   Follow Up: 3 months  Emmani Lesueur L. CAlver Sorrow MSN, APRN, AGNP-C Adult-Gerontology Nurse  Practitioner REndoscopy Center At Redbird Squarefor GI Diseases  I have reviewed the note and agree with the APP's assessment as described in this progress note  Patient may need SIBO breath test depending on endoscopic findings  DMaylon Peppers MD Gastroenterology and HPark LayneGastroenterology

## 2022-10-14 NOTE — Patient Instructions (Signed)
[place]  10/14/2022     '@PREFPERIOPPHARMACY'$ @   Your procedure is scheduled on  10/16/2022.   Report to [facility] at  0900  A.M.   Call this number if you have problems the morning of surgery:  [phone]  If you experience any cold or flu symptoms such as cough, fever, chills, shortness of breath, etc. between now and your scheduled surgery, please notify us at the above number.   Remember:  Follow the diet instructions given to you by the office.     Take these medicines the morning of surgery with A SIP OF WATER                                   gabapentin, protonix.     Do not wear jewelry, make-up or nail polish.  Do not wear lotions, powders, or perfumes, or deodorant.  Do not shave 48 hours prior to surgery.  Men may shave face and neck.  Do not bring valuables to the hospital.  [facility] is not responsible for any belongings or valuables.  Contacts, dentures or bridgework may not be worn into surgery.  Leave your suitcase in the car.  After surgery it may be brought to your room.  For patients admitted to the hospital, discharge time will be determined by your treatment team.  Patients discharged the day of surgery will not be allowed to drive home and must have someone with them for 24 hours.    Special instructions:   DO NOT smoke tobacco or vape for 24 hours before your procedure.  Please read over the following fact sheets that you were given. Anesthesia Post-op Instructions and Care and Recovery After Surgery      Upper Endoscopy, Adult, Care After After the procedure, it is common to have a sore throat. It is also common to have: Mild stomach pain or discomfort. Bloating. Nausea. Follow these instructions at home: The instructions below may help you care for yourself at home. Your health care provider may give you more instructions. If you have questions, ask your health care provider. If you were given a sedative during the  procedure, it can affect you for several hours. Do not drive or operate machinery until your health care provider says that it is safe. If you will be going home right after the procedure, plan to have a responsible adult: Take you home from the hospital or clinic. You will not be allowed to drive. Care for you for the time you are told. Follow instructions from your health care provider about what you may eat and drink. Return to your normal activities as told by your health care provider. Ask your health care provider what activities are safe for you. Take over-the-counter and prescription medicines only as told by your health care provider. Contact a health care provider if you: Have a sore throat that lasts longer than one day. Have trouble swallowing. Have a fever. Get help right away if you: Vomit blood or your vomit looks like coffee grounds. Have bloody, black, or tarry stools. Have a very bad sore throat or you cannot swallow. Have difficulty breathing or very bad pain in your chest or abdomen. These symptoms may be an emergency. Get help right away. Call 911. Do not wait to see if the symptoms will go away. Do not drive yourself to the hospital. Summary  After the procedure, it is common to have a sore throat, mild stomach discomfort, bloating, and nausea. If you were given a sedative during the procedure, it can affect you for several hours. Do not drive until your health care provider says that it is safe. Follow instructions from your health care provider about what you may eat and drink. Return to your normal activities as told by your health care provider. This information is not intended to replace advice given to you by your health care provider. Make sure you discuss any questions you have with your health care provider. Document Revised: 03/26/2022 Document Reviewed: 03/26/2022 Elsevier Patient Education  [place] After This  sheet gives you information about how to care for yourself after your procedure. Your health care provider may also give you more specific instructions. If you have problems or questions, contact your health care provider. What can I expect after the procedure? After the procedure, it is common to have: Tiredness. Forgetfulness about what happened after the procedure. Impaired judgment for important decisions. Nausea or vomiting. Some difficulty with balance. Follow these instructions at home: For the time period you were told by your health care provider:     Rest as needed. Do not participate in activities where you could fall or become injured. Do not drive or use machinery. Do not drink alcohol. Do not take sleeping pills or medicines that cause drowsiness. Do not make important decisions or sign legal documents. Do not take care of children on your own. Eating and drinking Follow the diet that is recommended by your health care provider. Drink enough fluid to keep your urine pale yellow. If you vomit: Drink water, juice, or soup when you can drink without vomiting. Make sure you have little or no nausea before eating solid foods. General instructions Have a responsible adult stay with you for the time you are told. It is important to have someone help care for you until you are awake and alert. Take over-the-counter and prescription medicines only as told by your health care provider. If you have sleep apnea, surgery and certain medicines can increase your risk for breathing problems. Follow instructions from your health care provider about wearing your sleep device: Anytime you are sleeping, including during daytime naps. While taking prescription pain medicines, sleeping medicines, or medicines that make you drowsy. Avoid smoking. Keep all follow-up visits as told by your health care provider. This is important. Contact a health care provider if: You keep feeling nauseous or  you keep vomiting. You feel light-headed. You are still sleepy or having trouble with balance after 24 hours. You develop a rash. You have a fever. You have redness or swelling around the IV site. Get help right away if: You have trouble breathing. You have new-onset confusion at home. Summary For several hours after your procedure, you may feel tired. You may also be forgetful and have poor judgment. Have a responsible adult stay with you for the time you are told. It is important to have someone help care for you until you are awake and alert. Rest as told. Do not drive or operate machinery. Do not drink alcohol or take sleeping pills. Get help right away if you have trouble breathing, or if you suddenly become confused. This information is not intended to replace advice given to you by your health care provider. Make sure you discuss any questions you have with your health care provider. Document Revised: 11/19/2021 Document Reviewed:  11/17/2019 Elsevier Patient Education  [place].

## 2022-10-15 ENCOUNTER — Encounter (HOSPITAL_COMMUNITY)
Admission: RE | Admit: 2022-10-15 | Discharge: 2022-10-15 | Disposition: A | Discharge status: Home / Self Care | Payer: Medicare Other | Source: Ambulatory Visit | Attending: Gastroenterology | Admitting: Gastroenterology

## 2022-10-15 DIAGNOSIS — R634 Abnormal weight loss: Secondary | ICD-10-CM | POA: Insufficient documentation

## 2022-10-15 DIAGNOSIS — Z01812 Encounter for preprocedural laboratory examination: Secondary | ICD-10-CM | POA: Diagnosis not present

## 2022-10-15 DIAGNOSIS — R6881 Early satiety: Secondary | ICD-10-CM | POA: Diagnosis not present

## 2022-10-15 DIAGNOSIS — K921 Melena: Secondary | ICD-10-CM | POA: Insufficient documentation

## 2022-10-15 LAB — BASIC METABOLIC PANEL
Anion gap: 7 (ref 5–15)
BUN: 24 mg/dL — ABNORMAL HIGH (ref 8–23)
CO2: 25 mmol/L (ref 22–32)
Calcium: 9 mg/dL (ref 8.9–10.3)
Chloride: 107 mmol/L (ref 98–111)
Creatinine, Ser: 1.47 mg/dL — ABNORMAL HIGH (ref 0.61–1.24)
GFR, Estimated: 46 mL/min — ABNORMAL LOW (ref >60)
Glucose, Bld: 120 mg/dL — ABNORMAL HIGH (ref 70–99)
Potassium: 4.8 mmol/L (ref 3.5–5.1)
Sodium: 139 mmol/L (ref 135–145)

## 2022-10-16 ENCOUNTER — Ambulatory Visit (HOSPITAL_COMMUNITY): Payer: Medicare Other | Admitting: Anesthesiology

## 2022-10-16 ENCOUNTER — Ambulatory Visit (HOSPITAL_BASED_OUTPATIENT_CLINIC_OR_DEPARTMENT_OTHER): Payer: Medicare Other | Admitting: Anesthesiology

## 2022-10-16 ENCOUNTER — Encounter (HOSPITAL_COMMUNITY)
Admission: RE | Disposition: A | Discharge status: Home / Self Care | Payer: Self-pay | Source: Ambulatory Visit | Attending: Gastroenterology

## 2022-10-16 ENCOUNTER — Encounter (HOSPITAL_COMMUNITY): Payer: Self-pay | Admitting: Gastroenterology

## 2022-10-16 ENCOUNTER — Ambulatory Visit (HOSPITAL_COMMUNITY)
Admission: RE | Admit: 2022-10-16 | Discharge: 2022-10-16 | Disposition: A | Discharge status: Home / Self Care | Payer: Medicare Other | Source: Ambulatory Visit | Attending: Gastroenterology | Admitting: Gastroenterology

## 2022-10-16 ENCOUNTER — Other Ambulatory Visit: Payer: Self-pay

## 2022-10-16 DIAGNOSIS — Z955 Presence of coronary angioplasty implant and graft: Secondary | ICD-10-CM | POA: Diagnosis not present

## 2022-10-16 DIAGNOSIS — I110 Hypertensive heart disease with heart failure: Secondary | ICD-10-CM | POA: Insufficient documentation

## 2022-10-16 DIAGNOSIS — Z87891 Personal history of nicotine dependence: Secondary | ICD-10-CM | POA: Insufficient documentation

## 2022-10-16 DIAGNOSIS — I2510 Atherosclerotic heart disease of native coronary artery without angina pectoris: Secondary | ICD-10-CM | POA: Diagnosis not present

## 2022-10-16 DIAGNOSIS — R1010 Upper abdominal pain, unspecified: Secondary | ICD-10-CM

## 2022-10-16 DIAGNOSIS — Z8619 Personal history of other infectious and parasitic diseases: Secondary | ICD-10-CM | POA: Diagnosis not present

## 2022-10-16 DIAGNOSIS — K319 Disease of stomach and duodenum, unspecified: Secondary | ICD-10-CM | POA: Diagnosis not present

## 2022-10-16 DIAGNOSIS — K921 Melena: Secondary | ICD-10-CM

## 2022-10-16 DIAGNOSIS — Z85820 Personal history of malignant melanoma of skin: Secondary | ICD-10-CM | POA: Diagnosis not present

## 2022-10-16 DIAGNOSIS — R634 Abnormal weight loss: Secondary | ICD-10-CM | POA: Diagnosis not present

## 2022-10-16 DIAGNOSIS — I5042 Chronic combined systolic (congestive) and diastolic (congestive) heart failure: Secondary | ICD-10-CM | POA: Insufficient documentation

## 2022-10-16 DIAGNOSIS — I252 Old myocardial infarction: Secondary | ICD-10-CM | POA: Diagnosis not present

## 2022-10-16 DIAGNOSIS — E785 Hyperlipidemia, unspecified: Secondary | ICD-10-CM | POA: Insufficient documentation

## 2022-10-16 DIAGNOSIS — R6881 Early satiety: Secondary | ICD-10-CM

## 2022-10-16 DIAGNOSIS — K3189 Other diseases of stomach and duodenum: Secondary | ICD-10-CM

## 2022-10-16 DIAGNOSIS — I10 Essential (primary) hypertension: Secondary | ICD-10-CM | POA: Diagnosis not present

## 2022-10-16 DIAGNOSIS — R140 Abdominal distension (gaseous): Secondary | ICD-10-CM | POA: Insufficient documentation

## 2022-10-16 DIAGNOSIS — E119 Type 2 diabetes mellitus without complications: Secondary | ICD-10-CM | POA: Diagnosis not present

## 2022-10-16 HISTORY — PX: BIOPSY: SHX5522

## 2022-10-16 HISTORY — PX: ESOPHAGOGASTRODUODENOSCOPY (EGD) WITH PROPOFOL: SHX5813

## 2022-10-16 LAB — GLUCOSE, CAPILLARY: Glucose-Capillary: 119 mg/dL — ABNORMAL HIGH (ref 70–99)

## 2022-10-16 SURGERY — ESOPHAGOGASTRODUODENOSCOPY (EGD) WITH PROPOFOL
Anesthesia: General

## 2022-10-16 MED ORDER — PROPOFOL 10 MG/ML IV BOLUS
INTRAVENOUS | Status: DC | PRN
  Administered 2022-10-16: 100 mg via INTRAVENOUS

## 2022-10-16 MED ORDER — LACTATED RINGERS IV SOLN: INTRAVENOUS | Status: DC

## 2022-10-16 MED ORDER — LIDOCAINE HCL 1 % IJ SOLN
INTRAMUSCULAR | Status: DC | PRN
  Administered 2022-10-16: 50 mg via INTRADERMAL

## 2022-10-16 MED ORDER — EPHEDRINE SULFATE (PRESSORS) 50 MG/ML IJ SOLN
INTRAMUSCULAR | Status: DC | PRN
  Administered 2022-10-16: 10 mg via INTRAVENOUS

## 2022-10-16 NOTE — Anesthesia Postprocedure Evaluation (Signed)
Anesthesia Post Note  Patient: Jimmy Knox  Procedure(s) Performed: ESOPHAGOGASTRODUODENOSCOPY (EGD) WITH PROPOFOL BIOPSY  Patient location during evaluation: Short Stay Anesthesia Type: General Level of consciousness: awake and alert Pain management: pain level controlled Vital Signs Assessment: post-procedure vital signs reviewed and stable Respiratory status: spontaneous breathing Cardiovascular status: blood pressure returned to baseline and stable Postop Assessment: no apparent nausea or vomiting Anesthetic complications: no   No notable events documented.   Last Vitals:  Vitals:   10/16/22 1121 10/16/22 1123  BP: 108/62 105/64  Pulse: 67 73  Resp: 16 19  Temp:    SpO2: 96% 95%    Last Pain:  Vitals:   10/16/22 1123  TempSrc:   PainSc: 0-No pain                 Jyl [place]

## 2022-10-16 NOTE — Discharge Instructions (Signed)
You are being discharged to home.  Resume your previous diet.  We are waiting for your pathology results.  Continue your present medications.  If negative biopsies for H. pylori, will need to do breath test 2 weeks off PPI.

## 2022-10-16 NOTE — Transfer of Care (Addendum)
Immediate Anesthesia Transfer of Care Note  Patient: Jimmy Knox  Procedure(s) Performed: ESOPHAGOGASTRODUODENOSCOPY (EGD) WITH PROPOFOL BIOPSY  Patient Location: Short Stay  Anesthesia Type:General  Level of Consciousness: sedated  Airway & Oxygen Therapy: Patient Spontanous Breathing  Post-op Assessment: Report given to RN  Post vital signs: Reviewed and stable  Last Vitals:  Vitals Value Taken Time  BP 105/64   Temp    Pulse 73   Resp 19   SpO2 95     Last Pain:  Vitals:   10/16/22 1057  TempSrc:   PainSc: 0-No pain      Patients Stated Pain Goal: 6 (96/[id number])  Complications: No notable events documented.

## 2022-10-16 NOTE — Interval H&P Note (Signed)
History and Physical Interval Note:  10/16/2022 10:16 AM  Jimmy Knox  has presented today for surgery, with the diagnosis of Melena early satiety weight loss.  The various methods of treatment have been discussed with the patient and family. After consideration of risks, benefits and other options for treatment, the patient has consented to  Procedure(s) with comments: ESOPHAGOGASTRODUODENOSCOPY (EGD) WITH PROPOFOL (N/A) - 1130 ASA 3 as a surgical intervention.  The patient's history has been reviewed, patient examined, no change in status, stable for surgery.  I have reviewed the patient's chart and labs.  Questions were answered to the patient's satisfaction.     [name]

## 2022-10-16 NOTE — Anesthesia Preprocedure Evaluation (Signed)
Anesthesia Evaluation  Patient identified by MRN, date of birth, ID band Patient awake    Reviewed: Allergy & Precautions, H&P , NPO status , Patient's Chart, lab work & pertinent test results, reviewed documented beta blocker date and time   History of Anesthesia Complications (+) Emergence Delirium and history of anesthetic complications  Airway Mallampati: II  TM Distance: >3 FB Neck ROM: full    Dental no notable dental hx.    Pulmonary neg pulmonary ROS, former smoker,    Pulmonary exam normal breath sounds clear to auscultation       Cardiovascular Exercise Tolerance: Good hypertension, + CAD, + Past MI and + Cardiac Stents   Rhythm:regular Rate:Normal     Neuro/Psych PSYCHIATRIC DISORDERS Anxiety negative neurological ROS     GI/Hepatic negative GI ROS, Neg liver ROS,   Endo/Other  negative endocrine ROSdiabetes, Type 2  Renal/GU negative Renal ROS  negative genitourinary   Musculoskeletal   Abdominal   Peds  Hematology negative hematology ROS (+)   Anesthesia Other Findings 1. Left ventricular ejection fraction, by estimation, is 45 to 50%. The  left ventricle has mildly decreased function. The left ventricle  demonstrates global hypokinesis. There is mild left ventricular  hypertrophy of the septal segment. Grade  indeterminate diastolic dysfunction. Elevated left ventricular  end-diastolic pressure.  2. Right ventricular systolic function is normal. The right ventricular  size is normal.  3. The mitral valve is grossly normal. No evidence of mitral valve  regurgitation.  4. The aortic valve is tricuspid. Aortic valve regurgitation is mild. No  aortic stenosis is present.   Reproductive/Obstetrics negative OB ROS                             Anesthesia Physical  Anesthesia Plan  ASA: 3  Anesthesia Plan: General   Post-op Pain Management:    Induction:   PONV  Risk Score and Plan: Propofol infusion  Airway Management Planned:   Additional Equipment:   Intra-op Plan:   Post-operative Plan:   Informed Consent: I have reviewed the patients History and Physical, chart, labs and discussed the procedure including the risks, benefits and alternatives for the proposed anesthesia with the patient or authorized representative who has indicated his/her understanding and acceptance.     Dental Advisory Given  Plan Discussed with: CRNA  Anesthesia Plan Comments:         Anesthesia Quick Evaluation

## 2022-10-16 NOTE — Op Note (Signed)
[facility] Patient Name: Jimmy Knox Procedure Date: 10/16/2022 10:44 AM MRN: [id number] Date of Birth: May 05, 1932 Attending MD: Maylon Peppers ,  CSN: [id number] Age: 88 Admit Type: Outpatient Procedure:                Upper GI endoscopy Indications:              Upper abdominal pain, Previously treated for                            Helicobacter pylori Providers:                Maylon Peppers, Janeece Riggers, RN, Ladoris Gene                            Technician, Technician Referring MD:              Medicines:                Monitored Anesthesia Care Complications:            No immediate complications. Estimated Blood Loss:     Estimated blood loss: none. Procedure:                Pre-Anesthesia Assessment:                           - Prior to the procedure, a History and Physical                            was performed, and patient medications, allergies                            and sensitivities were reviewed. The patient's                            tolerance of previous anesthesia was reviewed.                           - The risks and benefits of the procedure and the                            sedation options and risks were discussed with the                            patient. All questions were answered and informed                            consent was obtained.                           - ASA Grade Assessment: III - A patient with severe                            systemic disease.                           After obtaining informed consent, the endoscope was  passed under direct vision. Throughout the                            procedure, the patient's blood pressure, pulse, and                            oxygen saturations were monitored continuously. The                            [id number]-[id number] ([id number]) scope was introduced through the                            mouth, and advanced to the second part of duodenum.                             The upper GI endoscopy was accomplished without                            difficulty. The patient tolerated the procedure                            well. Scope In: 11:02:25 AM Scope Out: 11:08:26 AM Total Procedure Duration: 0 hours 6 minutes 1 second  Findings:      The examined esophagus was normal.      Multiple dispersed small erosions with no stigmata of recent bleeding       were found in the gastric antrum. Biopsies were taken with a cold       forceps for Helicobacter pylori testing.      The examined duodenum was normal. Impression:               - Normal esophagus.                           - Erosive gastropathy with no stigmata of recent                            bleeding. Biopsied.                           - Normal examined duodenum. Moderate Sedation:      Per Anesthesia Care Recommendation:           - Discharge patient to home (ambulatory).                           - Resume previous diet.                           - Await pathology results.                           - Continue present medications.                           - If negative biopsies for H. pylori, will need to  do breath test 2 weeks off PPI. Procedure Code(s):        --- Professional ---                           [phone], Esophagogastroduodenoscopy, flexible,                            transoral; with biopsy, single or multiple Diagnosis Code(s):        --- Professional ---                           K31.89, Other diseases of stomach and duodenum                           R10.10, Upper abdominal pain, unspecified CPT copyright 2019 American Medical Association. All rights reserved. The codes documented in this report are preliminary and upon coder review may  be revised to meet current compliance requirements. Maylon Peppers, MD Maylon Peppers,  10/16/2022 11:18:44 AM This report has been signed electronically. Number of Addenda: 0

## 2022-10-17 LAB — SURGICAL PATHOLOGY

## 2022-10-21 ENCOUNTER — Encounter (HOSPITAL_COMMUNITY): Payer: Self-pay | Admitting: Gastroenterology

## 2022-10-22 ENCOUNTER — Other Ambulatory Visit: Payer: Self-pay

## 2022-10-22 DIAGNOSIS — B9681 Helicobacter pylori [H. pylori] as the cause of diseases classified elsewhere: Secondary | ICD-10-CM

## 2022-10-28 DIAGNOSIS — E118 Type 2 diabetes mellitus with unspecified complications: Secondary | ICD-10-CM | POA: Diagnosis not present

## 2022-10-28 DIAGNOSIS — I951 Orthostatic hypotension: Secondary | ICD-10-CM | POA: Diagnosis not present

## 2022-10-28 DIAGNOSIS — I5022 Chronic systolic (congestive) heart failure: Secondary | ICD-10-CM | POA: Diagnosis not present

## 2022-11-04 DIAGNOSIS — R7309 Other abnormal glucose: Secondary | ICD-10-CM | POA: Diagnosis not present

## 2022-11-04 DIAGNOSIS — E1169 Type 2 diabetes mellitus with other specified complication: Secondary | ICD-10-CM | POA: Diagnosis not present

## 2022-11-04 DIAGNOSIS — G4700 Insomnia, unspecified: Secondary | ICD-10-CM | POA: Diagnosis not present

## 2022-11-04 DIAGNOSIS — Z23 Encounter for immunization: Secondary | ICD-10-CM | POA: Diagnosis not present

## 2022-11-04 DIAGNOSIS — I959 Hypotension, unspecified: Secondary | ICD-10-CM | POA: Diagnosis not present

## 2022-11-06 DIAGNOSIS — B9681 Helicobacter pylori [H. pylori] as the cause of diseases classified elsewhere: Secondary | ICD-10-CM | POA: Diagnosis not present

## 2022-11-06 DIAGNOSIS — K2970 Gastritis, unspecified, without bleeding: Secondary | ICD-10-CM | POA: Diagnosis not present

## 2022-11-07 LAB — H. PYLORI BREATH TEST: H. pylori Breath Test: NOT DETECTED

## 2022-12-15 ENCOUNTER — Ambulatory Visit (INDEPENDENT_AMBULATORY_CARE_PROVIDER_SITE_OTHER): Payer: Medicare Other | Admitting: Gastroenterology

## 2022-12-18 DIAGNOSIS — C44629 Squamous cell carcinoma of skin of left upper limb, including shoulder: Secondary | ICD-10-CM | POA: Diagnosis not present

## 2022-12-18 DIAGNOSIS — L570 Actinic keratosis: Secondary | ICD-10-CM | POA: Diagnosis not present

## 2022-12-18 DIAGNOSIS — X32XXXD Exposure to sunlight, subsequent encounter: Secondary | ICD-10-CM | POA: Diagnosis not present

## 2022-12-18 DIAGNOSIS — L304 Erythema intertrigo: Secondary | ICD-10-CM | POA: Diagnosis not present

## 2023-01-08 DIAGNOSIS — I5022 Chronic systolic (congestive) heart failure: Secondary | ICD-10-CM | POA: Diagnosis not present

## 2023-01-08 DIAGNOSIS — E118 Type 2 diabetes mellitus with unspecified complications: Secondary | ICD-10-CM | POA: Diagnosis not present

## 2023-01-08 DIAGNOSIS — I951 Orthostatic hypotension: Secondary | ICD-10-CM | POA: Diagnosis not present

## 2023-01-08 DIAGNOSIS — Z79899 Other long term (current) drug therapy: Secondary | ICD-10-CM | POA: Diagnosis not present

## 2023-01-13 DIAGNOSIS — I493 Ventricular premature depolarization: Secondary | ICD-10-CM | POA: Diagnosis not present

## 2023-01-13 DIAGNOSIS — N390 Urinary tract infection, site not specified: Secondary | ICD-10-CM | POA: Diagnosis not present

## 2023-01-13 DIAGNOSIS — I2510 Atherosclerotic heart disease of native coronary artery without angina pectoris: Secondary | ICD-10-CM | POA: Diagnosis not present

## 2023-01-13 DIAGNOSIS — K5900 Constipation, unspecified: Secondary | ICD-10-CM | POA: Diagnosis not present

## 2023-01-13 DIAGNOSIS — E1169 Type 2 diabetes mellitus with other specified complication: Secondary | ICD-10-CM | POA: Diagnosis not present

## 2023-01-13 DIAGNOSIS — I5022 Chronic systolic (congestive) heart failure: Secondary | ICD-10-CM | POA: Diagnosis not present

## 2023-01-13 DIAGNOSIS — J84112 Idiopathic pulmonary fibrosis: Secondary | ICD-10-CM | POA: Diagnosis not present

## 2023-01-26 ENCOUNTER — Ambulatory Visit (INDEPENDENT_AMBULATORY_CARE_PROVIDER_SITE_OTHER): Payer: Medicare Other | Admitting: Gastroenterology

## 2023-01-26 ENCOUNTER — Encounter (INDEPENDENT_AMBULATORY_CARE_PROVIDER_SITE_OTHER): Payer: Self-pay | Admitting: Gastroenterology

## 2023-01-26 VITALS — BP 122/73 | HR 86 | Temp 98.0°F | Ht 69.0 in | Wt 194.9 lb

## 2023-01-26 DIAGNOSIS — K259 Gastric ulcer, unspecified as acute or chronic, without hemorrhage or perforation: Secondary | ICD-10-CM

## 2023-01-26 DIAGNOSIS — K5904 Chronic idiopathic constipation: Secondary | ICD-10-CM

## 2023-01-26 DIAGNOSIS — R140 Abdominal distension (gaseous): Secondary | ICD-10-CM

## 2023-01-26 NOTE — Patient Instructions (Addendum)
Please call back if interested to undergo gastric emptying study at [facility] Continue pantoprazole 40 mg qday Continue Miralax 2 capfuls every day. If persistent constipation, increase to 2 capfuls in AM and one at night.

## 2023-01-26 NOTE — Progress Notes (Signed)
Jimmy Knox, M.D. Gastroenterology & Hepatology [facility] [address] [place], [place] [address]  Primary Care Physician: Asencion Noble, MD [address] [place] [address]  I will communicate my assessment and recommendations to the referring MD via EMR.  Problems: Chronic bloating Erosive gastritis History of H. pylori gastritis status post eradication Chronic idiopathic constipation  History of Present Illness: Jimmy Knox is a 89 y.o. male with past medical history of anxiety, arthritis, coronary artery disease, hypertension, hyperlipidemia, ischemic cardiomyopathy, diabetes, melanoma  who presents for follow up of constipation, bloating and erosive gastritis.  The patient was last seen on 10/13/2022. At that time, the patient with findings described below.  Was also given prescription for Protonix 40 mg every day.  Notably, hydrogen breath test off PPI was negative.  Patient reports that he is feeling constantly bloated and full, even if he does not eat. He does not usually pass gas. He reports that his BM frequency varies, sometimes he defecates every day sometimes every 3-4 days and has to take mag citrate. Usually takes Miralax 2 capfuls every day. No bloating improvement with defecation.  The patient denies having any nausea, vomiting, fever, chills, hematochezia, melena, hematemesis,  abdominal pain, diarrhea, jaundice, pruritus or weight loss.  Last EGD: 10/16/2022 Normal esophagus, multiple erosions in the antrum which were biopsied, normal duodenum.  Path: A. SMALL BOWEL, BIOPSY:  -  Duodenal mucosa within normal limits.   B. STOMACH, BIOPSY:  -  Predominantly antral type mucosa with chemical/reactive gastropathy.  -  No Helicobacter pylori organisms identified on HE stained slide.    Last colonoscopy 2016 - Examination performed to cecum. Small polyp cold snare from ascending colon. This polyp  was lost. Small polyp removed with cold biopsy forceps from distal transverse colon-tubular adenoma mild sigmoid colon diverticulosis. Small external hemorrhoids.  Pathology -all polyps were tubular adenomas.  Past Medical History: Past Medical History:  Diagnosis Date   Anxiety    Arthritis    "right hip" (07/15/2018)   Carotid artery disease ([place])    a. <50% bilaterally 05/2016.   Chronic combined systolic and diastolic CHF (congestive heart failure) ([place])    Complication of anesthesia    "didn't wake up well after OR on 07/30/2016; took 2-3 to hold me down"   Coronary artery disease    a. prior MI/stent around 2000. b. Nuc 09/2015 -> abnl, mgmd medically.   Facial neuralgia    History of blood transfusion 07/30/2016   "after the operation"   Hyperlipidemia    Hypertension    Ischemic cardiomyopathy    Melanoma of nose ([place])    "inside on the right side"   Myocardial infarction ([place]) ~ 2000   Pneumonia 1935- 1970s ,"several times"   [facility] spotted fever 1942   Skin cancer    "on my back; arms; froze them off"   Type 2 diabetes, diet controlled ([place])     Past Surgical History: Past Surgical History:  Procedure Laterality Date   APPENDECTOMY     BACK SURGERY     BIOPSY  10/16/2021   Procedure: BIOPSY;  Surgeon: Rogene Houston, MD;  [facility];  Service: Endoscopy;;   BIOPSY  10/16/2022   Procedure: BIOPSY;  Surgeon: Harvel Quale, MD;  [facility];  Service: Gastroenterology;;   CATARACT EXTRACTION, BILATERAL Bilateral    COLONOSCOPY N/A 09/28/2015   Procedure: COLONOSCOPY;  Surgeon: Rogene Houston, MD;  [facility];  Service: Endoscopy;  Laterality: N/A;  955   CORONARY ANGIOPLASTY WITH STENT PLACEMENT  2000   CORONARY CTO INTERVENTION  07/15/2018   CORONARY CTO INTERVENTION N/A 07/15/2018   Procedure: CORONARY CTO INTERVENTION;  Surgeon: Martinique, Peter M, MD;  Location: [facility] [facility];  Service:  Cardiovascular;  Laterality: N/A;   CYSTOSCOPY W/ URETERAL STENT PLACEMENT     ESOPHAGOGASTRODUODENOSCOPY (EGD) WITH PROPOFOL N/A 10/16/2021   Procedure: ESOPHAGOGASTRODUODENOSCOPY (EGD) WITH PROPOFOL;  Surgeon: Rogene Houston, MD;  [facility];  Service: Endoscopy;  Laterality: N/A;  11:05   ESOPHAGOGASTRODUODENOSCOPY (EGD) WITH PROPOFOL N/A 10/16/2022   Procedure: ESOPHAGOGASTRODUODENOSCOPY (EGD) WITH PROPOFOL;  Surgeon: Harvel Quale, MD;  [facility];  Service: Gastroenterology;  Laterality: N/A;  1130 ASA 3   EYE SURGERY Right 2013   "got hit by a limb & knocked my eye out"   JOINT REPLACEMENT     LEFT HEART CATH AND CORONARY ANGIOGRAPHY N/A 06/15/2018   Procedure: LEFT HEART CATH AND CORONARY ANGIOGRAPHY;  Surgeon: Martinique, Peter M, MD;  Location: [place];  Service: Cardiovascular;  Laterality: N/A;   LUMBAR [place] Right    "inside my nose"   TONSILLECTOMY     TOTAL HIP ARTHROPLASTY Right 07/29/2016   Procedure: TOTAL HIP ARTHROPLASTY;  Surgeon: Garald Balding, MD;  Location: [place];  Service: Orthopedics;  Laterality: Right;    Family History: Family History  Problem Relation Age of Onset   Diabetes Mellitus II Brother        x2 brothers   Alzheimer's disease Mother    Diabetes Brother    Diabetes Maternal Uncle     Social History: Social History   Tobacco Use  Smoking Status Former   Packs/day: 1.00   Years: 27.00   Total pack years: 27.00   Types: Cigarettes   Start date: 12/30/1943   Quit date: 12/29/1970   Years since quitting: 52.1   Passive exposure: Past  Smokeless Tobacco Never  Tobacco Comments   "stopped smoking in 1970s when I got pneumonia"   Social History   Substance and Sexual Activity  Alcohol Use Not Currently   Comment: beer every once in awhile recommended by doctor for kidneys    Social History   Substance and Sexual Activity  Drug Use Never    Allergies: No  Known Allergies  Medications: Current Outpatient Medications  Medication Sig Dispense Refill   aspirin EC 81 MG tablet Take 1 tablet (81 mg total) by mouth in the morning. 30 tablet 11   atorvastatin (LIPITOR) 10 MG tablet Take 1 tablet (10 mg total) by mouth daily. 90 tablet 3   bismuth subsalicylate (PEPTO BISMOL) 262 MG chewable tablet Chew 262-524 mg by mouth as needed for indigestion.     Carboxymethylcellulose Sodium (EYE DROPS OP) Place 1 drop into both eyes daily as needed (itching eyes).     furosemide (LASIX) 20 MG tablet Take 20 mg by mouth daily.     gabapentin (NEURONTIN) 300 MG capsule Take 300 mg by mouth daily.     midodrine (PROAMATINE) 5 MG tablet Take 5 mg by mouth 2 (two) times daily with a meal.     nitroGLYCERIN (NITROSTAT) 0.4 MG SL tablet Place 1 tablet (0.4 mg total) under the tongue every 5 (five) minutes as needed for chest pain. 25 tablet 2   pantoprazole (PROTONIX) 40 MG tablet Take 1 tablet (40  mg total) by mouth daily before breakfast. 30 tablet 3   polyethylene glycol powder (GLYCOLAX/MIRALAX) 17 GM/SCOOP powder Take 8.5 g by mouth daily. 255 g 0   zolpidem (AMBIEN) 10 MG tablet Take 10 mg by mouth at bedtime.     No current facility-administered medications for this visit.    Review of Systems: GENERAL: negative for malaise, night sweats HEENT: No changes in hearing or vision, no nose bleeds or other nasal problems. NECK: Negative for lumps, goiter, pain and significant neck swelling RESPIRATORY: Negative for cough, wheezing CARDIOVASCULAR: Negative for chest pain, leg swelling, palpitations, orthopnea GI: SEE HPI MUSCULOSKELETAL: Negative for joint pain or swelling, back pain, and muscle pain. SKIN: Negative for lesions, rash PSYCH: Negative for sleep disturbance, mood disorder and recent psychosocial stressors. HEMATOLOGY Negative for prolonged bleeding, bruising easily, and swollen nodes. ENDOCRINE: Negative for cold or heat intolerance, polyuria,  polydipsia and goiter. NEURO: negative for tremor, gait imbalance, syncope and seizures. The remainder of the review of systems is noncontributory.   Physical Exam: BP 122/73 (BP Location: Left Arm, Patient Position: Sitting, Cuff Size: Small)   Pulse 86   Temp 98 F (36.7 C) (Temporal)   Ht '5\' 9"'$  (1.753 m)   Wt 194 lb 14.4 oz (88.4 kg)   BMI 28.78 kg/m  GENERAL: The patient is AO x3, in no acute distress. HEENT: Head is normocephalic and atraumatic. EOMI are intact. Mouth is well hydrated and without lesions. NECK: Supple. No masses LUNGS: Clear to auscultation. No presence of rhonchi/wheezing/rales. Adequate chest expansion HEART: RRR, normal s1 and s2. ABDOMEN: Soft, nontender, no guarding, no peritoneal signs, and nondistended. BS +. No masses. EXTREMITIES: Without any cyanosis, clubbing, rash, lesions or edema. NEUROLOGIC: AOx3, no focal motor deficit. SKIN: no jaundice, no rashes  Imaging/Labs: as above  I personally reviewed and interpreted the available labs, imaging and endoscopic files.  Impression and Plan: TIMOTY BOURKE is a 89 y.o. male with past medical history of anxiety, arthritis, coronary artery disease, hypertension, hyperlipidemia, ischemic cardiomyopathy, diabetes, melanoma  who presents for follow up of constipation, bloating and erosive gastritis.  Patient has presented chronic bloating for several months without any significant weight loss or any other red flag signs.  Underwent endoscopic evaluation recently that only showed some gastric erosion but no overt intraluminal pathology.  He underwent a CT of the abdomen and pelvis with IV contrast in 2022 which was negative for any gastrointestinal abnormality.  I discussed with the patient and his family member possibility of evaluating this further with a gastric emptying study and a possible SIBO breath test.  At this time they would like to hold off on performing any more investigations and they will reach me  if he is interested in evaluating this further.  As he had evidence of persistent erosions in his stomach he should continue with pantoprazole 40 mg every day, which he has been tolerating adequately.  Finally, I advised to increase his MiraLAX on the days he is presenting worsening constipation to avoid taking magnesium citrate unless strictly necessary.  -Patient to call back if interested to undergo gastric emptying study - Continue pantoprazole 40 mg qday - Continue Miralax 2 capfuls every day. If persistent constipation, increase to 2 capfuls in AM and one at night.   All questions were answered.      Jimmy Peppers, MD Gastroenterology and Hepatology [facility]

## 2023-02-16 DIAGNOSIS — L720 Epidermal cyst: Secondary | ICD-10-CM | POA: Diagnosis not present

## 2023-02-16 DIAGNOSIS — Z08 Encounter for follow-up examination after completed treatment for malignant neoplasm: Secondary | ICD-10-CM | POA: Diagnosis not present

## 2023-02-16 DIAGNOSIS — Z85828 Personal history of other malignant neoplasm of skin: Secondary | ICD-10-CM | POA: Diagnosis not present

## 2023-02-16 DIAGNOSIS — X32XXXD Exposure to sunlight, subsequent encounter: Secondary | ICD-10-CM | POA: Diagnosis not present

## 2023-02-16 DIAGNOSIS — L570 Actinic keratosis: Secondary | ICD-10-CM | POA: Diagnosis not present

## 2023-02-19 IMAGING — CT CT ABD-PELV W/ CM
2 of 6 series · 16 of 46 positions shown, 18 images · IV contrast (Omnipaque or Isovue)
Comparison: None.

CLINICAL DATA: Abdominal pain

EXAM:
CT ABDOMEN AND PELVIS WITH CONTRAST
TECHNIQUE: Multidetector CT imaging of the abdomen and pelvis was performed
using the standard protocol following bolus administration of
intravenous contrast.
CONTRAST:  100mL OMNIPAQUE IOHEXOL 300 MG/ML  SOLN

[Series 2: axial st · axial · 0.92mm/px · z∈[+508,+983]mm · 13 of 107 slices shown, 15 images]
[im 6/107  soft-tissue]
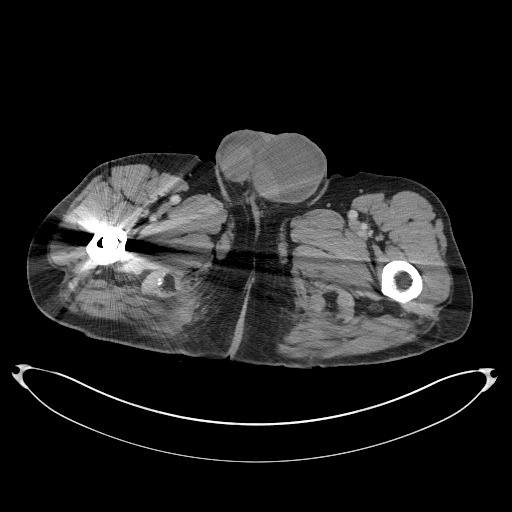
[im 6/107  bone]
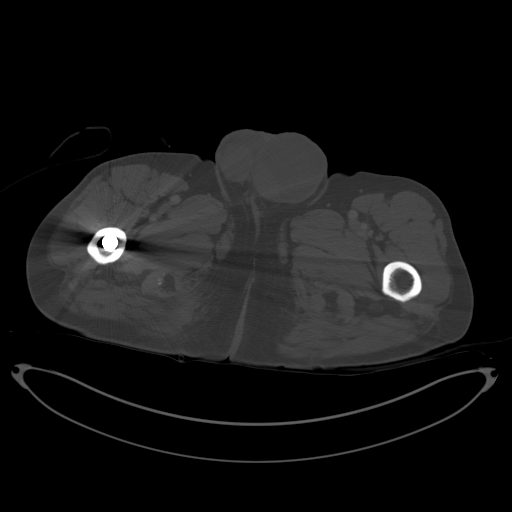
[im 12/107  soft-tissue]
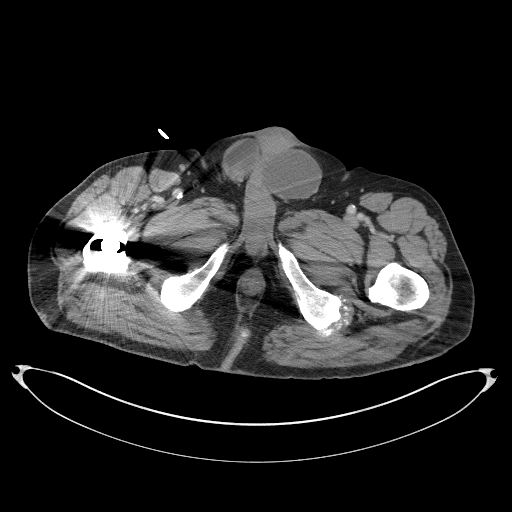
[im 24/107  soft-tissue]
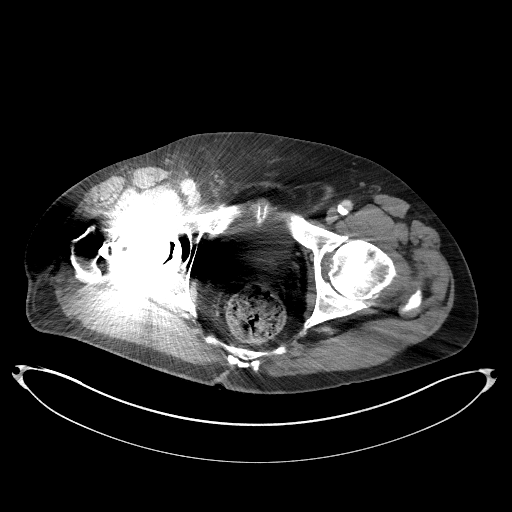
[im 30/107  soft-tissue]
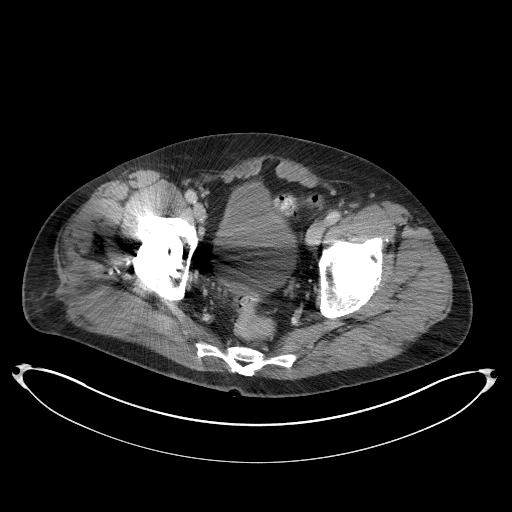
[im 36/107  soft-tissue]
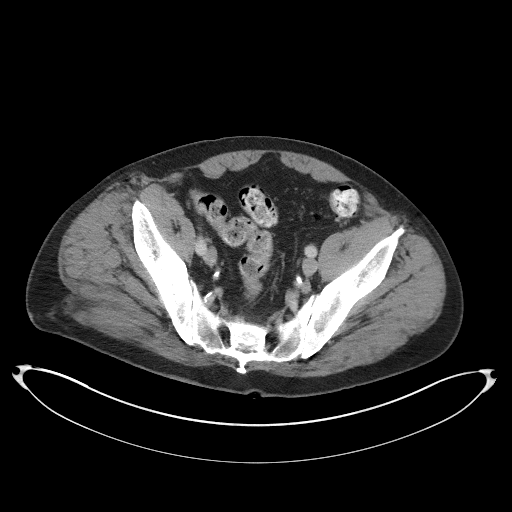
[im 48/107  soft-tissue]
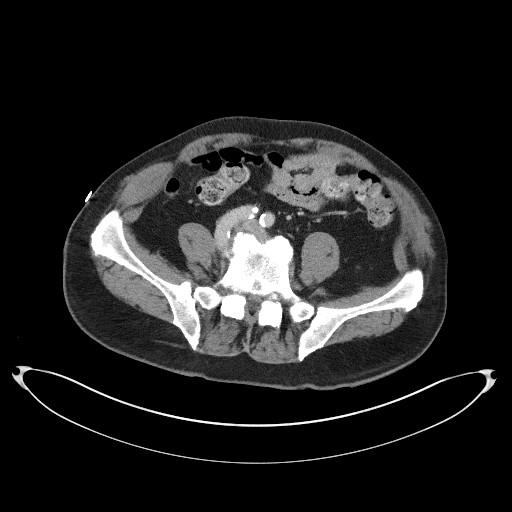
[im 54/107  soft-tissue]
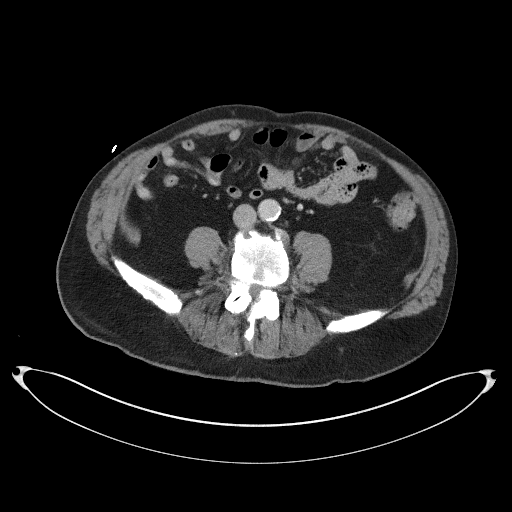
[im 59/107  soft-tissue]
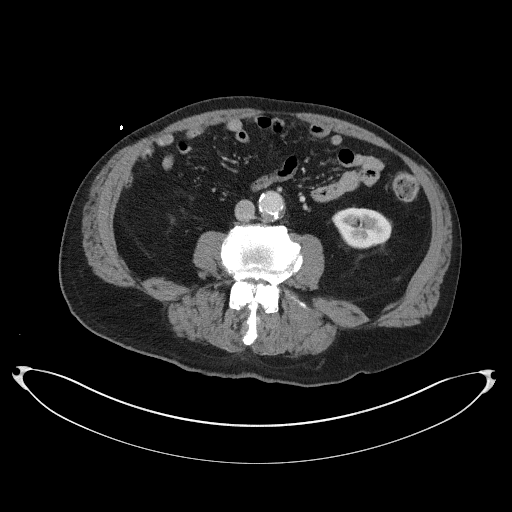
[im 71/107  soft-tissue]
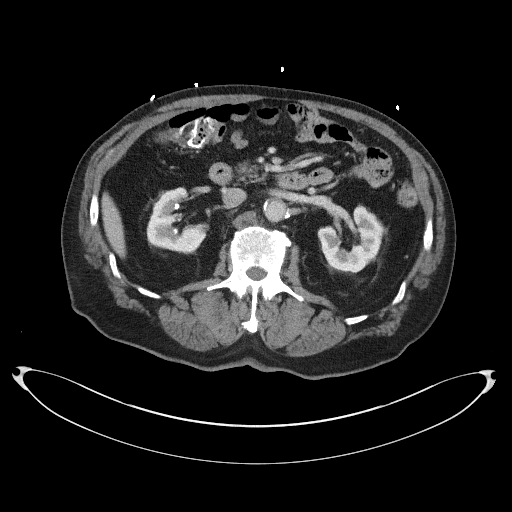
[im 71/107  bone]
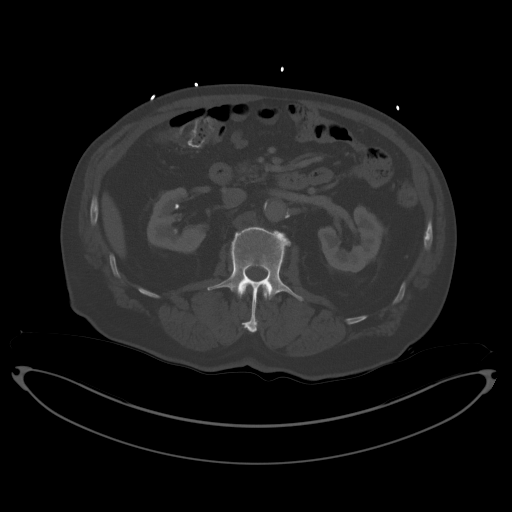
[im 77/107  soft-tissue]
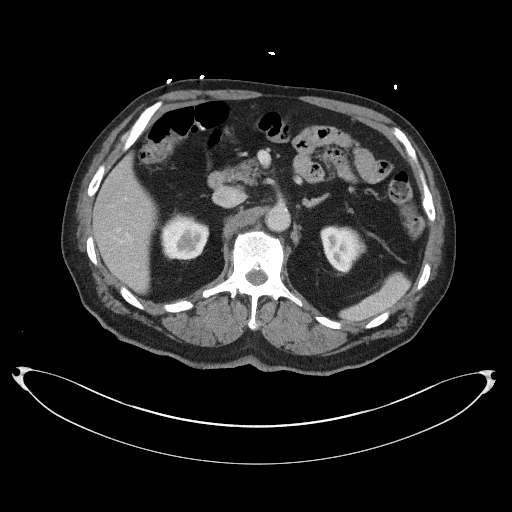
[im 83/107  soft-tissue]
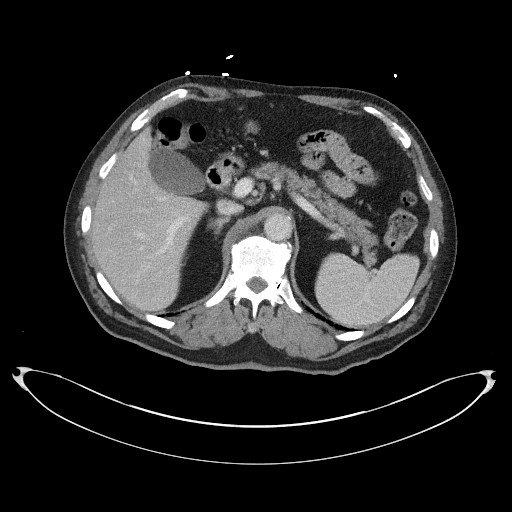
[im 95/107  soft-tissue]
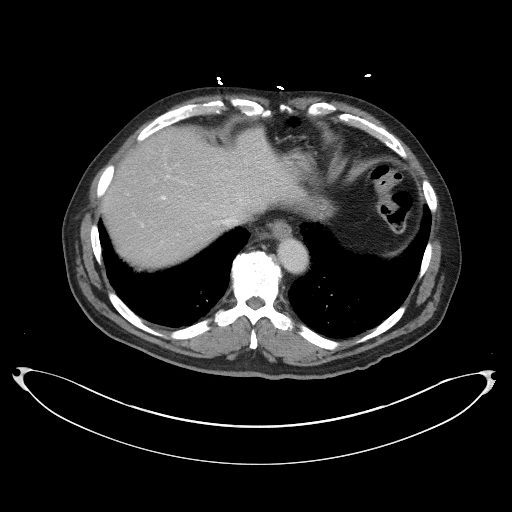
[im 101/107  soft-tissue]
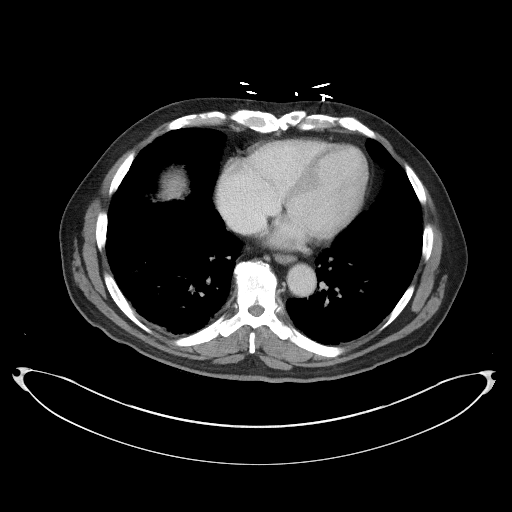

[Series 5: coronal st · coronal · 0.79mm/px · 3 of 113 slices shown]
[im 38/113  soft-tissue]
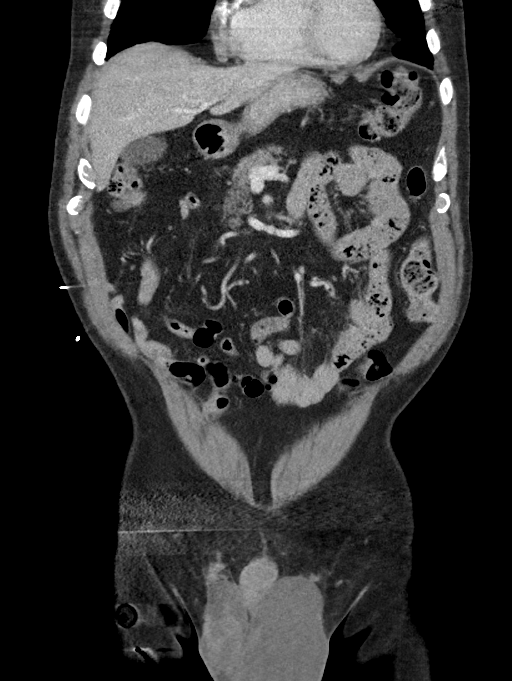
[im 50/113  soft-tissue]
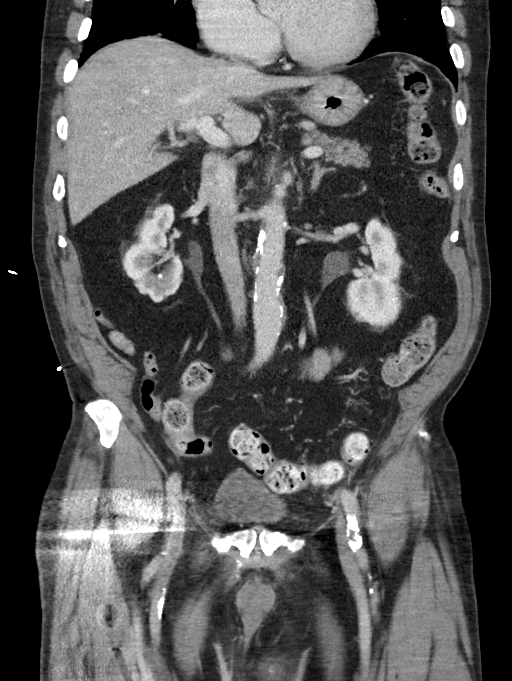
[im 63/113  soft-tissue]
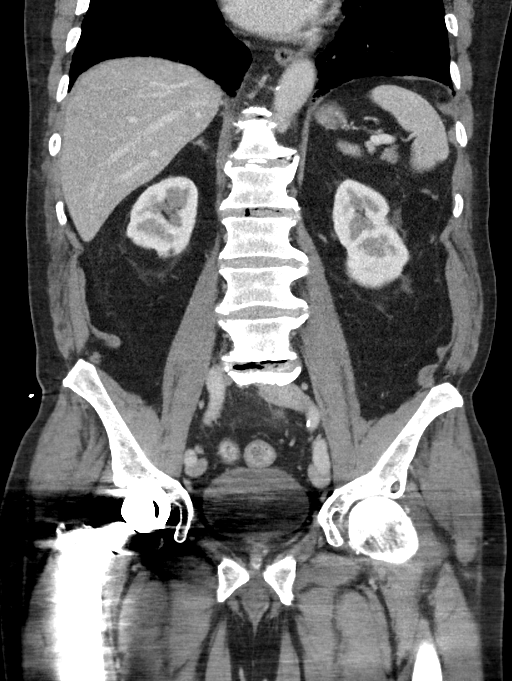

[16 of 46 positions shown; findings below may reference images not displayed]

FINDINGS: Lower chest: Bibasilar scarring/atelectasis.

Hepatobiliary: Too small to characterize low-attenuation lesion of
the superior left hepatic lobe. Gallbladder unremarkable. No biliary
dilatation.

Pancreas: Unremarkable.

Spleen: Unremarkable.

Adrenals/Urinary Tract: Adrenals are unremarkable. There are
bilateral nonobstructing calculi measuring up to 5 mm the right
upper pole. Distal ureters are obscured. Bladder is mostly obscured.

Stomach/Bowel: Stomach is within normal limits. Bowel is normal in
caliber. Normal appendix. Mild distal colonic diverticulosis.

Vascular/Lymphatic: Extensive aortoiliac atherosclerosis with mixed
plaque. No enlarged lymph nodes identified.

Reproductive: Prostate is unremarkable.  Bilateral hydroceles.

Other: No ascites.  No acute abnormality of the abdominal wall.

Musculoskeletal: Degenerative changes of the included spine. No
acute osseous abnormality. Partially imaged right total hip
arthroplasty with associated streak artifact.
IMPRESSION: No acute abnormality.

Bilateral nonobstructing renal calculi.

Colonic diverticulosis.

## 2023-02-25 DIAGNOSIS — L720 Epidermal cyst: Secondary | ICD-10-CM | POA: Diagnosis not present

## 2023-02-26 ENCOUNTER — Ambulatory Visit: Payer: Medicare Other | Admitting: Internal Medicine

## 2023-03-23 DIAGNOSIS — M25511 Pain in right shoulder: Secondary | ICD-10-CM | POA: Diagnosis not present

## 2023-03-23 DIAGNOSIS — M791 Myalgia, unspecified site: Secondary | ICD-10-CM | POA: Diagnosis not present

## 2023-03-25 DIAGNOSIS — M25511 Pain in right shoulder: Secondary | ICD-10-CM | POA: Diagnosis not present

## 2023-03-25 DIAGNOSIS — I951 Orthostatic hypotension: Secondary | ICD-10-CM | POA: Diagnosis not present

## 2023-03-25 DIAGNOSIS — K5900 Constipation, unspecified: Secondary | ICD-10-CM | POA: Diagnosis not present

## 2023-04-01 ENCOUNTER — Encounter: Payer: Self-pay | Admitting: Orthopaedic Surgery

## 2023-04-01 ENCOUNTER — Other Ambulatory Visit (INDEPENDENT_AMBULATORY_CARE_PROVIDER_SITE_OTHER): Payer: Medicare Other

## 2023-04-01 ENCOUNTER — Ambulatory Visit (INDEPENDENT_AMBULATORY_CARE_PROVIDER_SITE_OTHER): Payer: Medicare Other | Admitting: Orthopaedic Surgery

## 2023-04-01 VITALS — Ht 72.0 in | Wt 193.0 lb

## 2023-04-01 DIAGNOSIS — M25511 Pain in right shoulder: Secondary | ICD-10-CM

## 2023-04-01 DIAGNOSIS — M7541 Impingement syndrome of right shoulder: Secondary | ICD-10-CM

## 2023-04-02 DIAGNOSIS — M7541 Impingement syndrome of right shoulder: Secondary | ICD-10-CM

## 2023-04-02 MED ORDER — LIDOCAINE HCL 1 % IJ SOLN
0.5000 mL | INTRAMUSCULAR | Status: AC | PRN
  Administered 2023-04-02: .5 mL

## 2023-04-02 MED ORDER — BUPIVACAINE HCL 0.25 % IJ SOLN
4.0000 mL | INTRAMUSCULAR | Status: AC | PRN
  Administered 2023-04-02: 4 mL via INTRA_ARTICULAR

## 2023-04-02 MED ORDER — METHYLPREDNISOLONE ACETATE 40 MG/ML IJ SUSP
40.0000 mg | INTRAMUSCULAR | Status: AC | PRN
  Administered 2023-04-02: 40 mg via INTRA_ARTICULAR

## 2023-04-02 NOTE — Progress Notes (Signed)
Office Visit Note   Patient: Jimmy Knox           Date of Birth: 1932/11/16           MRN: [id number] Visit Date: 04/01/2023              Requested by: Asencion Noble, MD [address] [place],  [place] [address] PCP: Asencion Noble, MD   Assessment & Plan: Visit Diagnoses:  1. Acute pain of right shoulder   2. Impingement syndrome of right shoulder     Plan: Patient tolerated the injection well improvement in pain he will return if he has ongoing problems.  He likely had a rotator cuff tear for significant number of years with calcification present.  We discussed activities to avoid the subglottis shoulder symptoms help with the injection will take care of it.  Follow-Up Instructions: No follow-ups on file.   Orders:  Orders Placed This Encounter  Procedures   Large Joint Inj: R subacromial bursa   XR Shoulder Right   No orders of the defined types were placed in this encounter.     Procedures: Large Joint Inj: R subacromial bursa on 04/02/2023 9:19 AM Indications: pain Details: 22 G 1.5 in needle, lateral approach  Arthrogram: No  Medications: 4 mL bupivacaine 0.25 %; 40 mg methylPREDNISolone acetate 40 MG/ML; 0.5 mL lidocaine 1 % Outcome: tolerated well, no immediate complications Procedure, treatment alternatives, risks and benefits explained, specific risks discussed. Consent was given by the patient. Immediately prior to procedure a time out was called to verify the correct patient, procedure, equipment, support staff and site/side marked as required. Patient was prepped and draped in the usual sterile fashion.       Clinical Data: No additional findings.   Subjective: Chief Complaint  Patient presents with   Right Shoulder - Pain    HPI 89 year old male with some problems with his shoulder in the past trying to crank a gas powered leaf blower which was difficult to get started he has significant pain in his shoulder since that time.  He is given an  IM steroid injection baclofen felt some better scratchy gotten worse.  He is working in the yard burning trash cutting limbs had increased pain.  He did his arm over his head with pain.  Tingling in his hand no neck pain.  No chills or fever.  Review of Systems 14 point systems updated otherwise normal.   Objective: Vital Signs: Ht 6' (1.829 m)   Wt 193 lb (87.5 kg)   BMI 26.18 kg/m   Physical Exam Constitutional:      Appearance: He is well-developed.  HENT:     Head: Normocephalic and atraumatic.     Right Ear: External ear normal.     Left Ear: External ear normal.  Eyes:     Pupils: Pupils are equal, round, and reactive to light.  Neck:     Thyroid: No thyromegaly.     Trachea: No tracheal deviation.  Cardiovascular:     Rate and Rhythm: Normal rate.  Pulmonary:     Effort: Pulmonary effort is normal.     Breath sounds: No wheezing.  Abdominal:     General: Bowel sounds are normal.     Palpations: Abdomen is soft.  Musculoskeletal:     Cervical back: Neck supple.  Skin:    General: Skin is warm and dry.     Capillary Refill: Capillary refill takes less than 2 seconds.  Neurological:  Mental Status: He is alert and oriented to person, place, and time.  Psychiatric:        Behavior: Behavior normal.        Thought Content: Thought content normal.        Judgment: Judgment normal.     Ortho Exam Long head of the biceps intact positive impingement.  Some weakness to supraspinatus testing.  Limitation internal rotation hand only posterior axillary line.  Some tenderness of the acromioclavicular joint.  Specialty Comments:  No specialty comments available.  Imaging: XR Shoulder Right  Result Date: 04/02/2023 Three-view x-rays right shoulder demonstrate some calcification in the subacromial space likely involving supraspinatus tendon.  Some acromioclavicular degenerative changes no subluxation or significant spurring of the glenohumeral joint. Impression: Calcific  tendinopathy likely supraspinatus tendon right shoulder.  No acute changes noted.    PMFS History: Patient Active Problem List   Diagnosis Date Noted   Gastric erosions 01/26/2023   Early satiety 10/13/2022   Melena 10/13/2022   Bloating [id number]   Helicobacter pylori gastritis 11/26/2021   Weight loss 07/25/2021   Spinal stenosis of lumbar region 04/25/2021   Constipation 04/18/2021   Abdominal pressure 04/18/2021   Nausea without vomiting 03/19/2021   Leg weakness, bilateral 06/28/2020   IPF (idiopathic pulmonary fibrosis) 05/16/2020   Bilateral primary osteoarthritis of knee 01/19/2019   Angina pectoris 07/15/2018   Dyspnea on exertion 06/15/2018   Primary osteoarthritis of right hip 07/29/2016   Hypotension 07/29/2016   Chronic combined systolic and diastolic CHF (congestive heart failure) 07/29/2016   S/P total hip arthroplasty 07/29/2016   Post-operative state    Postprocedural hypotension    Cardiomyopathy, ischemic 10/12/2013   Carotid artery stenosis 10/12/2013   Cerebrovascular disease 02/14/2013   Arteriosclerotic cardiovascular disease (ASCVD)    Essential hypertension    Hyperlipidemia    Fasting hyperglycemia    Past Medical History:  Diagnosis Date   Anxiety    Arthritis    "right hip" (07/15/2018)   Carotid artery disease    a. <50% bilaterally 05/2016.   Chronic combined systolic and diastolic CHF (congestive heart failure)    Complication of anesthesia    "didn't wake up well after OR on 07/30/2016; took 2-3 to hold me down"   Coronary artery disease    a. prior MI/stent around 2000. b. Nuc 09/2015 -> abnl, mgmd medically.   Facial neuralgia    History of blood transfusion 07/30/2016   "after the operation"   Hyperlipidemia    Hypertension    Ischemic cardiomyopathy    Melanoma of nose    "inside on the right side"   Myocardial infarction ~ 2000   Pneumonia 1935- 1970s ,"several times"   [facility] spotted fever 1942   Skin cancer    "on  my back; arms; froze them off"   Type 2 diabetes, diet controlled     Family History  Problem Relation Age of Onset   Diabetes Mellitus II Brother        x2 brothers   Alzheimer's disease Mother    Diabetes Brother    Diabetes Maternal Uncle     Past Surgical History:  Procedure Laterality Date   APPENDECTOMY     BACK SURGERY     BIOPSY  10/16/2021   Procedure: BIOPSY;  Surgeon: Rogene Houston, MD;  [facility];  Service: Endoscopy;;   BIOPSY  10/16/2022   Procedure: BIOPSY;  Surgeon: Harvel Quale, MD;  [facility];  Service:  Gastroenterology;;   CATARACT EXTRACTION, BILATERAL Bilateral    COLONOSCOPY N/A 09/28/2015   Procedure: COLONOSCOPY;  Surgeon: Rogene Houston, MD;  [facility];  Service: Endoscopy;  Laterality: N/A;  955   CORONARY ANGIOPLASTY WITH STENT PLACEMENT  2000   CORONARY CTO INTERVENTION  07/15/2018   CORONARY CTO INTERVENTION N/A 07/15/2018   Procedure: CORONARY CTO INTERVENTION;  Surgeon: Martinique, Peter M, MD;  Location: [facility] [facility];  Service: Cardiovascular;  Laterality: N/A;   CYSTOSCOPY W/ URETERAL STENT PLACEMENT     ESOPHAGOGASTRODUODENOSCOPY (EGD) WITH PROPOFOL N/A 10/16/2021   Procedure: ESOPHAGOGASTRODUODENOSCOPY (EGD) WITH PROPOFOL;  Surgeon: Rogene Houston, MD;  [facility];  Service: Endoscopy;  Laterality: N/A;  11:05   ESOPHAGOGASTRODUODENOSCOPY (EGD) WITH PROPOFOL N/A 10/16/2022   Procedure: ESOPHAGOGASTRODUODENOSCOPY (EGD) WITH PROPOFOL;  Surgeon: Harvel Quale, MD;  [facility];  Service: Gastroenterology;  Laterality: N/A;  1130 ASA 3   EYE SURGERY Right 2013   "got hit by a limb & knocked my eye out"   JOINT REPLACEMENT     LEFT HEART CATH AND CORONARY ANGIOGRAPHY N/A 06/15/2018   Procedure: LEFT HEART CATH AND CORONARY ANGIOGRAPHY;  Surgeon: Martinique, Peter M, MD;  Location: [place];  Service: Cardiovascular;  Laterality: N/A;   LUMBAR [place] Right    "inside my nose"   TONSILLECTOMY     TOTAL HIP ARTHROPLASTY Right 07/29/2016   Procedure: TOTAL HIP ARTHROPLASTY;  Surgeon: Garald Balding, MD;  Location: [place];  Service: Orthopedics;  Laterality: Right;   Social History   Occupational History   Occupation: Retired  Tobacco Use   Smoking status: Former    Packs/day: 1.00    Years: 27.00    Additional pack years: 0.00    Total pack years: 27.00    Types: Cigarettes    Start date: 12/30/1943    Quit date: 12/29/1970    Years since quitting: 52.2    Passive exposure: Past   Smokeless tobacco: Never   Tobacco comments:    "stopped smoking in 1970s when I got pneumonia"  Vaping Use   Vaping Use: Never used  Substance and Sexual Activity   Alcohol use: Not Currently    Comment: beer every once in awhile recommended by doctor for kidneys    Drug use: Never   Sexual activity: Not Currently

## 2023-04-07 ENCOUNTER — Other Ambulatory Visit (INDEPENDENT_AMBULATORY_CARE_PROVIDER_SITE_OTHER): Payer: Self-pay | Admitting: Gastroenterology

## 2023-04-27 DIAGNOSIS — H524 Presbyopia: Secondary | ICD-10-CM | POA: Diagnosis not present

## 2023-04-27 DIAGNOSIS — Z961 Presence of intraocular lens: Secondary | ICD-10-CM | POA: Diagnosis not present

## 2023-04-28 DIAGNOSIS — E1010 Type 1 diabetes mellitus with ketoacidosis without coma: Secondary | ICD-10-CM | POA: Diagnosis not present

## 2023-04-28 DIAGNOSIS — E119 Type 2 diabetes mellitus without complications: Secondary | ICD-10-CM | POA: Diagnosis not present

## 2023-04-28 DIAGNOSIS — E785 Hyperlipidemia, unspecified: Secondary | ICD-10-CM | POA: Diagnosis not present

## 2023-05-04 ENCOUNTER — Telehealth: Payer: Self-pay | Admitting: Cardiovascular Disease

## 2023-05-04 NOTE — Telephone Encounter (Signed)
Patient's daughter is following up. She states that the she is unable to send a message to Dr. Flora Lipps on MyChart, but she would like a call back to discuss.

## 2023-05-04 NOTE — Telephone Encounter (Signed)
Needs follow-up with me this week or next. If I have no spots, DOD or APP. If symptoms worsen, will need to go to ER.  Jimmy Knox T. Flora Lipps, MD, [facility]  Gave the information above. Appt made with Dr Jenene Slicker at the [facility] office. Is aware that if s/s worsen, they need to go to the ER.  Pt weighs 190lb

## 2023-05-04 NOTE — Telephone Encounter (Signed)
Pt's daughter states that pt has been c/o being dizzy for the last few months. Over the last week he has been SOB. Does not weight daily. Last weight was done 2 weeks ago and noted to be 196 lbs. Pt does not c/o being dizzy as night just in the morning and afternoon.Taking Midodrine as prescribed.  Pt c/o SOB on and off. Daughter will see if pt can weigh today and she will send a MyChart msg.

## 2023-05-04 NOTE — Telephone Encounter (Signed)
Pt c/o Shortness Of Breath: STAT if SOB developed within the last 24 hours or pt is noticeably SOB on the phone  1. Are you currently SOB (can you hear that pt is SOB on the phone)?  Daughter is not currently with the patient  2. How long have you been experiencing SOB?  Since last Thursday   3. Are you SOB when sitting or when up moving around?  Both   4. Are you currently experiencing any other symptoms?  Patient has been dizzy recently-- worsening over the past 2 months.

## 2023-05-06 ENCOUNTER — Ambulatory Visit: Payer: Medicare Other | Attending: Internal Medicine | Admitting: Internal Medicine

## 2023-05-06 ENCOUNTER — Other Ambulatory Visit (INDEPENDENT_AMBULATORY_CARE_PROVIDER_SITE_OTHER): Payer: Medicare Other

## 2023-05-06 ENCOUNTER — Other Ambulatory Visit: Payer: Self-pay | Admitting: Internal Medicine

## 2023-05-06 ENCOUNTER — Encounter: Payer: Self-pay | Admitting: Internal Medicine

## 2023-05-06 VITALS — BP 100/56 | HR 75 | Ht 72.0 in | Wt 188.4 lb

## 2023-05-06 DIAGNOSIS — R0609 Other forms of dyspnea: Secondary | ICD-10-CM | POA: Diagnosis not present

## 2023-05-06 DIAGNOSIS — R42 Dizziness and giddiness: Secondary | ICD-10-CM

## 2023-05-06 DIAGNOSIS — I10 Essential (primary) hypertension: Secondary | ICD-10-CM | POA: Diagnosis not present

## 2023-05-06 MED ORDER — MIDODRINE HCL 5 MG PO TABS: 7.5000 mg | ORAL_TABLET | Freq: Two times a day (BID) | ORAL | 6 refills | Status: DC

## 2023-05-06 MED ORDER — FUROSEMIDE 20 MG PO TABS: 20.0000 mg | ORAL_TABLET | Freq: Two times a day (BID) | ORAL | 6 refills | Status: DC

## 2023-05-06 NOTE — Patient Instructions (Addendum)
Medication Instructions:  Your physician has recommended you make the following change in your medication:  Midodrine 7.5 mg twice daily Furosemide 20 mg twice daily Continue other medications the same  Labwork: BNP today at [facility] ([address] [place]) Non-fasting  Testing/Procedures: Your physician has requested that you have an echocardiogram. Echocardiography is a painless test that uses sound waves to create images of your heart. It provides your doctor with information about the size and shape of your heart and how well your heart's chambers and valves are working. This procedure takes approximately one hour. There are no restrictions for this procedure. Please do NOT wear cologne, perfume, aftershave, or lotions (deodorant is allowed). Please arrive 15 minutes prior to your appointment time. 14 Day ZIO AT-instructions given in packet  Follow-Up: Your physician recommends that you schedule a follow-up appointment in: 6 weeks  Any Other Special Instructions Will Be Listed Below (If Applicable).  If you need a refill on your cardiac medications before your next appointment, please call your pharmacy.

## 2023-05-07 DIAGNOSIS — R42 Dizziness and giddiness: Secondary | ICD-10-CM | POA: Diagnosis not present

## 2023-05-07 LAB — BRAIN NATRIURETIC PEPTIDE: BNP: 140.8 pg/mL — ABNORMAL HIGH (ref 0.0–100.0)

## 2023-05-08 ENCOUNTER — Encounter: Payer: Self-pay | Admitting: Internal Medicine

## 2023-05-08 ENCOUNTER — Telehealth: Payer: Self-pay | Admitting: Internal Medicine

## 2023-05-08 NOTE — Telephone Encounter (Signed)
Daughter Misty Stanley wanted to know how she would know if the monitor wasn't working. Advised she would see lights flashing if the device wasn't monitoring. Verbalized understanding.

## 2023-05-08 NOTE — Telephone Encounter (Signed)
New Message:     Patient's daughter called. She would like for [facility] to call her please. She have some questions about the monitor.

## 2023-05-11 DIAGNOSIS — R42 Dizziness and giddiness: Secondary | ICD-10-CM | POA: Insufficient documentation

## 2023-05-11 NOTE — Progress Notes (Signed)
Cardiology Office Note  Date: 05/11/2023   ID: Jimmy Knox, DOB [birth year], MRN [id number]  PCP:  Carylon Perches, MD  Cardiologist:  Marjo Bicker, MD Electrophysiologist:  None   Reason for Office Visit: Follow-up CAD   History of Present Illness: Jimmy Knox is a 89 y.o. male known to have CAD s/p RCA PCI in 2019 with residual Lcx CTO that was unable to be intervened due to technical difficulties and 50% mid LAD lesion with ICM LVEF 45 to 50%, DM 2, HLD, is here for follow-up visit.  Patient has DOE and dizziness (when he bends over and picks up something and also with exertion) for the last few months. No angina. No palpitations or syncope.  Compliant with medications and has no side effects.  Past Medical History:  Diagnosis Date   Anxiety    Arthritis    "right hip" (07/15/2018)   Carotid artery disease (HCC)    a. <50% bilaterally 05/2016.   Chronic combined systolic and diastolic CHF (congestive heart failure) (HCC)    Complication of anesthesia    "didn't wake up well after OR on 07/30/2016; took 2-3 to hold me down"   Coronary artery disease    a. prior MI/stent around 2000. b. Nuc 09/2015 -> abnl, mgmd medically.   Facial neuralgia    History of blood transfusion 07/30/2016   "after the operation"   Hyperlipidemia    Hypertension    Ischemic cardiomyopathy    Melanoma of nose (HCC)    "inside on the right side"   Myocardial infarction (HCC) ~ 2000   Pneumonia 1935- 1970s ,"several times"   [facility] spotted fever 1942   Skin cancer    "on my back; arms; froze them off"   Type 2 diabetes, diet controlled (HCC)     Past Surgical History:  Procedure Laterality Date   APPENDECTOMY     BACK SURGERY     BIOPSY  10/16/2021   Procedure: BIOPSY;  Surgeon: Malissa Hippo, MD;  [facility];  Service: Endoscopy;;   BIOPSY  10/16/2022   Procedure: BIOPSY;  Surgeon: Dolores Frame, MD;  [facility];  Service:  Gastroenterology;;   CATARACT EXTRACTION, BILATERAL Bilateral    COLONOSCOPY N/A 09/28/2015   Procedure: COLONOSCOPY;  Surgeon: Malissa Hippo, MD;  [facility];  Service: Endoscopy;  Laterality: N/A;  955   CORONARY ANGIOPLASTY WITH STENT PLACEMENT  2000   CORONARY CTO INTERVENTION  07/15/2018   CORONARY CTO INTERVENTION N/A 07/15/2018   Procedure: CORONARY CTO INTERVENTION;  Surgeon: Swaziland, Peter M, MD;  Location: [facility] INVASIVE CV LAB;  Service: Cardiovascular;  Laterality: N/A;   CYSTOSCOPY W/ URETERAL STENT PLACEMENT     ESOPHAGOGASTRODUODENOSCOPY (EGD) WITH PROPOFOL N/A 10/16/2021   Procedure: ESOPHAGOGASTRODUODENOSCOPY (EGD) WITH PROPOFOL;  Surgeon: Malissa Hippo, MD;  [facility];  Service: Endoscopy;  Laterality: N/A;  11:05   ESOPHAGOGASTRODUODENOSCOPY (EGD) WITH PROPOFOL N/A 10/16/2022   Procedure: ESOPHAGOGASTRODUODENOSCOPY (EGD) WITH PROPOFOL;  Surgeon: Dolores Frame, MD;  [facility];  Service: Gastroenterology;  Laterality: N/A;  1130 ASA 3   EYE SURGERY Right 2013   "got hit by a limb & knocked my eye out"   JOINT REPLACEMENT     LEFT HEART CATH AND CORONARY ANGIOGRAPHY N/A 06/15/2018   Procedure: LEFT HEART CATH AND CORONARY ANGIOGRAPHY;  Surgeon: Swaziland, Peter M, MD;  Location: [facility] INVASIVE CV LAB;  Service: Cardiovascular;  Laterality: N/A;   LUMBAR DISC SURGERY  1992   MELANOMA EXCISION Right    "inside my nose"   TONSILLECTOMY     TOTAL HIP ARTHROPLASTY Right 07/29/2016   Procedure: TOTAL HIP ARTHROPLASTY;  Surgeon: Valeria Batman, MD;  Location: [facility];  Service: Orthopedics;  Laterality: Right;    Current Outpatient Medications  Medication Sig Dispense Refill   aspirin EC 81 MG tablet Take 1 tablet (81 mg total) by mouth in the morning. 30 tablet 11   atorvastatin (LIPITOR) 10 MG tablet Take 1 tablet (10 mg total) by mouth daily. 90 tablet 3   bismuth subsalicylate (PEPTO BISMOL) 262 MG chewable tablet Chew 262-524 mg  by mouth as needed for indigestion.     gabapentin (NEURONTIN) 300 MG capsule Take 300 mg by mouth 2 (two) times daily.     nitroGLYCERIN (NITROSTAT) 0.4 MG SL tablet Place 1 tablet (0.4 mg total) under the tongue every 5 (five) minutes as needed for chest pain. 25 tablet 2   polyethylene glycol powder (GLYCOLAX/MIRALAX) 17 GM/SCOOP powder Take 8.5 g by mouth daily. (Patient taking differently: Take 17 g by mouth daily. 1-2 times per day.) 255 g 0   zolpidem (AMBIEN) 10 MG tablet Take 10 mg by mouth as needed.     furosemide (LASIX) 20 MG tablet Take 1 tablet (20 mg total) by mouth 2 (two) times daily. 60 tablet 6   midodrine (PROAMATINE) 5 MG tablet Take 1.5 tablets (7.5 mg total) by mouth 2 (two) times daily with a meal. 90 tablet 6   No current facility-administered medications for this visit.   Allergies:  Patient has no known allergies.   Social History: The patient  reports that he quit smoking about 52 years ago. His smoking use included cigarettes. He started smoking about 79 years ago. He has a 27.00 pack-year smoking history. He has been exposed to tobacco smoke. He has never used smokeless tobacco. He reports that he does not currently use alcohol. He reports that he does not use drugs.   Family History: The patient's family history includes Alzheimer's disease in his mother; Diabetes in his brother and maternal uncle; Diabetes Mellitus II in his brother.   ROS:  Please see the history of present illness. Otherwise, complete review of systems is positive for none  All other systems are reviewed and negative.   Physical Exam: VS:  BP (!) 100/56   Pulse 75   Ht 6' (1.829 m)   Wt 188 lb 6.4 oz (85.5 kg)   SpO2 95%   BMI 25.55 kg/m , BMI Body mass index is 25.55 kg/m.  Wt Readings from Last 3 Encounters:  05/06/23 188 lb 6.4 oz (85.5 kg)  04/01/23 193 lb (87.5 kg)  01/26/23 194 lb 14.4 oz (88.4 kg)    General: Patient appears comfortable at rest. HEENT: Conjunctiva and lids  normal, oropharynx clear with moist mucosa. Neck: Supple, no elevated JVP or carotid bruits, no thyromegaly. Lungs: Clear to auscultation, nonlabored breathing at rest. Cardiac: Regular rate and rhythm, no S3 or significant systolic murmur, no pericardial rub. Abdomen: Soft, nontender, no hepatomegaly, bowel sounds present, no guarding or rebound. Extremities: No pitting edema, distal pulses 2+. Skin: Warm and dry. Musculoskeletal: No kyphosis. Neuropsychiatric: Alert and oriented x3, affect grossly appropriate.  Recent Labwork: 10/15/2022: BUN 24; Creatinine, Ser 1.47; Potassium 4.8; Sodium 139 05/06/2023: BNP 140.8  No results found for: "CHOL", "TRIG", "HDL", "CHOLHDL", "VLDL", "LDLCALC", "LDLDIRECT"   Assessment and Plan:  Patient is a 89 year old M known to have CAD s/p RCA PCI in 2019 with residual Lcx CTO that was unable to be intervened due to technical difficulties and 50% mid LAD lesion with ICM LVEF 45 to 50%, DM 2, HLD, is here for follow-up visit.  # Acute on chronic systolic heart failure exacerbation -Patient has DOE for the last few months. BNP is mildly elevated at 140. will update 2D echocardiogram and start p.o. Lasix 20 mg twice daily. Due to soft pressures, he will benefit from midodrine 7.5 mg twice daily.  # Dizziness: Obtain 2-week event monitor and increase midodrine 7.5 mg twice daily.  # CAD s/p RCA PCI in 2019 with residual Lcx CTO that was unable to be intervened due to technical difficulties and 50% mid LAD lesion with ICM LVEF 45 to 50%, currently angina free -Continue aspirin 81 mg once daily -Continue atorvastatin 10 mg nightly -ER precautions for chest pain  # HLD: Continue atorvastatin 10 mg nightly, goal LDL less than 70.  I have spent a total of 30 minutes with patient reviewing chart, EKGs, labs and examining patient as well as establishing an assessment and plan that was discussed with the patient.  > 50% of time was spent in direct patient care.     Medication Adjustments/Labs and Tests Ordered: Current medicines are reviewed at length with the patient today.  Concerns regarding medicines are outlined above.   Tests Ordered: Orders Placed This Encounter  Procedures   Brain natriuretic peptide   EKG 12-Lead   ECHOCARDIOGRAM COMPLETE    Medication Changes: Meds ordered this encounter  Medications   midodrine (PROAMATINE) 5 MG tablet    Sig: Take 1.5 tablets (7.5 mg total) by mouth 2 (two) times daily with a meal.    Dispense:  90 tablet    Refill:  6   furosemide (LASIX) 20 MG tablet    Sig: Take 1 tablet (20 mg total) by mouth 2 (two) times daily.    Dispense:  60 tablet    Refill:  6    Disposition:  Follow up  6 weeks  Signed Annisten Manchester Verne Spurr, MD, 05/11/2023 10:16 AM    [facility] [address] [place], [place], [place] [address]

## 2023-05-27 ENCOUNTER — Ambulatory Visit (INDEPENDENT_AMBULATORY_CARE_PROVIDER_SITE_OTHER): Payer: Medicare Other | Admitting: Orthopaedic Surgery

## 2023-05-27 ENCOUNTER — Other Ambulatory Visit: Payer: Self-pay

## 2023-05-27 ENCOUNTER — Encounter: Payer: Self-pay | Admitting: Orthopaedic Surgery

## 2023-05-27 VITALS — Ht 72.0 in | Wt 188.0 lb

## 2023-05-27 DIAGNOSIS — M25512 Pain in left shoulder: Secondary | ICD-10-CM

## 2023-05-27 DIAGNOSIS — M7542 Impingement syndrome of left shoulder: Secondary | ICD-10-CM | POA: Diagnosis not present

## 2023-05-27 MED ORDER — LIDOCAINE HCL 1 % IJ SOLN
0.5000 mL | INTRAMUSCULAR | Status: AC | PRN
  Administered 2023-05-27: .5 mL

## 2023-05-27 MED ORDER — METHYLPREDNISOLONE ACETATE 40 MG/ML IJ SUSP
40.0000 mg | INTRAMUSCULAR | Status: AC | PRN
  Administered 2023-05-27: 40 mg via INTRA_ARTICULAR

## 2023-05-27 MED ORDER — BUPIVACAINE HCL 0.25 % IJ SOLN
4.0000 mL | INTRAMUSCULAR | Status: AC | PRN
  Administered 2023-05-27: 4 mL via INTRA_ARTICULAR

## 2023-05-27 NOTE — Progress Notes (Signed)
Office Visit Note   Patient: Jimmy Knox           Date of Birth: August 25, 1932           MRN: [id number] Visit Date: 05/27/2023              Requested by: Carylon Perches, MD [address] [place],  [place] [address] PCP: Carylon Perches, MD   Assessment & Plan: Visit Diagnoses:  1. Acute pain of left shoulder   2. Impingement syndrome of left shoulder     Plan: Left shoulder injection performed which she tolerated well.  He will follow-up if he has persistent problems.  Follow-Up Instructions: No follow-ups on file.   Orders:  Orders Placed This Encounter  Procedures   Large Joint Inj   XR Shoulder Left   No orders of the defined types were placed in this encounter.     Procedures: Large Joint Inj: L subacromial bursa on 05/27/2023 11:00 AM Indications: pain Details: 22 G 1.5 in needle  Arthrogram: No  Medications: 4 mL bupivacaine 0.25 %; 40 mg methylPREDNISolone acetate 40 MG/ML; 0.5 mL lidocaine 1 % Outcome: tolerated well, no immediate complications Procedure, treatment alternatives, risks and benefits explained, specific risks discussed. Consent was given by the patient. Immediately prior to procedure a time out was called to verify the correct patient, procedure, equipment, support staff and site/side marked as required. Patient was prepped and draped in the usual sterile fashion.       Clinical Data: No additional findings.   Subjective: Chief Complaint  Patient presents with   Left Shoulder - Pain    HPI 89 year old male here with his daughter with left shoulder pain x 3 weeks.  Previous injection few months ago in his right shoulder gave him good relief is able to get his right hand up over his head easily.  Left is painful pain with abduction.  No history of specific injury.  He uses a cane in the right hand.  Additionally had previous CVA hypertension hip arthritis with total hip arthroplasty.  Idiopathic pulmonary fibrosis.  Review of Systems  all systems noncontributory to HPI.   Objective: Vital Signs: Ht 6' (1.829 m)   Wt 188 lb (85.3 kg)   BMI 25.50 kg/m   Physical Exam Constitutional:      Appearance: He is well-developed.  HENT:     Head: Normocephalic and atraumatic.     Right Ear: External ear normal.     Left Ear: External ear normal.     Ears:     Comments: Decreased hearing acuity Eyes:     Pupils: Pupils are equal, round, and reactive to light.  Neck:     Thyroid: No thyromegaly.     Trachea: No tracheal deviation.  Cardiovascular:     Rate and Rhythm: Normal rate.  Pulmonary:     Effort: Pulmonary effort is normal.     Breath sounds: No wheezing.  Abdominal:     General: Bowel sounds are normal.     Palpations: Abdomen is soft.  Musculoskeletal:     Cervical back: Neck supple.  Skin:    General: Skin is warm and dry.     Capillary Refill: Capillary refill takes less than 2 seconds.  Neurological:     Mental Status: He is alert and oriented to person, place, and time.  Psychiatric:        Behavior: Behavior normal.        Thought Content: Thought content  normal.        Judgment: Judgment normal.     Ortho Exam right arm he can reach over his head without discomfort.  Left arm positive impingement abduction only to 70 degrees.  Long of the biceps nontender.  Negative Hawkins positive Neer.  Good cervical range of motion.  Specialty Comments:  No specialty comments available.  Imaging: No results found.   PMFS History: Patient Active Problem List   Diagnosis Date Noted   Impingement syndrome of left shoulder 05/27/2023   Dizziness 05/11/2023   Gastric erosions 01/26/2023   Early satiety 10/13/2022   Melena 10/13/2022   Bloating 11/26/2021   Helicobacter pylori gastritis 11/26/2021   Weight loss 07/25/2021   Spinal stenosis of lumbar region 04/25/2021   Constipation 04/18/2021   Abdominal pressure 04/18/2021   Nausea without vomiting 03/19/2021   Leg weakness, bilateral  06/28/2020   IPF (idiopathic pulmonary fibrosis) (HCC) 05/16/2020   Bilateral primary osteoarthritis of knee 01/19/2019   Angina pectoris (HCC) 07/15/2018   Dyspnea on exertion 06/15/2018   Primary osteoarthritis of right hip 07/29/2016   Hypotension 07/29/2016   Acute on chronic combined systolic and diastolic CHF (congestive heart failure) (HCC) 07/29/2016   S/P total hip arthroplasty 07/29/2016   Post-operative state    Postprocedural hypotension    Cardiomyopathy, ischemic 10/12/2013   Carotid artery stenosis 10/12/2013   Cerebrovascular disease 02/14/2013   CAD (coronary artery disease)    Essential hypertension    Hyperlipidemia    Fasting hyperglycemia    Past Medical History:  Diagnosis Date   Anxiety    Arthritis    "right hip" (07/15/2018)   Carotid artery disease (HCC)    a. <50% bilaterally 05/2016.   Chronic combined systolic and diastolic CHF (congestive heart failure) (HCC)    Complication of anesthesia    "didn't wake up well after OR on 07/30/2016; took 2-3 to hold me down"   Coronary artery disease    a. prior MI/stent around 2000. b. Nuc 09/2015 -> abnl, mgmd medically.   Facial neuralgia    History of blood transfusion 07/30/2016   "after the operation"   Hyperlipidemia    Hypertension    Ischemic cardiomyopathy    Melanoma of nose (HCC)    "inside on the right side"   Myocardial infarction (HCC) ~ 2000   Pneumonia 1935- 1970s ,"several times"   [facility] spotted fever 1942   Skin cancer    "on my back; arms; froze them off"   Type 2 diabetes, diet controlled (HCC)     Family History  Problem Relation Age of Onset   Diabetes Mellitus II Brother        x2 brothers   Alzheimer's disease Mother    Diabetes Brother    Diabetes Maternal Uncle     Past Surgical History:  Procedure Laterality Date   APPENDECTOMY     BACK SURGERY     BIOPSY  10/16/2021   Procedure: BIOPSY;  Surgeon: Malissa Hippo, MD;  [facility];  Service:  Endoscopy;;   BIOPSY  10/16/2022   Procedure: BIOPSY;  Surgeon: Dolores Frame, MD;  [facility];  Service: Gastroenterology;;   CATARACT EXTRACTION, BILATERAL Bilateral    COLONOSCOPY N/A 09/28/2015   Procedure: COLONOSCOPY;  Surgeon: Malissa Hippo, MD;  [facility];  Service: Endoscopy;  Laterality: N/A;  955   CORONARY ANGIOPLASTY WITH STENT PLACEMENT  2000   CORONARY CTO INTERVENTION  07/15/2018  CORONARY CTO INTERVENTION N/A 07/15/2018   Procedure: CORONARY CTO INTERVENTION;  Surgeon: Swaziland, Peter M, MD;  Location: [facility] INVASIVE CV LAB;  Service: Cardiovascular;  Laterality: N/A;   CYSTOSCOPY W/ URETERAL STENT PLACEMENT     ESOPHAGOGASTRODUODENOSCOPY (EGD) WITH PROPOFOL N/A 10/16/2021   Procedure: ESOPHAGOGASTRODUODENOSCOPY (EGD) WITH PROPOFOL;  Surgeon: Malissa Hippo, MD;  [facility];  Service: Endoscopy;  Laterality: N/A;  11:05   ESOPHAGOGASTRODUODENOSCOPY (EGD) WITH PROPOFOL N/A 10/16/2022   Procedure: ESOPHAGOGASTRODUODENOSCOPY (EGD) WITH PROPOFOL;  Surgeon: Dolores Frame, MD;  [facility];  Service: Gastroenterology;  Laterality: N/A;  1130 ASA 3   EYE SURGERY Right 2013   "got hit by a limb & knocked my eye out"   JOINT REPLACEMENT     LEFT HEART CATH AND CORONARY ANGIOGRAPHY N/A 06/15/2018   Procedure: LEFT HEART CATH AND CORONARY ANGIOGRAPHY;  Surgeon: Swaziland, Peter M, MD;  Location: [facility] INVASIVE CV LAB;  Service: Cardiovascular;  Laterality: N/A;   LUMBAR DISC SURGERY  1992   MELANOMA EXCISION Right    "inside my nose"   TONSILLECTOMY     TOTAL HIP ARTHROPLASTY Right 07/29/2016   Procedure: TOTAL HIP ARTHROPLASTY;  Surgeon: Valeria Batman, MD;  Location: [facility];  Service: Orthopedics;  Laterality: Right;   Social History   Occupational History   Occupation: Retired  Tobacco Use   Smoking status: Former    Packs/day: 1.00    Years: 27.00    Additional pack years: 0.00    Total pack years: 27.00     Types: Cigarettes    Start date: 12/30/1943    Quit date: 12/29/1970    Years since quitting: 52.4    Passive exposure: Past   Smokeless tobacco: Never   Tobacco comments:    "stopped smoking in 1970s when I got pneumonia"  Vaping Use   Vaping Use: Never used  Substance and Sexual Activity   Alcohol use: Not Currently    Comment: beer every once in awhile recommended by doctor for kidneys    Drug use: Never   Sexual activity: Not Currently

## 2023-06-16 ENCOUNTER — Ambulatory Visit (HOSPITAL_COMMUNITY)
Admission: RE | Admit: 2023-06-16 | Discharge: 2023-06-16 | Disposition: A | Discharge status: Home / Self Care | Payer: Medicare Other | Source: Ambulatory Visit | Attending: Internal Medicine | Admitting: Internal Medicine

## 2023-06-16 DIAGNOSIS — I951 Orthostatic hypotension: Secondary | ICD-10-CM | POA: Diagnosis not present

## 2023-06-16 DIAGNOSIS — E118 Type 2 diabetes mellitus with unspecified complications: Secondary | ICD-10-CM | POA: Diagnosis not present

## 2023-06-16 DIAGNOSIS — I5022 Chronic systolic (congestive) heart failure: Secondary | ICD-10-CM | POA: Diagnosis not present

## 2023-06-16 DIAGNOSIS — R0609 Other forms of dyspnea: Secondary | ICD-10-CM | POA: Diagnosis not present

## 2023-06-16 DIAGNOSIS — I2510 Atherosclerotic heart disease of native coronary artery without angina pectoris: Secondary | ICD-10-CM | POA: Diagnosis not present

## 2023-06-16 DIAGNOSIS — I10 Essential (primary) hypertension: Secondary | ICD-10-CM | POA: Diagnosis not present

## 2023-06-16 DIAGNOSIS — M5186 Other intervertebral disc disorders, lumbar region: Secondary | ICD-10-CM | POA: Diagnosis not present

## 2023-06-16 LAB — ECHOCARDIOGRAM COMPLETE
Area-P 1/2: 1.66 cm2
Calc EF: 30.3 %
P 1/2 time: 524 msec
S' Lateral: 4.2 cm
Single Plane A2C EF: 26.9 %
Single Plane A4C EF: 29.5 %

## 2023-06-16 NOTE — Progress Notes (Signed)
*  PRELIMINARY RESULTS* Echocardiogram 2D Echocardiogram has been performed.  Stacey Drain 06/16/2023, 12:04 PM

## 2023-06-18 ENCOUNTER — Other Ambulatory Visit: Payer: Self-pay | Admitting: Internal Medicine

## 2023-06-18 ENCOUNTER — Telehealth: Payer: Self-pay | Admitting: Internal Medicine

## 2023-06-18 MED ORDER — METOPROLOL TARTRATE 25 MG PO TABS: 12.5000 mg | ORAL_TABLET | Freq: Two times a day (BID) | ORAL | 3 refills | Status: DC

## 2023-06-18 NOTE — Telephone Encounter (Signed)
Patient's daughter is calling back in regards to results.

## 2023-06-18 NOTE — Telephone Encounter (Signed)
Pt's daughter calling for Echo results. Please advise.

## 2023-06-19 NOTE — Telephone Encounter (Signed)
Jimmy P Mallipeddi, MD 06/18/2023  4:44 PM EDT     Heart pumping function worsened from 45-50% in 2021 to 35-40% now. Continue p.o lasix 20 mg once daily and can increase to 20 mg BID provided BP>90 mm Hg on Midodrine. Unable to add additional medications due to low Bps on Midodrine

## 2023-06-19 NOTE — Telephone Encounter (Signed)
Patients daughter notified and verbalized understanding (okay per DPR). Pt's daughter had no questions or concerns at this time. PCP copied.

## 2023-06-26 ENCOUNTER — Ambulatory Visit: Payer: Medicare Other | Admitting: Internal Medicine

## 2023-06-26 DIAGNOSIS — I951 Orthostatic hypotension: Secondary | ICD-10-CM | POA: Diagnosis not present

## 2023-06-26 DIAGNOSIS — J84112 Idiopathic pulmonary fibrosis: Secondary | ICD-10-CM | POA: Diagnosis not present

## 2023-06-26 DIAGNOSIS — E1129 Type 2 diabetes mellitus with other diabetic kidney complication: Secondary | ICD-10-CM | POA: Diagnosis not present

## 2023-06-26 DIAGNOSIS — I5022 Chronic systolic (congestive) heart failure: Secondary | ICD-10-CM | POA: Diagnosis not present

## 2023-06-29 ENCOUNTER — Ambulatory Visit: Payer: Medicare Other | Attending: Internal Medicine | Admitting: Internal Medicine

## 2023-06-29 ENCOUNTER — Encounter: Payer: Self-pay | Admitting: Internal Medicine

## 2023-06-29 VITALS — BP 118/62 | HR 52 | Ht 72.0 in | Wt 188.4 lb

## 2023-06-29 DIAGNOSIS — E7800 Pure hypercholesterolemia, unspecified: Secondary | ICD-10-CM | POA: Insufficient documentation

## 2023-06-29 DIAGNOSIS — I255 Ischemic cardiomyopathy: Secondary | ICD-10-CM | POA: Diagnosis not present

## 2023-06-29 DIAGNOSIS — I5043 Acute on chronic combined systolic (congestive) and diastolic (congestive) heart failure: Secondary | ICD-10-CM | POA: Insufficient documentation

## 2023-06-29 DIAGNOSIS — I2510 Atherosclerotic heart disease of native coronary artery without angina pectoris: Secondary | ICD-10-CM | POA: Insufficient documentation

## 2023-06-29 DIAGNOSIS — I10 Essential (primary) hypertension: Secondary | ICD-10-CM | POA: Insufficient documentation

## 2023-06-29 NOTE — Progress Notes (Signed)
Cardiology Office Note  Date: 06/29/2023   ID: Jimmy Knox, DOB 01-Oct-1932, MRN [id number]  PCP:  Carylon Perches, MD  Cardiologist:  Marjo Bicker, MD Electrophysiologist:  None   Reason for Office Visit: Follow-up CAD   History of Present Illness: Jimmy Knox is a 89 y.o. male known to have CAD s/p RCA PCI in 2019 with residual Lcx CTO that was unable to be intervened due to technical difficulties and 50% mid LAD lesion with ICM LVEF 35 to 40%, DM 2, HLD, is here for follow-up visit.  Echocardiogram in 6/24 showed 35 to 40% LVEF (worsened from 45 to 50%).  P.o. Lasix 20 mg increased to twice daily regimen.  Event monitor from 5/24 showed 3.9% PAC burden, 3.6% PVC burden, 7 runs of NSVT (fastest lasting 7 beats and the longest lasting 9.7 seconds), 86 runs of SVT (fastest lasting 11 beats and longest lasting 15.9 seconds).  No patient triggered events were noted.  Metoprolol tartrate 12.5 mg twice daily was added.  He is here for follow-up visit, accompanied by daughter.  Does not drink adequate water.  Feels dizzy not every day but few days per week.  Has intermittent SOB, has SOB for 5 minutes before he goes to bed but has no PND.  No orthopnea.  No leg swelling.  No angina.  No palpitations or syncope.  Past Medical History:  Diagnosis Date   Anxiety    Arthritis    "right hip" (07/15/2018)   Carotid artery disease (HCC)    a. <50% bilaterally 05/2016.   Chronic combined systolic and diastolic CHF (congestive heart failure) (HCC)    Complication of anesthesia    "didn't wake up well after OR on 07/30/2016; took 2-3 to hold me down"   Coronary artery disease    a. prior MI/stent around 2000. b. Nuc 09/2015 -> abnl, mgmd medically.   Facial neuralgia    History of blood transfusion 07/30/2016   "after the operation"   Hyperlipidemia    Hypertension    Ischemic cardiomyopathy    Melanoma of nose (HCC)    "inside on the right side"   Myocardial infarction (HCC) ~  2000   Pneumonia 1935- 1970s ,"several times"   [facility] spotted fever 1942   Skin cancer    "on my back; arms; froze them off"   Type 2 diabetes, diet controlled (HCC)     Past Surgical History:  Procedure Laterality Date   APPENDECTOMY     BACK SURGERY     BIOPSY  10/16/2021   Procedure: BIOPSY;  Surgeon: Malissa Hippo, MD;  [facility];  Service: Endoscopy;;   BIOPSY  10/16/2022   Procedure: BIOPSY;  Surgeon: Dolores Frame, MD;  Location: [facility];  Service: Gastroenterology;;   CATARACT EXTRACTION, BILATERAL Bilateral    COLONOSCOPY N/A 09/28/2015   Procedure: COLONOSCOPY;  Surgeon: Malissa Hippo, MD;  [facility]: [facility];  Service: Endoscopy;  Laterality: N/A;  955   CORONARY ANGIOPLASTY WITH STENT PLACEMENT  2000   CORONARY CTO INTERVENTION  07/15/2018   CORONARY CTO INTERVENTION N/A 07/15/2018   Procedure: CORONARY CTO INTERVENTION;  Surgeon: Swaziland, Peter M, MD;  Location: [facility] INVASIVE CV LAB;  Service: Cardiovascular;  Laterality: N/A;   CYSTOSCOPY W/ URETERAL STENT PLACEMENT     ESOPHAGOGASTRODUODENOSCOPY (EGD) WITH PROPOFOL N/A 10/16/2021   Procedure: ESOPHAGOGASTRODUODENOSCOPY (EGD) WITH PROPOFOL;  Surgeon: Malissa Hippo, MD;  [facility];  Service:  Endoscopy;  Laterality: N/A;  11:05   ESOPHAGOGASTRODUODENOSCOPY (EGD) WITH PROPOFOL N/A 10/16/2022   Procedure: ESOPHAGOGASTRODUODENOSCOPY (EGD) WITH PROPOFOL;  Surgeon: Dolores Frame, MD;  [facility]: [facility];  Service: Gastroenterology;  Laterality: N/A;  1130 ASA 3   EYE SURGERY Right 2013   "got hit by a limb & knocked my eye out"   JOINT REPLACEMENT     LEFT HEART CATH AND CORONARY ANGIOGRAPHY N/A 06/15/2018   Procedure: LEFT HEART CATH AND CORONARY ANGIOGRAPHY;  Surgeon: Swaziland, Peter M, MD;  Location: [facility] INVASIVE CV LAB;  Service: Cardiovascular;  Laterality: N/A;   LUMBAR DISC SURGERY  1992   MELANOMA EXCISION Right    "inside my nose"    TONSILLECTOMY     TOTAL HIP ARTHROPLASTY Right 07/29/2016   Procedure: TOTAL HIP ARTHROPLASTY;  Surgeon: Valeria Batman, MD;  Location: [facility] OR;  Service: Orthopedics;  Laterality: Right;    Current Outpatient Medications  Medication Sig Dispense Refill   aspirin EC 81 MG tablet Take 1 tablet (81 mg total) by mouth in the morning. 30 tablet 11   atorvastatin (LIPITOR) 10 MG tablet Take 1 tablet (10 mg total) by mouth daily. 90 tablet 3   bismuth subsalicylate (PEPTO BISMOL) 262 MG chewable tablet Chew 262-524 mg by mouth as needed for indigestion.     furosemide (LASIX) 20 MG tablet Take 1 tablet (20 mg total) by mouth 2 (two) times daily. 60 tablet 6   gabapentin (NEURONTIN) 300 MG capsule Take 300 mg by mouth 2 (two) times daily.     metoprolol tartrate (LOPRESSOR) 25 MG tablet Take 0.5 tablets (12.5 mg total) by mouth 2 (two) times daily. (metoprolol tartrate 12.5 mg twice daily provided BP is more than 90 mmHg SBP) 180 tablet 3   midodrine (PROAMATINE) 10 MG tablet Take 10 mg by mouth 2 (two) times daily. Started by pcp this past friday     nitroGLYCERIN (NITROSTAT) 0.4 MG SL tablet Place 1 tablet (0.4 mg total) under the tongue every 5 (five) minutes as needed for chest pain. 25 tablet 2   polyethylene glycol powder (GLYCOLAX/MIRALAX) 17 GM/SCOOP powder Take 8.5 g by mouth daily. (Patient taking differently: Take 17 g by mouth daily. 1-2 times per day.) 255 g 0   No current facility-administered medications for this visit.   Allergies:  Patient has no known allergies.   Social History: The patient  reports that he quit smoking about 52 years ago. His smoking use included cigarettes. He started smoking about 79 years ago. He has a 27.00 pack-year smoking history. He has been exposed to tobacco smoke. He has never used smokeless tobacco. He reports that he does not currently use alcohol. He reports that he does not use drugs.   Family History: The patient's family history includes  Alzheimer's disease in his mother; Diabetes in his brother and maternal uncle; Diabetes Mellitus II in his brother.   ROS:  Please see the history of present illness. Otherwise, complete review of systems is positive for none  All other systems are reviewed and negative.   Physical Exam: VS:  BP 118/62   Pulse (!) 52   Ht 6' (1.829 m)   Wt 188 lb 6.4 oz (85.5 kg)   SpO2 96%   BMI 25.55 kg/m , BMI Body mass index is 25.55 kg/m.  Wt Readings from Last 3 Encounters:  06/29/23 188 lb 6.4 oz (85.5 kg)  05/27/23 188 lb (85.3 kg)  05/06/23 188 lb 6.4 oz (  85.5 kg)    General: Patient appears comfortable at rest. HEENT: Conjunctiva and lids normal, oropharynx clear with moist mucosa. Neck: Supple, no elevated JVP or carotid bruits, no thyromegaly. Lungs: Clear to auscultation, nonlabored breathing at rest. Cardiac: Regular rate and rhythm, no S3 or significant systolic murmur, no pericardial rub. Abdomen: Soft, nontender, no hepatomegaly, bowel sounds present, no guarding or rebound. Extremities: No pitting edema, distal pulses 2+. Skin: Warm and dry. Musculoskeletal: No kyphosis. Neuropsychiatric: Alert and oriented x3, affect grossly appropriate.  Recent Labwork: 10/15/2022: BUN 24; Creatinine, Ser 1.47; Potassium 4.8; Sodium 139 05/06/2023: BNP 140.8  No results found for: "CHOL", "TRIG", "HDL", "CHOLHDL", "VLDL", "LDLCALC", "LDLDIRECT"   Assessment and Plan:  Patient is a 89 year old M known to have CAD s/p RCA PCI in 2019 with residual Lcx CTO that was unable to be intervened due to technical difficulties and 50% mid LAD lesion with ICM LVEF 35 to 40% (dropped from 45 to 50% in 2019), DM 2, HLD, is here for follow-up visit.  # Acute on chronic systolic heart failure exacerbation, near compensated -Continues to have intermittent SOB but improved from before.  Home BPs range around 80 to 90 mmHg.  Continue p.o. Lasix 20 mg twice daily (increased from prior dose of p.o. Lasix 20 mg  once daily after echo result).  Echocardiogram from 6/24 showed LVEF worsened from 45 to 50% in 2021-35 to 40% now. Patient has known LCx CTO and mid LAD 50% stenosis in 2019 that was medically managed.  I do not anticipate scheduling any LHC due to his advanced age and no angina.  Will treat medically.  # Dizziness: 2-week event monitor from 5/24 showed 3.9% PAC burden, 3.6% PVC burden, 7 runs of NSVT (fastest lasting 7 beats and the longest lasting 9.7 seconds), 86 runs of SVT (fastest lasting 11 beats and longest lasting 15.9 seconds).  No patient triggered events were noted.  Continue midodrine 7.5 mg twice daily and continue metoprolol tartrate 12.5 mg twice daily..  Encourage patient to have adequate hydration.  # CAD s/p RCA PCI in 2019 with residual Lcx CTO that was unable to be intervened due to technical difficulties and 50% mid LAD lesion with ICM LVEF 45 to 50%, currently angina free -Continue aspirin 81 mg once daily and atorvastatin 10 mg nightly. -ER precautions for chest pain  # HLD: Continue atorvastatin 10 mg nightly, goal LDL less than 70.   I have spent a total of 30 minutes with patient reviewing chart, EKGs, labs and examining patient as well as establishing an assessment and plan that was discussed with the patient.  > 50% of time was spent in direct patient care.    Medication Adjustments/Labs and Tests Ordered: Current medicines are reviewed at length with the patient today.  Concerns regarding medicines are outlined above.   Tests Ordered: No orders of the defined types were placed in this encounter.   Medication Changes: No orders of the defined types were placed in this encounter.   Disposition:  Follow up  3 months  Signed, Mathew Postiglione Verne Spurr, MD, 06/29/2023 12:03 PM    [facility] [address] [place], [place] [address]

## 2023-06-29 NOTE — Patient Instructions (Signed)
Medication Instructions:  ?Your physician recommends that you continue on your current medications as directed. Please refer to the Current Medication list given to you today. ? ? ?Labwork: ?None today ? ?Testing/Procedures: ?None today ? ?Follow-Up: ?3 months ? ?Any Other Special Instructions Will Be Listed Below (If Applicable). ? ?If you need a refill on your cardiac medications before your next appointment, please call your pharmacy. ? ?

## 2023-07-16 ENCOUNTER — Other Ambulatory Visit: Payer: Self-pay

## 2023-07-16 ENCOUNTER — Emergency Department (HOSPITAL_COMMUNITY): Payer: Medicare Other

## 2023-07-16 ENCOUNTER — Emergency Department (HOSPITAL_COMMUNITY)
Admission: EM | Admit: 2023-07-16 | Discharge: 2023-07-16 | Disposition: A | Discharge status: Home / Self Care | Payer: Medicare Other | Attending: Emergency Medicine | Admitting: Emergency Medicine

## 2023-07-16 ENCOUNTER — Encounter (HOSPITAL_COMMUNITY): Payer: Self-pay

## 2023-07-16 DIAGNOSIS — I6523 Occlusion and stenosis of bilateral carotid arteries: Secondary | ICD-10-CM | POA: Diagnosis not present

## 2023-07-16 DIAGNOSIS — R42 Dizziness and giddiness: Secondary | ICD-10-CM | POA: Diagnosis not present

## 2023-07-16 DIAGNOSIS — R1111 Vomiting without nausea: Secondary | ICD-10-CM | POA: Diagnosis not present

## 2023-07-16 DIAGNOSIS — I2510 Atherosclerotic heart disease of native coronary artery without angina pectoris: Secondary | ICD-10-CM | POA: Insufficient documentation

## 2023-07-16 DIAGNOSIS — R55 Syncope and collapse: Secondary | ICD-10-CM

## 2023-07-16 DIAGNOSIS — Z7982 Long term (current) use of aspirin: Secondary | ICD-10-CM | POA: Diagnosis not present

## 2023-07-16 LAB — CBC
HCT: 41.4 % (ref 39.0–52.0)
Hemoglobin: 13.9 g/dL (ref 13.0–17.0)
MCH: 31.2 pg (ref 26.0–34.0)
MCHC: 33.6 g/dL (ref 30.0–36.0)
MCV: 93 fL (ref 80.0–100.0)
Platelets: 170 10*3/uL (ref 150–400)
RBC: 4.45 MIL/uL (ref 4.22–5.81)
RDW: 13.3 % (ref 11.5–15.5)
WBC: 8.1 10*3/uL (ref 4.0–10.5)
nRBC: 0 % (ref 0.0–0.2)

## 2023-07-16 LAB — TROPONIN I (HIGH SENSITIVITY)
Troponin I (High Sensitivity): 9 ng/L (ref <18)
Troponin I (High Sensitivity): 12 ng/L (ref <18)

## 2023-07-16 LAB — BASIC METABOLIC PANEL
Anion gap: 10 (ref 5–15)
BUN: 20 mg/dL (ref 8–23)
CO2: 22 mmol/L (ref 22–32)
Calcium: 8.5 mg/dL — ABNORMAL LOW (ref 8.9–10.3)
Chloride: 101 mmol/L (ref 98–111)
Creatinine, Ser: 1.22 mg/dL (ref 0.61–1.24)
GFR, Estimated: 57 mL/min — ABNORMAL LOW (ref >60)
Glucose, Bld: 196 mg/dL — ABNORMAL HIGH (ref 70–99)
Potassium: 4.4 mmol/L (ref 3.5–5.1)
Sodium: 133 mmol/L — ABNORMAL LOW (ref 135–145)

## 2023-07-16 LAB — HEPATIC FUNCTION PANEL
ALT: 19 U/L (ref 0–44)
AST: 32 U/L (ref 15–41)
Albumin: 3.5 g/dL (ref 3.5–5.0)
Alkaline Phosphatase: 102 U/L (ref 38–126)
Bilirubin, Direct: 0.4 mg/dL — ABNORMAL HIGH (ref 0.0–0.2)
Indirect Bilirubin: 1.4 mg/dL — ABNORMAL HIGH (ref 0.3–0.9)
Total Bilirubin: 1.8 mg/dL — ABNORMAL HIGH (ref 0.3–1.2)
Total Protein: 6.9 g/dL (ref 6.5–8.1)

## 2023-07-16 LAB — CBG MONITORING, ED: Glucose-Capillary: 187 mg/dL — ABNORMAL HIGH (ref 70–99)

## 2023-07-16 MED ORDER — SODIUM CHLORIDE 0.9 % IV BOLUS
500.0000 mL | Freq: Once | INTRAVENOUS | Status: AC
  Administered 2023-07-16: 500 mL via INTRAVENOUS

## 2023-07-16 NOTE — ED Provider Notes (Signed)
[facility] EMERGENCY DEPARTMENT AT [facility] Provider Note   CSN: [id number] Arrival date & time: 07/16/23  1213     History {Add pertinent medical, surgical, social history, OB history to HPI:1} Chief Complaint  Patient presents with   Loss of Consciousness    Jimmy Knox is a 89 y.o. male.  Patient was at the lunch table and became dizzy and passed out.  He has a history of coronary artery disease and has had many dizzy spells   Loss of Consciousness      Home Medications Prior to Admission medications   Medication Sig Start Date End Date Taking? Authorizing Provider  aspirin EC 81 MG tablet Take 1 tablet (81 mg total) by mouth in the morning. 10/17/21   Rehman, Joline Maxcy, MD  atorvastatin (LIPITOR) 10 MG tablet Take 1 tablet (10 mg total) by mouth daily. 11/29/21 05/05/24  O'NealRonnald Ramp, MD  bismuth subsalicylate (PEPTO BISMOL) 262 MG chewable tablet Chew 262-524 mg by mouth as needed for indigestion.    [provider]  furosemide (LASIX) 20 MG tablet Take 1 tablet (20 mg total) by mouth 2 (two) times daily. 05/06/23   Mallipeddi, Vishnu P, MD  gabapentin (NEURONTIN) 300 MG capsule Take 300 mg by mouth 2 (two) times daily. 02/25/21   [provider]  metoprolol tartrate (LOPRESSOR) 25 MG tablet Take 0.5 tablets (12.5 mg total) by mouth 2 (two) times daily. (metoprolol tartrate 12.5 mg twice daily provided BP is more than 90 mmHg SBP) 06/18/23   Mallipeddi, Vishnu P, MD  midodrine (PROAMATINE) 10 MG tablet Take 10 mg by mouth 2 (two) times daily. Started by pcp this past friday    [provider]  nitroGLYCERIN (NITROSTAT) 0.4 MG SL tablet Place 1 tablet (0.4 mg total) under the tongue every 5 (five) minutes as needed for chest pain. 07/16/18 05/05/24  Robbie Lis M, PA-C  polyethylene glycol powder (GLYCOLAX/MIRALAX) 17 GM/SCOOP powder Take 8.5 g by mouth daily. Patient taking differently: Take 17 g by mouth daily. 1-2 times  per day. 07/25/21   Malissa Hippo, MD      Allergies    Patient has no known allergies.    Review of Systems   Review of Systems  Cardiovascular:  Positive for syncope.    Physical Exam Updated Vital Signs BP (!) 141/73   Pulse 73   Temp (!) 97.5 F (36.4 C) (Oral)   Resp 12   Ht 6' (1.829 m)   Wt 85.5 kg   SpO2 97%   BMI 25.55 kg/m  Physical Exam  ED Results / Procedures / Treatments   Labs (all labs ordered are listed, but only abnormal results are displayed) Labs Reviewed  BASIC METABOLIC PANEL - Abnormal; Notable for the following components:      Result Value   Sodium 133 (*)    Glucose, Bld 196 (*)    Calcium 8.5 (*)    GFR, Estimated 57 (*)    All other components within normal limits  HEPATIC FUNCTION PANEL - Abnormal; Notable for the following components:   Total Bilirubin 1.8 (*)    Bilirubin, Direct 0.4 (*)    Indirect Bilirubin 1.4 (*)    All other components within normal limits  CBG MONITORING, ED - Abnormal; Notable for the following components:   Glucose-Capillary 187 (*)    All other components within normal limits  CBC  TROPONIN I (HIGH SENSITIVITY)  TROPONIN I (HIGH SENSITIVITY)  EKG EKG Interpretation Date/Time:  Thursday July 16 2023 12:30:41 EDT Ventricular Rate:  69 PR Interval:  187 QRS Duration:  121 QT Interval:  469 QTC Calculation: 503 R Axis:   -21  Text Interpretation: Sinus rhythm Multiple premature complexes, vent & supraven Nonspecific intraventricular conduction delay Inferolateral infarct, old Probable anteroseptal infarct, old Confirmed by Bethann Berkshire [phone]) on 07/16/2023 3:31:03 PM  Radiology CT Head Wo Contrast  Result Date: 07/16/2023 CLINICAL DATA:  Syncope EXAM: CT HEAD WITHOUT CONTRAST TECHNIQUE: Contiguous axial images were obtained from the base of the skull through the vertex without intravenous contrast. RADIATION DOSE REDUCTION: This exam was performed according to the departmental dose-optimization  program which includes automated exposure control, adjustment of the mA and/or kV according to patient size and/or use of iterative reconstruction technique. COMPARISON:  Head CT 01/26/2019 FINDINGS: Brain: There is no acute intracranial hemorrhage, extra-axial fluid collection, or acute infarct Parenchymal volume is within expected limits for age. The ventricles are stable and normal in size. Patchy hypodensity in the supratentorial white matter likely reflects sequela of underlying chronic small-vessel ischemic change. The pituitary and suprasellar region are normal. There is no mass lesion. There is no mass effect or midline shift. Vascular: There is calcification of the bilateral carotid siphons. Skull: Normal. Negative for fracture or focal lesion. Sinuses/Orbits: The imaged paranasal sinuses are clear. Bilateral lens implants are in place. The globes and orbits are otherwise unremarkable. Other: The mastoid air cells and middle ear cavities are clear. IMPRESSION: No acute intracranial pathology. Electronically Signed   By: Lesia Hausen M.D.   On: 07/16/2023 13:34    Procedures Procedures  {Document cardiac monitor, telemetry assessment procedure when appropriate:1}  Medications Ordered in ED Medications  sodium chloride 0.9 % bolus 500 mL (0 mLs Intravenous Stopped 07/16/23 1440)    ED Course/ Medical Decision Making/ A&P   {I spoke with the patient's cardiologist Dr. Jari Favre and she recommended decreasing his Lasix Click here for ABCD2, HEART and other calculatorsREFRESH Note before signing :1}                          Medical Decision Making Amount and/or Complexity of Data Reviewed Labs: ordered. Radiology: ordered.   Patient with syncopal episode.  Labs unremarkable.  Patient will decrease his Lasix to once a day and follow-up with his family doctor  {Document critical care time when appropriate:1} {Document review of labs and clinical decision tools ie heart score, Chads2Vasc2  etc:1}  {Document your independent review of radiology images, and any outside records:1} {Document your discussion with family members, caretakers, and with consultants:1} {Document social determinants of health affecting pt's care:1} {Document your decision making why or why not admission, treatments were needed:1} Final Clinical Impression(s) / ED Diagnoses Final diagnoses:  Syncope and collapse    Rx / DC Orders ED Discharge Orders     None

## 2023-07-16 NOTE — ED Triage Notes (Signed)
Pt brought in by [facility] for syncopal episode while at a restaurant. Pt states SOB, pale in color, and multiple episodes of vomiting. EMS gave 500 mL of NS bolus. Pt has hx of cardiac history, controlled a-fib w/ PVCs.

## 2023-07-16 NOTE — Discharge Instructions (Signed)
Decrease your medicine furosemide so you are taking it just once a day instead of twice a day.  Follow-up with your family doctor next week and return if any problem.  Make sure you drink enough fluids

## 2023-07-23 ENCOUNTER — Telehealth: Payer: Self-pay

## 2023-07-23 ENCOUNTER — Ambulatory Visit: Payer: Medicare Other | Admitting: Student

## 2023-07-23 DIAGNOSIS — R55 Syncope and collapse: Secondary | ICD-10-CM | POA: Diagnosis not present

## 2023-07-23 DIAGNOSIS — I951 Orthostatic hypotension: Secondary | ICD-10-CM | POA: Diagnosis not present

## 2023-07-23 NOTE — Telephone Encounter (Signed)
Transition Care Management Follow-up Telephone Call Date of discharge and from where: 07/16/2023 [facility] How have you been since you were released from the hospital? Per patient's daughter he is feeling much better. Any questions or concerns? No  Items Reviewed: Did the pt receive and understand the discharge instructions provided? Yes  Medications obtained and verified?  No medication prescribed. Other? No  Any new allergies since your discharge? No  Dietary orders reviewed? Yes Do you have support at home? Yes   Follow up appointments reviewed:  PCP Hospital f/u appt confirmed? No  Scheduled to see  on  @ . Specialist Hospital f/u appt confirmed? No  Scheduled to see  on  @ . Are transportation arrangements needed? No  If their condition worsens, is the pt aware to call PCP or go to the Emergency Dept.? Yes Was the patient provided with contact information for the PCP's office or ED? Yes Was to pt encouraged to call back with questions or concerns? Yes  Shital Crayton [facility]  [facility] Population Health Community Resource Care Guide   [email or web address]  ?? [phone]   Website: [email or web address]  [email or web address]

## 2023-08-14 ENCOUNTER — Other Ambulatory Visit (INDEPENDENT_AMBULATORY_CARE_PROVIDER_SITE_OTHER): Payer: Self-pay | Admitting: Gastroenterology

## 2023-09-23 DIAGNOSIS — I5022 Chronic systolic (congestive) heart failure: Secondary | ICD-10-CM | POA: Diagnosis not present

## 2023-09-23 DIAGNOSIS — E1129 Type 2 diabetes mellitus with other diabetic kidney complication: Secondary | ICD-10-CM | POA: Diagnosis not present

## 2023-09-23 DIAGNOSIS — I951 Orthostatic hypotension: Secondary | ICD-10-CM | POA: Diagnosis not present

## 2023-10-01 DIAGNOSIS — Z23 Encounter for immunization: Secondary | ICD-10-CM | POA: Diagnosis not present

## 2023-10-01 DIAGNOSIS — I5022 Chronic systolic (congestive) heart failure: Secondary | ICD-10-CM | POA: Diagnosis not present

## 2023-10-01 DIAGNOSIS — J84112 Idiopathic pulmonary fibrosis: Secondary | ICD-10-CM | POA: Diagnosis not present

## 2023-10-01 DIAGNOSIS — E1122 Type 2 diabetes mellitus with diabetic chronic kidney disease: Secondary | ICD-10-CM | POA: Diagnosis not present

## 2023-10-08 DIAGNOSIS — M79674 Pain in right toe(s): Secondary | ICD-10-CM | POA: Diagnosis not present

## 2023-10-08 DIAGNOSIS — B351 Tinea unguium: Secondary | ICD-10-CM | POA: Diagnosis not present

## 2023-10-13 ENCOUNTER — Ambulatory Visit: Payer: Medicare Other | Admitting: Internal Medicine

## 2023-10-15 ENCOUNTER — Other Ambulatory Visit: Payer: Self-pay | Admitting: Internal Medicine

## 2023-10-22 ENCOUNTER — Other Ambulatory Visit (INDEPENDENT_AMBULATORY_CARE_PROVIDER_SITE_OTHER): Payer: Self-pay | Admitting: Gastroenterology

## 2023-11-04 DIAGNOSIS — Z23 Encounter for immunization: Secondary | ICD-10-CM | POA: Diagnosis not present

## 2023-12-02 ENCOUNTER — Encounter: Payer: Self-pay | Admitting: Internal Medicine

## 2023-12-02 ENCOUNTER — Ambulatory Visit: Payer: Medicare Other | Attending: Internal Medicine | Admitting: Internal Medicine

## 2023-12-02 VITALS — BP 116/72 | HR 78 | Ht 72.0 in | Wt 196.2 lb

## 2023-12-02 DIAGNOSIS — I5022 Chronic systolic (congestive) heart failure: Secondary | ICD-10-CM

## 2023-12-02 MED ORDER — ATORVASTATIN CALCIUM 10 MG PO TABS: 10.0000 mg | ORAL_TABLET | Freq: Every day | ORAL | 3 refills | Status: DC

## 2023-12-02 MED ORDER — NITROGLYCERIN 0.4 MG SL SUBL: 0.4000 mg | SUBLINGUAL_TABLET | SUBLINGUAL | 2 refills | Status: DC | PRN

## 2023-12-02 MED ORDER — FUROSEMIDE 20 MG PO TABS: 20.0000 mg | ORAL_TABLET | Freq: Two times a day (BID) | ORAL | 0 refills | Status: DC

## 2023-12-02 MED ORDER — MIDODRINE HCL 10 MG PO TABS: 10.0000 mg | ORAL_TABLET | Freq: Two times a day (BID) | ORAL | 2 refills | Status: DC

## 2023-12-02 NOTE — Progress Notes (Signed)
Cardiology Office Note  Date: 12/02/2023   ID: [name], DOB 03-15-1932, MRN [id number]  PCP:  Jimmy Perches, MD  Cardiologist:  Jimmy Bicker, MD Electrophysiologist:  None    History of Present Illness: Jimmy Knox is a 89 y.o. male known to have CAD s/p RCA PCI in 2019 with residual Lcx CTO that was unable to be intervened due to technical difficulties and 50% mid LAD lesion with ICM LVEF 35 to 40%, DM 2, HLD, is here for follow-up visit.  Echocardiogram in 6/24 showed 35 to 40% LVEF (worsened from 45 to 50%).  P.o. Lasix 20 mg increased to twice daily regimen.  Event monitor from 5/24 showed 3.9% PAC burden, 3.6% PVC burden, 7 runs of NSVT (fastest lasting 7 beats and the longest lasting 9.7 seconds), 86 runs of SVT (fastest lasting 11 beats and longest lasting 15.9 seconds).  No patient triggered events were noted.  Metoprolol tartrate 12.5 mg twice daily was added but was later discontinued due to worsening symptoms.  He is here for follow-up visit, accompanied by daughter.    Overall doing great compared to the last time I saw him.  Last episode DOE was a few weeks ago.  He walks with his cane inside and outside his house and can perform his daily activities with no symptoms.  No angina, palpitations, dizziness, syncope.  No leg swelling.  No orthopnea or PND.  His great granddaughter was born in July 2024.  Past Medical History:  Diagnosis Date   Anxiety    Arthritis    "right hip" (07/15/2018)   Carotid artery disease (HCC)    a. <50% bilaterally 05/2016.   Chronic combined systolic and diastolic CHF (congestive heart failure) (HCC)    Complication of anesthesia    "didn't wake up well after OR on 07/30/2016; took 2-3 to hold me down"   Coronary artery disease    a. prior MI/stent around 2000. b. Nuc 09/2015 -> abnl, mgmd medically.   Facial neuralgia    History of blood transfusion 07/30/2016   "after the operation"   Hyperlipidemia    Hypertension     Ischemic cardiomyopathy    Melanoma of nose (HCC)    "inside on the right side"   Myocardial infarction (HCC) ~ 2000   Pneumonia 1935- 1970s ,"several times"   [facility] spotted fever 1942   Skin cancer    "on my back; arms; froze them off"   Type 2 diabetes, diet controlled (HCC)     Past Surgical History:  Procedure Laterality Date   APPENDECTOMY     BACK SURGERY     BIOPSY  10/16/2021   Procedure: BIOPSY;  Surgeon: Jimmy Hippo, MD;  [facility];  Service: Endoscopy;;   BIOPSY  10/16/2022   Procedure: BIOPSY;  Surgeon: Jimmy Frame, MD;  [facility];  Service: Gastroenterology;;   CATARACT EXTRACTION, BILATERAL Bilateral    COLONOSCOPY N/A 09/28/2015   Procedure: COLONOSCOPY;  Surgeon: Jimmy Hippo, MD;  [facility];  Service: Endoscopy;  Laterality: N/A;  955   CORONARY ANGIOPLASTY WITH STENT PLACEMENT  2000   CORONARY CTO INTERVENTION  07/15/2018   CORONARY CTO INTERVENTION N/A 07/15/2018   Procedure: CORONARY CTO INTERVENTION;  Surgeon: Swaziland, Peter M, MD;  Location: [facility] INVASIVE CV LAB;  Service: Cardiovascular;  Laterality: N/A;   CYSTOSCOPY W/ URETERAL STENT PLACEMENT     ESOPHAGOGASTRODUODENOSCOPY (EGD) WITH PROPOFOL N/A 10/16/2021  Procedure: ESOPHAGOGASTRODUODENOSCOPY (EGD) WITH PROPOFOL;  Surgeon: Jimmy Hippo, MD;  [facility];  Service: Endoscopy;  Laterality: N/A;  11:05   ESOPHAGOGASTRODUODENOSCOPY (EGD) WITH PROPOFOL N/A 10/16/2022   Procedure: ESOPHAGOGASTRODUODENOSCOPY (EGD) WITH PROPOFOL;  Surgeon: Jimmy Frame, MD;  [facility];  Service: Gastroenterology;  Laterality: N/A;  1130 ASA 3   EYE SURGERY Right 2013   "got hit by a limb & knocked my eye out"   JOINT REPLACEMENT     LEFT HEART CATH AND CORONARY ANGIOGRAPHY N/A 06/15/2018   Procedure: LEFT HEART CATH AND CORONARY ANGIOGRAPHY;  Surgeon: Swaziland, Peter M, MD;  Location: [facility] INVASIVE CV LAB;  Service:  Cardiovascular;  Laterality: N/A;   LUMBAR DISC SURGERY  1992   MELANOMA EXCISION Right    "inside my nose"   TONSILLECTOMY     TOTAL HIP ARTHROPLASTY Right 07/29/2016   Procedure: TOTAL HIP ARTHROPLASTY;  Surgeon: Jimmy Batman, MD;  [facility]: [facility];  Service: Orthopedics;  Laterality: Right;    Current Outpatient Medications  Medication Sig Dispense Refill   aspirin EC 81 MG tablet Take 1 tablet (81 mg total) by mouth in the morning. 30 tablet 11   atorvastatin (LIPITOR) 10 MG tablet Take 1 tablet (10 mg total) by mouth daily. 90 tablet 3   bismuth subsalicylate (PEPTO BISMOL) 262 MG chewable tablet Chew 262-524 mg by mouth as needed for indigestion.     furosemide (LASIX) 20 MG tablet TAKE ONE TABLET BY MOUTH TWICE DAILY 60 tablet 0   gabapentin (NEURONTIN) 300 MG capsule Take 300 mg by mouth 2 (two) times daily.     midodrine (PROAMATINE) 10 MG tablet Take 10 mg by mouth 2 (two) times daily. Started by pcp this past friday     nitroGLYCERIN (NITROSTAT) 0.4 MG SL tablet Place 1 tablet (0.4 mg total) under the tongue every 5 (five) minutes as needed for chest pain. 25 tablet 2   pantoprazole (PROTONIX) 40 MG tablet TAKE ONE TABLET BY MOUTH EVERY DAY BEFORE BREAKFAST 30 tablet 3   polyethylene glycol powder (GLYCOLAX/MIRALAX) 17 GM/SCOOP powder Take 8.5 g by mouth daily. (Patient taking differently: Take 17 g by mouth daily. 1-2 times per day.) 255 g 0   No current facility-administered medications for this visit.   Allergies:  Patient has no known allergies.   Social History: The patient  reports that he quit smoking about 52 years ago. His smoking use included cigarettes. He started smoking about 79 years ago. He has a 27 pack-year smoking history. He has been exposed to tobacco smoke. He has never used smokeless tobacco. He reports that he does not currently use alcohol. He reports that he does not use drugs.   Family History: The patient's family history includes Alzheimer's disease  in his mother; Diabetes in his brother and maternal uncle; Diabetes Mellitus II in his brother.   ROS:  Please see the history of present illness. Otherwise, complete review of systems is positive for none  All other systems are reviewed and negative.   Physical Exam: VS:  BP 116/72   Pulse 78   Ht 6' (1.829 Knox)   Wt 196 lb 3.2 oz (89 kg)   SpO2 96%   BMI 26.61 kg/Knox , BMI Body mass index is 26.61 kg/Knox.  Wt Readings from Last 3 Encounters:  12/02/23 196 lb 3.2 oz (89 kg)  07/16/23 188 lb 6.1 oz (85.5 kg)  06/29/23 188 lb 6.4 oz (85.5 kg)  General: Patient appears comfortable at rest. HEENT: Conjunctiva and lids normal, oropharynx clear with moist mucosa. Neck: Supple, no elevated JVP or carotid bruits, no thyromegaly. Lungs: Clear to auscultation, nonlabored breathing at rest. Cardiac: Regular rate and rhythm, no S3 or significant systolic murmur, no pericardial rub. Abdomen: Soft, nontender, no hepatomegaly, bowel sounds present, no guarding or rebound. Extremities: No pitting edema, distal pulses 2+. Skin: Warm and dry. Musculoskeletal: No kyphosis. Neuropsychiatric: Alert and oriented x3, affect grossly appropriate.  Recent Labwork: 05/06/2023: BNP 140.8 07/16/2023: ALT 19; AST 32; BUN 20; Creatinine, Ser 1.22; Hemoglobin 13.9; Platelets 170; Potassium 4.4; Sodium 133  No results found for: "CHOL", "TRIG", "HDL", "CHOLHDL", "VLDL", "LDLCALC", "LDLDIRECT"   Assessment and Plan:  Patient is a 89 year old Knox known to have CAD s/p RCA PCI in 2019 with residual Lcx CTO that was unable to be intervened due to technical difficulties and 50% mid LAD lesion with ICM LVEF 35 to 40% (dropped from 45 to 50% in 2019), DM 2, HLD, is here for follow-up visit.  # Chronic systolic heart failure, compensated -Denies having DOE, orthopnea, PND or leg swelling.  Continue p.o. Lasix 20 mg twice daily in addition to midodrine 10 mg twice daily.  Discontinued metoprolol in the past due to worsening  symptoms. Echocardiogram from 6/24 showed LVEF worsened from 45 to 50% in 2021 to 35 to 40% now. Patient has known LCx CTO and mid LAD 50% stenosis in 2019 that was medically managed.  He does not benefit from invasive ischemia evaluation due to his advanced age and absence of angina.  Unable to uptitrate GDMT due to soft blood pressures requiring midodrine.  # Dizziness, resolved: 2-week event monitor from 5/24 showed 3.9% PAC burden, 3.6% PVC burden, 7 runs of NSVT (fastest lasting 7 beats and the longest lasting 9.7 seconds), 86 runs of SVT (fastest lasting 11 beats and longest lasting 15.9 seconds).  No patient triggered events were noted.  Continue midodrine 10 mg twice daily, encouraged p.o. hydration.  # CAD s/p RCA PCI in 2019 with residual Lcx CTO that was unable to be intervened due to technical difficulties and 50% mid LAD lesion with ICM LVEF 45 to 50%, currently angina free -Continue aspirin 81 mg once daily, atorvastatin 10 mg nightly -ER precautions for chest pain  # HLD: Continue atorvastatin 10 mg nightly, goal LDL less than 70.   Refilled medications.   Disposition:  Follow up  6 months  Signed, Lylie Blacklock Verne Spurr, MD, 06/29/2023 12:03 PM    [facility] at [facility] [address] [place], [place] [address]

## 2023-12-02 NOTE — Patient Instructions (Signed)

## 2024-01-11 DIAGNOSIS — E118 Type 2 diabetes mellitus with unspecified complications: Secondary | ICD-10-CM | POA: Diagnosis not present

## 2024-01-11 DIAGNOSIS — E785 Hyperlipidemia, unspecified: Secondary | ICD-10-CM | POA: Diagnosis not present

## 2024-01-11 DIAGNOSIS — Z79899 Other long term (current) drug therapy: Secondary | ICD-10-CM | POA: Diagnosis not present

## 2024-01-11 DIAGNOSIS — N200 Calculus of kidney: Secondary | ICD-10-CM | POA: Diagnosis not present

## 2024-01-11 DIAGNOSIS — I2510 Atherosclerotic heart disease of native coronary artery without angina pectoris: Secondary | ICD-10-CM | POA: Diagnosis not present

## 2024-01-11 DIAGNOSIS — I5022 Chronic systolic (congestive) heart failure: Secondary | ICD-10-CM | POA: Diagnosis not present

## 2024-01-11 DIAGNOSIS — M5186 Other intervertebral disc disorders, lumbar region: Secondary | ICD-10-CM | POA: Diagnosis not present

## 2024-01-11 DIAGNOSIS — I10 Essential (primary) hypertension: Secondary | ICD-10-CM | POA: Diagnosis not present

## 2024-01-15 ENCOUNTER — Other Ambulatory Visit (INDEPENDENT_AMBULATORY_CARE_PROVIDER_SITE_OTHER): Payer: Self-pay | Admitting: Gastroenterology

## 2024-01-15 NOTE — Telephone Encounter (Signed)
Patient will need an appointment for further refills. Last seen 01/26/2023.

## 2024-01-19 DIAGNOSIS — I10 Essential (primary) hypertension: Secondary | ICD-10-CM | POA: Diagnosis not present

## 2024-01-19 DIAGNOSIS — E1169 Type 2 diabetes mellitus with other specified complication: Secondary | ICD-10-CM | POA: Diagnosis not present

## 2024-01-19 DIAGNOSIS — I951 Orthostatic hypotension: Secondary | ICD-10-CM | POA: Diagnosis not present

## 2024-01-19 DIAGNOSIS — J84112 Idiopathic pulmonary fibrosis: Secondary | ICD-10-CM | POA: Diagnosis not present

## 2024-01-19 DIAGNOSIS — I2510 Atherosclerotic heart disease of native coronary artery without angina pectoris: Secondary | ICD-10-CM | POA: Diagnosis not present

## 2024-01-19 DIAGNOSIS — E785 Hyperlipidemia, unspecified: Secondary | ICD-10-CM | POA: Diagnosis not present

## 2024-01-22 ENCOUNTER — Other Ambulatory Visit: Payer: Self-pay | Admitting: Internal Medicine

## 2024-01-26 DIAGNOSIS — M545 Low back pain, unspecified: Secondary | ICD-10-CM | POA: Diagnosis not present

## 2024-01-26 DIAGNOSIS — M62830 Muscle spasm of back: Secondary | ICD-10-CM | POA: Diagnosis not present

## 2024-03-14 DIAGNOSIS — B351 Tinea unguium: Secondary | ICD-10-CM | POA: Diagnosis not present

## 2024-03-14 DIAGNOSIS — M79674 Pain in right toe(s): Secondary | ICD-10-CM | POA: Diagnosis not present

## 2024-04-15 ENCOUNTER — Other Ambulatory Visit: Payer: Self-pay | Admitting: Internal Medicine

## 2024-04-15 ENCOUNTER — Other Ambulatory Visit (INDEPENDENT_AMBULATORY_CARE_PROVIDER_SITE_OTHER): Payer: Self-pay | Admitting: Gastroenterology

## 2024-04-18 NOTE — Telephone Encounter (Signed)
 Patient needs office visit for refills, not seen since 12/2022.

## 2024-04-19 DIAGNOSIS — I10 Essential (primary) hypertension: Secondary | ICD-10-CM | POA: Diagnosis not present

## 2024-04-19 DIAGNOSIS — E118 Type 2 diabetes mellitus with unspecified complications: Secondary | ICD-10-CM | POA: Diagnosis not present

## 2024-04-19 DIAGNOSIS — E785 Hyperlipidemia, unspecified: Secondary | ICD-10-CM | POA: Diagnosis not present

## 2024-04-19 DIAGNOSIS — Z79899 Other long term (current) drug therapy: Secondary | ICD-10-CM | POA: Diagnosis not present

## 2024-04-19 DIAGNOSIS — I2510 Atherosclerotic heart disease of native coronary artery without angina pectoris: Secondary | ICD-10-CM | POA: Diagnosis not present

## 2024-04-19 DIAGNOSIS — K219 Gastro-esophageal reflux disease without esophagitis: Secondary | ICD-10-CM | POA: Diagnosis not present

## 2024-04-25 DIAGNOSIS — I5022 Chronic systolic (congestive) heart failure: Secondary | ICD-10-CM | POA: Diagnosis not present

## 2024-04-25 DIAGNOSIS — I951 Orthostatic hypotension: Secondary | ICD-10-CM | POA: Diagnosis not present

## 2024-04-25 DIAGNOSIS — E1169 Type 2 diabetes mellitus with other specified complication: Secondary | ICD-10-CM | POA: Diagnosis not present

## 2024-04-29 ENCOUNTER — Encounter: Payer: Self-pay | Admitting: Radiology

## 2024-05-05 DIAGNOSIS — M7522 Bicipital tendinitis, left shoulder: Secondary | ICD-10-CM | POA: Diagnosis not present

## 2024-05-26 DIAGNOSIS — C44311 Basal cell carcinoma of skin of nose: Secondary | ICD-10-CM | POA: Diagnosis not present

## 2024-05-26 DIAGNOSIS — D0471 Carcinoma in situ of skin of right lower limb, including hip: Secondary | ICD-10-CM | POA: Diagnosis not present

## 2024-05-26 DIAGNOSIS — L820 Inflamed seborrheic keratosis: Secondary | ICD-10-CM | POA: Diagnosis not present

## 2024-05-26 DIAGNOSIS — S70362A Insect bite (nonvenomous), left thigh, initial encounter: Secondary | ICD-10-CM | POA: Diagnosis not present

## 2024-05-30 DIAGNOSIS — M79674 Pain in right toe(s): Secondary | ICD-10-CM | POA: Diagnosis not present

## 2024-05-30 DIAGNOSIS — M79675 Pain in left toe(s): Secondary | ICD-10-CM | POA: Diagnosis not present

## 2024-05-30 DIAGNOSIS — B351 Tinea unguium: Secondary | ICD-10-CM | POA: Diagnosis not present

## 2024-06-02 ENCOUNTER — Ambulatory Visit: Payer: Medicare Other | Admitting: Internal Medicine

## 2024-07-10 ENCOUNTER — Other Ambulatory Visit: Payer: Self-pay | Admitting: Internal Medicine

## 2024-07-10 ENCOUNTER — Other Ambulatory Visit (INDEPENDENT_AMBULATORY_CARE_PROVIDER_SITE_OTHER): Payer: Self-pay | Admitting: Gastroenterology

## 2024-07-14 ENCOUNTER — Other Ambulatory Visit: Payer: Self-pay | Admitting: Internal Medicine

## 2024-08-02 DIAGNOSIS — E118 Type 2 diabetes mellitus with unspecified complications: Secondary | ICD-10-CM | POA: Diagnosis not present

## 2024-08-02 DIAGNOSIS — Z79899 Other long term (current) drug therapy: Secondary | ICD-10-CM | POA: Diagnosis not present

## 2024-08-02 DIAGNOSIS — I2510 Atherosclerotic heart disease of native coronary artery without angina pectoris: Secondary | ICD-10-CM | POA: Diagnosis not present

## 2024-08-08 DIAGNOSIS — M79675 Pain in left toe(s): Secondary | ICD-10-CM | POA: Diagnosis not present

## 2024-08-08 DIAGNOSIS — M79674 Pain in right toe(s): Secondary | ICD-10-CM | POA: Diagnosis not present

## 2024-08-08 DIAGNOSIS — B351 Tinea unguium: Secondary | ICD-10-CM | POA: Diagnosis not present

## 2024-08-25 DIAGNOSIS — I5022 Chronic systolic (congestive) heart failure: Secondary | ICD-10-CM | POA: Diagnosis not present

## 2024-08-25 DIAGNOSIS — E1169 Type 2 diabetes mellitus with other specified complication: Secondary | ICD-10-CM | POA: Diagnosis not present

## 2024-08-25 DIAGNOSIS — Z79899 Other long term (current) drug therapy: Secondary | ICD-10-CM | POA: Diagnosis not present

## 2024-08-25 DIAGNOSIS — I10 Essential (primary) hypertension: Secondary | ICD-10-CM | POA: Diagnosis not present

## 2024-10-10 ENCOUNTER — Other Ambulatory Visit (INDEPENDENT_AMBULATORY_CARE_PROVIDER_SITE_OTHER): Payer: Self-pay

## 2024-10-10 DIAGNOSIS — M79675 Pain in left toe(s): Secondary | ICD-10-CM | POA: Diagnosis not present

## 2024-10-10 DIAGNOSIS — M79674 Pain in right toe(s): Secondary | ICD-10-CM | POA: Diagnosis not present

## 2024-10-10 DIAGNOSIS — B351 Tinea unguium: Secondary | ICD-10-CM | POA: Diagnosis not present

## 2024-10-10 MED ORDER — PANTOPRAZOLE SODIUM 40 MG PO TBEC
40.0000 mg | DELAYED_RELEASE_TABLET | Freq: Every day | ORAL | 2 refills | Status: AC
Start: 2024-10-10 — End: ?

## 2024-10-12 ENCOUNTER — Encounter (INDEPENDENT_AMBULATORY_CARE_PROVIDER_SITE_OTHER): Payer: Self-pay | Admitting: Gastroenterology

## 2024-11-18 DIAGNOSIS — Z23 Encounter for immunization: Secondary | ICD-10-CM | POA: Diagnosis not present

## 2024-12-14 ENCOUNTER — Ambulatory Visit: Admitting: Internal Medicine

## 2024-12-14 ENCOUNTER — Encounter: Payer: Self-pay | Admitting: Internal Medicine

## 2024-12-14 VITALS — BP 126/74 | HR 84 | Ht 73.0 in | Wt 204.0 lb

## 2024-12-14 DIAGNOSIS — I5043 Acute on chronic combined systolic (congestive) and diastolic (congestive) heart failure: Secondary | ICD-10-CM | POA: Insufficient documentation

## 2024-12-14 DIAGNOSIS — I5022 Chronic systolic (congestive) heart failure: Secondary | ICD-10-CM | POA: Diagnosis present

## 2024-12-14 DIAGNOSIS — I10 Essential (primary) hypertension: Secondary | ICD-10-CM | POA: Insufficient documentation

## 2024-12-14 DIAGNOSIS — E7800 Pure hypercholesterolemia, unspecified: Secondary | ICD-10-CM | POA: Insufficient documentation

## 2024-12-14 DIAGNOSIS — I2510 Atherosclerotic heart disease of native coronary artery without angina pectoris: Secondary | ICD-10-CM | POA: Diagnosis present

## 2024-12-14 DIAGNOSIS — I255 Ischemic cardiomyopathy: Secondary | ICD-10-CM | POA: Insufficient documentation

## 2024-12-14 DIAGNOSIS — R0609 Other forms of dyspnea: Secondary | ICD-10-CM | POA: Insufficient documentation

## 2024-12-14 MED ORDER — FUROSEMIDE 20 MG PO TABS: 20.0000 mg | ORAL_TABLET | Freq: Two times a day (BID) | ORAL | 5 refills | Status: AC

## 2024-12-14 MED ORDER — NITROGLYCERIN 0.4 MG SL SUBL: 0.4000 mg | SUBLINGUAL_TABLET | SUBLINGUAL | 2 refills | Status: AC | PRN

## 2024-12-14 MED ORDER — ATORVASTATIN CALCIUM 10 MG PO TABS: 10.0000 mg | ORAL_TABLET | Freq: Every day | ORAL | 3 refills | Status: AC

## 2024-12-14 MED ORDER — PANTOPRAZOLE SODIUM 40 MG PO TBEC: 40.0000 mg | DELAYED_RELEASE_TABLET | Freq: Every day | ORAL | 2 refills | Status: AC

## 2024-12-14 MED ORDER — MIDODRINE HCL 10 MG PO TABS: 10.0000 mg | ORAL_TABLET | Freq: Two times a day (BID) | ORAL | 5 refills | Status: AC

## 2024-12-14 NOTE — Patient Instructions (Signed)

## 2024-12-14 NOTE — Progress Notes (Signed)
 Cardiology Office Note  Date: 12/14/2024   ID: SANTANNA Knox, DOB August 10, 1932, MRN [id number]  PCP:  Sheryle Carwin, MD  Cardiologist:  Diannah SHAUNNA Maywood, MD Electrophysiologist:  None    History of Present Illness: Jimmy Knox is a 89 y.o. male known to have CAD s/p RCA PCI in 2019 with residual Lcx CTO that was unable to be intervened due to technical difficulties and 50% mid LAD lesion with ICM LVEF 35 to 40%, DM 2, HLD, is here for follow-up visit.  Echocardiogram in 6/24 showed 35 to 40% LVEF (worsened from 45 to 50%).  P.o. Lasix  20 mg increased to twice daily regimen.  Event monitor from 5/24 showed 3.9% PAC burden, 3.6% PVC burden, 7 runs of NSVT (fastest lasting 7 beats and the longest lasting 9.7 seconds), 86 runs of SVT (fastest lasting 11 beats and longest lasting 15.9 seconds).  No patient triggered events were noted.  Metoprolol  tartrate 12.5 mg twice daily was added but was later discontinued due to worsening symptoms.  He is here for follow-up visit, accompanied by daughter.    Doing great.  Does not have any symptoms of angina or DOE.  No orthopnea, PND, leg swelling.  No dizziness, palpitations, leg swelling.  His great granddaughter was born in July 2024.  Past Medical History:  Diagnosis Date   Anxiety    Arthritis    right hip (07/15/2018)   Carotid artery disease    a. <50% bilaterally 05/2016.   Chronic combined systolic and diastolic CHF (congestive heart failure) (HCC)    Complication of anesthesia    didn't wake up well after OR on 07/30/2016; took 2-3 to hold me down   Coronary artery disease    a. prior MI/stent around 2000. b. Nuc 09/2015 -> abnl, mgmd medically.   Facial neuralgia    History of blood transfusion 07/30/2016   after the operation   Hyperlipidemia    Hypertension    Ischemic cardiomyopathy    Melanoma of nose (HCC)    inside on the right side   Myocardial infarction (HCC) ~ 2000   Pneumonia 1935- 1970s ,several  times   [facility] spotted fever 1942   Skin cancer    on my back; arms; froze them off   Type 2 diabetes, diet controlled [facility])     Past Surgical History:  Procedure Laterality Date   APPENDECTOMY     BACK SURGERY     BIOPSY  10/16/2021   Procedure: BIOPSY;  Surgeon: Golda Claudis PENNER, MD;  [facility];  Service: Endoscopy;;   BIOPSY  10/16/2022   Procedure: BIOPSY;  Surgeon: Eartha Angelia Sieving, MD;  [facility];  Service: Gastroenterology;;   CATARACT EXTRACTION, BILATERAL Bilateral    COLONOSCOPY N/A 09/28/2015   Procedure: COLONOSCOPY;  Surgeon: Claudis PENNER Golda, MD;  [facility];  Service: Endoscopy;  Laterality: N/A;  955   CORONARY ANGIOPLASTY WITH STENT PLACEMENT  2000   CORONARY CTO INTERVENTION  07/15/2018   CORONARY CTO INTERVENTION N/A 07/15/2018   Procedure: CORONARY CTO INTERVENTION;  Surgeon: Jordan, Peter M, MD;  Location: [facility] INVASIVE CV LAB;  Service: Cardiovascular;  Laterality: N/A;   CYSTOSCOPY W/ URETERAL STENT PLACEMENT     ESOPHAGOGASTRODUODENOSCOPY (EGD) WITH PROPOFOL  N/A 10/16/2021   Procedure: ESOPHAGOGASTRODUODENOSCOPY (EGD) WITH PROPOFOL ;  Surgeon: Golda Claudis PENNER, MD;  [facility];  Service: Endoscopy;  Laterality: N/A;  11:05   ESOPHAGOGASTRODUODENOSCOPY (EGD) WITH PROPOFOL  N/A 10/16/2022  Procedure: ESOPHAGOGASTRODUODENOSCOPY (EGD) WITH PROPOFOL ;  Surgeon: Eartha Angelia Sieving, MD;  [facility];  Service: Gastroenterology;  Laterality: N/A;  1130 ASA 3   EYE SURGERY Right 2013   got hit by a limb & knocked my eye out   JOINT REPLACEMENT     LEFT HEART CATH AND CORONARY ANGIOGRAPHY N/A 06/15/2018   Procedure: LEFT HEART CATH AND CORONARY ANGIOGRAPHY;  Surgeon: Jordan, Peter M, MD;  Location: [facility] INVASIVE CV LAB;  Service: Cardiovascular;  Laterality: N/A;   LUMBAR DISC SURGERY  1992   MELANOMA EXCISION Right    inside my nose   TONSILLECTOMY     TOTAL HIP ARTHROPLASTY Right  07/29/2016   Procedure: TOTAL HIP ARTHROPLASTY;  Surgeon: Maude LELON Right, MD;  Location: [facility];  Service: Orthopedics;  Laterality: Right;    Current Outpatient Medications  Medication Sig Dispense Refill   aspirin  EC 81 MG tablet Take 1 tablet (81 mg total) by mouth in the morning. 30 tablet 11   bismuth subsalicylate (PEPTO BISMOL) 262 MG chewable tablet Chew 262-524 mg by mouth as needed for indigestion.     gabapentin (NEURONTIN) 300 MG capsule Take 300 mg by mouth 2 (two) times daily.     polyethylene glycol powder (GLYCOLAX /MIRALAX ) 17 GM/SCOOP powder Take 8.5 g by mouth daily. (Patient taking differently: Take 17 g by mouth daily. 1-2 times per day.) 255 g 0   atorvastatin  (LIPITOR) 10 MG tablet Take 1 tablet (10 mg total) by mouth daily. 90 tablet 3   furosemide  (LASIX ) 20 MG tablet Take 1 tablet (20 mg total) by mouth 2 (two) times daily. 60 tablet 5   midodrine  (PROAMATINE ) 10 MG tablet Take 1 tablet (10 mg total) by mouth 2 (two) times daily. 60 tablet 5   nitroGLYCERIN  (NITROSTAT ) 0.4 MG SL tablet Place 1 tablet (0.4 mg total) under the tongue every 5 (five) minutes as needed for chest pain (If no releif after 3rd dose proceed to ED or call 911). 25 tablet 2   pantoprazole  (PROTONIX ) 40 MG tablet Take 1 tablet (40 mg total) by mouth daily. 30 tablet 2   No current facility-administered medications for this visit.   Allergies:  Patient has no known allergies.   Social History: The patient  reports that he quit smoking about 53 years ago. His smoking use included cigarettes. He started smoking about 81 years ago. He has a 27 pack-year smoking history. He has been exposed to tobacco smoke. He has never used smokeless tobacco. He reports that he does not currently use alcohol. He reports that he does not use drugs.   Family History: The patient's family history includes Alzheimer's disease in his mother; Diabetes in his brother and maternal uncle; Diabetes Mellitus II in his brother.    ROS:  Please see the history of present illness. Otherwise, complete review of systems is positive for none  All other systems are reviewed and negative.   Physical Exam: VS:  BP 126/74   Pulse 84   Ht 6' 1 (1.854 m)   Wt 204 lb (92.5 kg)   SpO2 95%   BMI 26.91 kg/m , BMI Body mass index is 26.91 kg/m.  Wt Readings from Last 3 Encounters:  12/14/24 204 lb (92.5 kg)  12/02/23 196 lb 3.2 oz (89 kg)  07/16/23 188 lb 6.1 oz (85.5 kg)    General: Patient appears comfortable at rest. HEENT: Conjunctiva and lids normal, oropharynx clear with moist mucosa. Neck: Supple, no elevated JVP  or carotid bruits, no thyromegaly. Lungs: Clear to auscultation, nonlabored breathing at rest. Cardiac: Regular rate and rhythm, no S3 or significant systolic murmur, no pericardial rub. Abdomen: Soft, nontender, no hepatomegaly, bowel sounds present, no guarding or rebound. Extremities: No pitting edema, distal pulses 2+. Skin: Warm and dry. Musculoskeletal: No kyphosis. Neuropsychiatric: Alert and oriented x3, affect grossly appropriate.  Recent Labwork: No results found for requested labs within last 365 days.  No results found for: CHOL, TRIG, HDL, CHOLHDL, VLDL, LDLCALC, LDLDIRECT   Assessment and Plan:  # Chronic systolic heart failure, compensated - Compensated.  Denies having any DOE, orthopnea, PND or leg swelling. - Continue p.o. Lasix  20 mg twice daily. - Continue midodrine  10 mg twice daily. - GDMT uptitration limited due to soft blood pressures on midodrine . - Echocardiogram from 6/24 showed LVEF worsened from 45 to 50% in 2021 to 35 to 40% now.  - Patient has known LCx CTO and mid LAD 50% stenosis in 2019 that is currently medically managed.  No angina.  # Dizziness, resolved:  - 2-week event monitor from 5/24 showed 3.9% PAC burden, 3.6% PVC burden, 7 runs of NSVT (fastest lasting 7 beats and the longest lasting 9.7 seconds), 86 runs of SVT (fastest lasting 11 beats  and longest lasting 15.9 seconds).  No patient triggered events were noted.  - Unable to start rate controlling agents due to soft pressures requiring monitoring.  Continue midodrine  10 mg twice daily, encouraged p.o. hydration.  # CAD s/p RCA PCI in 2019 with residual Lcx CTO that was unable to be intervened due to technical difficulties and 50% mid LAD lesion with ICM LVEF 45 to 50%, currently angina free - Continue aspirin  81 mg once daily, atorvastatin  10 mg changes.  # HLD: Continue atorvastatin  10 mg nightly, goal LDL less than 70.  30-minute spent in reviewing prior medical records, more than 3 labs, discussion and documentation  Refilled medications.   Disposition:  Follow up 1 year  Signed, Havah Ammon Arleta Maywood, MD, 06/29/2023 12:03 PM

## 2025-01-27 ENCOUNTER — Encounter: Payer: Self-pay | Admitting: Internal Medicine
